# Patient Record
Sex: Male | Born: 1955 | Hispanic: No | Smoking: Current some day smoker
Health system: Southern US, Community
[De-identification: ages and names within clinical notes are randomized; demographics above are authoritative.]

## PROBLEM LIST (undated history)

## (undated) DIAGNOSIS — N189 Chronic kidney disease, unspecified: Secondary | ICD-10-CM

## (undated) DIAGNOSIS — C19 Malignant neoplasm of rectosigmoid junction: Secondary | ICD-10-CM

## (undated) DIAGNOSIS — Z87442 Personal history of urinary calculi: Secondary | ICD-10-CM

## (undated) HISTORY — DX: Chronic kidney disease, unspecified: N18.9

## (undated) HISTORY — PX: COLONOSCOPY: SHX174

---

## 2021-01-16 ENCOUNTER — Inpatient Hospital Stay (HOSPITAL_COMMUNITY)
Admission: EM | Admit: 2021-01-16 | Discharge: 2021-01-23 | DRG: 329 | Disposition: A | Discharge status: Home Health | Payer: Self-pay | Attending: Surgery | Admitting: Surgery

## 2021-01-16 ENCOUNTER — Emergency Department (HOSPITAL_COMMUNITY): Payer: Self-pay

## 2021-01-16 ENCOUNTER — Other Ambulatory Visit: Payer: Self-pay

## 2021-01-16 DIAGNOSIS — R030 Elevated blood-pressure reading, without diagnosis of hypertension: Secondary | ICD-10-CM

## 2021-01-16 DIAGNOSIS — K6389 Other specified diseases of intestine: Secondary | ICD-10-CM

## 2021-01-16 DIAGNOSIS — F17210 Nicotine dependence, cigarettes, uncomplicated: Secondary | ICD-10-CM | POA: Diagnosis present

## 2021-01-16 DIAGNOSIS — D72829 Elevated white blood cell count, unspecified: Secondary | ICD-10-CM

## 2021-01-16 DIAGNOSIS — Z20822 Contact with and (suspected) exposure to covid-19: Secondary | ICD-10-CM | POA: Diagnosis present

## 2021-01-16 DIAGNOSIS — K631 Perforation of intestine (nontraumatic): Secondary | ICD-10-CM | POA: Diagnosis present

## 2021-01-16 DIAGNOSIS — I10 Essential (primary) hypertension: Secondary | ICD-10-CM | POA: Diagnosis present

## 2021-01-16 DIAGNOSIS — N3289 Other specified disorders of bladder: Secondary | ICD-10-CM

## 2021-01-16 DIAGNOSIS — K760 Fatty (change of) liver, not elsewhere classified: Secondary | ICD-10-CM | POA: Diagnosis present

## 2021-01-16 DIAGNOSIS — R599 Enlarged lymph nodes, unspecified: Secondary | ICD-10-CM | POA: Diagnosis present

## 2021-01-16 DIAGNOSIS — E860 Dehydration: Secondary | ICD-10-CM | POA: Diagnosis present

## 2021-01-16 DIAGNOSIS — E876 Hypokalemia: Secondary | ICD-10-CM | POA: Diagnosis present

## 2021-01-16 DIAGNOSIS — R3129 Other microscopic hematuria: Secondary | ICD-10-CM | POA: Diagnosis present

## 2021-01-16 DIAGNOSIS — C19 Malignant neoplasm of rectosigmoid junction: Principal | ICD-10-CM | POA: Diagnosis present

## 2021-01-16 DIAGNOSIS — K5900 Constipation, unspecified: Secondary | ICD-10-CM | POA: Diagnosis present

## 2021-01-16 DIAGNOSIS — K651 Peritoneal abscess: Secondary | ICD-10-CM

## 2021-01-16 LAB — URINALYSIS, ROUTINE W REFLEX MICROSCOPIC
Bilirubin Urine: NEGATIVE
Glucose, UA: NEGATIVE mg/dL
Ketones, ur: 5 mg/dL — AB
Leukocytes,Ua: NEGATIVE
Nitrite: NEGATIVE
Protein, ur: NEGATIVE mg/dL
Specific Gravity, Urine: >1.046 — ABNORMAL HIGH (ref 1.005–1.030)
pH: 5 (ref 5.0–8.0)

## 2021-01-16 LAB — CBC WITH DIFFERENTIAL/PLATELET
Abs Immature Granulocytes: 0.05 10*3/uL (ref 0.00–0.07)
Basophils Absolute: 0 10*3/uL (ref 0.0–0.1)
Basophils Relative: 0 %
Eosinophils Absolute: 0.1 10*3/uL (ref 0.0–0.5)
Eosinophils Relative: 0 %
HCT: 35.1 % — ABNORMAL LOW (ref 39.0–52.0)
Hemoglobin: 11.3 g/dL — ABNORMAL LOW (ref 13.0–17.0)
Immature Granulocytes: 0 %
Lymphocytes Relative: 10 %
Lymphs Abs: 1.4 10*3/uL (ref 0.7–4.0)
MCH: 27.7 pg (ref 26.0–34.0)
MCHC: 32.2 g/dL (ref 30.0–36.0)
MCV: 86 fL (ref 80.0–100.0)
Monocytes Absolute: 0.7 10*3/uL (ref 0.1–1.0)
Monocytes Relative: 5 %
Neutro Abs: 11.5 10*3/uL — ABNORMAL HIGH (ref 1.7–7.7)
Neutrophils Relative %: 85 %
Platelets: 373 10*3/uL (ref 150–400)
RBC: 4.08 MIL/uL — ABNORMAL LOW (ref 4.22–5.81)
RDW: 12.5 % (ref 11.5–15.5)
WBC: 13.7 10*3/uL — ABNORMAL HIGH (ref 4.0–10.5)
nRBC: 0 % (ref 0.0–0.2)

## 2021-01-16 LAB — COMPREHENSIVE METABOLIC PANEL
ALT: 11 U/L (ref 0–44)
AST: 13 U/L — ABNORMAL LOW (ref 15–41)
Albumin: 3 g/dL — ABNORMAL LOW (ref 3.5–5.0)
Alkaline Phosphatase: 68 U/L (ref 38–126)
Anion gap: 12 (ref 5–15)
BUN: 14 mg/dL (ref 8–23)
CO2: 24 mmol/L (ref 22–32)
Calcium: 9.1 mg/dL (ref 8.9–10.3)
Chloride: 100 mmol/L (ref 98–111)
Creatinine, Ser: 1.06 mg/dL (ref 0.61–1.24)
GFR, Estimated: >60 mL/min (ref >60)
Glucose, Bld: 97 mg/dL (ref 70–99)
Potassium: 3.8 mmol/L (ref 3.5–5.1)
Sodium: 136 mmol/L (ref 135–145)
Total Bilirubin: 0.4 mg/dL (ref 0.3–1.2)
Total Protein: 6.7 g/dL (ref 6.5–8.1)

## 2021-01-16 LAB — LACTIC ACID, PLASMA
Lactic Acid, Venous: 0.8 mmol/L (ref 0.5–1.9)
Lactic Acid, Venous: 1.8 mmol/L (ref 0.5–1.9)
Lactic Acid, Venous: 0.9 mmol/L (ref 0.5–1.9)

## 2021-01-16 LAB — LIPASE, BLOOD: Lipase: 20 U/L (ref 11–51)

## 2021-01-16 MED ORDER — IOHEXOL 300 MG/ML  SOLN
100.0000 mL | Freq: Once | INTRAMUSCULAR | Status: AC | PRN
  Administered 2021-01-16: 100 mL via INTRAVENOUS

## 2021-01-16 MED ORDER — SODIUM CHLORIDE 0.9 % IV SOLN: INTRAVENOUS | Status: DC

## 2021-01-16 MED ORDER — PIPERACILLIN-TAZOBACTAM 3.375 G IVPB
3.3750 g | Freq: Three times a day (TID) | INTRAVENOUS | Status: DC
  Administered 2021-01-16 – 2021-01-23 (×21): 3.375 g via INTRAVENOUS
  Filled 2021-01-16 (×21): qty 50

## 2021-01-16 NOTE — ED Notes (Signed)
Pt ambulated to restroom without need of assistance and without difficulty. BM x1

## 2021-01-16 NOTE — ED Notes (Signed)
Patient transported to CT 

## 2021-01-16 NOTE — ED Provider Notes (Signed)
Emergency Medicine Provider Triage Evaluation Note  Curtis [name] , a 65 y.o. male  was evaluated in triage.  Pt complains of constipation x3-4 weeks.  On my examination, he reports that he is still able to pass stool, but is very loose and watery.  He states that he has been taking mag citrate and other stool softeners.  He urinates sitting down, so sometimes it is difficult for him to differentiate between whether or not he is urinating or passing loose stool.  He denies any fevers or chills, nausea or vomiting, or other symptoms.  He can still pas gas.  He has never had a colonoscopy.  He had plain films obtained yesterday and was encouraged to get a CT, but states that it interfered with time with his children.  Review of Systems  Positive: Constipation Negative: Fevers, N/V, history of malignancy.  Physical Exam  BP (!) 147/111   Pulse (!) 101   Temp 98.3 F (36.8 C) (Oral)   Resp 16   SpO2 99%  Gen:   Awake, no distress   Resp:  Normal effort  MSK:   Moves extremities without difficulty  Other:  Abdomen is soft.  Medical Decision Making  Medically screening exam initiated at 2:50 PM.  Appropriate orders placed.  Curtis [name] was informed that the remainder of the evaluation will be completed by another provider, this initial triage assessment does not replace that evaluation, and the importance of remaining in the ED until their evaluation is complete.    Corena Herter, PA-C 01/16/21 1450    Quintella Reichert, MD 01/16/21 [phone]

## 2021-01-16 NOTE — ED Notes (Signed)
Attempted to call report, advised by 6N that they will call back shortly. Call back number provided.

## 2021-01-16 NOTE — ED Provider Notes (Signed)
[facility] EMERGENCY DEPARTMENT Provider Note   CSN: [id number] Arrival date & time: 01/16/21  1440     History No chief complaint on file.   Curtis [name] is a 65 y.o. male.  Presents to ER with concern for constipation, abdominal pain.  Patient reports over the past few weeks he has been having constipation, states that he is having hard time having regular bowel movements.  Is having multiple frequent loose small watery stools.  Has tried mag citrate and some other stool softeners without significant improvement.  No pain with urination, is having a harder time urinating while standing but has no difficulty urinating while sitting.  Has not had any CT imaging of his abdomen since having this concern.  Does have some intermittent abdominal discomfort but no pain at present.    HPI     No past medical history on file.  Patient Active Problem List   Diagnosis Date Noted   Bowel perforation ([place]) 01/16/2021   Intra-abdominal abscess ([place]) 01/16/2021   Elevated blood pressure reading 01/16/2021   Leukocytosis 01/16/2021   Hepatic steatosis 01/16/2021   Bladder wall thickening 01/16/2021   Microscopic hematuria 01/16/2021    No family history on file.     Home Medications Prior to Admission medications   Medication Sig Start Date End Date Taking? Authorizing Provider  acetaminophen (TYLENOL) 500 MG tablet Take 1,000 mg by mouth daily.   Yes [provider]    Allergies    Patient has no known allergies.  Review of Systems   Review of Systems  Constitutional:  Negative for chills and fever.  HENT:  Negative for ear pain and sore throat.   Eyes:  Negative for pain and visual disturbance.  Respiratory:  Negative for cough and shortness of breath.   Cardiovascular:  Negative for chest pain and palpitations.  Gastrointestinal:  Positive for abdominal distention, abdominal pain, constipation and diarrhea. Negative for vomiting.  Genitourinary:   Negative for dysuria and hematuria.  Musculoskeletal:  Negative for arthralgias and back pain.  Skin:  Negative for color change and rash.  Neurological:  Negative for seizures and syncope.  All other systems reviewed and are negative.  Physical Exam Updated Vital Signs BP (!) 151/81   Pulse 85   Temp 99.3 F (37.4 C) (Oral)   Resp (!) 24   Ht 5\' 10"  (1.778 m)   Wt 91.2 kg   SpO2 96%   BMI 28.84 kg/m   Physical Exam Vitals and nursing note reviewed.  Constitutional:      Appearance: He is well-developed.  HENT:     Head: Normocephalic and atraumatic.  Eyes:     Conjunctiva/sclera: Conjunctivae normal.  Cardiovascular:     Rate and Rhythm: Normal rate and regular rhythm.     Heart sounds: No murmur heard. Pulmonary:     Effort: Pulmonary effort is normal. No respiratory distress.     Breath sounds: Normal breath sounds.  Abdominal:     Palpations: Abdomen is soft.     Comments: Mild tenderness in the right lower and left lower quadrants  Musculoskeletal:     Cervical back: Neck supple.  Skin:    General: Skin is warm and dry.  Neurological:     General: No focal deficit present.     Mental Status: He is alert.  Psychiatric:        Mood and Affect: Mood normal.        Behavior: Behavior normal.  ED Results / Procedures / Treatments   Labs (all labs ordered are listed, but only abnormal results are displayed) Labs Reviewed  CBC WITH DIFFERENTIAL/PLATELET - Abnormal; Notable for the following components:      Result Value   WBC 13.7 (*)    RBC 4.08 (*)    Hemoglobin 11.3 (*)    HCT 35.1 (*)    Neutro Abs 11.5 (*)    All other components within normal limits  COMPREHENSIVE METABOLIC PANEL - Abnormal; Notable for the following components:   Albumin 3.0 (*)    AST 13 (*)    All other components within normal limits  URINALYSIS, ROUTINE W REFLEX MICROSCOPIC - Abnormal; Notable for the following components:   Specific Gravity, Urine >1.046 (*)    Hgb urine  dipstick SMALL (*)    Ketones, ur 5 (*)    Bacteria, UA RARE (*)    All other components within normal limits  SARS CORONAVIRUS 2 (TAT 6-24 HRS)  URINE CULTURE  LIPASE, BLOOD  LACTIC ACID, PLASMA  LACTIC ACID, PLASMA    EKG None  Radiology CT ABDOMEN PELVIS W CONTRAST  Result Date: 01/16/2021 CLINICAL DATA:  Abdominal pain EXAM: CT ABDOMEN AND PELVIS WITH CONTRAST TECHNIQUE: Multidetector CT imaging of the abdomen and pelvis was performed using the standard protocol following bolus administration of intravenous contrast. CONTRAST:  144mL OMNIPAQUE IOHEXOL 300 MG/ML  SOLN COMPARISON:  None. FINDINGS: Lower chest: Linear scarring atelectasis in the posterior right lung base. Lung bases otherwise clear. Normal heart size. No pericardial effusion. Hepatobiliary: Diffuse hepatic hypoattenuation compatible with hepatic steatosis. Smooth surface contour. No concerning focal liver lesion. Focal hypoattenuation along the falciform ligament likely focal fatty infiltration. Normal gallbladder and biliary tree without visible calcified gallstones or ductal dilatation. Pancreas: No pancreatic ductal dilatation or surrounding inflammatory changes. Spleen: Normal in size. No concerning splenic lesions. Adrenals/Urinary Tract: Normal adrenal glands. Kidneys are normally located with symmetric enhancement and excretion. No suspicious renal lesion,. Punctate nonobstructing calculi are seen bilaterally. Some mild asymmetric thickening of the posterior bladder wall is favored to be reactive to the inflammatory process in the pelvis detailed below. Stomach/Bowel: Distal esophagus, stomach and duodenal sweep are unremarkable. No small bowel wall thickening or dilatation. No evidence of obstruction. A normal appendix is visualized. Proximal colon is unremarkable. There are distal colonic diverticula. There is an irregularly thickened and narrowed appearance of the rectosigmoid with surrounding multiloculated rim enhancing  fluid collection extending towards and along the right pelvic sidewall with maximum conglomerate dimensions of 5.5 x 6.0 x 8.2 cm in transverse by AP by craniocaudal dimensions. Admixed air and fluid contained within this heterogeneous collection. Extensive surrounding phlegmon and inflammatory change. Fluid-filled appearance of the rectum. Vascular/Lymphatic: Atherosclerotic calcifications within the abdominal aorta and branch vessels. No aneurysm or ectasia. Few scattered prominent nodes are seen in the pelvis, possibly reactive. No other conspicuous or pathologically enlarged nodes in the abdomen or pelvis. Reproductive: Stranding and thickening from the rectosigmoid process above extends to the seminal vesicles. Few eccentric calcifications of the prostate. Other: Air and fluid containing collections extending along the rectosigmoid and right pelvic sidewall, as above. Surrounding fluid and phlegmon. No other free air or fluid is seen. No bowel containing hernia. Bilateral fat containing inguinal hernias. Musculoskeletal: Multilevel degenerative changes are present in the imaged portions of the spine. Additional degenerative changes in the hips and pelvis. No acute osseous abnormality or suspicious osseous lesion. IMPRESSION: 1. Markedly irregular segmental thickening narrowing of the rectosigmoid  colon with extensive surrounding phlegmon as well as a large air and fluid containing collection extending towards and along the pelvic sidewall. While this could reflect a perforated diverticulitis with abscess formation appearance is highly worrisome for an underlying mass lesion with perforation given the irregularity of the wall thickening. Surrounding adenopathy may be reactive or metastatic. 2. Suspect reactive thickening of the posterior bladder wall and seminal vesicles. Could correlate with urinalysis as warranted. 3. Hepatic steatosis 4.  Aortic Atherosclerosis (ICD10-I70.0). Electronically Signed   By: Lovena Le M.D.   On: 01/16/2021 19:06    Procedures Procedures   Medications Ordered in ED Medications  piperacillin-tazobactam (ZOSYN) IVPB 3.375 g (3.375 g Intravenous New Bag/Given 01/16/21 2004)  iohexol (OMNIPAQUE) 300 MG/ML solution 100 mL (100 mLs Intravenous Contrast Given 01/16/21 1828)    ED Course  I have reviewed the triage vital signs and the nursing notes.  Pertinent labs & imaging results that were available during my care of the patient were reviewed by me and considered in my medical decision making (see chart for details).    MDM Rules/Calculators/A&P                          65 year old male presenting to ER with concern for abdominal pain.  On exam well-appearing but did note some tenderness in lower abdomen.  Labs noted for leukocytosis.  CT scan concerning for possible bowel perforation, perforated diverticulitis with abscess formation versus mass lesion with perforation.  Discussed with general surgery, Dr. Grandville Silos.  Agrees with antibiotics and medicine admission.  He will see as consult..  Discussed with Dr. Juleen China.  She will admit as primary.  Started Zosyn for now.   Final Clinical Impression(s) / ED Diagnoses Final diagnoses:  Bowel perforation ([place])  Intra-abdominal abscess [facility])    Rx / DC Orders ED Discharge Orders     None        Lucrezia Starch, MD 01/16/21 2057

## 2021-01-16 NOTE — H&P (Signed)
History and Physical        Hospital Admission Note Date: 01/16/2021  Patient name: Curtis [name] Medical record number: [id number] Date of birth: [birth year] Age: 65 y.o. Gender: male  PCP: Pcp, No    Patient coming from: Home   I have reviewed all records in the [facility].    Chief Complaint:  Constipation, Abdominal Pain   HPI: Curtis [name] is a 65 y.o. male with reportedly no significant PMH who presents for constipation, abdominal pain. This has been occurring for several weeks. Pain has been intermittent and generalized, vague. Has tried mag citrate and some other stool softeners without significant improvement. Has also been having periods of frequent loose small watery stools. Has not been seen for this complaint. No imaging has been done. No history of diverticulosis. Has never had a colonoscopy. Afebrile.    ED work-up/course:    65 year old male presenting to ER with concern for abdominal pain.  On exam well-appearing but did note some tenderness in lower abdomen.  Labs noted for leukocytosis.  CT scan concerning for possible bowel perforation, perforated diverticulitis with abscess formation versus mass lesion with perforation.  Discussed with general surgery, Dr. Grandville Silos.  Agrees with antibiotics and medicine admission.  He will see as consult..  Discussed with Dr. Juleen China.  She will admit as primary.  Started Zosyn for now.  Review of Systems: Positives marked in 'bold' Constitutional: Denies fever, chills, diaphoresis, poor appetite and fatigue.  HEENT: Denies photophobia, eye pain, redness, hearing loss, ear pain, congestion, sore throat, rhinorrhea, sneezing, mouth sores, trouble swallowing, neck pain, neck stiffness and tinnitus.   Respiratory: Denies SOB, DOE, cough, chest tightness,  and wheezing.   Cardiovascular: Denies chest pain, palpitations and leg swelling.  Gastrointestinal: Denies nausea,  vomiting, abdominal pain, diarrhea, constipation, blood in stool and abdominal distention.  Genitourinary: Denies dysuria, urgency, frequency, hematuria, flank pain and difficulty urinating.  Musculoskeletal: Denies myalgias, back pain, joint swelling, arthralgias and gait problem.  Skin: Denies pallor, rash and wound.  Neurological: Denies dizziness, seizures, syncope, weakness, light-headedness, numbness and headaches.  Hematological: Denies adenopathy. Easy bruising, personal or family bleeding history  Psychiatric/Behavioral: Denies suicidal ideation, mood changes, confusion, nervousness, sleep disturbance and agitation  Past Medical History: No past medical history on file.   Medications: Prior to Admission medications   Medication Sig Start Date End Date Taking? Authorizing Provider  acetaminophen (TYLENOL) 500 MG tablet Take 1,000 mg by mouth daily.   Yes [provider]    Allergies:  No Known Allergies  Social History:  has no history on file for tobacco use, alcohol use, and drug use.  Family History: No family history on file.  Physical Exam: Blood pressure (!) 151/81, pulse 85, temperature 99.3 F (37.4 C), temperature source Oral, resp. rate (!) 24, height 5\' 10"  (1.778 m), weight 91.2 kg, SpO2 96 %. General: Alert, awake, oriented x3, in no acute distress. Eyes: pink conjunctiva,anicteric sclera, pupils equal and reactive to light and accomodation, HEENT: normocephalic, atraumatic, oropharynx clear Neck: supple, no masses or lymphadenopathy, no goiter, no bruits, no JVD CVS: Regular rate and rhythm, without murmurs, rubs or gallops. No lower extremity edema Resp :  Clear to auscultation bilaterally, no wheezing, rales or rhonchi. GI : Soft, mild tenderness in lower abdomen. No rebound or guarding.  Musculoskeletal: No clubbing or cyanosis, positive pedal pulses. No contracture. ROM intact  Neuro: Grossly intact, no focal neurological deficits, strength 5/5  upper and lower extremities bilaterally Psych: alert and oriented x 3, normal mood and affect Skin: no rashes or lesions, warm and dry   LABS on Admission: I have personally reviewed all the labs and imagings below    Basic Metabolic Panel: Recent Labs  Lab 01/16/21 1444  NA 136  K 3.8  CL 100  CO2 24  GLUCOSE 97  BUN 14  CREATININE 1.06  CALCIUM 9.1   Liver Function Tests: Recent Labs  Lab 01/16/21 1444  AST 13*  ALT 11  ALKPHOS 68  BILITOT 0.4  PROT 6.7  ALBUMIN 3.0*   Recent Labs  Lab 01/16/21 1515  LIPASE 20   No results for input(s): AMMONIA in the last 168 hours. CBC: Recent Labs  Lab 01/16/21 1444  WBC 13.7*  NEUTROABS 11.5*  HGB 11.3*  HCT 35.1*  MCV 86.0  PLT 373   Cardiac Enzymes: No results for input(s): CKTOTAL, CKMB, CKMBINDEX, TROPONINI in the last 168 hours. BNP: Invalid input(s): POCBNP CBG: No results for input(s): GLUCAP in the last 168 hours.  Radiological Exams on Admission:  CT ABDOMEN PELVIS W CONTRAST  Result Date: 01/16/2021 CLINICAL DATA:  Abdominal pain EXAM: CT ABDOMEN AND PELVIS WITH CONTRAST TECHNIQUE: Multidetector CT imaging of the abdomen and pelvis was performed using the standard protocol following bolus administration of intravenous contrast. CONTRAST:  162mL OMNIPAQUE IOHEXOL 300 MG/ML  SOLN COMPARISON:  None. FINDINGS: Lower chest: Linear scarring atelectasis in the posterior right lung base. Lung bases otherwise clear. Normal heart size. No pericardial effusion. Hepatobiliary: Diffuse hepatic hypoattenuation compatible with hepatic steatosis. Smooth surface contour. No concerning focal liver lesion. Focal hypoattenuation along the falciform ligament likely focal fatty infiltration. Normal gallbladder and biliary tree without visible calcified gallstones or ductal dilatation. Pancreas: No pancreatic ductal dilatation or surrounding inflammatory changes. Spleen: Normal in size. No concerning splenic lesions.  Adrenals/Urinary Tract: Normal adrenal glands. Kidneys are normally located with symmetric enhancement and excretion. No suspicious renal lesion,. Punctate nonobstructing calculi are seen bilaterally. Some mild asymmetric thickening of the posterior bladder wall is favored to be reactive to the inflammatory process in the pelvis detailed below. Stomach/Bowel: Distal esophagus, stomach and duodenal sweep are unremarkable. No small bowel wall thickening or dilatation. No evidence of obstruction. A normal appendix is visualized. Proximal colon is unremarkable. There are distal colonic diverticula. There is an irregularly thickened and narrowed appearance of the rectosigmoid with surrounding multiloculated rim enhancing fluid collection extending towards and along the right pelvic sidewall with maximum conglomerate dimensions of 5.5 x 6.0 x 8.2 cm in transverse by AP by craniocaudal dimensions. Admixed air and fluid contained within this heterogeneous collection. Extensive surrounding phlegmon and inflammatory change. Fluid-filled appearance of the rectum. Vascular/Lymphatic: Atherosclerotic calcifications within the abdominal aorta and branch vessels. No aneurysm or ectasia. Few scattered prominent nodes are seen in the pelvis, possibly reactive. No other conspicuous or pathologically enlarged nodes in the abdomen or pelvis. Reproductive: Stranding and thickening from the rectosigmoid process above extends to the seminal vesicles. Few eccentric calcifications of the prostate. Other: Air and fluid containing collections extending along the rectosigmoid and right pelvic sidewall, as above. Surrounding fluid and phlegmon. No other free air or fluid is seen. No bowel containing hernia.  Bilateral fat containing inguinal hernias. Musculoskeletal: Multilevel degenerative changes are present in the imaged portions of the spine. Additional degenerative changes in the hips and pelvis. No acute osseous abnormality or suspicious  osseous lesion. IMPRESSION: 1. Markedly irregular segmental thickening narrowing of the rectosigmoid colon with extensive surrounding phlegmon as well as a large air and fluid containing collection extending towards and along the pelvic sidewall. While this could reflect a perforated diverticulitis with abscess formation appearance is highly worrisome for an underlying mass lesion with perforation given the irregularity of the wall thickening. Surrounding adenopathy may be reactive or metastatic. 2. Suspect reactive thickening of the posterior bladder wall and seminal vesicles. Could correlate with urinalysis as warranted. 3. Hepatic steatosis 4.  Aortic Atherosclerosis (ICD10-I70.0). Electronically Signed   By: Lovena Le M.D.   On: 01/16/2021 19:06     Assessment/Plan Active Problems:   Bowel perforation ([facility])   Intra-abdominal abscess ([facility])   Elevated blood pressure reading   Leukocytosis   Hepatic steatosis   Bladder wall thickening   Microscopic hematuria  Bowel Perforation with Intra-Abdominal Abscess versus Underlying Mass Lesion  CT abdomen read: Markedly irregular segmental thickening narrowing of the rectosigmoid colon with extensive surrounding phlegmon as well as a large air and fluid containing collection extending towards and along the pelvic sidewall. While this could reflect a perforated diverticulitis with abscess formation appearance is highly worrisome for an underlying mass lesion with perforation given the irregularity of the wall thickening. Surrounding adenopathy may be reactive or metastatic. Patient is hemodynamically stable. Mild leukocytosis to 13. Reports no history of bowel disease. No history of malignancy.  -admit to [facility] -continue Zosyn ordered by ED provider  -Gen Surg has been consulted, awaiting recs...may warrant IR involvement  -obtain lactic acid  -monitor CBC  -NS at 125 cc/hr   Bladder Wall Thickening/Microscopic Hematuria  Noted on CT abdomen  thought to be reactive thickening. Patient denies any urinary concerns other than difficulty voiding while standing. UA appears to have evidence of mild dehydration. Also has rare bacteria, 6-10 RBCs. Will investigate further for underlying infection. This may be related to undiagnosed BPH. May have some degree of bleeding related to reaction to above findings. No bladder masses noted on imaging.  -will obtain urine culture   Elevated BP without diagnosis of HTN  Denies h/o elevated BP. Anxiety over diagnosis and admission could certainly be contributing.  -will need to monitor  -not currently in range where would need medications to lower immediately    Hepatic Steatosis  Incidental finding on imaging. LFTs normal.  -outpatient management regarding diet and lifestyle changes     DVT prophylaxis: SCDs pending plan about any possible procedure   CODE STATUS: FULL   Consults called: Gen Surg   Admission status: Inpatient   The medical decision making on this patient was of high complexity and the patient is at high risk for clinical deterioration, therefore this is a level 3 admission.  Severity of Illness:      The appropriate patient status for this patient is INPATIENT. Inpatient status is judged to be reasonable and necessary in order to provide the required intensity of service to ensure the patient's safety. The patient's presenting symptoms, physical exam findings, and initial radiographic and laboratory data in the context of their chronic comorbidities is felt to place them at high risk for further clinical deterioration. Furthermore, it is not anticipated that the patient will be medically stable for discharge from the hospital within 2 midnights  of admission. The following factors support the patient status of inpatient.   " The patient's presenting symptoms include abdominal pain, diarrhea, constipation. " The worrisome physical exam findings include TTP to lower  abdomen. " The initial radiographic and laboratory data are worrisome because of CT abdomen with bowel perforation with underlying abscess or possible mass, leukocytosis. " The chronic co-morbidities include none.   * I certify that at the point of admission it is my clinical judgment that the patient will require inpatient hospital care spanning beyond 2 midnights from the point of admission due to high intensity of service, high risk for further deterioration and high frequency of surveillance required.*   Time Spent on Admission: 42 minutes      Melina Schools D.O.  [facility] 01/16/2021, 9:04 PM

## 2021-01-16 NOTE — Consult Note (Signed)
Reason for Consult:colon perforation, possible mass Referring Physician: [name] is an 65 y.o. male.  HPI: 66yo M presented to the ED complaining of 3-week history of obstipation.  He has been having frequent small liquid bowel movements but only occasional regular bowel movements.  No blood in his stool.  No significant abdominal pain.  He has never had a colonoscopy.  Work-up in emergency department showed leukocytosis of 13,700.  CT scan of the abdomen and pelvis shows sigmoid colon perforation with abscess.  This possibly represents a perforated tumor.  We are asked to see him for surgical management.  No past medical history on file.  No family history on file.  Social History:  has no history on file for tobacco use, alcohol use, and drug use.  Allergies: No Known Allergies  Medications: I have reviewed the patient's current medications.  Results for orders placed or performed during the hospital encounter of 01/16/21 (from the past 48 hour(s))  CBC with Differential     Status: Abnormal   Collection Time: 01/16/21  2:44 PM  Result Value Ref Range   WBC 13.7 (H) 4.0 - 10.5 K/uL   RBC 4.08 (L) 4.22 - 5.81 MIL/uL   Hemoglobin 11.3 (L) 13.0 - 17.0 g/dL   HCT 35.1 (L) 39.0 - 52.0 %   MCV 86.0 80.0 - 100.0 fL   MCH 27.7 26.0 - 34.0 pg   MCHC 32.2 30.0 - 36.0 g/dL   RDW 12.5 11.5 - 15.5 %   Platelets 373 150 - 400 K/uL   nRBC 0.0 0.0 - 0.2 %   Neutrophils Relative % 85 %   Neutro Abs 11.5 (H) 1.7 - 7.7 K/uL   Lymphocytes Relative 10 %   Lymphs Abs 1.4 0.7 - 4.0 K/uL   Monocytes Relative 5 %   Monocytes Absolute 0.7 0.1 - 1.0 K/uL   Eosinophils Relative 0 %   Eosinophils Absolute 0.1 0.0 - 0.5 K/uL   Basophils Relative 0 %   Basophils Absolute 0.0 0.0 - 0.1 K/uL   Immature Granulocytes 0 %   Abs Immature Granulocytes 0.05 0.00 - 0.07 K/uL    Comment: Performed at [facility], [address] [place], [place] [address]  Comprehensive metabolic panel      Status: Abnormal   Collection Time: 01/16/21  2:44 PM  Result Value Ref Range   Sodium 136 135 - 145 mmol/L   Potassium 3.8 3.5 - 5.1 mmol/L   Chloride 100 98 - 111 mmol/L   CO2 24 22 - 32 mmol/L   Glucose, Bld 97 70 - 99 mg/dL    Comment: Glucose reference range applies only to samples taken after fasting for at least 8 hours.   BUN 14 8 - 23 mg/dL   Creatinine, Ser 1.06 0.61 - 1.24 mg/dL   Calcium 9.1 8.9 - 10.3 mg/dL   Total Protein 6.7 6.5 - 8.1 g/dL   Albumin 3.0 (L) 3.5 - 5.0 g/dL   AST 13 (L) 15 - 41 U/L   ALT 11 0 - 44 U/L   Alkaline Phosphatase 68 38 - 126 U/L   Total Bilirubin 0.4 0.3 - 1.2 mg/dL   GFR, Estimated >60 >60 mL/min    Comment: (NOTE) Calculated using the CKD-EPI Creatinine Equation (2021)    Anion gap 12 5 - 15    Comment: Performed at [facility] [address], [place], [place] [address]  Lipase, blood     Status:  None   Collection Time: 01/16/21  3:15 PM  Result Value Ref Range   Lipase 20 11 - 51 U/L    Comment: Performed at [facility], [place] [address] [place], [place] [address]  Urinalysis, Routine w reflex microscopic Urine, Clean Catch     Status: Abnormal   Collection Time: 01/16/21  6:55 PM  Result Value Ref Range   Color, Urine YELLOW YELLOW   APPearance CLEAR CLEAR   Specific Gravity, Urine >1.046 (H) 1.005 - 1.030   pH 5.0 5.0 - 8.0   Glucose, UA NEGATIVE NEGATIVE mg/dL   Hgb urine dipstick SMALL (A) NEGATIVE   Bilirubin Urine NEGATIVE NEGATIVE   Ketones, ur 5 (A) NEGATIVE mg/dL   Protein, ur NEGATIVE NEGATIVE mg/dL   Nitrite NEGATIVE NEGATIVE   Leukocytes,Ua NEGATIVE NEGATIVE   RBC / HPF 6-10 0 - 5 RBC/hpf   WBC, UA 0-5 0 - 5 WBC/hpf   Bacteria, UA RARE (A) NONE SEEN   Squamous Epithelial / LPF 0-5 0 - 5   Mucus PRESENT     Comment: Performed at [facility], [address] [place], [place] [address]  Lactic acid, plasma     Status: None   Collection Time: 01/16/21  7:49 PM  Result Value Ref Range   Lactic  Acid, Venous 0.8 0.5 - 1.9 mmol/L    Comment: Performed at [facility] [address], [place], [place] [address]    CT ABDOMEN PELVIS W CONTRAST  Result Date: 01/16/2021 CLINICAL DATA:  Abdominal pain EXAM: CT ABDOMEN AND PELVIS WITH CONTRAST TECHNIQUE: Multidetector CT imaging of the abdomen and pelvis was performed using the standard protocol following bolus administration of intravenous contrast. CONTRAST:  13mL OMNIPAQUE IOHEXOL 300 MG/ML  SOLN COMPARISON:  None. FINDINGS: Lower chest: Linear scarring atelectasis in the posterior right lung base. Lung bases otherwise clear. Normal heart size. No pericardial effusion. Hepatobiliary: Diffuse hepatic hypoattenuation compatible with hepatic steatosis. Smooth surface contour. No concerning focal liver lesion. Focal hypoattenuation along the falciform ligament likely focal fatty infiltration. Normal gallbladder and biliary tree without visible calcified gallstones or ductal dilatation. Pancreas: No pancreatic ductal dilatation or surrounding inflammatory changes. Spleen: Normal in size. No concerning splenic lesions. Adrenals/Urinary Tract: Normal adrenal glands. Kidneys are normally located with symmetric enhancement and excretion. No suspicious renal lesion,. Punctate nonobstructing calculi are seen bilaterally. Some mild asymmetric thickening of the posterior bladder wall is favored to be reactive to the inflammatory process in the pelvis detailed below. Stomach/Bowel: Distal esophagus, stomach and duodenal sweep are unremarkable. No small bowel wall thickening or dilatation. No evidence of obstruction. A normal appendix is visualized. Proximal colon is unremarkable. There are distal colonic diverticula. There is an irregularly thickened and narrowed appearance of the rectosigmoid with surrounding multiloculated rim enhancing fluid collection extending towards and along the right pelvic sidewall with maximum conglomerate dimensions of 5.5 x 6.0 x  8.2 cm in transverse by AP by craniocaudal dimensions. Admixed air and fluid contained within this heterogeneous collection. Extensive surrounding phlegmon and inflammatory change. Fluid-filled appearance of the rectum. Vascular/Lymphatic: Atherosclerotic calcifications within the abdominal aorta and branch vessels. No aneurysm or ectasia. Few scattered prominent nodes are seen in the pelvis, possibly reactive. No other conspicuous or pathologically enlarged nodes in the abdomen or pelvis. Reproductive: Stranding and thickening from the rectosigmoid process above extends to the seminal vesicles. Few eccentric calcifications of the prostate. Other: Air and fluid containing collections extending along the rectosigmoid and right  pelvic sidewall, as above. Surrounding fluid and phlegmon. No other free air or fluid is seen. No bowel containing hernia. Bilateral fat containing inguinal hernias. Musculoskeletal: Multilevel degenerative changes are present in the imaged portions of the spine. Additional degenerative changes in the hips and pelvis. No acute osseous abnormality or suspicious osseous lesion. IMPRESSION: 1. Markedly irregular segmental thickening narrowing of the rectosigmoid colon with extensive surrounding phlegmon as well as a large air and fluid containing collection extending towards and along the pelvic sidewall. While this could reflect a perforated diverticulitis with abscess formation appearance is highly worrisome for an underlying mass lesion with perforation given the irregularity of the wall thickening. Surrounding adenopathy may be reactive or metastatic. 2. Suspect reactive thickening of the posterior bladder wall and seminal vesicles. Could correlate with urinalysis as warranted. 3. Hepatic steatosis 4.  Aortic Atherosclerosis (ICD10-I70.0). Electronically Signed   By: Lovena Le M.D.   On: 01/16/2021 19:06    Review of Systems  Constitutional:  Negative for activity change.  HENT:  Negative.    Eyes: Negative.   Respiratory: Negative.    Cardiovascular: Negative.   Gastrointestinal:  Positive for constipation and diarrhea. Negative for blood in stool, nausea and vomiting.  Endocrine: Negative.   Genitourinary: Negative.   Musculoskeletal: Negative.   Allergic/Immunologic: Negative.   Neurological: Negative.   Hematological: Negative.   Psychiatric/Behavioral: Negative.    Blood pressure (!) 156/92, pulse 84, temperature 98.2 F (36.8 C), temperature source Oral, resp. rate (!) 24, height 5\' 10"  (1.778 m), weight 91.2 kg, SpO2 100 %. Physical Exam Constitutional:      Appearance: Normal appearance.  HENT:     Head: Normocephalic and atraumatic.     Right Ear: External ear normal.     Left Ear: External ear normal.     Nose: Nose normal.  Eyes:     General: No scleral icterus.    Extraocular Movements: Extraocular movements intact.     Pupils: Pupils are equal, round, and reactive to light.  Cardiovascular:     Rate and Rhythm: Normal rate and regular rhythm.     Pulses: Normal pulses.     Heart sounds: Normal heart sounds.  Pulmonary:     Effort: Pulmonary effort is normal.     Breath sounds: Normal breath sounds. No wheezing or rhonchi.  Abdominal:     General: Abdomen is flat. There is no distension.     Palpations: Abdomen is soft.     Tenderness: There is abdominal tenderness. There is no guarding or rebound.     Comments: Some suprapubic tenderness but no peritonitis  Musculoskeletal:        General: No swelling. Normal range of motion.     Cervical back: Normal range of motion.  Skin:    General: Skin is warm and dry.     Capillary Refill: Capillary refill takes less than 2 seconds.  Neurological:     Mental Status: He is alert and oriented to person, place, and time.  Psychiatric:        Mood and Affect: Mood normal.    Assessment/Plan: Perforated sigmoid colon with abscess -this is either a perforated tumor or perforated diverticulitis.   We will check CEA and CA125.  Agree with Zosyn and bowel rest.  It is likely he will need surgery this admission but we will discuss further.  We will follow closely.  Curtis [name] 01/16/2021, 9:37 PM

## 2021-01-16 NOTE — ED Triage Notes (Signed)
Pt here from home with c/o abd pain and constipation time 3 to 4 weeks , has tried some otc meds with minimal relief

## 2021-01-17 ENCOUNTER — Encounter (HOSPITAL_COMMUNITY): Payer: Self-pay | Admitting: Internal Medicine

## 2021-01-17 LAB — CBC
HCT: 33.7 % — ABNORMAL LOW (ref 39.0–52.0)
Hemoglobin: 10.9 g/dL — ABNORMAL LOW (ref 13.0–17.0)
MCH: 28.2 pg (ref 26.0–34.0)
MCHC: 32.3 g/dL (ref 30.0–36.0)
MCV: 87.3 fL (ref 80.0–100.0)
Platelets: 408 10*3/uL — ABNORMAL HIGH (ref 150–400)
RBC: 3.86 MIL/uL — ABNORMAL LOW (ref 4.22–5.81)
RDW: 12.8 % (ref 11.5–15.5)
WBC: 14.1 10*3/uL — ABNORMAL HIGH (ref 4.0–10.5)
nRBC: 0 % (ref 0.0–0.2)

## 2021-01-17 LAB — HIV ANTIBODY (ROUTINE TESTING W REFLEX): HIV Screen 4th Generation wRfx: NONREACTIVE

## 2021-01-17 LAB — BASIC METABOLIC PANEL
Anion gap: 11 (ref 5–15)
BUN: 13 mg/dL (ref 8–23)
CO2: 23 mmol/L (ref 22–32)
Calcium: 8.8 mg/dL — ABNORMAL LOW (ref 8.9–10.3)
Chloride: 103 mmol/L (ref 98–111)
Creatinine, Ser: 0.98 mg/dL (ref 0.61–1.24)
GFR, Estimated: >60 mL/min (ref >60)
Glucose, Bld: 107 mg/dL — ABNORMAL HIGH (ref 70–99)
Potassium: 3.6 mmol/L (ref 3.5–5.1)
Sodium: 137 mmol/L (ref 135–145)

## 2021-01-17 LAB — SARS CORONAVIRUS 2 (TAT 6-24 HRS): SARS Coronavirus 2: NEGATIVE

## 2021-01-17 LAB — LACTIC ACID, PLASMA: Lactic Acid, Venous: 1.1 mmol/L (ref 0.5–1.9)

## 2021-01-17 MED ORDER — CLONIDINE HCL 0.1 MG PO TABS
0.1000 mg | ORAL_TABLET | Freq: Two times a day (BID) | ORAL | Status: DC | PRN
  Administered 2021-01-19: 0.1 mg via ORAL
  Filled 2021-01-17: qty 1

## 2021-01-17 MED ORDER — MORPHINE SULFATE (PF) 2 MG/ML IV SOLN
2.0000 mg | Freq: Once | INTRAVENOUS | Status: AC
  Administered 2021-01-17: 2 mg via INTRAVENOUS
  Filled 2021-01-17: qty 1

## 2021-01-17 NOTE — Hospital Course (Signed)
65 year old with no past medical history, never colonoscopy Constipation obstipation X 21 days or so-trialed various purgatives without improvement with intermittent minimal watery stools CT abdomen pelvis = intra-abdominal abscess question of underlying mass has been raised given irregularity of the wall thickening with surrounding adenopathy  Started on Zosyn-saline 125 cc/H General surgery consulted-feels this is perforated tumor/perforated diverticulitis and has ordered tumor panel to best determine next steps

## 2021-01-17 NOTE — Progress Notes (Signed)
Progress Note     Subjective: CC: Feeling better this AM.  No abdominal pain at rest.  Continues to have small liquid Bms today. He denies nausea or emesis  Objective: Vital signs in last 24 hours: Temp:  [98.2 F (36.8 C)-99.3 F (37.4 C)] 98.2 F (36.8 C) (06/25 0531) Pulse Rate:  [71-101] 82 (06/25 0531) Resp:  [14-26] 18 (06/25 0531) BP: (128-164)/(64-111) 156/69 (06/25 0531) SpO2:  [96 %-100 %] 99 % (06/25 0531) Weight:  [88.8 kg-91.2 kg] 88.8 kg (06/24 2117) Last BM Date: 01/16/21  Intake/Output from previous day: 06/24 0701 - 06/25 0700 In: 204.8 [I.V.:180.8; IV Piggyback:24] Out: 200 [Urine:200] Intake/Output this shift: No intake/output data recorded.  PE: General: pleasant, WD, male who is laying in bed in NAD HEENT: head is normocephalic, atraumatic.   Mouth is pink and moist Heart: regular, rate, and rhythm.  Palpable radial pulses bilaterally Lungs: Respiratory effort nonlabored Abd: soft, ND, +BS, mild TTP bilateral lower abdomen MS: all 4 extremities are symmetrical with no cyanosis, clubbing, or edema. Skin: warm and dry with no masses, lesions, or rashes Psych: A&Ox3 with an appropriate affect.    Lab Results:  Recent Labs    01/16/21 1444 01/17/21 0027  WBC 13.7* 14.1*  HGB 11.3* 10.9*  HCT 35.1* 33.7*  PLT 373 408*   BMET Recent Labs    01/16/21 1444 01/17/21 0027  NA 136 137  K 3.8 3.6  CL 100 103  CO2 24 23  GLUCOSE 97 107*  BUN 14 13  CREATININE 1.06 0.98  CALCIUM 9.1 8.8*   PT/INR No results for input(s): LABPROT, INR in the last 72 hours. CMP     Component Value Date/Time   NA 137 01/17/2021 0027   K 3.6 01/17/2021 0027   CL 103 01/17/2021 0027   CO2 23 01/17/2021 0027   GLUCOSE 107 (H) 01/17/2021 0027   BUN 13 01/17/2021 0027   CREATININE 0.98 01/17/2021 0027   CALCIUM 8.8 (L) 01/17/2021 0027   PROT 6.7 01/16/2021 1444   ALBUMIN 3.0 (L) 01/16/2021 1444   AST 13 (L) 01/16/2021 1444   ALT 11 01/16/2021 1444    ALKPHOS 68 01/16/2021 1444   BILITOT 0.4 01/16/2021 1444   GFRNONAA >60 01/17/2021 0027   Lipase     Component Value Date/Time   LIPASE 20 01/16/2021 1515       Studies/Results: CT ABDOMEN PELVIS W CONTRAST  Result Date: 01/16/2021 CLINICAL DATA:  Abdominal pain EXAM: CT ABDOMEN AND PELVIS WITH CONTRAST TECHNIQUE: Multidetector CT imaging of the abdomen and pelvis was performed using the standard protocol following bolus administration of intravenous contrast. CONTRAST:  153mL OMNIPAQUE IOHEXOL 300 MG/ML  SOLN COMPARISON:  None. FINDINGS: Lower chest: Linear scarring atelectasis in the posterior right lung base. Lung bases otherwise clear. Normal heart size. No pericardial effusion. Hepatobiliary: Diffuse hepatic hypoattenuation compatible with hepatic steatosis. Smooth surface contour. No concerning focal liver lesion. Focal hypoattenuation along the falciform ligament likely focal fatty infiltration. Normal gallbladder and biliary tree without visible calcified gallstones or ductal dilatation. Pancreas: No pancreatic ductal dilatation or surrounding inflammatory changes. Spleen: Normal in size. No concerning splenic lesions. Adrenals/Urinary Tract: Normal adrenal glands. Kidneys are normally located with symmetric enhancement and excretion. No suspicious renal lesion,. Punctate nonobstructing calculi are seen bilaterally. Some mild asymmetric thickening of the posterior bladder wall is favored to be reactive to the inflammatory process in the pelvis detailed below. Stomach/Bowel: Distal esophagus, stomach and duodenal sweep are unremarkable. No small  bowel wall thickening or dilatation. No evidence of obstruction. A normal appendix is visualized. Proximal colon is unremarkable. There are distal colonic diverticula. There is an irregularly thickened and narrowed appearance of the rectosigmoid with surrounding multiloculated rim enhancing fluid collection extending towards and along the right pelvic  sidewall with maximum conglomerate dimensions of 5.5 x 6.0 x 8.2 cm in transverse by AP by craniocaudal dimensions. Admixed air and fluid contained within this heterogeneous collection. Extensive surrounding phlegmon and inflammatory change. Fluid-filled appearance of the rectum. Vascular/Lymphatic: Atherosclerotic calcifications within the abdominal aorta and branch vessels. No aneurysm or ectasia. Few scattered prominent nodes are seen in the pelvis, possibly reactive. No other conspicuous or pathologically enlarged nodes in the abdomen or pelvis. Reproductive: Stranding and thickening from the rectosigmoid process above extends to the seminal vesicles. Few eccentric calcifications of the prostate. Other: Air and fluid containing collections extending along the rectosigmoid and right pelvic sidewall, as above. Surrounding fluid and phlegmon. No other free air or fluid is seen. No bowel containing hernia. Bilateral fat containing inguinal hernias. Musculoskeletal: Multilevel degenerative changes are present in the imaged portions of the spine. Additional degenerative changes in the hips and pelvis. No acute osseous abnormality or suspicious osseous lesion. IMPRESSION: 1. Markedly irregular segmental thickening narrowing of the rectosigmoid colon with extensive surrounding phlegmon as well as a large air and fluid containing collection extending towards and along the pelvic sidewall. While this could reflect a perforated diverticulitis with abscess formation appearance is highly worrisome for an underlying mass lesion with perforation given the irregularity of the wall thickening. Surrounding adenopathy may be reactive or metastatic. 2. Suspect reactive thickening of the posterior bladder wall and seminal vesicles. Could correlate with urinalysis as warranted. 3. Hepatic steatosis 4.  Aortic Atherosclerosis (ICD10-I70.0). Electronically Signed   By: Lovena Le M.D.   On: 01/16/2021 19:06     Anti-infectives: Anti-infectives (From admission, onward)    Start     Dose/Rate Route Frequency Ordered Stop   01/16/21 2000  piperacillin-tazobactam (ZOSYN) IVPB 3.375 g        3.375 g 12.5 mL/hr over 240 Minutes Intravenous Every 8 hours 01/16/21 1933          Assessment/Plan Perforated sigmoid colon with abscess  - possible perforated tumor or perforated diverticulitis  - CEA and CA125 pending - continue bowel rest and IV abx - possible surgery this admission but could consider drainage of abscess with IR. Await CEA and CA125 and repeat imaging in next 1-2 days   FEN: NPO ID: [email or web address] VTE: okay for chemical from our standpoint   LOS: 1 day    Winferd Humphrey, [facility] 01/17/2021, 9:13 AM Please see Amion for pager number during day hours 7:00am-4:30pm

## 2021-01-17 NOTE — Progress Notes (Signed)
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 as per general surgery Ns @ 125/h HTN? Could be related to pain-as needed clonidine twice daily   DVT prophylaxis: scd Code Status: F Family Communication: none Disposition:  Status is: Inpatient  Remains inpatient appropriate because:Hemodynamically unstable and IV treatments appropriate due to intensity of illness or inability to take PO  Dispo: The patient is from: Home              Anticipated d/c is to: Home              Patient currently is not medically stable to d/c.   Difficult to place patient Nogen su       Consultants:  Gen su  Procedures: scan  Antimicrobials: zosyn    Subjective:  Some pain No n Some loose stool No fever chills n v  Objective: Vitals:   01/16/21 2030 01/16/21 2045 01/16/21 2117 01/17/21 0531  BP: (!) 150/87 (!) 151/81 (!) 156/92 (!) 156/69  Pulse: 87 85 84 82  Resp: (!) 23 (!) 24 18 18   Temp:  99.3 F (37.4 C) 98.2 F (36.8 C) 98.2 F (36.8 C)  TempSrc:  Oral Oral Oral  SpO2: 98% 96% 100% 99%  Weight:   88.8 kg   Height:        Intake/Output Summary (Last 24 hours) at 01/17/2021 1122 Last data filed at 01/17/2021 0900 Gross per 24 hour  Intake 204.79 ml  Output 200 ml  Net 4.79 ml    Filed Weights   01/16/21 1558 01/16/21 2117  Weight: 91.2 kg 88.8 kg    Examination:  Awake coherent in nad no focal deficit Abd soft nt nd no rebound no guard but some back discomfort S1 2 no m   Data Reviewed: personally reviewed   CBC    Component Value Date/Time   WBC 14.1 (H) 01/17/2021 0027   RBC 3.86 (L) 01/17/2021 0027   HGB 10.9 (L) 01/17/2021 0027   HCT 33.7 (L) 01/17/2021 0027   PLT 408 (H) 01/17/2021 0027   MCV 87.3 01/17/2021 0027   MCH 28.2 01/17/2021 0027   MCHC 32.3 01/17/2021 0027   RDW 12.8 01/17/2021 0027   LYMPHSABS 1.4 01/16/2021 1444   MONOABS 0.7 01/16/2021 1444   EOSABS 0.1 01/16/2021 1444   BASOSABS 0.0 01/16/2021 1444   CMP Latest Ref Rng & Units 01/17/2021 01/16/2021  Glucose 70 - 99 mg/dL 107(H) 97  BUN 8 - 23 mg/dL 13 14  Creatinine 0.61 - 1.24 mg/dL 0.98 1.06  Sodium 135 - 145 mmol/L 137 136  Potassium 3.5 - 5.1 mmol/L 3.6 3.8  Chloride 98 - 111 mmol/L 103 100  CO2 22 - 32 mmol/L 23 24  Calcium 8.9 - 10.3 mg/dL 8.8(L) 9.1  Total Protein 6.5 - 8.1 g/dL - 6.7  Total Bilirubin 0.3 - 1.2 mg/dL - 0.4  Alkaline Phos 38 - 126 U/L - 68  AST 15 - 41 U/L - 13(L)  ALT 0 - 44 U/L - 11     Radiology Studies: CT ABDOMEN PELVIS W CONTRAST  Result Date: 01/16/2021 CLINICAL DATA:  Abdominal pain EXAM: CT ABDOMEN AND PELVIS WITH CONTRAST TECHNIQUE: Multidetector CT imaging of the abdomen and pelvis was performed using the standard protocol following bolus administration of intravenous contrast. CONTRAST:  157mL OMNIPAQUE IOHEXOL 300 MG/ML  SOLN COMPARISON:  None. FINDINGS: Lower chest: Linear scarring atelectasis in the posterior right lung base. Lung bases otherwise clear. Normal heart size. No pericardial effusion. Hepatobiliary: Diffuse hepatic hypoattenuation compatible with hepatic steatosis. Smooth surface contour. No concerning focal liver lesion. Focal hypoattenuation along the falciform ligament likely focal fatty infiltration. Normal  gallbladder and biliary tree without visible calcified gallstones or ductal dilatation. Pancreas: No pancreatic ductal dilatation or surrounding inflammatory changes. Spleen: Normal in size. No concerning splenic lesions. Adrenals/Urinary Tract: Normal adrenal glands. Kidneys are normally located with symmetric enhancement and excretion. No suspicious renal lesion,. Punctate nonobstructing calculi are seen bilaterally. Some mild asymmetric thickening of the posterior bladder wall is favored to be reactive to the inflammatory process in the pelvis detailed below. Stomach/Bowel: Distal esophagus, stomach and duodenal sweep are unremarkable. No small bowel wall thickening or dilatation. No evidence of obstruction. A normal appendix is visualized. Proximal colon is unremarkable. There are distal colonic diverticula. There is an irregularly thickened and narrowed appearance of the rectosigmoid with surrounding multiloculated rim enhancing fluid collection extending towards and along the right pelvic sidewall with maximum conglomerate dimensions of 5.5 x 6.0 x 8.2 cm in transverse by AP by craniocaudal dimensions. Admixed air and fluid contained within this heterogeneous collection. Extensive surrounding phlegmon and inflammatory change. Fluid-filled appearance of the rectum. Vascular/Lymphatic: Atherosclerotic calcifications within the abdominal aorta and branch vessels. No aneurysm or ectasia. Few scattered prominent nodes are seen in the pelvis, possibly reactive. No other conspicuous or pathologically enlarged nodes in the abdomen or pelvis. Reproductive: Stranding and thickening from the rectosigmoid process above extends to the seminal vesicles. Few eccentric calcifications of the prostate. Other: Air and fluid containing collections extending along the rectosigmoid and right pelvic sidewall, as above. Surrounding fluid and phlegmon. No other free air or fluid is seen. No bowel containing hernia. Bilateral fat  containing inguinal hernias. Musculoskeletal: Multilevel degenerative changes are present in the imaged portions of the spine. Additional degenerative changes in the hips and pelvis. No acute osseous abnormality or suspicious osseous lesion. IMPRESSION: 1. Markedly irregular segmental thickening narrowing of the rectosigmoid colon with extensive surrounding phlegmon as well as a large air and fluid containing collection extending towards and along the pelvic sidewall. While this could reflect a perforated diverticulitis with abscess formation appearance is highly worrisome for an underlying mass lesion with perforation given the irregularity of the wall thickening. Surrounding adenopathy may be reactive or metastatic. 2. Suspect reactive thickening of the posterior bladder wall and seminal vesicles. Could correlate with urinalysis as warranted. 3. Hepatic steatosis 4.  Aortic Atherosclerosis (ICD10-I70.0). Electronically Signed   By: Lovena Le M.D.   On: 01/16/2021 19:06     Scheduled Meds: Continuous Infusions:  sodium chloride 125 mL/hr at 01/17/21 0419   piperacillin-tazobactam (ZOSYN)  IV 3.375 g (01/17/21 0415)     LOS: 1 day   Time spent: [place], MD [facility] To contact the attending provider between 7A-7P or the covering provider during after hours 7P-7A, please log into the web site [email or web address] and access using universal [facility] password for that web site. If you do not have the password, please call the hospital operator.  01/17/2021, 11:22 AM

## 2021-01-18 MED ORDER — ACETAMINOPHEN 325 MG PO TABS
650.0000 mg | ORAL_TABLET | Freq: Four times a day (QID) | ORAL | Status: DC | PRN
  Administered 2021-01-18: 650 mg via ORAL
  Filled 2021-01-18: qty 2

## 2021-01-18 MED ORDER — OXYCODONE HCL 5 MG PO TABS
5.0000 mg | ORAL_TABLET | ORAL | Status: DC | PRN
  Administered 2021-01-18 (×2): 10 mg via ORAL
  Filled 2021-01-18 (×2): qty 2

## 2021-01-18 NOTE — Progress Notes (Signed)
PROGRESS NOTE   Curtis [name]  CXK:[id number] DOB: 1955-11-21 DOA: 01/16/2021 PCP: Pcp, No  Brief Narrative:  65 year old with no past medical history, never colonoscopy Constipation obstipation X 21 days or so-trialed various purgatives without improvement with intermittent minimal watery stools CT abdomen pelvis = intra-abdominal abscess question of underlying mass has been raised given irregularity of the wall thickening with surrounding adenopathy  Started on Zosyn-saline 125 cc/H General surgery consulted-feels this is perforated tumor/perforated diverticulitis and has ordered tumor panel to best determine next steps Await further plan from surgeon  Hospital-Problem based course  Perforated abdominal abscess query cause Follow CA125, CEA Zosyn bowel rest etc.  rest per general surgery Ns @ 125/h HTN? Could be related to pain-as needed clonidine twice daily   DVT prophylaxis: scd Code Status: F Family Communication: none Disposition:  Status is: Inpatient  Remains inpatient appropriate because:Hemodynamically unstable and IV treatments appropriate due to intensity of illness or inability to take PO  Dispo: The patient is from: Home              Anticipated d/c is to: Home              Patient currently is not medically stable to d/c.   Difficult to place patient Nogen su   Consultants:  Gen su  Procedures: scan  Antimicrobials: zosyn    Subjective:  Overall doing fair Await next steps per Gen surg  Objective: Vitals:   01/17/21 0531 01/17/21 1339 01/17/21 2213 01/18/21 0506  BP: (!) 156/69 (!) 153/75 140/73 (!) 146/68  Pulse: 82  86 82  Resp: 18 17 19 18   Temp: 98.2 F (36.8 C) 99.5 F (37.5 C) 99.5 F (37.5 C) 99.5 F (37.5 C)  TempSrc: Oral Oral Oral Oral  SpO2: 99% 98% 97% 97%  Weight:      Height:        Intake/Output Summary (Last 24 hours) at 01/18/2021 1403 Last data filed at 01/18/2021 0900 Gross per 24 hour  Intake 2576.18 ml  Output  --  Net 2576.18 ml    Filed Weights   01/16/21 1558 01/16/21 2117  Weight: 91.2 kg 88.8 kg    Examination:  coherent in nad no focal deficit Abd soft nt nd no rebound no guard but some back discomfort S1 2 no m Abd soft   Data Reviewed: personally reviewed   CBC    Component Value Date/Time   WBC 14.1 (H) 01/17/2021 0027   RBC 3.86 (L) 01/17/2021 0027   HGB 10.9 (L) 01/17/2021 0027   HCT 33.7 (L) 01/17/2021 0027   PLT 408 (H) 01/17/2021 0027   MCV 87.3 01/17/2021 0027   MCH 28.2 01/17/2021 0027   MCHC 32.3 01/17/2021 0027   RDW 12.8 01/17/2021 0027   LYMPHSABS 1.4 01/16/2021 1444   MONOABS 0.7 01/16/2021 1444   EOSABS 0.1 01/16/2021 1444   BASOSABS 0.0 01/16/2021 1444   CMP Latest Ref Rng & Units 01/17/2021 01/16/2021  Glucose 70 - 99 mg/dL 107(H) 97  BUN 8 - 23 mg/dL 13 14  Creatinine 0.61 - 1.24 mg/dL 0.98 1.06  Sodium 135 - 145 mmol/L 137 136  Potassium 3.5 - 5.1 mmol/L 3.6 3.8  Chloride 98 - 111 mmol/L 103 100  CO2 22 - 32 mmol/L 23 24  Calcium 8.9 - 10.3 mg/dL 8.8(L) 9.1  Total Protein 6.5 - 8.1 g/dL - 6.7  Total Bilirubin 0.3 - 1.2 mg/dL - 0.4  Alkaline Phos 38 - 126 U/L - 68  AST 15 - 41 U/L - 13(L)  ALT 0 - 44 U/L - 11     Radiology Studies: CT ABDOMEN PELVIS W CONTRAST  Result Date: 01/16/2021 CLINICAL DATA:  Abdominal pain EXAM: CT ABDOMEN AND PELVIS WITH CONTRAST TECHNIQUE: Multidetector CT imaging of the abdomen and pelvis was performed using the standard protocol following bolus administration of intravenous contrast. CONTRAST:  141mL OMNIPAQUE IOHEXOL 300 MG/ML  SOLN COMPARISON:  None. FINDINGS: Lower chest: Linear scarring atelectasis in the posterior right lung base. Lung bases otherwise clear. Normal heart size. No pericardial effusion. Hepatobiliary: Diffuse hepatic hypoattenuation compatible with hepatic steatosis. Smooth surface contour. No concerning focal liver lesion. Focal hypoattenuation along the falciform ligament likely focal fatty  infiltration. Normal gallbladder and biliary tree without visible calcified gallstones or ductal dilatation. Pancreas: No pancreatic ductal dilatation or surrounding inflammatory changes. Spleen: Normal in size. No concerning splenic lesions. Adrenals/Urinary Tract: Normal adrenal glands. Kidneys are normally located with symmetric enhancement and excretion. No suspicious renal lesion,. Punctate nonobstructing calculi are seen bilaterally. Some mild asymmetric thickening of the posterior bladder wall is favored to be reactive to the inflammatory process in the pelvis detailed below. Stomach/Bowel: Distal esophagus, stomach and duodenal sweep are unremarkable. No small bowel wall thickening or dilatation. No evidence of obstruction. A normal appendix is visualized. Proximal colon is unremarkable. There are distal colonic diverticula. There is an irregularly thickened and narrowed appearance of the rectosigmoid with surrounding multiloculated rim enhancing fluid collection extending towards and along the right pelvic sidewall with maximum conglomerate dimensions of 5.5 x 6.0 x 8.2 cm in transverse by AP by craniocaudal dimensions. Admixed air and fluid contained within this heterogeneous collection. Extensive surrounding phlegmon and inflammatory change. Fluid-filled appearance of the rectum. Vascular/Lymphatic: Atherosclerotic calcifications within the abdominal aorta and branch vessels. No aneurysm or ectasia. Few scattered prominent nodes are seen in the pelvis, possibly reactive. No other conspicuous or pathologically enlarged nodes in the abdomen or pelvis. Reproductive: Stranding and thickening from the rectosigmoid process above extends to the seminal vesicles. Few eccentric calcifications of the prostate. Other: Air and fluid containing collections extending along the rectosigmoid and right pelvic sidewall, as above. Surrounding fluid and phlegmon. No other free air or fluid is seen. No bowel containing  hernia. Bilateral fat containing inguinal hernias. Musculoskeletal: Multilevel degenerative changes are present in the imaged portions of the spine. Additional degenerative changes in the hips and pelvis. No acute osseous abnormality or suspicious osseous lesion. IMPRESSION: 1. Markedly irregular segmental thickening narrowing of the rectosigmoid colon with extensive surrounding phlegmon as well as a large air and fluid containing collection extending towards and along the pelvic sidewall. While this could reflect a perforated diverticulitis with abscess formation appearance is highly worrisome for an underlying mass lesion with perforation given the irregularity of the wall thickening. Surrounding adenopathy may be reactive or metastatic. 2. Suspect reactive thickening of the posterior bladder wall and seminal vesicles. Could correlate with urinalysis as warranted. 3. Hepatic steatosis 4.  Aortic Atherosclerosis (ICD10-I70.0). Electronically Signed   By: Lovena Le M.D.   On: 01/16/2021 19:06     Scheduled Meds: Continuous Infusions:  sodium chloride 125 mL/hr at 01/18/21 0837   piperacillin-tazobactam (ZOSYN)  IV 3.375 g (01/18/21 1112)     LOS: 2 days   Time spent: 29  Nita Sells, MD [facility] To contact the attending provider between 7A-7P or the covering provider during after hours 7P-7A, please log into the web site [email or web address] and access using universal [facility] password for that web site. If you do not have the password, please call the hospital operator.  01/18/2021, 2:03 PM

## 2021-01-18 NOTE — Progress Notes (Addendum)
Progress Note     Subjective: CC: without any pain this am. Having Bms. No nausea or emesis  Objective: Vital signs in last 24 hours: Temp:  [99.5 F (37.5 C)] 99.5 F (37.5 C) (06/26 0506) Pulse Rate:  [82-86] 82 (06/26 0506) Resp:  [17-19] 18 (06/26 0506) BP: (140-153)/(68-75) 146/68 (06/26 0506) SpO2:  [97 %-98 %] 97 % (06/26 0506) Last BM Date: 01/16/21  Intake/Output from previous day: 06/25 0701 - 06/26 0700 In: 2576.2 [I.V.:2376.2; IV Piggyback:200] Out: -  Intake/Output this shift: No intake/output data recorded.  PE: General: pleasant, WD, male who is laying in bed in NAD HEENT: head is normocephalic, atraumatic.   Mouth is pink and moist Heart: regular, rate, and rhythm.  Palpable radial pulses bilaterally Lungs: Respiratory effort nonlabored Abd: soft, ND, +BS, no TTP  MS: all 4 extremities are symmetrical with no cyanosis, clubbing, or edema. Skin: warm and dry with no masses, lesions, or rashes Psych: A&Ox3 with an appropriate affect.    Lab Results:  Recent Labs    01/16/21 1444 01/17/21 0027  WBC 13.7* 14.1*  HGB 11.3* 10.9*  HCT 35.1* 33.7*  PLT 373 408*    BMET Recent Labs    01/16/21 1444 01/17/21 0027  NA 136 137  K 3.8 3.6  CL 100 103  CO2 24 23  GLUCOSE 97 107*  BUN 14 13  CREATININE 1.06 0.98  CALCIUM 9.1 8.8*    PT/INR No results for input(s): LABPROT, INR in the last 72 hours. CMP     Component Value Date/Time   NA 137 01/17/2021 0027   K 3.6 01/17/2021 0027   CL 103 01/17/2021 0027   CO2 23 01/17/2021 0027   GLUCOSE 107 (H) 01/17/2021 0027   BUN 13 01/17/2021 0027   CREATININE 0.98 01/17/2021 0027   CALCIUM 8.8 (L) 01/17/2021 0027   PROT 6.7 01/16/2021 1444   ALBUMIN 3.0 (L) 01/16/2021 1444   AST 13 (L) 01/16/2021 1444   ALT 11 01/16/2021 1444   ALKPHOS 68 01/16/2021 1444   BILITOT 0.4 01/16/2021 1444   GFRNONAA >60 01/17/2021 0027   Lipase     Component Value Date/Time   LIPASE 20 01/16/2021 1515        Studies/Results: CT ABDOMEN PELVIS W CONTRAST  Result Date: 01/16/2021 CLINICAL DATA:  Abdominal pain EXAM: CT ABDOMEN AND PELVIS WITH CONTRAST TECHNIQUE: Multidetector CT imaging of the abdomen and pelvis was performed using the standard protocol following bolus administration of intravenous contrast. CONTRAST:  139mL OMNIPAQUE IOHEXOL 300 MG/ML  SOLN COMPARISON:  None. FINDINGS: Lower chest: Linear scarring atelectasis in the posterior right lung base. Lung bases otherwise clear. Normal heart size. No pericardial effusion. Hepatobiliary: Diffuse hepatic hypoattenuation compatible with hepatic steatosis. Smooth surface contour. No concerning focal liver lesion. Focal hypoattenuation along the falciform ligament likely focal fatty infiltration. Normal gallbladder and biliary tree without visible calcified gallstones or ductal dilatation. Pancreas: No pancreatic ductal dilatation or surrounding inflammatory changes. Spleen: Normal in size. No concerning splenic lesions. Adrenals/Urinary Tract: Normal adrenal glands. Kidneys are normally located with symmetric enhancement and excretion. No suspicious renal lesion,. Punctate nonobstructing calculi are seen bilaterally. Some mild asymmetric thickening of the posterior bladder wall is favored to be reactive to the inflammatory process in the pelvis detailed below. Stomach/Bowel: Distal esophagus, stomach and duodenal sweep are unremarkable. No small bowel wall thickening or dilatation. No evidence of obstruction. A normal appendix is visualized. Proximal colon is unremarkable. There are distal colonic diverticula. There  is an irregularly thickened and narrowed appearance of the rectosigmoid with surrounding multiloculated rim enhancing fluid collection extending towards and along the right pelvic sidewall with maximum conglomerate dimensions of 5.5 x 6.0 x 8.2 cm in transverse by AP by craniocaudal dimensions. Admixed air and fluid contained within this  heterogeneous collection. Extensive surrounding phlegmon and inflammatory change. Fluid-filled appearance of the rectum. Vascular/Lymphatic: Atherosclerotic calcifications within the abdominal aorta and branch vessels. No aneurysm or ectasia. Few scattered prominent nodes are seen in the pelvis, possibly reactive. No other conspicuous or pathologically enlarged nodes in the abdomen or pelvis. Reproductive: Stranding and thickening from the rectosigmoid process above extends to the seminal vesicles. Few eccentric calcifications of the prostate. Other: Air and fluid containing collections extending along the rectosigmoid and right pelvic sidewall, as above. Surrounding fluid and phlegmon. No other free air or fluid is seen. No bowel containing hernia. Bilateral fat containing inguinal hernias. Musculoskeletal: Multilevel degenerative changes are present in the imaged portions of the spine. Additional degenerative changes in the hips and pelvis. No acute osseous abnormality or suspicious osseous lesion. IMPRESSION: 1. Markedly irregular segmental thickening narrowing of the rectosigmoid colon with extensive surrounding phlegmon as well as a large air and fluid containing collection extending towards and along the pelvic sidewall. While this could reflect a perforated diverticulitis with abscess formation appearance is highly worrisome for an underlying mass lesion with perforation given the irregularity of the wall thickening. Surrounding adenopathy may be reactive or metastatic. 2. Suspect reactive thickening of the posterior bladder wall and seminal vesicles. Could correlate with urinalysis as warranted. 3. Hepatic steatosis 4.  Aortic Atherosclerosis (ICD10-I70.0). Electronically Signed   By: Lovena Le M.D.   On: 01/16/2021 19:06    Anti-infectives: Anti-infectives (From admission, onward)    Start     Dose/Rate Route Frequency Ordered Stop   01/16/21 2000  piperacillin-tazobactam (ZOSYN) IVPB 3.375 g         3.375 g 12.5 mL/hr over 240 Minutes Intravenous Every 8 hours 01/16/21 1933          Assessment/Plan Perforated sigmoid colon with abscess  - possible perforated tumor or perforated diverticulitis  - CEA and CA125 pending - continue bowel rest and IV abx - repeat CT tomorrow - possible surgery this admission but could consider drainage of abscess with IR. Await CEA and CA125 and repeat CT - will advance to FLD today - back to NPO for abdominal pain  FEN: FLD, IVF per primary ID: zosyn 6/24>> VTE: okay for chemical from our standpoint   LOS: 2 days    Winferd Humphrey, [facility] 01/18/2021, 8:59 AM Please see Amion for pager number during day hours 7:00am-4:30pm

## 2021-01-19 ENCOUNTER — Inpatient Hospital Stay (HOSPITAL_COMMUNITY): Payer: Self-pay

## 2021-01-19 LAB — CBC WITH DIFFERENTIAL/PLATELET
Abs Immature Granulocytes: 0.03 10*3/uL (ref 0.00–0.07)
Basophils Absolute: 0 10*3/uL (ref 0.0–0.1)
Basophils Relative: 0 %
Eosinophils Absolute: 0.2 10*3/uL (ref 0.0–0.5)
Eosinophils Relative: 2 %
HCT: 30.4 % — ABNORMAL LOW (ref 39.0–52.0)
Hemoglobin: 10 g/dL — ABNORMAL LOW (ref 13.0–17.0)
Immature Granulocytes: 0 %
Lymphocytes Relative: 11 %
Lymphs Abs: 1.1 10*3/uL (ref 0.7–4.0)
MCH: 28 pg (ref 26.0–34.0)
MCHC: 32.9 g/dL (ref 30.0–36.0)
MCV: 85.2 fL (ref 80.0–100.0)
Monocytes Absolute: 0.6 10*3/uL (ref 0.1–1.0)
Monocytes Relative: 6 %
Neutro Abs: 8.4 10*3/uL — ABNORMAL HIGH (ref 1.7–7.7)
Neutrophils Relative %: 81 %
Platelets: 318 10*3/uL (ref 150–400)
RBC: 3.57 MIL/uL — ABNORMAL LOW (ref 4.22–5.81)
RDW: 12.4 % (ref 11.5–15.5)
WBC: 10.4 10*3/uL (ref 4.0–10.5)
nRBC: 0 % (ref 0.0–0.2)

## 2021-01-19 LAB — COMPREHENSIVE METABOLIC PANEL
ALT: 12 U/L (ref 0–44)
AST: 17 U/L (ref 15–41)
Albumin: 2.3 g/dL — ABNORMAL LOW (ref 3.5–5.0)
Alkaline Phosphatase: 53 U/L (ref 38–126)
Anion gap: 9 (ref 5–15)
BUN: 7 mg/dL — ABNORMAL LOW (ref 8–23)
CO2: 24 mmol/L (ref 22–32)
Calcium: 7.9 mg/dL — ABNORMAL LOW (ref 8.9–10.3)
Chloride: 102 mmol/L (ref 98–111)
Creatinine, Ser: 0.98 mg/dL (ref 0.61–1.24)
GFR, Estimated: >60 mL/min (ref >60)
Glucose, Bld: 96 mg/dL (ref 70–99)
Potassium: 3.1 mmol/L — ABNORMAL LOW (ref 3.5–5.1)
Sodium: 135 mmol/L (ref 135–145)
Total Bilirubin: 0.4 mg/dL (ref 0.3–1.2)
Total Protein: 5.8 g/dL — ABNORMAL LOW (ref 6.5–8.1)

## 2021-01-19 LAB — CEA: CEA: 5.8 ng/mL — ABNORMAL HIGH (ref 0.0–4.7)

## 2021-01-19 LAB — CA 125: Cancer Antigen (CA) 125: 5.1 U/mL

## 2021-01-19 MED ORDER — POTASSIUM CL IN DEXTROSE 5% 20 MEQ/L IV SOLN
20.0000 meq | INTRAVENOUS | Status: DC
  Administered 2021-01-19 – 2021-01-22 (×5): 20 meq via INTRAVENOUS
  Filled 2021-01-19 (×8): qty 1000

## 2021-01-19 MED ORDER — HYDRALAZINE HCL 20 MG/ML IJ SOLN: 5.0000 mg | Freq: Four times a day (QID) | INTRAMUSCULAR | Status: DC | PRN

## 2021-01-19 MED ORDER — IOHEXOL 300 MG/ML  SOLN
100.0000 mL | Freq: Once | INTRAMUSCULAR | Status: AC | PRN
  Administered 2021-01-19: 100 mL via INTRAVENOUS

## 2021-01-19 MED ORDER — POLYETHYLENE GLYCOL 3350 17 G PO PACK
17.0000 g | PACK | Freq: Every day | ORAL | Status: DC
  Administered 2021-01-20: 17 g via ORAL
  Filled 2021-01-19: qty 1

## 2021-01-19 MED ORDER — IOHEXOL 9 MG/ML PO SOLN
500.0000 mL | ORAL | Status: AC
  Administered 2021-01-19 (×2): 500 mL via ORAL

## 2021-01-19 NOTE — Progress Notes (Signed)
   01/19/21 1258  Vitals  Temp 98.8 F (37.1 C)  Temp Source Oral  BP (!) 176/80  MAP (mmHg) 107  BP Location Left Arm  BP Method Automatic  Patient Position (if appropriate) Lying  Pulse Rate 68  Pulse Rate Source Dinamap  Resp 19  MEWS COLOR  MEWS Score Color Green  Oxygen Therapy  SpO2 100 %  Pain Assessment  Pain Scale 0-10  Pain Score 0  MEWS Score  MEWS Temp 0  MEWS Systolic 0  MEWS Pulse 0  MEWS RR 0  MEWS LOC 0  MEWS Score 0  Provider Notification  Provider Name/Title Dr. Verlon Au  Date Provider Notified 01/19/21  Time Provider Notified 1408  Notification Type  (secure chat & face to face)  Notification Reason Critical result  Test performed and critical result Blood pressure; 176/80  Date Critical Result Received 01/19/21  Time Critical Result Received 1400  Provider response No new orders (PRN Clonidine)  Date of Provider Response 01/19/21  Time of Provider Response 1500  Note  Observations  (Patient resting comfortably in bed. Will continue to monitor and reassess BP.)

## 2021-01-19 NOTE — Progress Notes (Signed)
Patient seen and examined   I have discussed with him he likely has a more complicated issue than just a perforation I called this morning [facility] at [phone] (account [phone]) and asked for the results of the CA 125 and the CEA They should be available later today versus tomorrow morning to help clarify if this is infectious or tumor He is on D5 with K because of mild hypokalemia He understands that he needs to stay in the hospital and will probably require surgery later this admission Clonidine 0.1 is reasonable I will also place as needed hydralazine if blood pressures above 180  I appreciate Dr. Georgette Dover taking over this patient's care given paucity of his medical illnesses I am available through tomorrow if any further internal medicine questions arise  Thank you  Verneita Griffes, MD [facility] 3:25 PM

## 2021-01-19 NOTE — Progress Notes (Signed)
   Progress Note     Subjective: CC: he is still having loose Bms. No pain, nausea/emesis on fulls yesterday or with oral contrast this am. Still working on finishing second contrast bottle  Objective: Vital signs in last 24 hours: Temp:  [98.1 F (36.7 C)-99.8 F (37.7 C)] 98.3 F (36.8 C) (06/27 0539) Pulse Rate:  [62-79] 62 (06/27 0539) Resp:  [17-19] 18 (06/27 0539) BP: (150-152)/(67-71) 152/71 (06/27 0539) SpO2:  [97 %-98 %] 98 % (06/27 0539) Last BM Date: 01/18/21  Intake/Output from previous day: 06/26 0701 - 06/27 0700 In: 3791.6 [P.O.:988; I.V.:2803.6] Out: -  Intake/Output this shift: No intake/output data recorded.  PE: General: pleasant, WD, male who is laying in bed in NAD HEENT: head is normocephalic, atraumatic.   Mouth is pink and moist Heart: regular, rate, and rhythm.  Palpable radial pulses bilaterally Lungs: Respiratory effort nonlabored Abd: soft, ND, +BS, no tenderness to palpation MS: all 4 extremities are symmetrical with no cyanosis, clubbing, or edema. Skin: warm and dry with no masses, lesions, or rashes Psych: A&Ox3 with an appropriate affect.    Lab Results:  Recent Labs    01/17/21 0027 01/19/21 0042  WBC 14.1* 10.4  HGB 10.9* 10.0*  HCT 33.7* 30.4*  PLT 408* 318    BMET Recent Labs    01/17/21 0027 01/19/21 0042  NA 137 135  K 3.6 3.1*  CL 103 102  CO2 23 24  GLUCOSE 107* 96  BUN 13 7*  CREATININE 0.98 0.98  CALCIUM 8.8* 7.9*    PT/INR No results for input(s): LABPROT, INR in the last 72 hours. CMP     Component Value Date/Time   NA 135 01/19/2021 0042   K 3.1 (L) 01/19/2021 0042   CL 102 01/19/2021 0042   CO2 24 01/19/2021 0042   GLUCOSE 96 01/19/2021 0042   BUN 7 (L) 01/19/2021 0042   CREATININE 0.98 01/19/2021 0042   CALCIUM 7.9 (L) 01/19/2021 0042   PROT 5.8 (L) 01/19/2021 0042   ALBUMIN 2.3 (L) 01/19/2021 0042   AST 17 01/19/2021 0042   ALT 12 01/19/2021 0042   ALKPHOS 53 01/19/2021 0042   BILITOT 0.4  01/19/2021 0042   GFRNONAA >60 01/19/2021 0042   Lipase     Component Value Date/Time   LIPASE 20 01/16/2021 1515       Studies/Results: No results found.  Anti-infectives: Anti-infectives (From admission, onward)    Start     Dose/Rate Route Frequency Ordered Stop   01/16/21 2000  piperacillin-tazobactam (ZOSYN) IVPB 3.375 g        3.375 g 12.5 mL/hr over 240 Minutes Intravenous Every 8 hours 01/16/21 1933          Assessment/Plan Perforated sigmoid colon with abscess  - possible perforated tumor or perforated diverticulitis  - CEA and CA125 pending - leukocytosis resolved - continue bowel rest and IV abx - repeat CT today not yet done - possible surgery this admission but could consider drainage of abscess with IR. Await CEA and CA125 and repeat CT - tolerated FLD yesterday - NPO for CT  FEN: NPO, IVF per primary ID: [email or web address] 6/24>> VTE: okay for chemical from our standpoint  HTN   LOS: 3 days    Winferd Humphrey, [facility] 01/19/2021, 7:39 AM Please see Amion for pager number during day hours 7:00am-4:30pm

## 2021-01-20 DIAGNOSIS — R933 Abnormal findings on diagnostic imaging of other parts of digestive tract: Secondary | ICD-10-CM

## 2021-01-20 LAB — COMPREHENSIVE METABOLIC PANEL
ALT: 12 U/L (ref 0–44)
AST: 17 U/L (ref 15–41)
Albumin: 2.3 g/dL — ABNORMAL LOW (ref 3.5–5.0)
Alkaline Phosphatase: 54 U/L (ref 38–126)
Anion gap: 9 (ref 5–15)
BUN: <5 mg/dL — ABNORMAL LOW (ref 8–23)
CO2: 26 mmol/L (ref 22–32)
Calcium: 8.5 mg/dL — ABNORMAL LOW (ref 8.9–10.3)
Chloride: 101 mmol/L (ref 98–111)
Creatinine, Ser: 0.93 mg/dL (ref 0.61–1.24)
GFR, Estimated: >60 mL/min (ref >60)
Glucose, Bld: 118 mg/dL — ABNORMAL HIGH (ref 70–99)
Potassium: 3 mmol/L — ABNORMAL LOW (ref 3.5–5.1)
Sodium: 136 mmol/L (ref 135–145)
Total Bilirubin: 0.6 mg/dL (ref 0.3–1.2)
Total Protein: 6.1 g/dL — ABNORMAL LOW (ref 6.5–8.1)

## 2021-01-20 LAB — CBC WITH DIFFERENTIAL/PLATELET
Abs Immature Granulocytes: 0.04 10*3/uL (ref 0.00–0.07)
Basophils Absolute: 0 10*3/uL (ref 0.0–0.1)
Basophils Relative: 0 %
Eosinophils Absolute: 0.2 10*3/uL (ref 0.0–0.5)
Eosinophils Relative: 2 %
HCT: 31.5 % — ABNORMAL LOW (ref 39.0–52.0)
Hemoglobin: 10.5 g/dL — ABNORMAL LOW (ref 13.0–17.0)
Immature Granulocytes: 0 %
Lymphocytes Relative: 8 %
Lymphs Abs: 0.9 10*3/uL (ref 0.7–4.0)
MCH: 27.8 pg (ref 26.0–34.0)
MCHC: 33.3 g/dL (ref 30.0–36.0)
MCV: 83.3 fL (ref 80.0–100.0)
Monocytes Absolute: 0.7 10*3/uL (ref 0.1–1.0)
Monocytes Relative: 6 %
Neutro Abs: 9.9 10*3/uL — ABNORMAL HIGH (ref 1.7–7.7)
Neutrophils Relative %: 84 %
Platelets: 333 10*3/uL (ref 150–400)
RBC: 3.78 MIL/uL — ABNORMAL LOW (ref 4.22–5.81)
RDW: 12.3 % (ref 11.5–15.5)
WBC: 11.7 10*3/uL — ABNORMAL HIGH (ref 4.0–10.5)
nRBC: 0 % (ref 0.0–0.2)

## 2021-01-20 NOTE — Consult Note (Signed)
[place] Nurse requested for preoperative stoma site marking  Discussed surgical procedure and stoma creation with patient and family.  Explained role of the [place] nurse team.  Provided the patient with educational booklet and provided samples of pouching options.  Answered patient and family questions.   Examined patient lying and  sitting in order to place the marking in the patient's visual field, away from any creases or abdominal contour issues and within the rectus muscle.  Attempted to mark below the patient's belt line.   Marked for colostomy in the LUQ and RUQ __5__ cm to the left and right of the umbilicus and level with the umbilicus.   Patient's abdomen cleansed with CHG wipes at site markings, allowed to air dry prior to marking.Covered mark with thin film transparent dressing to preserve mark until date of surgery.   [place] Nurse team will follow up with patient after surgery for continue ostomy care and teaching.  Val Riles, RN, MSN, CWOCN, CNS-BC, pager [phone]

## 2021-01-20 NOTE — Progress Notes (Signed)
Responded to page to visit with patient to  provide emotional and spiritual support.  Patient is preparing for surgery on tomorrow and is concern about his ability to work and provide for his family after surgery. Patient is tearful and needed to share.  Prayed with patient and listened as he shared.  Will past on to unit chaplain to follow as needed.  Jaclynn Major, [place], [facility], Pager [phone]

## 2021-01-20 NOTE — Progress Notes (Signed)
Subjective/Chief Complaint: Patient feels great - hand a small bowel movement this morning Reviewed scans with Colorectal Surgery   Objective: Vital signs in last 24 hours: Temp:  [98.8 F (37.1 C)-99.4 F (37.4 C)] 99.4 F (37.4 C) (06/28 0635) Pulse Rate:  [68-72] 72 (06/28 0635) Resp:  [19-20] 20 (06/28 0635) BP: (151-176)/(69-80) 153/77 (06/28 0635) SpO2:  [96 %-100 %] 98 % (06/28 0635) Last BM Date: 01/20/21  Intake/Output from previous day: 06/27 0701 - 06/28 0700 In: 2801.2 [P.O.:990; I.V.:1611.2; IV Piggyback:200] Out: -  Intake/Output this shift: No intake/output data recorded.  General: pleasant, WD, male who is laying in bed in NAD HEENT: head is normocephalic, atraumatic.   Mouth is pink and moist Heart: regular, rate, and rhythm.  Palpable radial pulses bilaterally Lungs: Respiratory effort nonlabored Abd: soft, ND, +BS, no tenderness to palpation MS: all 4 extremities are symmetrical with no cyanosis, clubbing, or edema. Skin: warm and dry with no masses, lesions, or rashes Psych: A&Ox3 with an appropriate affect.   Lab Results:  Recent Labs    01/19/21 0042 01/20/21 0116  WBC 10.4 11.7*  HGB 10.0* 10.5*  HCT 30.4* 31.5*  PLT 318 333   BMET Recent Labs    01/19/21 0042 01/20/21 0116  NA 135 136  K 3.1* 3.0*  CL 102 101  CO2 24 26  GLUCOSE 96 118*  BUN 7* <5*  CREATININE 0.98 0.93  CALCIUM 7.9* 8.5*   PT/INR No results for input(s): LABPROT, INR in the last 72 hours. ABG No results for input(s): PHART, HCO3 in the last 72 hours.  Invalid input(s): PCO2, PO2  Studies/Results: CT ABDOMEN PELVIS W CONTRAST  Result Date: 01/19/2021 CLINICAL DATA:  Abdominal pain. Perforated diverticulitis versus mass on previous study. EXAM: CT ABDOMEN AND PELVIS WITH CONTRAST TECHNIQUE: Multidetector CT imaging of the abdomen and pelvis was performed using the standard protocol following bolus administration of intravenous contrast. CONTRAST:  146mL  OMNIPAQUE IOHEXOL 300 MG/ML  SOLN COMPARISON:  01/16/2021 FINDINGS: Lower chest: No pleural or pericardial effusion. Visualized lung bases clear. Hepatobiliary: No focal liver abnormality is seen. No gallstones, gallbladder wall thickening, or biliary dilatation. Pancreas: Unremarkable. No pancreatic ductal dilatation or surrounding inflammatory changes. Spleen: Normal in size without focal abnormality. Adrenals/Urinary Tract: Normal adrenal glands. Bilateral nephrolithiasis, largest 3 mm calculus right mid pole. No hydronephrosis. Urinary bladder nondistended. Stomach/Bowel: Stomach is decompressed. Small bowel unremarkable. Normal appendix. The colon is nondilated. Multiple descending and sigmoid diverticula. Persistent complex peripherally-enhancing mass-like process centered at the rectosigmoid junction with loculated low-attenuation components suggesting necrosis or fluid. The cephalad extent of the process contains some bubbles of gas as before, likely extraluminal. Regional inflammatory/edematous changes have progressed since the previous exam. The process abuts the seminal vesicles anteriorly. Vascular/Lymphatic: Increased number of subcentimeter perirectal lymph nodes and some borderline enlarged regional mesenteric nodes. Moderate calcified aortoiliac plaque without aneurysm or stenosis. Reproductive: Prostate is unremarkable. Other: No ascites.  No free air. Musculoskeletal: No acute or significant osseous findings. IMPRESSION: 1. Worsening inflammatory changes around the complex rectal process, worrisome for neoplasm with contained perforation. Recommend colonoscopic correlation. Regional metastatic or reactive adenopathy as before. 2.  Aortic Atherosclerosis (ICD10-170.0). 3. Bilateral nephrolithiasis without hydronephrosis. Electronically Signed   By: Lucrezia Europe M.D.   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 Will need diverting loop colostomy - likely Wednesday.   Pending pathology from biopsy - probable neoadjuvant chemotherapy/ radiation before definitive resection.  Flex sigmoidoscopy does carry some risk for making the perforation worse, but attempts at a definitive resection today would be high risk for damage to surrounding Structures (ureters, seminal vesicles).     LOS: 4 days    Maia Petties 01/20/2021

## 2021-01-20 NOTE — TOC Initial Note (Addendum)
Transition of Care [facility]) - Initial/Assessment Note    Patient Details  Name: Curtis [name] MRN: [id number] Date of Birth: September 20, 1955  Transition of Care [facility]) CM/SW Contact:    Marilu Favre, RN Phone Number: 01/20/2021, 1:12 PM  Clinical Narrative:                 Spoke to patient and friend at bedside.   Confirmed face sheet information.   Patient from home with adult children.   Patient does not have a PCP, agreeable for NCM to schedule an appointment at a [facility] . NCM will do and place information on AVS.   Discussed plans for new ostomy. NCM spoke with [place] nurse earlier she will give patient information to enroll in an indigent program for supplies. NCM will ask discharging nurse to provide some extra supplies at discharge. [place] nurse and bedside nurses will provide teaching prior to discharge. NCM will make a referral to [facility] with [facility] to see if patient qualifies for charity home health nurse.   Called [facility] she will review clinicals.   At discharge will ask MD to send prescriptions to [place] and NCM will assist with filling.    [place] resources provided to patient.   NCM will continue to follow.   Called [facility] to schedule appointment to establish care , no answer will try again   [facility] with [facility] declined referral.   Expected Discharge Plan: [place] Barriers to Discharge: Continued Medical Work up   Patient Goals and CMS Choice Patient states their goals for this hospitalization and ongoing recovery are:: to return to home CMS Medicare.gov Compare Post Acute Care list provided to:: Patient Choice offered to / list presented to : Patient  Expected Discharge Plan and Services Expected Discharge Plan: [place] In-house Referral: Development worker, community Discharge Planning Services: CM Consult, [place] Program, Medication Assistance, [place] Acute  Care Choice: [place] arrangements for the past 2 months: Single Family Home                                      Prior Living Arrangements/Services Living arrangements for the past 2 months: Single Family Home Lives with:: Adult Children Patient language and need for interpreter reviewed:: Yes Do you feel safe going back to the place where you live?: Yes      Need for Family Participation in Patient Care: Yes (Comment) Care giver support system in place?: Yes (comment)   Criminal Activity/Legal Involvement Pertinent to Current Situation/Hospitalization: No - Comment as needed  Activities of Daily Living Home Assistive Devices/Equipment: None ADL Screening (condition at time of admission) Patient's cognitive ability adequate to safely complete daily activities?: Yes Is the patient deaf or have difficulty hearing?: No Does the patient have difficulty seeing, even when wearing glasses/contacts?: No Does the patient have difficulty concentrating, remembering, or making decisions?: No Patient able to express need for assistance with ADLs?: Yes Does the patient have difficulty dressing or bathing?: No Independently performs ADLs?: Yes (appropriate for developmental age) Communication: Independent Dressing (OT): Independent Grooming: Independent Feeding: Independent Bathing: Independent Toileting: Independent In/Out Bed: Independent Walks in Home: Independent Does the patient have difficulty walking or climbing stairs?: No Weakness of Legs: None Weakness of Arms/Hands: None  Permission Sought/Granted   Permission granted to share information with :  No              Emotional Assessment Appearance:: Appears stated age Attitude/Demeanor/Rapport: Engaged Affect (typically observed): Accepting Orientation: : Oriented to Self, Oriented to Place, Oriented to  Time, Oriented to Situation Alcohol / Substance Use: Not Applicable Psych Involvement: No  (comment)  Admission diagnosis:  Bowel perforation (HCC) [K63.1] Intra-abdominal abscess ([place]) [K65.1] Patient Active Problem List   Diagnosis Date Noted   Bowel perforation ([place]) 01/16/2021   Intra-abdominal abscess ([place]) 01/16/2021   Elevated blood pressure reading 01/16/2021   Leukocytosis 01/16/2021   Hepatic steatosis 01/16/2021   Bladder wall thickening 01/16/2021   Microscopic hematuria 01/16/2021   PCP:  Pcp, No Pharmacy:   [facility] [address] [place] - [address] [place] [place] [address] Phone: [phone] Fax: [facility] [address] [place] [address] Phone: [phone] Fax: [phone]     Social Determinants of Health (SDOH) Interventions    Readmission Risk Interventions No flowsheet data found.

## 2021-01-20 NOTE — H&P (View-Only) (Signed)
Consultation  Referring Provider: Dr.  Georgette Dover    Primary Care Physician:  Pcp, No Primary Gastroenterologist:   Althia Forts      Reason for Consultation: Perforated diverticulitis, question of colon cancer            HPI:   Curtis [name] is a 65 y.o. male with no significant past medical history who presented originally to the hospital on 6/24 with constipation and abdominal pain.  Been occurring for several weeks.  In the ED found to have possible bowel perforation/perforated diverticulitis with abscess formation versus mass lesion on CT.  General surgery was consulted and is started the patient on antibiotics.  We are consulted by the surgeons to perform flex sigmoidoscopy to try and get biopsy of questionable mass lesion prior to surgery.    Today, the patient tells me that he has been a very healthy individual and is only been to the doctor 13 times that he can remember since [place].  Explains that most recently he started having trouble with constipation and was trying various over-the-counter products which never really helped.  He was having some periods of frequent loose small watery stools but no real movement and started developing abdominal pain.  He was then seen in the ED above and has been here for 5 days per him.  Tells me that he has been having little bit of increase in bowel movements and able to get a little bit more out and pain is mostly controlled.  He is aware of plan for flex sigmoidoscopy prior to surgery.    He is divorced and takes care of all 3 of his grown children.  Tells me he has never had a colonoscopy.    Denies fever, chills or blood in his stool.  Past medical/Surgical history: NONE  Family History: No GI cancers  Social History   Tobacco Use  . Smoking status: Every Day    Pack years: 0.00    Types: Cigarettes  . Smokeless tobacco: Never  Vaping Use  . Vaping Use: Never used  Substance Use Topics  . Alcohol use: Never   . Drug use: Never    Prior to Admission medications   Medication Sig Start Date End Date Taking? Authorizing Provider  acetaminophen (TYLENOL) 500 MG tablet Take 1,000 mg by mouth daily.   Yes [provider]    Current Facility-Administered Medications  Medication Dose Route Frequency Provider Last Rate Last Admin  . acetaminophen (TYLENOL) tablet 650 mg  650 mg Oral Q6H PRN Winferd Humphrey, PA-C   650 mg at 01/18/21 1111  . cloNIDine (CATAPRES) tablet 0.1 mg  0.1 mg Oral BID PRN Nita Sells, MD   0.1 mg at 01/19/21 1524  . dextrose 5 % with KCl 20 mEq / L  infusion  20 mEq Intravenous Continuous Nita Sells, MD 50 mL/hr at 01/19/21 1155 20 mEq at 01/19/21 1155  . hydrALAZINE (APRESOLINE) injection 5 mg  5 mg Intravenous Q6H PRN Nita Sells, MD      . oxyCODONE (Oxy IR/ROXICODONE) immediate release tablet 5-10 mg  5-10 mg Oral Q4H PRN Richard Miu H, PA-C   10 mg at 01/18/21 2200  . piperacillin-tazobactam (ZOSYN) IVPB 3.375 g  3.375 g Intravenous Q8H Nicolette Bang, MD 12.5 mL/hr at 01/20/21 0346 3.375 g at 01/20/21 0346  . polyethylene glycol (MIRALAX / GLYCOLAX) packet 17 g  17 g Oral Daily Winferd Humphrey, PA-C   17  g at 01/20/21 1025    Allergies as of 01/16/2021  . (No Known Allergies)    Review of Systems:    Constitutional: No weight loss, fever or chills Skin: No rash  Cardiovascular: No chest pain Respiratory: No SOB Gastrointestinal: See HPI and otherwise negative Genitourinary: No dysuria  Neurological: No headache, dizziness or syncope Musculoskeletal: No new muscle or joint pain Hematologic: No bleeding  Psychiatric: No history of depression or anxiety    Physical Exam:  Vital signs in last 24 hours: Temp:  [98.8 F (37.1 C)-99.4 F (37.4 C)] 99.4 F (37.4 C) (06/28 0635) Pulse Rate:  [68-72] 72 (06/28 0635) Resp:  [19-20] 20 (06/28 0635) BP: (151-176)/(69-80) 153/77 (06/28 0635) SpO2:  [96 %-100 %] 98  % (06/28 0635) Last BM Date: 01/20/21 General:   Pleasant male appears to be in NAD, Well developed, Well nourished, alert and cooperative Head:  Normocephalic and atraumatic. Eyes:   PEERL, EOMI. No icterus. Conjunctiva pink. Ears:  Normal auditory acuity. Neck:  Supple Throat: Oral cavity and pharynx without inflammation, swelling or lesion.  Lungs: Respirations even and unlabored. Lungs clear to auscultation bilaterally.   No wheezes, crackles, or rhonchi.  Heart: Normal S1, S2. No MRG. Regular rate and rhythm. No peripheral edema, cyanosis or pallor.  Abdomen:  Soft, nondistended, nontender. No rebound or guarding. Normal bowel sounds. No appreciable masses or hepatomegaly. Rectal:  Not performed.  Msk:  Symmetrical without gross deformities. Peripheral pulses intact.  Extremities:  Without edema, no deformity or joint abnormality.  Neurologic:  Alert and  oriented x4;  grossly normal neurologically. Skin:   Dry and intact without significant lesions or rashes. Psychiatric: Demonstrates good judgement and reason without abnormal affect or behaviors.   LAB RESULTS: Recent Labs    01/19/21 0042 01/20/21 0116  WBC 10.4 11.7*  HGB 10.0* 10.5*  HCT 30.4* 31.5*  PLT 318 333   BMET Recent Labs    01/19/21 0042 01/20/21 0116  NA 135 136  K 3.1* 3.0*  CL 102 101  CO2 24 26  GLUCOSE 96 118*  BUN 7* <5*  CREATININE 0.98 0.93  CALCIUM 7.9* 8.5*   LFT Recent Labs    01/20/21 0116  PROT 6.1*  ALBUMIN 2.3*  AST 17  ALT 12  ALKPHOS 54  BILITOT 0.6    STUDIES: CT ABDOMEN PELVIS W CONTRAST  Result Date: 01/19/2021 CLINICAL DATA:  Abdominal pain. Perforated diverticulitis versus mass on previous study. EXAM: CT ABDOMEN AND PELVIS WITH CONTRAST TECHNIQUE: Multidetector CT imaging of the abdomen and pelvis was performed using the standard protocol following bolus administration of intravenous contrast. CONTRAST:  120mL OMNIPAQUE IOHEXOL 300 MG/ML  SOLN COMPARISON:  01/16/2021  FINDINGS: Lower chest: No pleural or pericardial effusion. Visualized lung bases clear. Hepatobiliary: No focal liver abnormality is seen. No gallstones, gallbladder wall thickening, or biliary dilatation. Pancreas: Unremarkable. No pancreatic ductal dilatation or surrounding inflammatory changes. Spleen: Normal in size without focal abnormality. Adrenals/Urinary Tract: Normal adrenal glands. Bilateral nephrolithiasis, largest 3 mm calculus right mid pole. No hydronephrosis. Urinary bladder nondistended. Stomach/Bowel: Stomach is decompressed. Small bowel unremarkable. Normal appendix. The colon is nondilated. Multiple descending and sigmoid diverticula. Persistent complex peripherally-enhancing mass-like process centered at the rectosigmoid junction with loculated low-attenuation components suggesting necrosis or fluid. The cephalad extent of the process contains some bubbles of gas as before, likely extraluminal. Regional inflammatory/edematous changes have progressed since the previous exam. The process abuts the seminal vesicles anteriorly. Vascular/Lymphatic: Increased number of subcentimeter perirectal lymph  nodes and some borderline enlarged regional mesenteric nodes. Moderate calcified aortoiliac plaque without aneurysm or stenosis. Reproductive: Prostate is unremarkable. Other: No ascites.  No free air. Musculoskeletal: No acute or significant osseous findings. IMPRESSION: 1. Worsening inflammatory changes around the complex rectal process, worrisome for neoplasm with contained perforation. Recommend colonoscopic correlation. Regional metastatic or reactive adenopathy as before. 2.  Aortic Atherosclerosis (ICD10-170.0). 3. Bilateral nephrolithiasis without hydronephrosis. Electronically Signed   By: Lucrezia Europe M.D.   On: 01/19/2021 10:26       Impression / Plan:   Impression: 1.  Bowel perforation with intra-abdominal abscess versus underlying mass lesion: Improving on day 6 of antibiotics, see CT  above 2.  Hepatic steatosis: LFTs normal, incidental finding on imaging  Plan: 1.  Discussed with patient that he is high risk for a flex sig given his bowel perforation and that this procedure may actually worsen his condition.  We also discussed this with the surgical team, but they are persistent that we proceed with flex sigmoidoscopy even given higher risk of complication.  Flexible sigmoidoscopy was scheduled for tomorrow morning with Dr. Silverio Decamp.  This will be unprepped. 2.  Patient will be n.p.o. at midnight. 3.  Please await any further recommendations from Dr. Silverio Decamp later today.  Thank you for your kind consultation, we will continue to follow.  Lavone Nian [facility]  01/20/2021, 11:49 AM     Attending physician's note   I have taken a history, examined the patient and reviewed the chart. I agree with the Advanced Practitioner's note, impression and recommendations.  65 year old very pleasant gentleman admitted with obstipation and abdominal pain CT showed contained perforation in the rectosigmoid with abscess, cannot exclude underlying mass lesion  He is feeling better today, feels abdominal pain is improving and he also started having bowel movements He is on broad-spectrum antibiotics day 5  Discussed case with Dr. Georgette Dover, he feels it will help in surgical management to have preop flexible sigmoidoscopy to visualize the rectosigmoid area and obtain biopsies if there is a mass lesion The visualization of the area could be limited given it will be an unprepped procedure. Discussed in detail with patient the potential risk for perforation with free air and the need for emergent surgery if it happens.  The risks and benefits as well as alternatives of endoscopic procedure(s) have been discussed and reviewed. All questions answered. The patient agrees to proceed.  The patient was provided an opportunity to ask questions and all were answered. The patient agreed with the plan  and demonstrated an understanding of the instructions.  Damaris Hippo , MD [phone]

## 2021-01-20 NOTE — Consult Note (Addendum)
Consultation  Referring Provider: Dr.  Georgette Dover    Primary Care Physician:  Pcp, No Primary Gastroenterologist:   Althia Forts      Reason for Consultation: Perforated diverticulitis, question of colon cancer            HPI:   Curtis [name] is a 65 y.o. male with no significant past medical history who presented originally to the hospital on 6/24 with constipation and abdominal pain.  Been occurring for several weeks.  In the ED found to have possible bowel perforation/perforated diverticulitis with abscess formation versus mass lesion on CT.  General surgery was consulted and is started the patient on antibiotics.  We are consulted by the surgeons to perform flex sigmoidoscopy to try and get biopsy of questionable mass lesion prior to surgery.    Today, the patient tells me that he has been a very healthy individual and is only been to the doctor 13 times that he can remember since [place].  Explains that most recently he started having trouble with constipation and was trying various over-the-counter products which never really helped.  He was having some periods of frequent loose small watery stools but no real movement and started developing abdominal pain.  He was then seen in the ED above and has been here for 5 days per him.  Tells me that he has been having little bit of increase in bowel movements and able to get a little bit more out and pain is mostly controlled.  He is aware of plan for flex sigmoidoscopy prior to surgery.    He is divorced and takes care of all 3 of his grown children.  Tells me he has never had a colonoscopy.    Denies fever, chills or blood in his stool.  Past medical/Surgical history: NONE  Family History: No GI cancers  Social History   Tobacco Use  . Smoking status: Every Day    Pack years: 0.00    Types: Cigarettes  . Smokeless tobacco: Never  Vaping Use  . Vaping Use: Never used  Substance Use Topics  . Alcohol use: Never   . Drug use: Never    Prior to Admission medications   Medication Sig Start Date End Date Taking? Authorizing Provider  acetaminophen (TYLENOL) 500 MG tablet Take 1,000 mg by mouth daily.   Yes [provider]    Current Facility-Administered Medications  Medication Dose Route Frequency Provider Last Rate Last Admin  . acetaminophen (TYLENOL) tablet 650 mg  650 mg Oral Q6H PRN Winferd Humphrey, PA-C   650 mg at 01/18/21 1111  . cloNIDine (CATAPRES) tablet 0.1 mg  0.1 mg Oral BID PRN Nita Sells, MD   0.1 mg at 01/19/21 1524  . dextrose 5 % with KCl 20 mEq / L  infusion  20 mEq Intravenous Continuous Nita Sells, MD 50 mL/hr at 01/19/21 1155 20 mEq at 01/19/21 1155  . hydrALAZINE (APRESOLINE) injection 5 mg  5 mg Intravenous Q6H PRN Nita Sells, MD      . oxyCODONE (Oxy IR/ROXICODONE) immediate release tablet 5-10 mg  5-10 mg Oral Q4H PRN Richard Miu H, PA-C   10 mg at 01/18/21 2200  . piperacillin-tazobactam (ZOSYN) IVPB 3.375 g  3.375 g Intravenous Q8H Nicolette Bang, MD 12.5 mL/hr at 01/20/21 0346 3.375 g at 01/20/21 0346  . polyethylene glycol (MIRALAX / GLYCOLAX) packet 17 g  17 g Oral Daily Winferd Humphrey, PA-C   17  g at 01/20/21 1025    Allergies as of 01/16/2021  . (No Known Allergies)    Review of Systems:    Constitutional: No weight loss, fever or chills Skin: No rash  Cardiovascular: No chest pain Respiratory: No SOB Gastrointestinal: See HPI and otherwise negative Genitourinary: No dysuria  Neurological: No headache, dizziness or syncope Musculoskeletal: No new muscle or joint pain Hematologic: No bleeding  Psychiatric: No history of depression or anxiety    Physical Exam:  Vital signs in last 24 hours: Temp:  [98.8 F (37.1 C)-99.4 F (37.4 C)] 99.4 F (37.4 C) (06/28 0635) Pulse Rate:  [68-72] 72 (06/28 0635) Resp:  [19-20] 20 (06/28 0635) BP: (151-176)/(69-80) 153/77 (06/28 0635) SpO2:  [96 %-100 %] 98  % (06/28 0635) Last BM Date: 01/20/21 General:   Pleasant male appears to be in NAD, Well developed, Well nourished, alert and cooperative Head:  Normocephalic and atraumatic. Eyes:   PEERL, EOMI. No icterus. Conjunctiva pink. Ears:  Normal auditory acuity. Neck:  Supple Throat: Oral cavity and pharynx without inflammation, swelling or lesion.  Lungs: Respirations even and unlabored. Lungs clear to auscultation bilaterally.   No wheezes, crackles, or rhonchi.  Heart: Normal S1, S2. No MRG. Regular rate and rhythm. No peripheral edema, cyanosis or pallor.  Abdomen:  Soft, nondistended, nontender. No rebound or guarding. Normal bowel sounds. No appreciable masses or hepatomegaly. Rectal:  Not performed.  Msk:  Symmetrical without gross deformities. Peripheral pulses intact.  Extremities:  Without edema, no deformity or joint abnormality.  Neurologic:  Alert and  oriented x4;  grossly normal neurologically. Skin:   Dry and intact without significant lesions or rashes. Psychiatric: Demonstrates good judgement and reason without abnormal affect or behaviors.   LAB RESULTS: Recent Labs    01/19/21 0042 01/20/21 0116  WBC 10.4 11.7*  HGB 10.0* 10.5*  HCT 30.4* 31.5*  PLT 318 333   BMET Recent Labs    01/19/21 0042 01/20/21 0116  NA 135 136  K 3.1* 3.0*  CL 102 101  CO2 24 26  GLUCOSE 96 118*  BUN 7* <5*  CREATININE 0.98 0.93  CALCIUM 7.9* 8.5*   LFT Recent Labs    01/20/21 0116  PROT 6.1*  ALBUMIN 2.3*  AST 17  ALT 12  ALKPHOS 54  BILITOT 0.6    STUDIES: CT ABDOMEN PELVIS W CONTRAST  Result Date: 01/19/2021 CLINICAL DATA:  Abdominal pain. Perforated diverticulitis versus mass on previous study. EXAM: CT ABDOMEN AND PELVIS WITH CONTRAST TECHNIQUE: Multidetector CT imaging of the abdomen and pelvis was performed using the standard protocol following bolus administration of intravenous contrast. CONTRAST:  156mL OMNIPAQUE IOHEXOL 300 MG/ML  SOLN COMPARISON:  01/16/2021  FINDINGS: Lower chest: No pleural or pericardial effusion. Visualized lung bases clear. Hepatobiliary: No focal liver abnormality is seen. No gallstones, gallbladder wall thickening, or biliary dilatation. Pancreas: Unremarkable. No pancreatic ductal dilatation or surrounding inflammatory changes. Spleen: Normal in size without focal abnormality. Adrenals/Urinary Tract: Normal adrenal glands. Bilateral nephrolithiasis, largest 3 mm calculus right mid pole. No hydronephrosis. Urinary bladder nondistended. Stomach/Bowel: Stomach is decompressed. Small bowel unremarkable. Normal appendix. The colon is nondilated. Multiple descending and sigmoid diverticula. Persistent complex peripherally-enhancing mass-like process centered at the rectosigmoid junction with loculated low-attenuation components suggesting necrosis or fluid. The cephalad extent of the process contains some bubbles of gas as before, likely extraluminal. Regional inflammatory/edematous changes have progressed since the previous exam. The process abuts the seminal vesicles anteriorly. Vascular/Lymphatic: Increased number of subcentimeter perirectal lymph  nodes and some borderline enlarged regional mesenteric nodes. Moderate calcified aortoiliac plaque without aneurysm or stenosis. Reproductive: Prostate is unremarkable. Other: No ascites.  No free air. Musculoskeletal: No acute or significant osseous findings. IMPRESSION: 1. Worsening inflammatory changes around the complex rectal process, worrisome for neoplasm with contained perforation. Recommend colonoscopic correlation. Regional metastatic or reactive adenopathy as before. 2.  Aortic Atherosclerosis (ICD10-170.0). 3. Bilateral nephrolithiasis without hydronephrosis. Electronically Signed   By: Lucrezia Europe M.D.   On: 01/19/2021 10:26       Impression / Plan:   Impression: 1.  Bowel perforation with intra-abdominal abscess versus underlying mass lesion: Improving on day 6 of antibiotics, see CT  above 2.  Hepatic steatosis: LFTs normal, incidental finding on imaging  Plan: 1.  Discussed with patient that he is high risk for a flex sig given his bowel perforation and that this procedure may actually worsen his condition.  We also discussed this with the surgical team, but they are persistent that we proceed with flex sigmoidoscopy even given higher risk of complication.  Flexible sigmoidoscopy was scheduled for tomorrow morning with Dr. Silverio Decamp.  This will be unprepped. 2.  Patient will be n.p.o. at midnight. 3.  Please await any further recommendations from Dr. Silverio Decamp later today.  Thank you for your kind consultation, we will continue to follow.  Lavone Nian [facility]  01/20/2021, 11:49 AM     Attending physician's note   I have taken a history, examined the patient and reviewed the chart. I agree with the Advanced Practitioner's note, impression and recommendations.  65 year old very pleasant gentleman admitted with obstipation and abdominal pain CT showed contained perforation in the rectosigmoid with abscess, cannot exclude underlying mass lesion  He is feeling better today, feels abdominal pain is improving and he also started having bowel movements He is on broad-spectrum antibiotics day 5  Discussed case with Dr. Georgette Dover, he feels it will help in surgical management to have preop flexible sigmoidoscopy to visualize the rectosigmoid area and obtain biopsies if there is a mass lesion The visualization of the area could be limited given it will be an unprepped procedure. Discussed in detail with patient the potential risk for perforation with free air and the need for emergent surgery if it happens.  The risks and benefits as well as alternatives of endoscopic procedure(s) have been discussed and reviewed. All questions answered. The patient agrees to proceed.  The patient was provided an opportunity to ask questions and all were answered. The patient agreed with the plan  and demonstrated an understanding of the instructions.  Damaris Hippo , MD [phone]

## 2021-01-21 ENCOUNTER — Inpatient Hospital Stay (HOSPITAL_COMMUNITY): Payer: Self-pay | Admitting: Anesthesiology

## 2021-01-21 ENCOUNTER — Encounter (HOSPITAL_COMMUNITY): Admission: EM | Disposition: A | Discharge status: Home Health | Payer: Self-pay | Source: Home / Self Care

## 2021-01-21 ENCOUNTER — Encounter (HOSPITAL_COMMUNITY): Payer: Self-pay | Admitting: Gastroenterology

## 2021-01-21 DIAGNOSIS — K6389 Other specified diseases of intestine: Secondary | ICD-10-CM

## 2021-01-21 DIAGNOSIS — D490 Neoplasm of unspecified behavior of digestive system: Secondary | ICD-10-CM

## 2021-01-21 DIAGNOSIS — K5669 Other partial intestinal obstruction: Secondary | ICD-10-CM

## 2021-01-21 HISTORY — PX: BIOPSY: SHX5522

## 2021-01-21 HISTORY — PX: COLON RESECTION: SHX5231

## 2021-01-21 HISTORY — PX: FLEXIBLE SIGMOIDOSCOPY: SHX5431

## 2021-01-21 HISTORY — PX: SUBMUCOSAL TATTOO INJECTION: SHX6856

## 2021-01-21 LAB — COMPREHENSIVE METABOLIC PANEL
ALT: 13 U/L (ref 0–44)
AST: 16 U/L (ref 15–41)
Albumin: 2.4 g/dL — ABNORMAL LOW (ref 3.5–5.0)
Alkaline Phosphatase: 58 U/L (ref 38–126)
Anion gap: 10 (ref 5–15)
BUN: <5 mg/dL — ABNORMAL LOW (ref 8–23)
CO2: 27 mmol/L (ref 22–32)
Calcium: 8.7 mg/dL — ABNORMAL LOW (ref 8.9–10.3)
Chloride: 99 mmol/L (ref 98–111)
Creatinine, Ser: 0.95 mg/dL (ref 0.61–1.24)
GFR, Estimated: >60 mL/min (ref >60)
Glucose, Bld: 133 mg/dL — ABNORMAL HIGH (ref 70–99)
Potassium: 2.9 mmol/L — ABNORMAL LOW (ref 3.5–5.1)
Sodium: 136 mmol/L (ref 135–145)
Total Bilirubin: 0.6 mg/dL (ref 0.3–1.2)
Total Protein: 6.1 g/dL — ABNORMAL LOW (ref 6.5–8.1)

## 2021-01-21 LAB — CBC WITH DIFFERENTIAL/PLATELET
Abs Immature Granulocytes: 0.06 10*3/uL (ref 0.00–0.07)
Basophils Absolute: 0 10*3/uL (ref 0.0–0.1)
Basophils Relative: 0 %
Eosinophils Absolute: 0.2 10*3/uL (ref 0.0–0.5)
Eosinophils Relative: 2 %
HCT: 31.9 % — ABNORMAL LOW (ref 39.0–52.0)
Hemoglobin: 10.9 g/dL — ABNORMAL LOW (ref 13.0–17.0)
Immature Granulocytes: 1 %
Lymphocytes Relative: 8 %
Lymphs Abs: 0.9 10*3/uL (ref 0.7–4.0)
MCH: 28.5 pg (ref 26.0–34.0)
MCHC: 34.2 g/dL (ref 30.0–36.0)
MCV: 83.3 fL (ref 80.0–100.0)
Monocytes Absolute: 0.7 10*3/uL (ref 0.1–1.0)
Monocytes Relative: 6 %
Neutro Abs: 9.4 10*3/uL — ABNORMAL HIGH (ref 1.7–7.7)
Neutrophils Relative %: 83 %
Platelets: 353 10*3/uL (ref 150–400)
RBC: 3.83 MIL/uL — ABNORMAL LOW (ref 4.22–5.81)
RDW: 12.3 % (ref 11.5–15.5)
WBC: 11.3 10*3/uL — ABNORMAL HIGH (ref 4.0–10.5)
nRBC: 0 % (ref 0.0–0.2)

## 2021-01-21 SURGERY — SIGMOIDOSCOPY, FLEXIBLE
Anesthesia: Monitor Anesthesia Care

## 2021-01-21 SURGERY — LAPAROSCOPIC SIGMOID COLON RESECTION
Anesthesia: General | Site: Abdomen

## 2021-01-21 MED ORDER — SODIUM CHLORIDE 0.9 % IR SOLN
Status: DC | PRN
  Administered 2021-01-21: 1000 mL

## 2021-01-21 MED ORDER — CHLORHEXIDINE GLUCONATE 0.12 % MT SOLN: 15.0000 mL | Freq: Once | OROMUCOSAL | Status: AC

## 2021-01-21 MED ORDER — SODIUM CHLORIDE 0.9 % IV SOLN: INTRAVENOUS | Status: DC

## 2021-01-21 MED ORDER — FENTANYL CITRATE (PF) 250 MCG/5ML IJ SOLN
INTRAMUSCULAR | Status: DC | PRN
  Administered 2021-01-21: 100 ug via INTRAVENOUS
  Administered 2021-01-21 (×2): 50 ug via INTRAVENOUS
  Administered 2021-01-21: 100 ug via INTRAVENOUS

## 2021-01-21 MED ORDER — LACTATED RINGERS IV SOLN: INTRAVENOUS | Status: DC

## 2021-01-21 MED ORDER — ENOXAPARIN SODIUM 40 MG/0.4ML IJ SOSY
40.0000 mg | PREFILLED_SYRINGE | INTRAMUSCULAR | Status: DC
  Administered 2021-01-22 – 2021-01-23 (×2): 40 mg via SUBCUTANEOUS
  Filled 2021-01-21 (×2): qty 0.4

## 2021-01-21 MED ORDER — CHLORHEXIDINE GLUCONATE 0.12 % MT SOLN
OROMUCOSAL | Status: AC
  Administered 2021-01-21: 15 mL via OROMUCOSAL
  Filled 2021-01-21: qty 15

## 2021-01-21 MED ORDER — LIDOCAINE 2% (20 MG/ML) 5 ML SYRINGE
INTRAMUSCULAR | Status: DC | PRN
  Administered 2021-01-21: 80 mg via INTRAVENOUS

## 2021-01-21 MED ORDER — ONDANSETRON HCL 4 MG/2ML IJ SOLN
INTRAMUSCULAR | Status: DC | PRN
  Administered 2021-01-21: 4 mg via INTRAVENOUS

## 2021-01-21 MED ORDER — FENTANYL CITRATE (PF) 250 MCG/5ML IJ SOLN
INTRAMUSCULAR | Status: AC
  Filled 2021-01-21: qty 5

## 2021-01-21 MED ORDER — OXYCODONE HCL 5 MG PO TABS: 5.0000 mg | ORAL_TABLET | Freq: Once | ORAL | Status: DC | PRN

## 2021-01-21 MED ORDER — FENTANYL CITRATE (PF) 100 MCG/2ML IJ SOLN
25.0000 ug | INTRAMUSCULAR | Status: DC | PRN
  Administered 2021-01-21: 50 ug via INTRAVENOUS

## 2021-01-21 MED ORDER — ACETAMINOPHEN 160 MG/5ML PO SOLN: 325.0000 mg | ORAL | Status: DC | PRN

## 2021-01-21 MED ORDER — ACETAMINOPHEN 325 MG PO TABS: 325.0000 mg | ORAL_TABLET | ORAL | Status: DC | PRN

## 2021-01-21 MED ORDER — SUGAMMADEX SODIUM 200 MG/2ML IV SOLN
INTRAVENOUS | Status: DC | PRN
  Administered 2021-01-21: 200 mg via INTRAVENOUS

## 2021-01-21 MED ORDER — PROPOFOL 10 MG/ML IV BOLUS
INTRAVENOUS | Status: DC | PRN
  Administered 2021-01-21: 50 mg via INTRAVENOUS
  Administered 2021-01-21: 150 mg via INTRAVENOUS

## 2021-01-21 MED ORDER — PROPOFOL 10 MG/ML IV BOLUS
INTRAVENOUS | Status: AC
  Filled 2021-01-21: qty 20

## 2021-01-21 MED ORDER — OXYCODONE HCL 5 MG/5ML PO SOLN: 5.0000 mg | Freq: Once | ORAL | Status: DC | PRN

## 2021-01-21 MED ORDER — POTASSIUM CHLORIDE 10 MEQ/100ML IV SOLN
10.0000 meq | INTRAVENOUS | Status: AC
  Administered 2021-01-21 (×3): 10 meq via INTRAVENOUS
  Filled 2021-01-21 (×2): qty 100

## 2021-01-21 MED ORDER — DEXAMETHASONE SODIUM PHOSPHATE 10 MG/ML IJ SOLN
INTRAMUSCULAR | Status: DC | PRN
  Administered 2021-01-21: 4 mg via INTRAVENOUS

## 2021-01-21 MED ORDER — ORAL CARE MOUTH RINSE: 15.0000 mL | Freq: Once | OROMUCOSAL | Status: AC

## 2021-01-21 MED ORDER — OXYCODONE HCL 5 MG PO TABS
5.0000 mg | ORAL_TABLET | ORAL | Status: DC | PRN
  Administered 2021-01-21 – 2021-01-23 (×5): 10 mg via ORAL
  Filled 2021-01-21 (×5): qty 2

## 2021-01-21 MED ORDER — MIDAZOLAM HCL 2 MG/2ML IJ SOLN
INTRAMUSCULAR | Status: AC
  Filled 2021-01-21: qty 2

## 2021-01-21 MED ORDER — ONDANSETRON HCL 4 MG/2ML IJ SOLN: 4.0000 mg | Freq: Once | INTRAMUSCULAR | Status: DC | PRN

## 2021-01-21 MED ORDER — MEPERIDINE HCL 25 MG/ML IJ SOLN: 6.2500 mg | INTRAMUSCULAR | Status: DC | PRN

## 2021-01-21 MED ORDER — MIDAZOLAM HCL 5 MG/5ML IJ SOLN
INTRAMUSCULAR | Status: DC | PRN
  Administered 2021-01-21: 2 mg via INTRAVENOUS

## 2021-01-21 MED ORDER — ACETAMINOPHEN 500 MG PO TABS
1000.0000 mg | ORAL_TABLET | Freq: Four times a day (QID) | ORAL | Status: DC
  Administered 2021-01-21 – 2021-01-23 (×7): 1000 mg via ORAL
  Filled 2021-01-21 (×8): qty 2

## 2021-01-21 MED ORDER — PROPOFOL 10 MG/ML IV BOLUS
INTRAVENOUS | Status: DC | PRN
  Administered 2021-01-21: 70 mg via INTRAVENOUS
  Administered 2021-01-21 (×5): 20 mg via INTRAVENOUS

## 2021-01-21 MED ORDER — BUPIVACAINE-EPINEPHRINE (PF) 0.25% -1:200000 IJ SOLN
INTRAMUSCULAR | Status: DC | PRN
  Administered 2021-01-21: 8 mL via PERINEURAL

## 2021-01-21 MED ORDER — ROCURONIUM BROMIDE 10 MG/ML (PF) SYRINGE
PREFILLED_SYRINGE | INTRAVENOUS | Status: DC | PRN
  Administered 2021-01-21 (×2): 50 mg via INTRAVENOUS

## 2021-01-21 MED ORDER — HYDROMORPHONE HCL 1 MG/ML IJ SOLN: 0.5000 mg | INTRAMUSCULAR | Status: DC | PRN

## 2021-01-21 MED ORDER — FENTANYL CITRATE (PF) 100 MCG/2ML IJ SOLN
INTRAMUSCULAR | Status: AC
  Filled 2021-01-21: qty 2

## 2021-01-21 MED ORDER — BUPIVACAINE HCL (PF) 0.25 % IJ SOLN
INTRAMUSCULAR | Status: AC
  Filled 2021-01-21: qty 30

## 2021-01-21 SURGICAL SUPPLY — 72 items
APPLIER CLIP 5 13 M/L LIGAMAX5 (MISCELLANEOUS)
APPLIER CLIP ROT 10 11.4 M/L (STAPLE)
BAG COUNTER SPONGE SURGICOUNT (BAG) ×2 IMPLANT
BLADE CLIPPER SURG (BLADE) IMPLANT
CANISTER SUCT 3000ML PPV (MISCELLANEOUS) ×2 IMPLANT
CATH ROBINSON RED A/P 14FR (CATHETERS) ×2 IMPLANT
CELLS DAT CNTRL 66122 CELL SVR (MISCELLANEOUS) IMPLANT
CHLORAPREP W/TINT 26 (MISCELLANEOUS) ×2 IMPLANT
CLIP APPLIE 5 13 M/L LIGAMAX5 (MISCELLANEOUS) IMPLANT
CLIP APPLIE ROT 10 11.4 M/L (STAPLE) IMPLANT
COVER MAYO STAND STRL (DRAPES) ×4 IMPLANT
COVER SURGICAL LIGHT HANDLE (MISCELLANEOUS) ×2 IMPLANT
DECANTER SPIKE VIAL GLASS SM (MISCELLANEOUS) ×2 IMPLANT
DERMABOND ADVANCED (GAUZE/BANDAGES/DRESSINGS) ×1
DERMABOND ADVANCED .7 DNX12 (GAUZE/BANDAGES/DRESSINGS) ×1 IMPLANT
DRAPE HALF SHEET 40X57 (DRAPES) ×4 IMPLANT
DRAPE UTILITY XL STRL (DRAPES) ×2 IMPLANT
DRSG OPSITE POSTOP 4X10 (GAUZE/BANDAGES/DRESSINGS) IMPLANT
DRSG OPSITE POSTOP 4X8 (GAUZE/BANDAGES/DRESSINGS) IMPLANT
ELECT BLADE 4.0 EZ CLEAN MEGAD (MISCELLANEOUS) ×2
ELECT BLADE 6.5 EXT (BLADE) ×2 IMPLANT
ELECT CAUTERY BLADE 6.4 (BLADE) ×2 IMPLANT
ELECT REM PT RETURN 9FT ADLT (ELECTROSURGICAL) ×2
ELECTRODE BLDE 4.0 EZ CLN MEGD (MISCELLANEOUS) ×1 IMPLANT
ELECTRODE REM PT RTRN 9FT ADLT (ELECTROSURGICAL) ×1 IMPLANT
GAUZE 4X4 16PLY ~~LOC~~+RFID DBL (SPONGE) ×2 IMPLANT
GEL ULTRASOUND 20GR AQUASONIC (MISCELLANEOUS) IMPLANT
GLOVE SURG ENC MOIS LTX SZ7 (GLOVE) ×4 IMPLANT
GLOVE SURG UNDER POLY LF SZ7.5 (GLOVE) ×4 IMPLANT
GOWN STRL REUS W/ TWL LRG LVL3 (GOWN DISPOSABLE) ×6 IMPLANT
GOWN STRL REUS W/TWL LRG LVL3 (GOWN DISPOSABLE) ×6
KIT BASIN OR (CUSTOM PROCEDURE TRAY) ×2 IMPLANT
KIT OSTOMY DRAINABLE 2.75 STR (WOUND CARE) ×2 IMPLANT
KIT SIGMOIDOSCOPE (SET/KITS/TRAYS/PACK) IMPLANT
KIT TURNOVER KIT B (KITS) ×2 IMPLANT
LEGGING LITHOTOMY PAIR STRL (DRAPES) IMPLANT
LIGASURE IMPACT 36 18CM CVD LR (INSTRUMENTS) IMPLANT
LIGASURE MARYLAND LAP STAND (ELECTROSURGICAL) IMPLANT
NS IRRIG 1000ML POUR BTL (IV SOLUTION) ×4 IMPLANT
PAD ARMBOARD 7.5X6 YLW CONV (MISCELLANEOUS) ×4 IMPLANT
PENCIL BUTTON HOLSTER BLD 10FT (ELECTRODE) ×4 IMPLANT
RTRCTR WOUND ALEXIS 18CM MED (MISCELLANEOUS)
SCISSORS LAP 5X35 DISP (ENDOMECHANICALS) ×2 IMPLANT
SET IRRIG TUBING LAPAROSCOPIC (IRRIGATION / IRRIGATOR) IMPLANT
SET TUBE SMOKE EVAC HIGH FLOW (TUBING) ×2 IMPLANT
SHEARS HARMONIC ACE PLUS 36CM (ENDOMECHANICALS) IMPLANT
SLEEVE ENDOPATH XCEL 5M (ENDOMECHANICALS) ×4 IMPLANT
SPECIMEN JAR LARGE (MISCELLANEOUS) ×2 IMPLANT
SPONGE T-LAP 18X18 ~~LOC~~+RFID (SPONGE) ×2 IMPLANT
STAPLER VISISTAT 35W (STAPLE) ×2 IMPLANT
SURGILUBE 2OZ TUBE FLIPTOP (MISCELLANEOUS) IMPLANT
SUT ETHILON 3 0 FSL (SUTURE) ×2 IMPLANT
SUT MNCRL AB 4-0 PS2 18 (SUTURE) ×2 IMPLANT
SUT PROLENE 2 0 CT2 30 (SUTURE) IMPLANT
SUT PROLENE 2 0 KS (SUTURE) IMPLANT
SUT SILK 2 0 SH CR/8 (SUTURE) IMPLANT
SUT SILK 2 0 TIES 10X30 (SUTURE) ×2 IMPLANT
SUT SILK 3 0 SH CR/8 (SUTURE) ×2 IMPLANT
SUT SILK 3 0 TIES 10X30 (SUTURE) ×2 IMPLANT
SUT VIC AB 3-0 SH 18 (SUTURE) ×2 IMPLANT
SYR BULB IRRIG 60ML STRL (SYRINGE) ×4 IMPLANT
SYS LAPSCP GELPORT 120MM (MISCELLANEOUS)
SYSTEM LAPSCP GELPORT 120MM (MISCELLANEOUS) IMPLANT
TOWEL GREEN STERILE (TOWEL DISPOSABLE) ×4 IMPLANT
TRAY FOLEY MTR SLVR 14FR STAT (SET/KITS/TRAYS/PACK) ×2 IMPLANT
TRAY LAPAROSCOPIC MC (CUSTOM PROCEDURE TRAY) ×2 IMPLANT
TROCAR XCEL BLUNT TIP 100MML (ENDOMECHANICALS) ×2 IMPLANT
TROCAR XCEL NON-BLD 11X100MML (ENDOMECHANICALS) IMPLANT
TROCAR XCEL NON-BLD 5MMX100MML (ENDOMECHANICALS) ×2 IMPLANT
TUBE CONNECTING 12X1/4 (SUCTIONS) ×4 IMPLANT
WATER STERILE IRR 1000ML POUR (IV SOLUTION) ×2 IMPLANT
YANKAUER SUCT BULB TIP NO VENT (SUCTIONS) ×4 IMPLANT

## 2021-01-21 NOTE — Op Note (Signed)
Preop diagnosis: Perforated rectosigmoid cancer Postop diagnosis: Same Procedure performed: Laparoscopic assisted loop colostomy Surgeon:Afsheen Antony K Hatsumi Steinhart Anesthesia: General endotracheal Indications: This is a 65 year old male who presented with a mass showing significant inflammation deep in the pelvis.  This was felt to be either perforated diverticulitis with abscess versus perforated rectosigmoid cancer.  Further work-up revealed that this is most likely a malignancy.  Biopsies are pending.  We recommended a diverting loop colostomy with hopes of treating the patient with neoadjuvant chemotherapy and radiation.  Description of procedure: The patient is brought to the operating room and placed in the supine position on the operating room table.  After an adequate level of general anesthesia was obtained, his abdomen was prepped with ChloraPrep and draped sterile fashion.  A timeout was taken to ensure the proper patient and proper procedure.  We made a 5 mm incision below the right costal margin.  A 5 mm Optiview trocar was used to cannulate the peritoneal cavity.  We insufflated CO2 maintaining a maximum pressure of 15 mmHg.  The laparoscope was inserted.  There is no ascites or gross purulence.  Examination of the liver showed no obvious metastatic disease.  He has some omental adhesions in the left upper quadrant.  A 5 mm port was placed in the right lower quadrant.  We used cautery scissors to take down the adhesions in the left upper quadrant.  We identified the descending colon.  We mobilized the descending colon away from the lateral abdominal wall.  We placed an additional 5 mm port through the area that had been marked for his colostomy.  We mobilized the lateral attachments all the way down to the sigmoid colon as well as up to the splenic flexure.  Once I felt that we had adequate mobilization of the mid descending colon we brought the colon up to the anterior abdominal wall at the left-sided  port site.  We released our insufflation.  I remove that 5 mm port and we excised a circle of skin and subcutaneous fat.  The fascia was incised in a cruciate incision.  We entered the peritoneal cavity.  We then mobilized the descending colon up into the wound as a loop colostomy.  A small opening was created in the mesentery of the loop colostomy and a red rubber catheter was passed through this area.  Even though we had a lot of mobilization internally the loop colostomy does not really evert at all.  We mobilized as much as possible.  We then matured the colostomy with 3-0 Vicryl sutures.  The red rubber catheter was cut to length and was secured with 2-0 nylon sutures.  An ostomy appliance was applied.  The ports on the right side were closed with 4-0 Monocryl and sealed with Dermabond.  The patient was then extubated and brought to the recovery room in stable condition.  All sponge, instrument, and needle counts are correct.  Imogene Burn. Georgette Dover, MD, [facility]  General Surgery   01/21/2021 4:04 PM

## 2021-01-21 NOTE — Anesthesia Procedure Notes (Signed)
Procedure Name: Intubation Date/Time: 01/21/2021 1:42 PM Performed by: Renato Shin, CRNA Pre-anesthesia Checklist: Patient identified, Emergency Drugs available, Suction available and Patient being monitored Patient Re-evaluated:Patient Re-evaluated prior to induction Oxygen Delivery Method: Circle system utilized Preoxygenation: Pre-oxygenation with 100% oxygen Induction Type: IV induction Ventilation: Mask ventilation without difficulty Laryngoscope Size: Miller and 2 Grade View: Grade I Tube type: Oral Tube size: 7.5 mm Number of attempts: 1 Airway Equipment and Method: Stylet and Oral airway Placement Confirmation: ETT inserted through vocal cords under direct vision, positive ETCO2 and breath sounds checked- equal and bilateral Secured at: 21 cm Tube secured with: Tape Dental Injury: Teeth and Oropharynx as per pre-operative assessment

## 2021-01-21 NOTE — Anesthesia Preprocedure Evaluation (Addendum)
Anesthesia Evaluation  Patient identified by MRN, date of birth, ID band Patient awake    Reviewed: Allergy & Precautions, NPO status , Patient's Chart, lab work & pertinent test results  Airway Mallampati: II  TM Distance: >3 FB Neck ROM: Full    Dental  (+) Poor Dentition, Missing, Upper Dentures, Partial Lower   Pulmonary neg pulmonary ROS, Current Smoker and Patient abstained from smoking.,    Pulmonary exam normal        Cardiovascular negative cardio ROS   Rhythm:Regular Rate:Normal     Neuro/Psych negative neurological ROS  negative psych ROS   GI/Hepatic Neg liver ROS, Constipation, rectosigmoid mass   Endo/Other  negative endocrine ROS  Renal/GU negative Renal ROS  negative genitourinary   Musculoskeletal negative musculoskeletal ROS (+)   Abdominal (+)  Abdomen: soft. Bowel sounds: normal.  Peds  Hematology negative hematology ROS (+)   Anesthesia Other Findings   Reproductive/Obstetrics                            Anesthesia Physical Anesthesia Plan  ASA: 2  Anesthesia Plan: MAC   Post-op Pain Management:    Induction: Intravenous  PONV Risk Score and Plan: 0 and Propofol infusion and Treatment may vary due to age or medical condition  Airway Management Planned: Simple Face Mask, Natural Airway and Nasal Cannula  Additional Equipment: None  Intra-op Plan:   Post-operative Plan:   Informed Consent: I have reviewed the patients History and Physical, chart, labs and discussed the procedure including the risks, benefits and alternatives for the proposed anesthesia with the patient or authorized representative who has indicated his/her understanding and acceptance.     Dental advisory given  Plan Discussed with: CRNA  Anesthesia Plan Comments: (Lab Results      Component                Value               Date                      WBC                      11.3  (H)            01/21/2021                HGB                      10.9 (L)            01/21/2021                HCT                      31.9 (L)            01/21/2021                MCV                      83.3                01/21/2021                PLT                      353  01/21/2021           Lab Results      Component                Value               Date                      NA                       136                 01/21/2021                K                        2.9 (L)             01/21/2021                CO2                      27                  01/21/2021                GLUCOSE                  133 (H)             01/21/2021                BUN                      <5 (L)              01/21/2021                CREATININE               0.95                01/21/2021                CALCIUM                  8.7 (L)             01/21/2021                GFRNONAA                 >60                 01/21/2021          )        Anesthesia Quick Evaluation

## 2021-01-21 NOTE — Interval H&P Note (Signed)
History and Physical Interval Note:  01/21/2021 8:11 AM  Curtis [name]  has presented today for surgery, with the diagnosis of Rectosigmoid mass.  The various methods of treatment have been discussed with the patient and family. After consideration of risks, benefits and other options for treatment, the patient has consented to  Procedure(s): FLEXIBLE SIGMOIDOSCOPY (N/A) as a surgical intervention.  The patient's history has been reviewed, patient examined, no change in status, stable for surgery.  I have reviewed the patient's chart and labs.  Questions were answered to the patient's satisfaction.     [name]

## 2021-01-21 NOTE — Progress Notes (Signed)
   Progress Note  Day of Surgery  Subjective: CC: tolerated flex/sig well yesterday. He continues to have bowel function and denies abdominal pain, nausea, emesis. Agreeable to go to the OR today   Objective: Vital signs in last 24 hours: Temp:  [97.9 F (36.6 C)-98.8 F (37.1 C)] 98.4 F (36.9 C) (06/29 0956) Pulse Rate:  [60-87] 62 (06/29 0956) Resp:  [17-23] 20 (06/29 0956) BP: (92-167)/(50-82) 153/75 (06/29 0956) SpO2:  [94 %-99 %] 99 % (06/29 0956) Last BM Date: 01/20/21  Intake/Output from previous day: 06/28 0701 - 06/29 0700 In: 1741.3 [P.O.:960; I.V.:597.5; IV Piggyback:183.8] Out: -  Intake/Output this shift: Total I/O In: 200 [I.V.:200] Out: -   PE: General: pleasant, WD, male who is laying in bed in NAD HEENT: head is normocephalic, atraumatic.  Mouth is pink and moist Heart: no cyanosis Lungs: Respiratory effort nonlabored Abd: soft, NT, ND MS: all 4 extremities are symmetrical with no cyanosis, clubbing, or edema. Skin: warm and dry with no masses, lesions, or rashes Psych: A&Ox3 with an appropriate affect.    Lab Results:  Recent Labs    01/20/21 0116 01/21/21 0312  WBC 11.7* 11.3*  HGB 10.5* 10.9*  HCT 31.5* 31.9*  PLT 333 353   BMET Recent Labs    01/20/21 0116 01/21/21 0312  NA 136 136  K 3.0* 2.9*  CL 101 99  CO2 26 27  GLUCOSE 118* 133*  BUN <5* <5*  CREATININE 0.93 0.95  CALCIUM 8.5* 8.7*   PT/INR No results for input(s): LABPROT, INR in the last 72 hours. CMP     Component Value Date/Time   NA 136 01/21/2021 0312   K 2.9 (L) 01/21/2021 0312   CL 99 01/21/2021 0312   CO2 27 01/21/2021 0312   GLUCOSE 133 (H) 01/21/2021 0312   BUN <5 (L) 01/21/2021 0312   CREATININE 0.95 01/21/2021 0312   CALCIUM 8.7 (L) 01/21/2021 0312   PROT 6.1 (L) 01/21/2021 0312   ALBUMIN 2.4 (L) 01/21/2021 0312   AST 16 01/21/2021 0312   ALT 13 01/21/2021 0312   ALKPHOS 58 01/21/2021 0312   BILITOT 0.6 01/21/2021 0312   GFRNONAA >60 01/21/2021  0312   Lipase     Component Value Date/Time   LIPASE 20 01/16/2021 1515       Studies/Results: No results found.  Anti-infectives: Anti-infectives (From admission, onward)    Start     Dose/Rate Route Frequency Ordered Stop   01/16/21 2000  piperacillin-tazobactam (ZOSYN) IVPB 3.375 g        3.375 g 12.5 mL/hr over 240 Minutes Intravenous Every 8 hours 01/16/21 1933          Assessment/Plan  Perforated mass in distal sigmoid/ rectum - likely perforated malignancy - CEA - 5.8, CA125 - 5.1 - Flex/sig yesterday with biopsy pending - likely OR diverting loop colostomy today. WOC has seen - Pending pathology from biopsy - probable neoadjuvant chemotherapy/radiation before definitive resection. - WBC 11.3, monitor - hypokalemia - IV repletion ordered - has been tolerating clear liquids and having bowel function  FEN: NPO for OR, IVF, replete K IV ID: [email or web address] 6/24>> VTE: SCDs   LOS: 5 days    Winferd Humphrey, [facility] 01/21/2021, 10:23 AM Please see Amion for pager number during day hours 7:00am-4:30pm

## 2021-01-21 NOTE — Progress Notes (Signed)
Pt ostomy is producing stools, change ostomy assembly again. Clears delivered. Pt ambulated to bathroom. Tolerated well.

## 2021-01-21 NOTE — Transfer of Care (Signed)
Immediate Anesthesia Transfer of Care Note  Patient: Curtis [name]  Procedure(s) Performed: [place] TATTOO INJECTION  Patient Location: PACU  Anesthesia Type:MAC  Level of Consciousness: drowsy  Airway & Oxygen Therapy: Patient Spontanous Breathing  Post-op Assessment: Report given to RN and Post -op Vital signs reviewed and stable  Post vital signs: Reviewed and stable  Last Vitals:  Vitals Value Taken Time  BP 92/50 01/21/21 0905  Temp    Pulse 63 01/21/21 0906  Resp 22 01/21/21 0906  SpO2 94 % 01/21/21 0906  Vitals shown include unvalidated device data.  Last Pain:  Vitals:   01/21/21 0905  TempSrc: Oral  PainSc:       Patients Stated Pain Goal: 0 (76/28/31 5176)  Complications: No notable events documented.

## 2021-01-21 NOTE — Plan of Care (Signed)

## 2021-01-21 NOTE — Transfer of Care (Signed)
Immediate Anesthesia Transfer of Care Note  Patient: Curtis [name]  Procedure(s) Performed: LAPAROSCOPIC ASSISTED LOOP COLOSTOMY (Abdomen)  Patient Location: PACU  Anesthesia Type:General  Level of Consciousness: awake, alert  and oriented  Airway & Oxygen Therapy: Patient Spontanous Breathing  Post-op Assessment: Report given to RN and Post -op Vital signs reviewed and stable  Post vital signs: Reviewed and stable  Last Vitals:  Vitals Value Taken Time  BP 156/83 01/21/21 1559  Temp 36.6 C 01/21/21 1558  Pulse 74 01/21/21 1603  Resp 15 01/21/21 1603  SpO2 95 % 01/21/21 1603  Vitals shown include unvalidated device data.  Last Pain:  Vitals:   01/21/21 1233  TempSrc: Oral  PainSc:       Patients Stated Pain Goal: 0 (86/38/17 7116)  Complications: No notable events documented.

## 2021-01-21 NOTE — Anesthesia Preprocedure Evaluation (Signed)
Anesthesia Evaluation  Patient identified by MRN, date of birth, ID band Patient awake    Reviewed: Allergy & Precautions, NPO status , Patient's Chart, lab work & pertinent test results  Airway Mallampati: II  TM Distance: >3 FB Neck ROM: Full    Dental  (+) Poor Dentition, Missing, Upper Dentures, Partial Lower   Pulmonary neg pulmonary ROS, Current Smoker and Patient abstained from smoking.,    Pulmonary exam normal        Cardiovascular hypertension,  Rhythm:Regular Rate:Normal     Neuro/Psych negative neurological ROS  negative psych ROS   GI/Hepatic Neg liver ROS, Constipation, rectosigmoid mass   Endo/Other  negative endocrine ROS  Renal/GU negative Renal ROS  negative genitourinary   Musculoskeletal negative musculoskeletal ROS (+)   Abdominal (+)  Abdomen: soft. Bowel sounds: normal.  Peds  Hematology  (+) Blood dyscrasia, anemia ,   Anesthesia Other Findings   Reproductive/Obstetrics                             Anesthesia Physical  Anesthesia Plan  ASA: 3 and emergent  Anesthesia Plan: General   Post-op Pain Management:    Induction: Intravenous  PONV Risk Score and Plan: 2 and Treatment may vary due to age or medical condition, Ondansetron and Dexamethasone  Airway Management Planned: Nasal Cannula and Oral ETT  Additional Equipment: None  Intra-op Plan:   Post-operative Plan: Extubation in OR  Informed Consent: I have reviewed the patients History and Physical, chart, labs and discussed the procedure including the risks, benefits and alternatives for the proposed anesthesia with the patient or authorized representative who has indicated his/her understanding and acceptance.     Dental advisory given  Plan Discussed with: CRNA and Anesthesiologist  Anesthesia Plan Comments: (Lab Results      Component                Value               Date                       WBC                      11.3 (H)            01/21/2021                HGB                      10.9 (L)            01/21/2021                HCT                      31.9 (L)            01/21/2021                MCV                      83.3                01/21/2021                PLT  353                 01/21/2021           Lab Results      Component                Value               Date                      NA                       136                 01/21/2021                K                        2.9 (L)             01/21/2021                CO2                      27                  01/21/2021                GLUCOSE                  133 (H)             01/21/2021                BUN                      <5 (L)              01/21/2021                CREATININE               0.95                01/21/2021                CALCIUM                  8.7 (L)             01/21/2021                GFRNONAA                 >60                 01/21/2021          )        Anesthesia Quick Evaluation

## 2021-01-21 NOTE — Anesthesia Postprocedure Evaluation (Signed)
Anesthesia Post Note  Patient: Curtis [name]  Procedure(s) Performed: LAPAROSCOPIC ASSISTED LOOP COLOSTOMY (Abdomen)     Patient location during evaluation: PACU Anesthesia Type: General Level of consciousness: awake and alert Pain management: pain level controlled Vital Signs Assessment: post-procedure vital signs reviewed and stable Respiratory status: spontaneous breathing, nonlabored ventilation, respiratory function stable and patient connected to nasal cannula oxygen Cardiovascular status: blood pressure returned to baseline and stable Postop Assessment: no apparent nausea or vomiting Anesthetic complications: no   No notable events documented.  Last Vitals:  Vitals:   01/21/21 1615 01/21/21 1630  BP: (!) 159/79 (!) 148/80  Pulse: 74 73  Resp: 20 19  Temp:    SpO2: 93% 96%    Last Pain:  Vitals:   01/21/21 1630  TempSrc:   PainSc: Asleep                 Jhayden Demuro

## 2021-01-21 NOTE — Progress Notes (Signed)
Pt from PACU, with ostomy bleeding, change ostomy bag. Pt alert and oriented, call button and phone in reached, bed in lowest position.

## 2021-01-21 NOTE — Op Note (Addendum)
[facility] Patient Name: Curtis [name] Procedure Date : 01/21/2021 MRN: [id number] Attending MD: Mauri Pole , MD Date of Birth: May 07, 1956 CSN: [id number] Age: 65 Admit Type: Inpatient Procedure:                Flexible Sigmoidoscopy Indications:              Abnormal CT of the GI tract, Exclusion of                            colorectal cancer, Lower abdominal pain Providers:                Mauri Pole, MD, Nelia Shi, RN,                            Laverda Sorenson, Technician Referring MD:              Medicines:                Propofol per Anesthesia Complications:            No immediate complications. Estimated Blood Loss:     Estimated blood loss was minimal. Procedure:                Pre-Anesthesia Assessment:                           - Prior to the procedure, a History and Physical                            was performed, and patient medications and                            allergies were reviewed. The patient's tolerance of                            previous anesthesia was also reviewed. The risks                            and benefits of the procedure and the sedation                            options and risks were discussed with the patient.                            All questions were answered, and informed consent                            was obtained. Prior Anticoagulants: The patient has                            taken no previous anticoagulant or antiplatelet                            agents. ASA Grade Assessment: II - A patient with  mild systemic disease. After reviewing the risks                            and benefits, the patient was deemed in                            satisfactory condition to undergo the procedure.                           After obtaining informed consent, the scope was                            passed under direct vision. The PCF-H190DL                             ([id number] Olympus pediatric colonscope was                            introduced through the anus and advanced to the the                            rectosigmoid junction. The flexible sigmoidoscopy                            was accomplished without difficulty. The patient                            tolerated the procedure well. The quality of the                            bowel preparation was unprepped. Scope In: 8:48:47 AM Scope Out: 8:57:27 AM Total Procedure Duration: 0 hours 8 minutes 40 seconds  Findings:      The perianal and digital rectal examinations were normal.      A frond-like/villous, infiltrative and ulcerated partially obstructing       mass was found in the recto-sigmoid colon, 12 cm from anal verge. The       mass was circumferential. No bleeding was present. This was biopsied       with a cold forceps for histology. Distal fold area was tattooed with an       injection of 4 mL of Spot (carbon black). Scope was not extended beyond       the mass lesion. 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 Biopsied. Tattooed. Recommendation:           - Return patient to hospital ward for ongoing care.                           - NPO                           - Follow up with surgery for  further management                           - Await pathology results. Procedure Code(s):        --- Professional ---                           [phone], 79, Sigmoidoscopy, flexible; with biopsy,                            single or multiple                           [id number], 52, Sigmoidoscopy, flexible; with directed                            submucosal injection(s), any substance Diagnosis Code(s):        --- Professional ---                           D49.0, Neoplasm of unspecified behavior of                            digestive system                           K56.690, Other partial intestinal obstruction                            R10.30, Lower abdominal pain, unspecified                           R93.3, Abnormal findings on diagnostic imaging of                            other parts of digestive tract CPT copyright 2019 American Medical Association. All rights reserved. The codes documented in this report are preliminary and upon coder review may  be revised to meet current compliance requirements. Mauri Pole, MD 01/21/2021 9:05:40 AM This report has been signed electronically. Number of Addenda: 0

## 2021-01-21 NOTE — Anesthesia Postprocedure Evaluation (Signed)
Anesthesia Post Note  Patient: Bowyn Parco  Procedure(s) Performed: [place] TATTOO INJECTION     Patient location during evaluation: PACU Anesthesia Type: MAC Level of consciousness: awake and alert Pain management: pain level controlled Vital Signs Assessment: post-procedure vital signs reviewed and stable Respiratory status: spontaneous breathing, nonlabored ventilation, respiratory function stable and patient connected to nasal cannula oxygen Cardiovascular status: stable and blood pressure returned to baseline Postop Assessment: no apparent nausea or vomiting Anesthetic complications: no   No notable events documented.  Last Vitals:  Vitals:   01/21/21 0930 01/21/21 0956  BP: (!) 161/79 (!) 153/75  Pulse: 62 62  Resp: (!) 23 20  Temp:  36.9 C  SpO2: 96% 99%    Last Pain:  Vitals:   01/21/21 1003  TempSrc:   PainSc: 0-No pain                 Belenda Cruise P Analiya Porco

## 2021-01-22 ENCOUNTER — Encounter (HOSPITAL_COMMUNITY): Payer: Self-pay | Admitting: Surgery

## 2021-01-22 LAB — CBC WITH DIFFERENTIAL/PLATELET
Abs Immature Granulocytes: 0.03 10*3/uL (ref 0.00–0.07)
Basophils Absolute: 0 10*3/uL (ref 0.0–0.1)
Basophils Relative: 0 %
Eosinophils Absolute: 0 10*3/uL (ref 0.0–0.5)
Eosinophils Relative: 0 %
HCT: 30.8 % — ABNORMAL LOW (ref 39.0–52.0)
Hemoglobin: 10.3 g/dL — ABNORMAL LOW (ref 13.0–17.0)
Immature Granulocytes: 0 %
Lymphocytes Relative: 8 %
Lymphs Abs: 1 10*3/uL (ref 0.7–4.0)
MCH: 27.8 pg (ref 26.0–34.0)
MCHC: 33.4 g/dL (ref 30.0–36.0)
MCV: 83.2 fL (ref 80.0–100.0)
Monocytes Absolute: 0.5 10*3/uL (ref 0.1–1.0)
Monocytes Relative: 4 %
Neutro Abs: 11.4 10*3/uL — ABNORMAL HIGH (ref 1.7–7.7)
Neutrophils Relative %: 88 %
Platelets: 354 10*3/uL (ref 150–400)
RBC: 3.7 MIL/uL — ABNORMAL LOW (ref 4.22–5.81)
RDW: 12.5 % (ref 11.5–15.5)
WBC: 13 10*3/uL — ABNORMAL HIGH (ref 4.0–10.5)
nRBC: 0 % (ref 0.0–0.2)

## 2021-01-22 LAB — SURGICAL PATHOLOGY

## 2021-01-22 LAB — BASIC METABOLIC PANEL
Anion gap: 10 (ref 5–15)
BUN: <5 mg/dL — ABNORMAL LOW (ref 8–23)
CO2: 28 mmol/L (ref 22–32)
Calcium: 8.5 mg/dL — ABNORMAL LOW (ref 8.9–10.3)
Chloride: 98 mmol/L (ref 98–111)
Creatinine, Ser: 0.86 mg/dL (ref 0.61–1.24)
GFR, Estimated: >60 mL/min (ref >60)
Glucose, Bld: 134 mg/dL — ABNORMAL HIGH (ref 70–99)
Potassium: 2.9 mmol/L — ABNORMAL LOW (ref 3.5–5.1)
Sodium: 136 mmol/L (ref 135–145)

## 2021-01-22 MED ORDER — POTASSIUM CHLORIDE 10 MEQ/100ML IV SOLN
10.0000 meq | INTRAVENOUS | Status: AC
  Administered 2021-01-22 (×4): 10 meq via INTRAVENOUS
  Filled 2021-01-22 (×5): qty 100

## 2021-01-22 MED ORDER — POTASSIUM CHLORIDE CRYS ER 20 MEQ PO TBCR
20.0000 meq | EXTENDED_RELEASE_TABLET | Freq: Two times a day (BID) | ORAL | Status: DC
  Administered 2021-01-22 – 2021-01-23 (×3): 20 meq via ORAL
  Filled 2021-01-22 (×3): qty 1

## 2021-01-22 NOTE — Consult Note (Signed)
[place] Nurse ostomy consult note Stoma type/location: LUQ colostomy, recessed with red rubber catheter retention rod in place.  Stomal assessment/size: oval 1 cm x 1.5 cm pale stoma with retention rod in place.  Producing soft brown stool. Leaked over night.  Peristomal assessment: deep creasing at 3 and 9 o'clock.   Treatment options for stomal/peristomal skin: Creasing and recession will require barrier ring to make a level plane for pouching and a flexible convex pouch with a belt.  Output soft brown stool Ostomy pouching: 1pc.convex with barrier ring and belt.  I demonstrate the barrier ring application.  1/2 of ring used to fill in defect at 3 and 9 o'clock and second half of ring used along bottom hemisphere of peristomal skin.  Hot pack applied to promote seal and ostomy belt fitted and applied.  Education provided: Pouch change performed.  Discussed twice weekly pouch changes and emptying when 1/3 full  Discussed rationale for barrier rings and procedure of cutting barrier to fit.  We apply pouch and discuss emptying.  I demonstrate how he can walk to toilet and empty independently and how to clean roll closure afterward.  He is appreciative for the teaching and is feeling more comfortable.  We discuss the patient assistance program and the flyer is left in the written materials I provided today. He is going to review this information.  I provide him with information about the outpatient ostomy clinic for after discharge ostomy needs. He has retired in the last year and has no insurance but due to income on last year's tax returns, he may not qualify for patient assistance. I have reached out to case manager to inquire about medicaid since he is unemployed and will have ongoing health challenges.  Enrolled patient in [place] program: Yes today.  Will follow.  [name] MSN, RN, FNP-BC CWON Wound, Ostomy, Continence Nurse Pager [phone]

## 2021-01-22 NOTE — TOC Progression Note (Signed)
Transition of Care [facility]) - Progression Note    Patient Details  Name: Theo Krumholz MRN: [id number] Date of Birth: 10/08/1955  Transition of Care [facility]) CM/SW Contact  Jacalyn Lefevre Edson Snowball, RN Phone Number: 01/22/2021, 10:20 AM  Clinical Narrative:     See previous note. Stacie with [facility] denied referral on Tuesday . Sent referral for [facility] again to [facility] with [facility]. [facility] has accepted referral for [facility].    [place] nurse provided patient with information regarding assistance for ostomy supplies ( not sure if he will qualify )   NCM has provided resources for patient also.   NCM asked nursing staff to provide some extra supplies at discharge.    Expected Discharge Plan: [place] Barriers to Discharge: Continued Medical Work up  Expected Discharge Plan and Services Expected Discharge Plan: [place] In-house Referral: Development worker, community Discharge Planning Services: CM Consult, [place] Program, Medication Assistance, [place] Acute Care Choice: [place] arrangements for the past 2 months: Single Family Home                                       Social Determinants of Health (SDOH) Interventions    Readmission Risk Interventions No flowsheet data found.

## 2021-01-22 NOTE — Progress Notes (Signed)
   Progress Note  1 Day Post-Op  Subjective: CC: he states he had a large volume of stool output into colostomy after surgery yesterday and had to change bag. Feeling well with pain well controlled. No nausea, emesis, abdominal pain on clears   Objective: Vital signs in last 24 hours: Temp:  [97.7 F (36.5 C)-98.8 F (37.1 C)] 97.8 F (36.6 C) (06/30 0555) Pulse Rate:  [60-76] 62 (06/30 0555) Resp:  [15-23] 18 (06/30 0555) BP: (92-170)/(50-83) 132/72 (06/30 0555) SpO2:  [93 %-99 %] 96 % (06/30 0555) Weight:  [88.8 kg] 88.8 kg (06/29 1233) Last BM Date: 01/21/21  Intake/Output from previous day: 06/29 0701 - 06/30 0700 In: 2517.3 [I.V.:2167.3; IV Piggyback:250] Out: 50 [Blood:50] Intake/Output this shift: No intake/output data recorded.  PE: General: pleasant, WD, male who is laying in bed in NAD HEENT: head is normocephalic, atraumatic.  Mouth is pink and moist Heart: regular, rate, and rhythm.   Lungs: Respiratory effort nonlabored Abd: soft, ND, +BS, mild TTP LLQ near colostomy. Colostomy with serosanguinous fluid, no air MS: all 4 extremities are symmetrical with no cyanosis, clubbing, or edema. Skin: warm and dry with no masses, lesions, or rashes Psych: A&Ox3 with an appropriate affect.    Lab Results:  Recent Labs    01/21/21 0312 01/22/21 0153  WBC 11.3* 13.0*  HGB 10.9* 10.3*  HCT 31.9* 30.8*  PLT 353 354   BMET Recent Labs    01/21/21 0312 01/22/21 0153  NA 136 136  K 2.9* 2.9*  CL 99 98  CO2 27 28  GLUCOSE 133* 134*  BUN <5* <5*  CREATININE 0.95 0.86  CALCIUM 8.7* 8.5*   PT/INR No results for input(s): LABPROT, INR in the last 72 hours. CMP     Component Value Date/Time   NA 136 01/22/2021 0153   K 2.9 (L) 01/22/2021 0153   CL 98 01/22/2021 0153   CO2 28 01/22/2021 0153   GLUCOSE 134 (H) 01/22/2021 0153   BUN <5 (L) 01/22/2021 0153   CREATININE 0.86 01/22/2021 0153   CALCIUM 8.5 (L) 01/22/2021 0153   PROT 6.1 (L) 01/21/2021 0312    ALBUMIN 2.4 (L) 01/21/2021 0312   AST 16 01/21/2021 0312   ALT 13 01/21/2021 0312   ALKPHOS 58 01/21/2021 0312   BILITOT 0.6 01/21/2021 0312   GFRNONAA >60 01/22/2021 0153   Lipase     Component Value Date/Time   LIPASE 20 01/16/2021 1515       Studies/Results: No results found.  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 - WBC 13.0, monitor - continued hypokalemia - IV repletion  - has been tolerating clear liquids and having bowel function   FEN: FLD ADAT soft, IVF, replete K IV ID: zosyn 6/24>> VTE: SCDs, lovenox   LOS: 6 days    Winferd Humphrey, [facility] 01/22/2021, 7:20 AM Please see Amion for pager number during day hours 7:00am-4:30pm

## 2021-01-23 ENCOUNTER — Other Ambulatory Visit (HOSPITAL_COMMUNITY): Payer: Self-pay

## 2021-01-23 LAB — CBC
HCT: 34.1 % — ABNORMAL LOW (ref 39.0–52.0)
Hemoglobin: 10.7 g/dL — ABNORMAL LOW (ref 13.0–17.0)
MCH: 27.1 pg (ref 26.0–34.0)
MCHC: 31.4 g/dL (ref 30.0–36.0)
MCV: 86.3 fL (ref 80.0–100.0)
Platelets: 424 10*3/uL — ABNORMAL HIGH (ref 150–400)
RBC: 3.95 MIL/uL — ABNORMAL LOW (ref 4.22–5.81)
RDW: 12.7 % (ref 11.5–15.5)
WBC: 12.3 10*3/uL — ABNORMAL HIGH (ref 4.0–10.5)
nRBC: 0 % (ref 0.0–0.2)

## 2021-01-23 LAB — BASIC METABOLIC PANEL
Anion gap: 9 (ref 5–15)
BUN: <5 mg/dL — ABNORMAL LOW (ref 8–23)
CO2: 24 mmol/L (ref 22–32)
Calcium: 8.2 mg/dL — ABNORMAL LOW (ref 8.9–10.3)
Chloride: 102 mmol/L (ref 98–111)
Creatinine, Ser: 0.95 mg/dL (ref 0.61–1.24)
GFR, Estimated: >60 mL/min (ref >60)
Glucose, Bld: 110 mg/dL — ABNORMAL HIGH (ref 70–99)
Potassium: 3.5 mmol/L (ref 3.5–5.1)
Sodium: 135 mmol/L (ref 135–145)

## 2021-01-23 MED ORDER — OXYCODONE HCL 5 MG PO TABS
5.0000 mg | ORAL_TABLET | ORAL | 0 refills | Status: AC | PRN
  Filled 2021-01-23: qty 24, 4d supply, fill #0

## 2021-01-23 MED ORDER — POTASSIUM CHLORIDE CRYS ER 20 MEQ PO TBCR
20.0000 meq | EXTENDED_RELEASE_TABLET | Freq: Two times a day (BID) | ORAL | 0 refills | Status: DC
  Filled 2021-01-23: qty 6, 3d supply, fill #0

## 2021-01-23 NOTE — Discharge Summary (Signed)
  [facility] [facility] Discharge Summary   Patient ID: Delray Reza MRN: [id number] DOB/AGE: 1956-02-23 65 y.o.  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  Procedures Dr. Georgette Dover (01/21/21) - Laparoscopic assisted loop colostomy  Hospital Course:  Leona Pressly is a 65 y.o. male with reportedly no significant PMH who presented for constipation, abdominal pain. This had been occurring for several weeks. Pain had been intermittent and generalized, vague and he had tried mag citrate and some other stool softeners without significant improvement. He had never had a colonoscopy. Workup showed labs notable for leukocytosis.  CT scan concerning for possible bowel perforation, perforated diverticulitis with abscess formation versus mass lesion with perforation.  Patient was admitted to the medicine service initially and we followed in consult. He was initially managed with IV antibiotics and bowel rest. Gastroenterology was consulted and flex sigmoidoscopy performed with biopsies to confirm tissue diagnosis of malignancy. Patient was then taken to the OR and underwent procedure listed above. Tolerated procedure well and was transferred to the floor.  We took over as attending. Diet was advanced as tolerated.  On POD2, the patient was voiding well, tolerating diet, ambulating well, pain well controlled, vital signs stable, incisions c/d/i and felt stable for discharge home. WOC was consulted during admission and he demonstrated ability to care for colostomy. Home health assistance was obtained with the help of case management. Patient will follow up in our office in 2-3 weeks and knows to call with  questions or concerns. Outpatient referral to Hematology/Oncology was placed and he will follow up with them.  I or a member of my team have reviewed this patient in the Controlled Substance Database.   Allergies as of 01/23/2021   No Known Allergies      Medication List     TAKE these medications    acetaminophen 500 MG tablet Commonly known as: TYLENOL Take 1,000 mg by mouth daily.   oxyCODONE 5 MG immediate release tablet Commonly known as: Oxy IR/ROXICODONE Take 1 tablet (5 mg total) by mouth every 4 (four) hours as needed for up to 5 days for moderate pain or severe pain.   potassium chloride SA 20 MEQ tablet Commonly known as: KLOR-CON Take 1 tablet (20 mEq total) by mouth 2 (two) times daily for 3 days.          Follow-up Information     [facility]. Schedule an appointment as soon as possible for a visit.   Contact information: [place] [id number] [place], [place] Follow up.   Specialty: Home Health Services Contact information: [place] [address] [phone]                 Signed: Caroll Rancher [facility] Surgery 01/23/2021, 3:32 PM Please see Amion for pager number during day hours 7:00am-4:30pm

## 2021-01-23 NOTE — Progress Notes (Signed)
   Progress Note  2 Days Post-Op  Subjective: CC: tolerated fulls well without nausea, emesis, or abdominal pain. Passing flatus and stool output yesterday - none today. He is ambulating. Pain controlled. Worked with [facility] nursing yesterday  Objective: Vital signs in last 24 hours: Temp:  [98.3 F (36.8 C)-98.9 F (37.2 C)] 98.6 F (37 C) (07/01 0439) Pulse Rate:  [60-78] 77 (07/01 0439) Resp:  [17] 17 (07/01 0439) BP: (141-174)/(71-85) 174/85 (07/01 0439) SpO2:  [92 %-98 %] 92 % (07/01 0439) Last BM Date: 01/21/21  Intake/Output from previous day: 06/30 0701 - 07/01 0700 In: 2724.7 [P.O.:840; I.V.:1732; IV Piggyback:152.8] Out: -  Intake/Output this shift: No intake/output data recorded.  PE: General: pleasant, WD, male who is laying in bed in NAD HEENT: head is normocephalic, atraumatic.  Mouth is pink and moist Heart: regular, rate, and rhythm.   Lungs: Respiratory effort nonlabored Abd: soft, ND, +BS, very mild TTP LLQ near colostomy. Colostomy with serosanguinous fluid MS: all 4 extremities are symmetrical with no cyanosis, clubbing, or edema. Skin: warm and dry with no masses, lesions, or rashes Psych: A&Ox3 with an appropriate affect.    Lab Results:  Recent Labs    01/21/21 0312 01/22/21 0153  WBC 11.3* 13.0*  HGB 10.9* 10.3*  HCT 31.9* 30.8*  PLT 353 354    BMET Recent Labs    01/22/21 0153 01/23/21 0159  NA 136 135  K 2.9* 3.5  CL 98 102  CO2 28 24  GLUCOSE 134* 110*  BUN <5* <5*  CREATININE 0.86 0.95  CALCIUM 8.5* 8.2*    PT/INR No results for input(s): LABPROT, INR in the last 72 hours. CMP     Component Value Date/Time   NA 135 01/23/2021 0159   K 3.5 01/23/2021 0159   CL 102 01/23/2021 0159   CO2 24 01/23/2021 0159   GLUCOSE 110 (H) 01/23/2021 0159   BUN <5 (L) 01/23/2021 0159   CREATININE 0.95 01/23/2021 0159   CALCIUM 8.2 (L) 01/23/2021 0159   PROT 6.1 (L) 01/21/2021 0312   ALBUMIN 2.4 (L) 01/21/2021 0312   AST 16 01/21/2021  0312   ALT 13 01/21/2021 0312   ALKPHOS 58 01/21/2021 0312   BILITOT 0.6 01/21/2021 0312   GFRNONAA >60 01/23/2021 0159   Lipase     Component Value Date/Time   LIPASE 20 01/16/2021 1515       Studies/Results: No results found.  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

## 2021-01-23 NOTE — Discharge Instructions (Signed)
To shower cover colostomy bag with water proof wrap (seran or cling wrap). Remove after showering and pat dry.

## 2021-01-23 NOTE — TOC Progression Note (Addendum)
Transition of Care [facility]) - Progression Note    Patient Details  Name: Curtis [name] MRN: [id number] Date of Birth: [birth year]  [facility] CM/SW Contact  Jacalyn Lefevre Edson Snowball, RN Phone Number: 01/23/2021, 7:59 AM  Clinical Narrative:     Possible discharge today .   Stacie with [facility] aware and if patient does discharge today he will be seen at home this weekend.   NCM has asked nurse to send extra ostomy supplies home with patient.   PA will send scripts to [facility] today. NCM will assist with cost of getting prescriptions filled. If patient does not discharge today, medications will be in main pharmacy and will need to be picked up by bedside nurse at discharge.   Patient will NEED to be taught ostomy / wound care prior to discharge. Home Health RN will not be making daily visits .  Patient aware of all of above. NCM again explained his HHRN will not be visiting every day , and he needs to change ostomy , empty ostomy etc himself before discharge. Patient voiced understanding. In progression same was discussed with nurse.   Expected Discharge Plan: [place] Barriers to Discharge: Continued Medical Work up  Expected Discharge Plan and Services Expected Discharge Plan: [place] In-house Referral: Development worker, community Discharge Planning Services: CM Consult, [place] Program, Medication Assistance, [employer] Acute Care Choice: [place] arrangements for the past 2 months: Single Family Home                                       Social Determinants of Health (SDOH) Interventions    Readmission Risk Interventions No flowsheet data found.

## 2021-01-29 ENCOUNTER — Telehealth: Payer: Self-pay | Admitting: Hematology and Oncology

## 2021-01-29 ENCOUNTER — Other Ambulatory Visit (HOSPITAL_COMMUNITY): Payer: Self-pay

## 2021-01-29 NOTE — Telephone Encounter (Signed)
Received a new referral from Dr. Georgette Dover for colon cancer. [name] returned my call and has been scheduled to see Dr. Lorenso Courier on 7/18 at 9am. Pt aware to arrive 20 minutes early.

## 2021-02-08 NOTE — Progress Notes (Signed)
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 CT abdomen/pelvis showed concern for mass lesion with perforation and surrounding adenopathy.  01/19/2021: repeat CT scan showed worsening inflammatory changes around the complex rectal process 01/21/2021: flexible sigmoidoscopy performed, lesion at 12 cm from the anal verge.  biopsy showed concern for adenocarcinoma. 01/21/2021: patient underwent a laparoscopic assisted loop colostomy with Dr. Georgette Dover.  02/09/2021: establish care with Dr. Lorenso Courier   CHIEF COMPLAINTS/PURPOSE OF CONSULTATION:  "Rectal Cancer "  HISTORY OF PRESENTING ILLNESS:  Curtis [name] 65 y.o. male with no significant medical history who presents for evaluation of newly diagnosed rectal adenocarcinoma.  On review of the previous records Curtis [name] presented to the emergency department on 01/16/2021 with abdominal pain.  CT abdomen pelvis showed concern for a mass lesion with perforation and surrounding adenopathy.  On 01/19/2021 this area was found to be worsening.  On 01/21/2021 the patient underwent a flexible sigmoidoscopy and the lesion was found at 12 cm from the anal verge.  This was at the rectosigmoid junction.  The biopsy showed concern for adenocarcinoma.  On 01/22/2019 the patient underwent a laparoscopic assisted loop colostomy with Dr. Georgette Dover.  He was subsequently discharged from the hospital with an ostomy bag.  He presents today to establish care with Dr. Lorenso Courier.  On exam today Curtis [name] reports that he is having difficulties with his ostomy.  He notes that there is been some leakage and he is currently using masking  tape in order to help hold it together.  He reports that he does have an appointment set up tomorrow with the ostomy nurse.  He otherwise has been quite well reporting having good energy, good appetite, and no current abdominal pain.  He notes that he is suffering from "boredom" he is not performing as many activities as he usually does.  He reports he has been "active all my life".  He does not have any remarkable past medical history.   On further discussion the patient notes that he is a current smoker and smokes about 3 to 4 cigarettes/day, however he used to smoke up to 2 packs/day.  He does not drink any alcohol.  His family history is remarkable for breast cancer in his sister.  He otherwise denies any fevers, chills, sweats, nausea, vomiting or diarrhea.  He has lost some weight and is currently 193 pounds down from 198.  Full 10 point ROS is listed below.  MEDICAL HISTORY:  History reviewed. No pertinent past medical history.  SURGICAL HISTORY: Past Surgical History:  Procedure Laterality Date   BIOPSY  01/21/2021   Procedure: BIOPSY;  Surgeon: Mauri Pole, MD;  Location: [facility];  Service: Endoscopy;;   COLON RESECTION N/A 01/21/2021   Procedure: LAPAROSCOPIC ASSISTED LOOP COLOSTOMY;  Surgeon: Donnie Mesa, MD;  Location: [facility];  Service: General;  Laterality: N/A;   FLEXIBLE SIGMOIDOSCOPY N/A 01/21/2021   Procedure: FLEXIBLE SIGMOIDOSCOPY;  Surgeon: Mauri Pole, MD;  Location: [facility];  Service: Endoscopy;  Laterality: N/A;   SUBMUCOSAL TATTOO INJECTION  01/21/2021   Procedure: SUBMUCOSAL TATTOO INJECTION;  Surgeon: Mauri Pole, MD;  Location: [facility];  Service: Endoscopy;;    SOCIAL HISTORY: Social History  Socioeconomic History   Marital status: Divorced    Spouse name: Not on file   Number of children: Not on file   Years of education: Not on file   Highest education level: Not on file  Occupational History   Not on file  Tobacco Use    Smoking status: Every Day    Types: Cigarettes   Smokeless tobacco: Never  Vaping Use   Vaping Use: Never used  Substance and Sexual Activity   Alcohol use: Never   Drug use: Never   Sexual activity: Never  Other Topics Concern   Not on file  Social History Narrative   Not on file   Social Determinants of Health   Financial Resource Strain: Not on file  Food Insecurity: Not on file  Transportation Needs: Not on file  Physical Activity: Not on file  Stress: Not on file  Social Connections: Not on file  Intimate Partner Violence: Not on file    FAMILY HISTORY: Family History  Problem Relation Age of Onset   Hypertension Mother    Hypertension Father    Breast cancer Sister     ALLERGIES:  has No Known Allergies.  MEDICATIONS:  Current Outpatient Medications  Medication Sig Dispense Refill   acetaminophen (TYLENOL) 500 MG tablet Take 1,000 mg by mouth daily.     No current facility-administered medications for this visit.    REVIEW OF SYSTEMS:   Constitutional: ( - ) fevers, ( - )  chills , ( - ) night sweats Eyes: ( - ) blurriness of vision, ( - ) double vision, ( - ) watery eyes Ears, nose, mouth, throat, and face: ( - ) mucositis, ( - ) sore throat Respiratory: ( - ) cough, ( - ) dyspnea, ( - ) wheezes Cardiovascular: ( - ) palpitation, ( - ) chest discomfort, ( - ) lower extremity swelling Gastrointestinal:  ( - ) nausea, ( - ) heartburn, ( - ) change in bowel habits Skin: ( - ) abnormal skin rashes Lymphatics: ( - ) new lymphadenopathy, ( - ) easy bruising Neurological: ( - ) numbness, ( - ) tingling, ( - ) new weaknesses Behavioral/Psych: ( - ) mood change, ( - ) new changes  All other systems were reviewed with the patient and are negative.  PHYSICAL EXAMINATION: ECOG PERFORMANCE STATUS: 1 - Symptomatic but completely ambulatory  Vitals:   02/09/21 0855  BP: (!) 149/113  Pulse: 83  Resp: 18  Temp: 97.7 F (36.5 C)  SpO2: 100%   Filed Weights    02/09/21 0855  Weight: 193 lb 3.2 oz (87.6 kg)    GENERAL: well appearing [name] in NAD  SKIN: skin color, texture, turgor are normal, no rashes or significant lesions EYES: conjunctiva are pink and non-injected, sclera clear LUNGS: clear to auscultation and percussion with normal breathing effort HEART: regular rate & rhythm and no murmurs and no lower extremity edema PSYCH: alert & oriented x 3, fluent speech NEURO: no focal motor/sensory deficits  LABORATORY DATA:  I have reviewed the data as listed CBC Latest Ref Rng & Units 02/09/2021 01/23/2021 01/22/2021  WBC 4.0 - 10.5 K/uL 8.0 12.3(H) 13.0(H)  Hemoglobin 13.0 - 17.0 g/dL 11.3(L) 10.7(L) 10.3(L)  Hematocrit 39.0 - 52.0 % 35.4(L) 34.1(L) 30.8(L)  Platelets 150 - 400 K/uL 374 424(H) 354    CMP Latest Ref Rng & Units 02/09/2021 01/23/2021 01/22/2021  Glucose 70 - 99 mg/dL 111(H) 110(H) 134(H)  BUN 8 - 23 mg/dL 15 <5(L) <  5(L)  Creatinine 0.61 - 1.24 mg/dL 0.80 0.95 0.86  Sodium 135 - 145 mmol/L 143 135 136  Potassium 3.5 - 5.1 mmol/L 4.1 3.5 2.9(L)  Chloride 98 - 111 mmol/L 105 102 98  CO2 22 - 32 mmol/L 28 24 28   Calcium 8.9 - 10.3 mg/dL 9.6 8.2(L) 8.5(L)  Total Protein 6.5 - 8.1 g/dL 7.7 - -  Total Bilirubin 0.3 - 1.2 mg/dL 0.3 - -  Alkaline Phos 38 - 126 U/L 72 - -  AST 15 - 41 U/L 12(L) - -  ALT 0 - 44 U/L 8 - -     PATHOLOGY: SURGICAL PATHOLOGY  CASE: [id number]  PATIENT: Curtis [name]  Surgical Pathology Report   Clinical History: rectosigmoid mass, R/O cancer (cm)   FINAL MICROSCOPIC DIAGNOSIS:   A. COLON, RECTOSIGMOID, BIOPSY:  -  Adenocarcinoma, at least intramucosal  -  See comment   COMMENT:   Dr. Vic Ripper reviewed the case and agrees with the above diagnosis.  Dr.  Silverio Decamp was notified of these results on January 22, 2021.   GROSS DESCRIPTION:   Received in formalin are pink-gray soft tissue fragments that are  submitted in toto. Number: Multiple.  Size: 0.7 x 0.5 x 0.2 cm in  aggregate.   Blocks: 1   SW 01/21/2021   Final Diagnosis performed by Thressa Sheller, MD.   Electronically signed  01/22/2021    RADIOGRAPHIC STUDIES: I have personally reviewed the radiological images as listed and agreed with the findings in the report: The inflammatory process at the rectosigmoid junction, consistent with biopsy-proven adenocarcinoma. CT ABDOMEN PELVIS W CONTRAST  Result Date: 01/19/2021 CLINICAL DATA:  Abdominal pain. Perforated diverticulitis versus mass on previous study. EXAM: CT ABDOMEN AND PELVIS WITH CONTRAST TECHNIQUE: Multidetector CT imaging of the abdomen and pelvis was performed using the standard protocol following bolus administration of intravenous contrast. CONTRAST:  134mL OMNIPAQUE IOHEXOL 300 MG/ML  SOLN COMPARISON:  01/16/2021 FINDINGS: Lower chest: No pleural or pericardial effusion. Visualized lung bases clear. Hepatobiliary: No focal liver abnormality is seen. No gallstones, gallbladder wall thickening, or biliary dilatation. Pancreas: Unremarkable. No pancreatic ductal dilatation or surrounding inflammatory changes. Spleen: Normal in size without focal abnormality. Adrenals/Urinary Tract: Normal adrenal glands. Bilateral nephrolithiasis, largest 3 mm calculus right mid pole. No hydronephrosis. Urinary bladder nondistended. Stomach/Bowel: Stomach is decompressed. Small bowel unremarkable. Normal appendix. The colon is nondilated. Multiple descending and sigmoid diverticula. Persistent complex peripherally-enhancing mass-like process centered at the rectosigmoid junction with loculated low-attenuation components suggesting necrosis or fluid. The cephalad extent of the process contains some bubbles of gas as before, likely extraluminal. Regional inflammatory/edematous changes have progressed since the previous exam. The process abuts the seminal vesicles anteriorly. Vascular/Lymphatic: Increased number of subcentimeter perirectal lymph nodes and some borderline enlarged regional  mesenteric nodes. Moderate calcified aortoiliac plaque without aneurysm or stenosis. Reproductive: Prostate is unremarkable. Other: No ascites.  No free air. Musculoskeletal: No acute or significant osseous findings. IMPRESSION: 1. Worsening inflammatory changes around the complex rectal process, worrisome for neoplasm with contained perforation. Recommend colonoscopic correlation. Regional metastatic or reactive adenopathy as before. 2.  Aortic Atherosclerosis (ICD10-170.0). 3. Bilateral nephrolithiasis without hydronephrosis. Electronically Signed   By: Lucrezia Europe M.D.   On: 01/19/2021 10:26   CT ABDOMEN PELVIS W CONTRAST  Result Date: 01/16/2021 CLINICAL DATA:  Abdominal pain EXAM: CT ABDOMEN AND PELVIS WITH CONTRAST TECHNIQUE: Multidetector CT imaging of the abdomen and pelvis was performed using the standard protocol following bolus administration of intravenous contrast. CONTRAST:  154mL OMNIPAQUE IOHEXOL 300 MG/ML  SOLN COMPARISON:  None. FINDINGS: Lower chest: Linear scarring atelectasis in the posterior right lung base. Lung bases otherwise clear. Normal heart size. No pericardial effusion. Hepatobiliary: Diffuse hepatic hypoattenuation compatible with hepatic steatosis. Smooth surface contour. No concerning focal liver lesion. Focal hypoattenuation along the falciform ligament likely focal fatty infiltration. Normal gallbladder and biliary tree without visible calcified gallstones or ductal dilatation. Pancreas: No pancreatic ductal dilatation or surrounding inflammatory changes. Spleen: Normal in size. No concerning splenic lesions. Adrenals/Urinary Tract: Normal adrenal glands. Kidneys are normally located with symmetric enhancement and excretion. No suspicious renal lesion,. Punctate nonobstructing calculi are seen bilaterally. Some mild asymmetric thickening of the posterior bladder wall is favored to be reactive to the inflammatory process in the pelvis detailed below. Stomach/Bowel: Distal  esophagus, stomach and duodenal sweep are unremarkable. No small bowel wall thickening or dilatation. No evidence of obstruction. A normal appendix is visualized. Proximal colon is unremarkable. There are distal colonic diverticula. There is an irregularly thickened and narrowed appearance of the rectosigmoid with surrounding multiloculated rim enhancing fluid collection extending towards and along the right pelvic sidewall with maximum conglomerate dimensions of 5.5 x 6.0 x 8.2 cm in transverse by AP by craniocaudal dimensions. Admixed air and fluid contained within this heterogeneous collection. Extensive surrounding phlegmon and inflammatory change. Fluid-filled appearance of the rectum. Vascular/Lymphatic: Atherosclerotic calcifications within the abdominal aorta and branch vessels. No aneurysm or ectasia. Few scattered prominent nodes are seen in the pelvis, possibly reactive. No other conspicuous or pathologically enlarged nodes in the abdomen or pelvis. Reproductive: Stranding and thickening from the rectosigmoid process above extends to the seminal vesicles. Few eccentric calcifications of the prostate. Other: Air and fluid containing collections extending along the rectosigmoid and right pelvic sidewall, as above. Surrounding fluid and phlegmon. No other free air or fluid is seen. No bowel containing hernia. Bilateral fat containing inguinal hernias. Musculoskeletal: Multilevel degenerative changes are present in the imaged portions of the spine. Additional degenerative changes in the hips and pelvis. No acute osseous abnormality or suspicious osseous lesion. IMPRESSION: 1. Markedly irregular segmental thickening narrowing of the rectosigmoid colon with extensive surrounding phlegmon as well as a large air and fluid containing collection extending towards and along the pelvic sidewall. While this could reflect a perforated diverticulitis with abscess formation appearance is highly worrisome for an  underlying mass lesion with perforation given the irregularity of the wall thickening. Surrounding adenopathy may be reactive or metastatic. 2. Suspect reactive thickening of the posterior bladder wall and seminal vesicles. Could correlate with urinalysis as warranted. 3. Hepatic steatosis 4.  Aortic Atherosclerosis (ICD10-I70.0). Electronically Signed   By: Lovena Le M.D.   On: 01/16/2021 19:06    ASSESSMENT & PLAN Curtis [name] 65 y.o. male with no significant medical history who presents for evaluation of newly diagnosed rectal adenocarcinoma.  After review of the labs, review of the records, and discussion with the patient the patients findings are most consistent with rectal adenocarcinoma.  At this time staging will require a CT scan of the chest as well as an MRI of the abdomen to better characterize this mass.  We will order baseline labs today including iron studies due to concern for iron deficiency anemia.  We will have a port placed and discussed the patient's care at multidisciplinary conference.  There is some haziness around the imaging and I have concern about the location of the lesion given that it is right at the rectosigmoid junction.  At  this time I would favor treating it like a rectal adenocarcinoma, however we will need to assure this is the consensus opinion from the [facility].  # Rectal Adenocarcinoma, Staging in Process -- We will complete staging with a CT scan of the chest as well as MRI abdomen --Labs today to include CBC, CMP, CEA.  Due to anemia we will order iron panel to assure patient is not iron deficient. --Plan for IR guided port placement --Patient has a visit with surgery next week. --Patient is scheduled to see ostomy nurse next week --Recommended this patient's case be discussed at multidisciplinary conference given its location and hazy imaging. --Given the location of this lesion at 12 cm from the anal verge I do believe this represents a rectal adenocarcinoma,  however we will discuss at the [facility] conference to assure that there is consensus on this --Assuming this is adenocarcinoma of the rectum would recommend proceeding with chemotherapy, followed by chemoradiation, followed by definitive surgery. --Return to clinic following the staging imaging and the port placement.  Orders Placed This Encounter  Procedures   CT Chest W Contrast    Standing Status:   Future    Standing Expiration Date:   02/09/2022    Order Specific Question:   If indicated for the ordered procedure, I authorize the administration of contrast media per Radiology protocol    Answer:   Yes    Order Specific Question:   Preferred imaging location?    Answer:   [facility]   MR Abdomen W Wo Contrast    Standing Status:   Future    Standing Expiration Date:   02/09/2022    Order Specific Question:   If indicated for the ordered procedure, I authorize the administration of contrast media per Radiology protocol    Answer:   Yes    Order Specific Question:   What is the patient's sedation requirement?    Answer:   No Sedation    Order Specific Question:   Does the patient have a pacemaker or implanted devices?    Answer:   No    Order Specific Question:   Preferred imaging location?    Answer:   [facility] (table limit - 550 lbs)   IR IMAGING GUIDED PORT INSERTION    Standing Status:   Future    Standing Expiration Date:   02/09/2022    Order Specific Question:   Reason for Exam (SYMPTOM  OR DIAGNOSIS REQUIRED)    Answer:   rectal cancer, chemotherapy, poor venous access    Order Specific Question:   Preferred Imaging Location?    Answer:   [facility]   CBC with Differential (Cancer Center Only)    Standing Status:   Future    Number of Occurrences:   1    Standing Expiration Date:   02/09/2022   CMP ([place] only)    Standing Status:   Future    Number of Occurrences:   1    Standing Expiration Date:   02/09/2022   Ferritin    Standing  Status:   Future    Number of Occurrences:   1    Standing Expiration Date:   02/09/2022   Retic Panel    Standing Status:   Future    Number of Occurrences:   1    Standing Expiration Date:   02/09/2022   Iron and TIBC    Standing Status:   Future    Number  of Occurrences:   1    Standing Expiration Date:   02/09/2022   Lactate dehydrogenase (LDH)    Standing Status:   Future    Number of Occurrences:   1    Standing Expiration Date:   02/09/2022    All questions were answered. The patient knows to call the clinic with any problems, questions or concerns.  A total of more than 60 minutes were spent on this encounter with face-to-face time and non-face-to-face time, including preparing to see the patient, ordering tests and/or medications, counseling the patient and coordination of care as outlined above.   Ledell Peoples, MD Department of Hematology/Oncology [place] [facility] Phone: [phone] Pager: [phone] Email: [email or web address]  02/09/2021 12:34 PM

## 2021-02-09 ENCOUNTER — Inpatient Hospital Stay: Payer: Self-pay | Attending: Hematology and Oncology

## 2021-02-09 ENCOUNTER — Encounter: Payer: Self-pay | Admitting: *Deleted

## 2021-02-09 ENCOUNTER — Encounter: Payer: Self-pay | Admitting: Hematology and Oncology

## 2021-02-09 ENCOUNTER — Other Ambulatory Visit: Payer: Self-pay

## 2021-02-09 ENCOUNTER — Inpatient Hospital Stay (HOSPITAL_BASED_OUTPATIENT_CLINIC_OR_DEPARTMENT_OTHER): Payer: Self-pay | Admitting: Hematology and Oncology

## 2021-02-09 VITALS — BP 149/113 | HR 83 | Temp 97.7°F | Resp 18 | Wt 193.2 lb

## 2021-02-09 DIAGNOSIS — F17210 Nicotine dependence, cigarettes, uncomplicated: Secondary | ICD-10-CM

## 2021-02-09 DIAGNOSIS — D649 Anemia, unspecified: Secondary | ICD-10-CM

## 2021-02-09 DIAGNOSIS — C20 Malignant neoplasm of rectum: Secondary | ICD-10-CM

## 2021-02-09 DIAGNOSIS — Z803 Family history of malignant neoplasm of breast: Secondary | ICD-10-CM

## 2021-02-09 LAB — CBC WITH DIFFERENTIAL (CANCER CENTER ONLY)
Abs Immature Granulocytes: 0.02 10*3/uL (ref 0.00–0.07)
Basophils Absolute: 0.1 10*3/uL (ref 0.0–0.1)
Basophils Relative: 1 %
Eosinophils Absolute: 0.2 10*3/uL (ref 0.0–0.5)
Eosinophils Relative: 3 %
HCT: 35.4 % — ABNORMAL LOW (ref 39.0–52.0)
Hemoglobin: 11.3 g/dL — ABNORMAL LOW (ref 13.0–17.0)
Immature Granulocytes: 0 %
Lymphocytes Relative: 18 %
Lymphs Abs: 1.4 10*3/uL (ref 0.7–4.0)
MCH: 27.2 pg (ref 26.0–34.0)
MCHC: 31.9 g/dL (ref 30.0–36.0)
MCV: 85.1 fL (ref 80.0–100.0)
Monocytes Absolute: 0.4 10*3/uL (ref 0.1–1.0)
Monocytes Relative: 5 %
Neutro Abs: 5.9 10*3/uL (ref 1.7–7.7)
Neutrophils Relative %: 74 %
Platelet Count: 374 10*3/uL (ref 150–400)
RBC: 4.16 MIL/uL — ABNORMAL LOW (ref 4.22–5.81)
RDW: 14.4 % (ref 11.5–15.5)
WBC Count: 8 10*3/uL (ref 4.0–10.5)
nRBC: 0 % (ref 0.0–0.2)

## 2021-02-09 LAB — RETIC PANEL
Immature Retic Fract: 15.9 % (ref 2.3–15.9)
RBC.: 4.12 MIL/uL — ABNORMAL LOW (ref 4.22–5.81)
Retic Count, Absolute: 83.2 10*3/uL (ref 19.0–186.0)
Retic Ct Pct: 2 % (ref 0.4–3.1)
Reticulocyte Hemoglobin: 30.8 pg (ref >27.9)

## 2021-02-09 LAB — CMP (CANCER CENTER ONLY)
ALT: 8 U/L (ref 0–44)
AST: 12 U/L — ABNORMAL LOW (ref 15–41)
Albumin: 3.2 g/dL — ABNORMAL LOW (ref 3.5–5.0)
Alkaline Phosphatase: 72 U/L (ref 38–126)
Anion gap: 10 (ref 5–15)
BUN: 15 mg/dL (ref 8–23)
CO2: 28 mmol/L (ref 22–32)
Calcium: 9.6 mg/dL (ref 8.9–10.3)
Chloride: 105 mmol/L (ref 98–111)
Creatinine: 0.8 mg/dL (ref 0.61–1.24)
GFR, Estimated: >60 mL/min (ref >60)
Glucose, Bld: 111 mg/dL — ABNORMAL HIGH (ref 70–99)
Potassium: 4.1 mmol/L (ref 3.5–5.1)
Sodium: 143 mmol/L (ref 135–145)
Total Bilirubin: 0.3 mg/dL (ref 0.3–1.2)
Total Protein: 7.7 g/dL (ref 6.5–8.1)

## 2021-02-09 LAB — IRON AND TIBC
Iron: 33 ug/dL — ABNORMAL LOW (ref 42–163)
Saturation Ratios: 11 % — ABNORMAL LOW (ref 20–55)
TIBC: 288 ug/dL (ref 202–409)
UIBC: 255 ug/dL (ref 117–376)

## 2021-02-09 LAB — LACTATE DEHYDROGENASE: LDH: 191 U/L (ref 98–192)

## 2021-02-09 LAB — FERRITIN: Ferritin: 117 ng/mL (ref 24–336)

## 2021-02-09 LAB — CEA (IN HOUSE-CHCC): CEA (CHCC-In House): 17.44 ng/mL — ABNORMAL HIGH (ref 0.00–5.00)

## 2021-02-09 NOTE — Progress Notes (Signed)
I met with Curtis [name] after his appt with Dr Dorsey today.  I provided my direct contact information and discussed my role as nurse navigator.  I reviewed need for port a cath placement.  I briefly discussed insertion.  I showed him our teaching port a cath.  I told him I will call him tomorrow after his CT and MRI have been schedule.  I told him IR will reach out to him for his port placement.  I also changed his ostomy appliance as the one he had in place was leaking.  All questions were answered.  He verbalized understanding of all of the above information. 

## 2021-02-09 NOTE — Progress Notes (Signed)
[facility]  Initial Assessment   Curtis [name] is a 65 y.o. year old male contacted by phone. Clinical Social Work was referred by medical oncology for assessment of psychosocial needs.   SDOH (Social Determinants of Health) assessments performed: Yes SDOH Interventions    Flowsheet Row Most Recent Value  SDOH Interventions   Food Insecurity Interventions Intervention Not Indicated  Financial Strain Interventions Other (Comment)  Housing Interventions Intervention Not Indicated  Intimate Partner Violence Interventions Intervention Not Indicated  Transportation Interventions Intervention Not Indicated       Distress Screen completed: No No flowsheet data found.    Family/Social Information:  Housing Arrangement: patient lives with adult son and daughter Family members/support persons in your life? Family Transportation concerns: no  Employment: Retired. Income source: Public affairs consultant and [place] concerns: Yes, current concerns Type of concern: Medical bills and insurance  Food access concerns: no Religious or spiritual practice:  Medication Concerns: no  Services Currently in place:  currently waiting on staging to determine available services and resources.   Coping/ Adjustment to diagnosis: Patient understands treatment plan and what happens next? yes Concerns about diagnosis and/or treatment: Overwhelmed by information and adjusting to ostomy. Patient reported stressors: Insurance, Adjusting to my illness, and Physical issues Hopes and priorities: identify treatment plan  Patient enjoys time with family/ friends Current coping skills/ strengths: Capable of independent living, Communication skills, and Supportive family/friends    SUMMARY: Current SDOH Barriers:  Financial constraints related to insurance   Clinical Social Work Clinical Goal(s):  patient will work with SW to address concerns related to financial resources .  CSW  will initiate appropriate referrals once staging and treatment plan is complete.    Interventions: Discussed common feeling and emotions when being diagnosed with cancer, and the importance of support during treatment Informed patient of the support team roles and support services at [facility] Provided [place] contact information and encouraged patient to call with any questions or concerns Provided patient with information about the [employer] and [employer].  Once staging is determined CSW will contact first sources/med assist if appropriate.    Follow Up Plan: Patient will contact CSW with any support or resource needs Patient verbalizes understanding of plan: Yes    [place] , LCSW   Curtis [name], MSW, LCSW, OSW-C Clinical Social Worker [facility] [phone]

## 2021-02-09 NOTE — Addendum Note (Signed)
Addended by: Ihor Gully A on: 02/09/2021 01:33 PM   Modules accepted: Orders

## 2021-02-10 ENCOUNTER — Ambulatory Visit (HOSPITAL_COMMUNITY)
Admission: RE | Admit: 2021-02-10 | Discharge: 2021-02-10 | Disposition: A | Discharge status: Home / Self Care | Payer: Self-pay | Source: Ambulatory Visit

## 2021-02-10 NOTE — Discharge Instructions (Signed)
You are doing great!  Call [employer] and make sure they are setting you up with a supply company.  They have programs for uninsured you can ask about as well. Your supplies are as follows:  2 3/4" cera plus barrier and pouch  Barrier ITEM # [id number]  POuch ITEM # [id number] Barrier ring ITEM # [phone] Ostomy belt. ITEM # [id number]  Call the office for ongoing help with pouching, supplies or education.

## 2021-02-16 ENCOUNTER — Other Ambulatory Visit: Payer: Self-pay

## 2021-02-16 ENCOUNTER — Ambulatory Visit (HOSPITAL_COMMUNITY)
Admission: RE | Admit: 2021-02-16 | Discharge: 2021-02-16 | Disposition: A | Discharge status: Home / Self Care | Payer: Self-pay | Source: Ambulatory Visit | Attending: Hematology and Oncology | Admitting: Hematology and Oncology

## 2021-02-16 DIAGNOSIS — C20 Malignant neoplasm of rectum: Secondary | ICD-10-CM | POA: Insufficient documentation

## 2021-02-16 MED ORDER — GADOBUTROL 1 MMOL/ML IV SOLN
9.0000 mL | Freq: Once | INTRAVENOUS | Status: AC | PRN
  Administered 2021-02-16: 9 mL via INTRAVENOUS

## 2021-02-19 ENCOUNTER — Ambulatory Visit (HOSPITAL_COMMUNITY)
Admission: RE | Admit: 2021-02-19 | Discharge: 2021-02-19 | Disposition: A | Discharge status: Home / Self Care | Payer: Self-pay | Source: Ambulatory Visit | Attending: Hematology and Oncology | Admitting: Hematology and Oncology

## 2021-02-19 ENCOUNTER — Encounter (HOSPITAL_COMMUNITY): Payer: Self-pay

## 2021-02-19 DIAGNOSIS — C20 Malignant neoplasm of rectum: Secondary | ICD-10-CM | POA: Insufficient documentation

## 2021-02-19 MED ORDER — IOHEXOL 350 MG/ML SOLN
60.0000 mL | Freq: Once | INTRAVENOUS | Status: AC | PRN
  Administered 2021-02-19: 60 mL via INTRAVENOUS

## 2021-02-19 NOTE — Progress Notes (Signed)
I spoke with Curtis [name] this afternoon.  I let him know I have scheduled his port placement for 8/10 at [facility].  I instructed him will need a driver and he can not eat or drink after 8 am that morning.  He verbalized understanding.

## 2021-02-23 ENCOUNTER — Other Ambulatory Visit: Payer: Self-pay

## 2021-02-23 ENCOUNTER — Inpatient Hospital Stay: Payer: Self-pay

## 2021-02-23 ENCOUNTER — Other Ambulatory Visit: Payer: Self-pay | Admitting: Hematology and Oncology

## 2021-02-23 ENCOUNTER — Encounter: Payer: Self-pay | Admitting: Hematology and Oncology

## 2021-02-23 ENCOUNTER — Inpatient Hospital Stay: Payer: Self-pay | Attending: Hematology and Oncology | Admitting: Hematology and Oncology

## 2021-02-23 VITALS — BP 159/78 | HR 62 | Temp 97.1°F | Resp 17 | Wt 195.5 lb

## 2021-02-23 DIAGNOSIS — C20 Malignant neoplasm of rectum: Secondary | ICD-10-CM

## 2021-02-23 DIAGNOSIS — D649 Anemia, unspecified: Secondary | ICD-10-CM | POA: Insufficient documentation

## 2021-02-23 DIAGNOSIS — R109 Unspecified abdominal pain: Secondary | ICD-10-CM | POA: Insufficient documentation

## 2021-02-23 DIAGNOSIS — Z803 Family history of malignant neoplasm of breast: Secondary | ICD-10-CM | POA: Insufficient documentation

## 2021-02-23 DIAGNOSIS — Z5111 Encounter for antineoplastic chemotherapy: Secondary | ICD-10-CM | POA: Insufficient documentation

## 2021-02-23 DIAGNOSIS — F17210 Nicotine dependence, cigarettes, uncomplicated: Secondary | ICD-10-CM | POA: Insufficient documentation

## 2021-02-23 DIAGNOSIS — Z79899 Other long term (current) drug therapy: Secondary | ICD-10-CM | POA: Insufficient documentation

## 2021-02-23 DIAGNOSIS — J439 Emphysema, unspecified: Secondary | ICD-10-CM | POA: Insufficient documentation

## 2021-02-23 DIAGNOSIS — Z8249 Family history of ischemic heart disease and other diseases of the circulatory system: Secondary | ICD-10-CM | POA: Insufficient documentation

## 2021-02-23 DIAGNOSIS — I700 Atherosclerosis of aorta: Secondary | ICD-10-CM | POA: Insufficient documentation

## 2021-02-23 LAB — CBC WITH DIFFERENTIAL (CANCER CENTER ONLY)
Abs Immature Granulocytes: 0.02 K/uL (ref 0.00–0.07)
Basophils Absolute: 0 K/uL (ref 0.0–0.1)
Basophils Relative: 1 %
Eosinophils Absolute: 0.4 K/uL (ref 0.0–0.5)
Eosinophils Relative: 6 %
HCT: 33.9 % — ABNORMAL LOW (ref 39.0–52.0)
Hemoglobin: 11.2 g/dL — ABNORMAL LOW (ref 13.0–17.0)
Immature Granulocytes: 0 %
Lymphocytes Relative: 24 %
Lymphs Abs: 1.6 K/uL (ref 0.7–4.0)
MCH: 27.8 pg (ref 26.0–34.0)
MCHC: 33 g/dL (ref 30.0–36.0)
MCV: 84.1 fL (ref 80.0–100.0)
Monocytes Absolute: 0.4 K/uL (ref 0.1–1.0)
Monocytes Relative: 6 %
Neutro Abs: 4.1 K/uL (ref 1.7–7.7)
Neutrophils Relative %: 63 %
Platelet Count: 271 K/uL (ref 150–400)
RBC: 4.03 MIL/uL — ABNORMAL LOW (ref 4.22–5.81)
RDW: 15.3 % (ref 11.5–15.5)
WBC Count: 6.6 K/uL (ref 4.0–10.5)
nRBC: 0 % (ref 0.0–0.2)

## 2021-02-23 LAB — CMP (CANCER CENTER ONLY)
ALT: 10 U/L (ref 0–44)
AST: 12 U/L — ABNORMAL LOW (ref 15–41)
Albumin: 3.4 g/dL — ABNORMAL LOW (ref 3.5–5.0)
Alkaline Phosphatase: 72 U/L (ref 38–126)
Anion gap: 7 (ref 5–15)
BUN: 12 mg/dL (ref 8–23)
CO2: 28 mmol/L (ref 22–32)
Calcium: 9.3 mg/dL (ref 8.9–10.3)
Chloride: 106 mmol/L (ref 98–111)
Creatinine: 0.87 mg/dL (ref 0.61–1.24)
GFR, Estimated: >60 mL/min
Glucose, Bld: 110 mg/dL — ABNORMAL HIGH (ref 70–99)
Potassium: 4.1 mmol/L (ref 3.5–5.1)
Sodium: 141 mmol/L (ref 135–145)
Total Bilirubin: 0.3 mg/dL (ref 0.3–1.2)
Total Protein: 7 g/dL (ref 6.5–8.1)

## 2021-02-23 NOTE — Progress Notes (Signed)
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 CT abdomen/pelvis showed concern for mass lesion with perforation and surrounding adenopathy. 01/19/2021: repeat CT scan showed worsening inflammatory changes around the complex rectal process 01/21/2021: flexible sigmoidoscopy performed, lesion at 12 cm from the anal verge.  biopsy showed concern for adenocarcinoma. 01/21/2021: patient underwent a laparoscopic assisted loop colostomy with Dr. Georgette Dover. 02/09/2021: establish care with Dr. Lorenso Courier   Interval History:  Curtis [name] 65 y.o. male with medical history significant for rectal adenocarcinoma who presents for a follow up visit. The patient's last visit was on 02/09/2021. In the interim since the last visit he has completed a CT scan of the chest and MRI abdomen which show no evidence of metastatic disease.   On exam today Curtis [name] reports he has been well in the interim since her last visit.  He continues to have some difficulties with his ostomy and is having some leakage at the ostomy site today.  He notes that he did see the surgeon who performed his colostomy who recommends that he is further treatment be picked up by Dr. Dema Severin.  The patient currently has a visit scheduled with Dr. Dema Severin on 03/18/2021.  He also has a port scheduled on 03/04/2021.  He reports that he is not having any abdominal pain and that he is eating well with no changes in his stool.  He denies any bright red blood or dark stools.  He otherwise denies any fevers, chills, sweats, nausea, vomiting or diarrhea.  A full 10 point ROS is listed below.  MEDICAL HISTORY:  No past  medical history on file.  SURGICAL HISTORY: Past Surgical History:  Procedure Laterality Date   BIOPSY  01/21/2021   Procedure: BIOPSY;  Surgeon: Mauri Pole, MD;  Location: [facility];  Service: Endoscopy;;   COLON RESECTION N/A 01/21/2021   Procedure: LAPAROSCOPIC ASSISTED LOOP COLOSTOMY;  Surgeon: Donnie [place], MD;  Location: [facility];  Service: General;  Laterality: N/A;   FLEXIBLE SIGMOIDOSCOPY N/A 01/21/2021   Procedure: FLEXIBLE SIGMOIDOSCOPY;  Surgeon: Mauri Pole, MD;  Location: [facility];  Service: Endoscopy;  Laterality: N/A;   SUBMUCOSAL TATTOO INJECTION  01/21/2021   Procedure: SUBMUCOSAL TATTOO INJECTION;  Surgeon: Mauri Pole, MD;  Location: [facility];  Service: Endoscopy;;    SOCIAL HISTORY: Social History   Socioeconomic History   Marital status: Divorced    Spouse name: Not on file   Number of children: Not on file   Years of education: Not on file   Highest education level: Not on file  Occupational History   Not on file  Tobacco Use   Smoking status: Every Day    Types: Cigarettes   Smokeless tobacco: Never  Vaping Use   Vaping Use: Never used  Substance and Sexual Activity   Alcohol use: Never   Drug use: Never   Sexual activity: Never  Other Topics Concern   Not on file  Social History Narrative   Not on file   Social Determinants of Health   Financial Resource Strain: Low Risk    Difficulty of Paying Living Expenses: Not very hard  Food Insecurity: No Food Insecurity   Worried About Running Out  of Food in the Last Year: Never true   Plum in the Last Year: Never true  Transportation Needs: No Transportation Needs   Lack of Transportation (Medical): No   Lack of Transportation (Non-Medical): No  Physical Activity: Not on file  Stress: Not on file  Social Connections: Not on file  Intimate Partner Violence: Not At Risk   Fear of Current or Ex-Partner: No   Emotionally Abused: No   Physically Abused: No    Sexually Abused: No    FAMILY HISTORY: Family History  Problem Relation Age of Onset   Hypertension Mother    Hypertension Father    Breast cancer Sister     ALLERGIES:  has No Known Allergies.  MEDICATIONS:  Current Outpatient Medications  Medication Sig Dispense Refill   acetaminophen (TYLENOL) 500 MG tablet Take 1,000 mg by mouth daily.     No current facility-administered medications for this visit.    REVIEW OF SYSTEMS:   Constitutional: ( - ) fevers, ( - )  chills , ( - ) night sweats Eyes: ( - ) blurriness of vision, ( - ) double vision, ( - ) watery eyes Ears, nose, mouth, throat, and face: ( - ) mucositis, ( - ) sore throat Respiratory: ( - ) cough, ( - ) dyspnea, ( - ) wheezes Cardiovascular: ( - ) palpitation, ( - ) chest discomfort, ( - ) lower extremity swelling Gastrointestinal:  ( - ) nausea, ( - ) heartburn, ( - ) change in bowel habits Skin: ( - ) abnormal skin rashes Lymphatics: ( - ) new lymphadenopathy, ( - ) easy bruising Neurological: ( - ) numbness, ( - ) tingling, ( - ) new weaknesses Behavioral/Psych: ( - ) mood change, ( - ) new changes  All other systems were reviewed with the patient and are negative.  PHYSICAL EXAMINATION: ECOG PERFORMANCE STATUS: 1 - Symptomatic but completely ambulatory  Vitals:   02/23/21 1136  BP: (!) 159/78  Pulse: 62  Resp: 17  Temp: (!) 97.1 F (36.2 C)  SpO2: 100%   Filed Weights   02/23/21 1136  Weight: 195 lb 8 oz (88.7 kg)    GENERAL: Well-appearing middle-aged [name], alert, no distress and comfortable SKIN: skin color, texture, turgor are normal, no rashes or significant lesions EYES: conjunctiva are pink and non-injected, sclera clear LUNGS: clear to auscultation and percussion with normal breathing effort HEART: regular rate & rhythm and no murmurs and no lower extremity edema ABDOMEN: soft, non-tender, non-distended, normal bowel sounds.  Ostomy with some stool leakage, use of masking tape  to hold in place. Musculoskeletal: no cyanosis of digits and no clubbing  PSYCH: alert & oriented x 3, fluent speech NEURO: no focal motor/sensory deficits  LABORATORY DATA:  I have reviewed the data as listed CBC Latest Ref Rng & Units 02/23/2021 02/09/2021 01/23/2021  WBC 4.0 - 10.5 K/uL 6.6 8.0 12.3(H)  Hemoglobin 13.0 - 17.0 g/dL 11.2(L) 11.3(L) 10.7(L)  Hematocrit 39.0 - 52.0 % 33.9(L) 35.4(L) 34.1(L)  Platelets 150 - 400 K/uL 271 374 424(H)    CMP Latest Ref Rng & Units 02/23/2021 02/09/2021 01/23/2021  Glucose 70 - 99 mg/dL 110(H) 111(H) 110(H)  BUN 8 - 23 mg/dL 12 15 <5(L)  Creatinine 0.61 - 1.24 mg/dL 0.87 0.80 0.95  Sodium 135 - 145 mmol/L 141 143 135  Potassium 3.5 - 5.1 mmol/L 4.1 4.1 3.5  Chloride 98 - 111 mmol/L 106 105 102  CO2 22 - 32 mmol/L  $'28 28 24  'U$ Calcium 8.9 - 10.3 mg/dL 9.3 9.6 8.2(L)  Total Protein 6.5 - 8.1 g/dL 7.0 7.7 -  Total Bilirubin 0.3 - 1.2 mg/dL 0.3 0.3 -  Alkaline Phos 38 - 126 U/L 72 72 -  AST 15 - 41 U/L 12(L) 12(L) -  ALT 0 - 44 U/L 10 8 -    RADIOGRAPHIC STUDIES: I have personally reviewed the radiological images as listed and agreed with the findings in the report: no evidence of metastatic diease in the chest. MRI abdomen does not show metastatic disease.  CT Chest W Contrast  Result Date: 02/20/2021 CLINICAL DATA:  New diagnosis of colorectal cancer. Staging chest CT. EXAM: CT CHEST WITH CONTRAST TECHNIQUE: Multidetector CT imaging of the chest was performed during intravenous contrast administration. CONTRAST:  110m OMNIPAQUE IOHEXOL 350 MG/ML SOLN COMPARISON:  None. FINDINGS: Cardiovascular: The heart is normal in size. No pericardial effusion. There is mild tortuosity, ectasia and age advanced calcification of the thoracic aorta. No focal aneurysm or dissection. The branch vessels are patent. Scattered 2 vessel coronary artery calcifications. Mediastinum/Nodes: Small scattered mediastinal and hilar lymph nodes but no mass or overt adenopathy. The  esophagus is unremarkable. Lungs/Pleura: Mild emphysematous changes but no acute pulmonary findings. No infiltrates, edema or effusions. No worrisome pulmonary lesions or pulmonary nodules to suggest pulmonary metastatic disease. No pleural effusions or pleural nodules. No bronchiectasis or interstitial lung disease. Upper Abdomen: No significant upper abdominal findings. Musculoskeletal: No chest wall mass, supraclavicular or axillary adenopathy. Thyroid gland is unremarkable. The bony thorax is intact. No worrisome bone lesions. IMPRESSION: 1. No CT findings for metastatic disease involving the chest. 2. Age advanced atherosclerotic calcification involving the thoracic aorta and coronary arteries. 3. Mild emphysematous changes but no acute pulmonary findings or worrisome pulmonary lesions. 4. Emphysema and aortic atherosclerosis. Aortic Atherosclerosis (ICD10-I70.0) and Emphysema (ICD10-J43.9). Electronically Signed   By: PMarijo SanesM.D.   On: 02/20/2021 14:52   MR Abdomen W Wo Contrast  Result Date: 02/17/2021 CLINICAL DATA:  Rectal cancer staging EXAM: MRI ABDOMEN WITHOUT AND WITH CONTRAST TECHNIQUE: Multiplanar multisequence MR imaging of the abdomen was performed both before and after the administration of intravenous contrast. CONTRAST:  983mGADAVIST GADOBUTROL 1 MMOL/ML IV SOLN COMPARISON:  CT abdomen pelvis, 01/19/2021 FINDINGS: Lower chest: No acute findings. Hepatobiliary: There is a small, flash filling hemangioma of the peripheral liver dome measuring 1.3 cm (series 16, image 14) as well as of the caudate lobe measuring 0.9 cm (series 16, image 27). No solid mass or other parenchymal abnormality identified. No biliary ductal dilatation. Pancreas: No mass, inflammatory changes, or other parenchymal abnormality identified. Spleen:  Within normal limits in size and appearance. Adrenals/Urinary Tract: No masses identified. Multiple bilateral renal calculi. No evidence of hydronephrosis.  Stomach/Bowel: Partially imaged left lower quadrant end colostomy. Visualized portions within the abdomen are otherwise unremarkable. Vascular/Lymphatic: No pathologically enlarged lymph nodes identified. No abdominal aortic aneurysm demonstrated. Aortic atherosclerosis. Other:  None. Musculoskeletal: No suspicious bone lesions identified. IMPRESSION: 1. No evidence of metastatic disease within the abdomen. 2. Small benign flash filling hepatic hemangiomata. 3. Partially imaged left lower quadrant end colostomy. 4. Bilateral nephrolithiasis.  No hydronephrosis Aortic Atherosclerosis (ICD10-I70.0). Electronically Signed   By: AlEddie Candle.D.   On: 02/17/2021 15:19    ASSESSMENT & PLAN Curtis [name] [age].o. male with medical history significant for rectal adenocarcinoma who presents for a follow up visit.   At this time the patient's findings are most consistent with  localized rectal adenocarcinoma.  CT scan imaging is hazy due to the concern for perforation.  There is also lymphadenopathy in the pelvis, however it is unclear if this represents metastatic disease or inflammatory response to the perforated colon.  At this time we will discuss case and DC to see what the next best steps will be.  Tentatively I have ordered a MRI of the pelvis in order to better examine the lymph nodes and the mass itself.  If this imaging is still hazy could consider endorectal ultrasound.  Assuming this is a higher stage rectal cancer would recommend consideration of chemotherapy, followed by chemoradiation, followed by surgical resection.  We currently have the patient scheduled for a port placement on 03/04/2021.  We will plan to start therapy shortly after that time.  # Rectal Adenocarcinoma, Staging in Process --  staging with a CT scan of the chest and MRI abdomen findings are consistent with localized disease.  There appears to be lymph node involvement, however it is unclear if these are reactive or cancer  spread. --Labs today to include CBC, CMP --CEA modestly elevated at 17.44 on 02/09/2021.  --Recommended this patient's case be discussed at multidisciplinary conference given its location and hazy imaging. --Given the location of this lesion at 12 cm from the anal verge I do believe this represents a rectal adenocarcinoma, however we will discuss at the [facility] conference to assure that there is consensus on this --Assuming this is adenocarcinoma of the rectum would recommend proceeding with neoadjuvant chemotherapy, followed by chemoradiation, followed by definitive surgery. --plan for RTC on 03/05/2021 after port placement to start chemotherapy.   #Supportive Care -- chemotherapy education to be scheduled  -- port placement scheduled for 03/04/21 -- zofran '8mg'$  q8H PRN and compazine '10mg'$  PO q6H for nausea -- EMLA cream for port -- no pain medication required at this time.   Orders Placed This Encounter  Procedures   MR Pelvis W Wo Contrast    Standing Status:   Future    Standing Expiration Date:   02/23/2022    Order Specific Question:   If indicated for the ordered procedure, I authorize the administration of contrast media per Radiology protocol    Answer:   Yes    Order Specific Question:   What is the patient's sedation requirement?    Answer:   No Sedation    Order Specific Question:   Does the patient have a pacemaker or implanted devices?    Answer:   No    Order Specific Question:   Preferred imaging location?    Answer:   [facility] (table limit - 550 lbs)   Ambulatory referral to Radiation Oncology    Referral Priority:   Routine    Referral Type:   Consultation    Referral Reason:   Specialty Services Required    Requested Specialty:   Radiation Oncology    Number of Visits Requested:   1   All questions were answered. The patient knows to call the clinic with any problems, questions or concerns.  A total of more than 30 minutes were spent on this encounter with  face-to-face time and non-face-to-face time, including preparing to see the patient, ordering tests and/or medications, counseling the patient and coordination of care as outlined above.   Ledell Peoples, MD Department of Hematology/Oncology [place] [facility] Phone: [phone] Pager: [phone] Email: [email or web address]  02/23/2021 12:53 PM

## 2021-02-25 ENCOUNTER — Other Ambulatory Visit: Payer: Self-pay | Admitting: *Deleted

## 2021-02-25 NOTE — Progress Notes (Signed)
The proposed treatment discussed in conference is for discussion purpose only and is not a binding recommendation.  The patients have not been physically examined, or presented with their treatment options.  Therefore, final treatment plans cannot be decided.  

## 2021-02-25 NOTE — Progress Notes (Signed)
GI Location of Tumor / Histology: Rectosigmoid Adenocarcinoma  Curtis [name] presented in June with complaints of abdominal pain.  MRI Pelvis 03/01/2021:   Sigmoidoscopy 01/21/2021: Lesion at 12 cm from the anal verge.  Biopsy showed concern for adenocarcinoma.  CT Chest 02/19/2021: No CT findings for metastatic disease involving the chest.  MRI Abdomen 02/16/2021: No evidence of metastatic disease within the abdomen.  Small benign flash filling hepatic hemangiomata.  Partially imaged left lower quadrant end colostomy.  CT AP 01/19/2021: Worsening inflammatory changes around the complex rectal process, worrisome for neoplasm with contained perforation.  CT AP 01/16/2021: Markedly irregular segmental thickening narrowing of the rectosigmoid colon with extensive surrounding phlegmon as well as a large air and fluid containing collection extending towards and along the pelvic sidewall. While this could reflect a perforated diverticulitis with abscess formation appearance is highly worrisome for an underlying mass lesion with perforation given the irregularity of the wall thickening. Surrounding adenopathy may be reactive or metastatic.  Biopsies of Rectosigmoid mass 01/21/2021   Past/Anticipated interventions by surgeon, if any:  Dr. Dema Severin 03/18/2021   Dr. Georgette Dover -Laparoscopic assisted loop colostomy 01/21/2021  Past/Anticipated interventions by medical oncology, if any:  Dr. Lorenso Courier 02/23/2021 --  staging with a CT scan of the chest and MRI abdomen findings are consistent with localized disease.  There appears to be lymph node involvement, however it is unclear if these are reactive or cancer spread. --Labs today to include CBC, CMP --CEA modestly elevated at 17.44 on 02/09/2021.  --Recommended this patient's case be discussed at multidisciplinary conference given its location and hazy imaging. --Given the location of this lesion at 12 cm from the anal verge I do believe this represents a rectal  adenocarcinoma, however we will discuss at the [facility] conference to assure that there is consensus on this --Assuming this is adenocarcinoma of the rectum would recommend proceeding with neoadjuvant chemotherapy, followed by chemoradiation, followed by definitive surgery. --plan for RTC on 03/05/2021 after port placement to start chemotherapy.    Weight changes, if any: Has gradually lost some weight over the last 6 months.  Bowel/Bladder complaints, if any: Has colostomy.  Has some urinary urgency and frequency, roughly every hour at night.  Nausea / Vomiting, if any: No  Pain issues, if any:  No  Any blood per rectum: Notes a couple smears of blood, pink tinged. Has seen small amounts of mucous a couple of times.  Appetite:  Good   SAFETY ISSUES: Prior radiation? No Pacemaker/ICD? No Possible current pregnancy? N/a Is the patient on methotrexate? No  Current Complaints/Details: -Port insertion 03/04/2021

## 2021-02-25 NOTE — Progress Notes (Signed)
I spoke with Curtis [name] and let him know I have scheduled an MRI pelvis for him on Sunday 8/7 arrive at 1130. He is to register at the [facility] ED registration desk for the Sunday appt.  He verbalized understanding

## 2021-02-26 ENCOUNTER — Ambulatory Visit
Admission: RE | Admit: 2021-02-26 | Discharge: 2021-02-26 | Disposition: A | Discharge status: Home / Self Care | Payer: Self-pay | Source: Ambulatory Visit | Attending: Radiation Oncology | Admitting: Radiation Oncology

## 2021-02-26 ENCOUNTER — Other Ambulatory Visit: Payer: Self-pay

## 2021-02-26 ENCOUNTER — Other Ambulatory Visit: Payer: Self-pay | Admitting: Hematology and Oncology

## 2021-02-26 ENCOUNTER — Encounter: Payer: Self-pay | Admitting: Radiation Oncology

## 2021-02-26 VITALS — Ht 70.0 in | Wt 195.0 lb

## 2021-02-26 DIAGNOSIS — C20 Malignant neoplasm of rectum: Secondary | ICD-10-CM

## 2021-02-26 HISTORY — DX: Malignant neoplasm of rectosigmoid junction: C19

## 2021-02-26 MED ORDER — ONDANSETRON HCL 8 MG PO TABS: 8.0000 mg | ORAL_TABLET | Freq: Three times a day (TID) | ORAL | 0 refills | Status: DC | PRN

## 2021-02-26 MED ORDER — PROCHLORPERAZINE MALEATE 10 MG PO TABS: 10.0000 mg | ORAL_TABLET | Freq: Four times a day (QID) | ORAL | 0 refills | Status: DC | PRN

## 2021-02-26 MED ORDER — LIDOCAINE-PRILOCAINE 2.5-2.5 % EX CREA: 1.0000 "application " | TOPICAL_CREAM | CUTANEOUS | 0 refills | Status: DC | PRN

## 2021-02-26 NOTE — Progress Notes (Signed)
START ON PATHWAY REGIMEN - Colorectal     A cycle is every 14 days:     Oxaliplatin      Leucovorin      Fluorouracil      Fluorouracil   **Always confirm dose/schedule in your pharmacy ordering system**  Patient Characteristics: Preoperative or Nonsurgical Candidate (Clinical Staging), Rectal, cT3 - cT4, cN0 or Any cT, cN+ Tumor Location: Rectal Therapeutic Status: Preoperative or Nonsurgical Candidate (Clinical Staging) AJCC T Category: cTX AJCC N Category: cN2 AJCC M Category: cM0 AJCC 8 Stage Grouping: Unknown Intent of Therapy: Curative Intent, Discussed with Patient

## 2021-02-27 NOTE — Progress Notes (Signed)
Radiation Oncology         [phone] ________________________________  Name: Curtis [name] MRN: [id number]  Date: 02/26/2021  DOB: 1956/06/15  CC:Pcp, No  Orson Slick, MD     REFERRING PHYSICIAN: Orson Slick, MD   DIAGNOSIS: The encounter diagnosis was Rectal cancer [facility]).   HISTORY OF PRESENT ILLNESS::Curtis [name] is a 65 y.o. male who is seen for an initial consultation visit regarding the patient's diagnosis of rectal cancer.  The patient initially was diagnosed in the setting of a bowel perforation.  CT imaging at that time suggested the possibility of a locally advanced proximal rectal tumor.  Biopsy did confirm adenocarcinoma.  CT imaging demonstrated some lymphadenopathy but it is not possible to tell definitively whether this represents tumor spread versus reactive from the perforation/infection.  MRI scan of the pelvis is pending.  Because of the patient's initial presentation he is status post a colostomy.  His case was reviewed in multidisciplinary GI conference and it was felt that try modality therapy would be reasonable.  The patient is scheduled to begin initial chemotherapy as part of total neoadjuvant treatment for this malignancy and I been asked to see the patient for consideration of radiation treatment.    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     PAST SURGICAL HISTORY: Past Surgical History:  Procedure Laterality Date   BIOPSY  01/21/2021   Procedure: BIOPSY;  Surgeon: Mauri Pole, MD;  Location: [place];  Service: Endoscopy;;   COLON RESECTION N/A 01/21/2021   Procedure: LAPAROSCOPIC ASSISTED LOOP COLOSTOMY;  Surgeon: Donnie Mesa, MD;  Location: [place];  Service: General;  Laterality: N/A;   FLEXIBLE SIGMOIDOSCOPY N/A 01/21/2021   Procedure: FLEXIBLE SIGMOIDOSCOPY;  Surgeon: Mauri Pole, MD;  Location: [place] [facility];  Service: Endoscopy;  Laterality: N/A;    SUBMUCOSAL TATTOO INJECTION  01/21/2021   Procedure: SUBMUCOSAL TATTOO INJECTION;  Surgeon: Mauri Pole, MD;  Location: [facility];  Service: Endoscopy;;     FAMILY HISTORY: family history includes Breast cancer in his sister; Hypertension in his father and mother.   SOCIAL HISTORY:  reports that he has been smoking cigarettes. He has never used smokeless tobacco. He reports that he does not drink alcohol and does not use drugs.   ALLERGIES: Patient has no known allergies.   MEDICATIONS:  Current Outpatient Medications  Medication Sig Dispense Refill   acetaminophen (TYLENOL) 500 MG tablet Take 1,000 mg by mouth daily.     lidocaine-prilocaine (EMLA) cream Apply 1 application topically as needed. (Patient not taking: Reported on 02/26/2021) 30 g 0   ondansetron (ZOFRAN) 8 MG tablet Take 1 tablet (8 mg total) by mouth every 8 (eight) hours as needed. (Patient not taking: Reported on 02/26/2021) 30 tablet 0   prochlorperazine (COMPAZINE) 10 MG tablet Take 1 tablet (10 mg total) by mouth every 6 (six) hours as needed for nausea or vomiting. (Patient not taking: Reported on 02/26/2021) 30 tablet 0   No current facility-administered medications for this encounter.     REVIEW OF SYSTEMS:  A 15 point review of systems is documented in the electronic medical record. This was obtained by the nursing staff. However, I reviewed this with the patient to discuss relevant findings and make appropriate changes.  Pertinent items are noted in HPI.    PHYSICAL EXAM:  height is '5\' 10"'$  (1.778 m) and weight is 195 lb (88.5 kg).  ECOG = 1  0 - Asymptomatic (Fully active, able to carry on all predisease activities without restriction)  1 - Symptomatic but completely ambulatory (Restricted in physically strenuous activity but ambulatory and able to carry out work of a light or sedentary nature. For example, light housework, office work)  2 - Symptomatic, <50% in bed during the day (Ambulatory and  capable of all self care but unable to carry out any work activities. Up and about more than 50% of waking hours)  3 - Symptomatic, >50% in bed, but not bedbound (Capable of only limited self-care, confined to bed or chair 50% or more of waking hours)  4 - Bedbound (Completely disabled. Cannot carry on any self-care. Totally confined to bed or chair)  5 - Death   Eustace Pen MM, Creech RH, Tormey DC, et al. [phone]). "Toxicity and response criteria of the [facility]". [place] Oncol. 5 (6): 649-55  Alert, no distress   LABORATORY DATA:  Lab Results  Component Value Date   WBC 6.6 02/23/2021   HGB 11.2 (L) 02/23/2021   HCT 33.9 (L) 02/23/2021   MCV 84.1 02/23/2021   PLT 271 02/23/2021   Lab Results  Component Value Date   NA 141 02/23/2021   K 4.1 02/23/2021   CL 106 02/23/2021   CO2 28 02/23/2021   Lab Results  Component Value Date   ALT 10 02/23/2021   AST 12 (L) 02/23/2021   ALKPHOS 72 02/23/2021   BILITOT 0.3 02/23/2021      RADIOGRAPHY: CT Chest W Contrast  Result Date: 02/20/2021 CLINICAL DATA:  New diagnosis of colorectal cancer. Staging chest CT. EXAM: CT CHEST WITH CONTRAST TECHNIQUE: Multidetector CT imaging of the chest was performed during intravenous contrast administration. CONTRAST:  70m OMNIPAQUE IOHEXOL 350 MG/ML SOLN COMPARISON:  None. FINDINGS: Cardiovascular: The heart is normal in size. No pericardial effusion. There is mild tortuosity, ectasia and age advanced calcification of the thoracic aorta. No focal aneurysm or dissection. The branch vessels are patent. Scattered 2 vessel coronary artery calcifications. Mediastinum/Nodes: Small scattered mediastinal and hilar lymph nodes but no mass or overt adenopathy. The esophagus is unremarkable. Lungs/Pleura: Mild emphysematous changes but no acute pulmonary findings. No infiltrates, edema or effusions. No worrisome pulmonary lesions or pulmonary nodules to suggest pulmonary metastatic  disease. No pleural effusions or pleural nodules. No bronchiectasis or interstitial lung disease. Upper Abdomen: No significant upper abdominal findings. Musculoskeletal: No chest wall mass, supraclavicular or axillary adenopathy. Thyroid gland is unremarkable. The bony thorax is intact. No worrisome bone lesions. IMPRESSION: 1. No CT findings for metastatic disease involving the chest. 2. Age advanced atherosclerotic calcification involving the thoracic aorta and coronary arteries. 3. Mild emphysematous changes but no acute pulmonary findings or worrisome pulmonary lesions. 4. Emphysema and aortic atherosclerosis. Aortic Atherosclerosis (ICD10-I70.0) and Emphysema (ICD10-J43.9). Electronically Signed   By: PMarijo SanesM.D.   On: 02/20/2021 14:52   MR Abdomen W Wo Contrast  Result Date: 02/17/2021 CLINICAL DATA:  Rectal cancer staging EXAM: MRI ABDOMEN WITHOUT AND WITH CONTRAST TECHNIQUE: Multiplanar multisequence MR imaging of the abdomen was performed both before and after the administration of intravenous contrast. CONTRAST:  946mGADAVIST GADOBUTROL 1 MMOL/ML IV SOLN COMPARISON:  CT abdomen pelvis, 01/19/2021 FINDINGS: Lower chest: No acute findings. Hepatobiliary: There is a small, flash filling hemangioma of the peripheral liver dome measuring 1.3 cm (series 16, image 14) as well as of the caudate lobe measuring 0.9 cm (series 16, image 27). No solid  mass or other parenchymal abnormality identified. No biliary ductal dilatation. Pancreas: No mass, inflammatory changes, or other parenchymal abnormality identified. Spleen:  Within normal limits in size and appearance. Adrenals/Urinary Tract: No masses identified. Multiple bilateral renal calculi. No evidence of hydronephrosis. Stomach/Bowel: Partially imaged left lower quadrant end colostomy. Visualized portions within the abdomen are otherwise unremarkable. Vascular/Lymphatic: No pathologically enlarged lymph nodes identified. No abdominal aortic aneurysm  demonstrated. Aortic atherosclerosis. Other:  None. Musculoskeletal: No suspicious bone lesions identified. IMPRESSION: 1. No evidence of metastatic disease within the abdomen. 2. Small benign flash filling hepatic hemangiomata. 3. Partially imaged left lower quadrant end colostomy. 4. Bilateral nephrolithiasis.  No hydronephrosis Aortic Atherosclerosis (ICD10-I70.0). Electronically Signed   By: Eddie Candle M.D.   On: 02/17/2021 15:19       IMPRESSION/ PLAN:  The patient has a new diagnosis of a proximal rectal tumor, felt to be locally advanced as noted above based on his presentation and imaging thus far.  Total neoadjuvant treatment appears very reasonable and this was discussed as a good option for the patient in multidisciplinary GI conference.  Preoperative chemotherapy and radiation treatment would allow further improvement and healing in this area prior to proceeding with definitive resection.  The patient is beginning chemotherapy in the near future.  I therefore discussed with him a anticipated course of chemoradiation treatment for 5-1/2 weeks after this initial phase of chemotherapy alone.  We discussed the rationale of treatment as well as possible side effects and risks.  All of his questions were answered and he indicated that he wished to proceed with this treatment at the appropriate time.  I look forward to seeing the patient towards the end of chemotherapy to further coordinate his care.  Given current concerns for patient exposure during the COVID-19 pandemic, this encounter was conducted via telephone.  The patient has given verbal consent for this type of encounter. The time spent during this encounter was 45 minutes and more than 50% of that time was spent in the coordination of the patient's care. The attendants for this meeting included Dr. Lisbeth Renshaw, and the patient.  During the encounter Dr. Lisbeth Renshaw was located at [facility] Radiation Oncology Department.  The patient   was located at home.       ________________________________   Jodelle Gross, MD, PhD   **Disclaimer: This note was dictated with voice recognition software. Similar sounding words can inadvertently be transcribed and this note may contain transcription errors which may not have been corrected upon publication of note.**

## 2021-02-27 NOTE — Progress Notes (Signed)
Pharmacist Chemotherapy Monitoring - Initial Assessment    Anticipated start date: 03/05/2021    The following has been reviewed per standard work regarding the patient's treatment regimen: The patient's diagnosis, treatment plan and drug doses, and organ/hematologic function Lab orders and baseline tests specific to treatment regimen  The treatment plan start date, drug sequencing, and pre-medications Prior authorization status  Patient's documented medication list, including drug-drug interaction screen and prescriptions for anti-emetics and supportive care specific to the treatment regimen The drug concentrations, fluid compatibility, administration routes, and timing of the medications to be used The patient's access for treatment and lifetime cumulative dose history, if applicable  The patient's medication allergies and previous infusion related reactions, if applicable   Changes made to treatment plan:  treatment plan date  Follow up needed:  N/A, pt has not insurance and no drugs eligible for asst   Curtis [name], [facility], 02/27/2021  3:27 PM

## 2021-03-01 ENCOUNTER — Ambulatory Visit (HOSPITAL_COMMUNITY)
Admission: RE | Admit: 2021-03-01 | Discharge: 2021-03-01 | Disposition: A | Discharge status: Home / Self Care | Payer: Self-pay | Source: Ambulatory Visit | Attending: Hematology and Oncology | Admitting: Hematology and Oncology

## 2021-03-01 ENCOUNTER — Other Ambulatory Visit: Payer: Self-pay

## 2021-03-01 ENCOUNTER — Other Ambulatory Visit: Payer: Self-pay | Admitting: Hematology and Oncology

## 2021-03-01 DIAGNOSIS — C20 Malignant neoplasm of rectum: Secondary | ICD-10-CM | POA: Insufficient documentation

## 2021-03-03 ENCOUNTER — Other Ambulatory Visit: Payer: Self-pay

## 2021-03-03 ENCOUNTER — Other Ambulatory Visit: Payer: Self-pay | Admitting: Internal Medicine

## 2021-03-03 ENCOUNTER — Other Ambulatory Visit: Payer: Self-pay | Admitting: Radiology

## 2021-03-03 ENCOUNTER — Inpatient Hospital Stay: Payer: Self-pay

## 2021-03-04 ENCOUNTER — Ambulatory Visit (HOSPITAL_COMMUNITY)
Admission: RE | Admit: 2021-03-04 | Discharge: 2021-03-04 | Disposition: A | Discharge status: Home / Self Care | Payer: Self-pay | Source: Ambulatory Visit | Attending: Hematology and Oncology | Admitting: Hematology and Oncology

## 2021-03-04 ENCOUNTER — Encounter: Payer: Self-pay | Admitting: Hematology and Oncology

## 2021-03-04 DIAGNOSIS — C20 Malignant neoplasm of rectum: Secondary | ICD-10-CM | POA: Insufficient documentation

## 2021-03-04 DIAGNOSIS — F17210 Nicotine dependence, cigarettes, uncomplicated: Secondary | ICD-10-CM | POA: Insufficient documentation

## 2021-03-04 HISTORY — PX: IR IMAGING GUIDED PORT INSERTION: IMG5740

## 2021-03-04 MED ORDER — LIDOCAINE HCL 1 % IJ SOLN
INTRAMUSCULAR | Status: AC
  Filled 2021-03-04: qty 20

## 2021-03-04 MED ORDER — SODIUM CHLORIDE 0.9 % IV SOLN: INTRAVENOUS | Status: DC

## 2021-03-04 MED ORDER — FENTANYL CITRATE (PF) 100 MCG/2ML IJ SOLN
INTRAMUSCULAR | Status: AC
  Filled 2021-03-04: qty 2

## 2021-03-04 MED ORDER — FENTANYL CITRATE (PF) 100 MCG/2ML IJ SOLN
INTRAMUSCULAR | Status: AC | PRN
  Administered 2021-03-04 (×2): 50 ug via INTRAVENOUS

## 2021-03-04 MED ORDER — MIDAZOLAM HCL 2 MG/2ML IJ SOLN
INTRAMUSCULAR | Status: AC | PRN
  Administered 2021-03-04 (×2): 1 mg via INTRAVENOUS
  Administered 2021-03-04: 2 mg via INTRAVENOUS

## 2021-03-04 MED ORDER — HEPARIN SOD (PORK) LOCK FLUSH 100 UNIT/ML IV SOLN
INTRAVENOUS | Status: AC
  Filled 2021-03-04: qty 5

## 2021-03-04 MED ORDER — MIDAZOLAM HCL 2 MG/2ML IJ SOLN
INTRAMUSCULAR | Status: AC
  Filled 2021-03-04: qty 4

## 2021-03-04 MED ORDER — HEPARIN SOD (PORK) LOCK FLUSH 100 UNIT/ML IV SOLN
INTRAVENOUS | Status: AC | PRN
  Administered 2021-03-04: 500 [IU] via INTRAVENOUS

## 2021-03-04 MED ORDER — LIDOCAINE HCL (PF) 1 % IJ SOLN
INTRAMUSCULAR | Status: AC | PRN
  Administered 2021-03-04: 20 mL

## 2021-03-04 NOTE — Progress Notes (Signed)
Called pt to introduce myself as his Arboriculturist.  Unfortunately there aren't any foundations offering copay assistance for his Dx and the type of ins he has.  I informed him of the [employer], went over what it covers and gave him the income requirement.  Pt would like to apply so he will bring his proof of income on 03/05/21.  If approved I will give him an expense sheet and my card for any questions or concerns he may have in the future.

## 2021-03-04 NOTE — Discharge Instructions (Signed)
Urgent needs - Interventional Radiology on call MD [phone]  Wound - May remove dressing and shower in 24 to 48 hours.  Keep site clean and dry.  Replace with bandaid as needed.  Do not submerge in tub or water until site healing well. If closed with glue, glue will flake off on its own.  If ordered by your provider, may start Emla cream in 2 weeks or after incision is healed.  After completion of treatment, your provider should have you set up for monthly port flushes.   

## 2021-03-04 NOTE — Consult Note (Signed)
Chief Complaint: Patient was seen in consultation today for  port a cath placement  Referring Physician(s): Dorsey,John T IV  Supervising Physician: Corrie Mckusick  Patient Status: [facility] - Out-pt  History of Present Illness: Curtis [name] is a 65 y.o. male smoker with history of newly diagnosed rectal cancer, status post resection with loop colostomy on 01/21/21. He presents today for port a cath placement for chemotherapy.   Past Medical History:  Diagnosis Date   Rectosigmoid cancer [facility])     Past Surgical History:  Procedure Laterality Date   BIOPSY  01/21/2021   Procedure: BIOPSY;  Surgeon: Mauri Pole, MD;  Location: [place];  Service: Endoscopy;;   COLON RESECTION N/A 01/21/2021   Procedure: LAPAROSCOPIC ASSISTED LOOP COLOSTOMY;  Surgeon: Donnie Mesa, MD;  Location: [place];  Service: General;  Laterality: N/A;   FLEXIBLE SIGMOIDOSCOPY N/A 01/21/2021   Procedure: FLEXIBLE SIGMOIDOSCOPY;  Surgeon: Mauri Pole, MD;  Location: [place] [facility];  Service: Endoscopy;  Laterality: N/A;   SUBMUCOSAL TATTOO INJECTION  01/21/2021   Procedure: SUBMUCOSAL TATTOO INJECTION;  Surgeon: Mauri Pole, MD;  Location: [place] [facility];  Service: Endoscopy;;    Allergies: Patient has no known allergies.  Medications: Prior to Admission medications   Medication Sig Start Date End Date Taking? Authorizing Provider  acetaminophen (TYLENOL) 500 MG tablet Take 1,000 mg by mouth daily.   Yes [provider]  lidocaine-prilocaine (EMLA) cream Apply 1 application topically as needed. Patient not taking: Reported on 02/26/2021 02/26/21   Orson Slick, MD  ondansetron (ZOFRAN) 8 MG tablet Take 1 tablet (8 mg total) by mouth every 8 (eight) hours as needed. Patient not taking: Reported on 02/26/2021 02/26/21   Orson Slick, MD  prochlorperazine (COMPAZINE) 10 MG tablet Take 1 tablet (10 mg total) by mouth every 6 (six) hours as needed for nausea or vomiting. Patient  not taking: Reported on 02/26/2021 02/26/21   Orson Slick, MD     Family History  Problem Relation Age of Onset   Hypertension Mother    Hypertension Father    Breast cancer Sister     Social History   Socioeconomic History   Marital status: Divorced    Spouse name: Not on file   Number of children: Not on file   Years of education: Not on file   Highest education level: Not on file  Occupational History   Not on file  Tobacco Use   Smoking status: Every Day    Types: Cigarettes   Smokeless tobacco: Never  Vaping Use   Vaping Use: Never used  Substance and Sexual Activity   Alcohol use: Never   Drug use: Never   Sexual activity: Never  Other Topics Concern   Not on file  Social History Narrative   Not on file   Social Determinants of Health   Financial Resource Strain: Low Risk    Difficulty of Paying Living Expenses: Not very hard  Food Insecurity: No Food Insecurity   Worried About Running Out of Food in the Last Year: Never true   [place] in the Last Year: Never true  Transportation Needs: No Transportation Needs   Lack of Transportation (Medical): No   Lack of Transportation (Non-Medical): No  Physical Activity: Not on file  Stress: Not on file  Social Connections: Not on file     Review of Systems denies fever,HA,CP,dyspnea, cough, abd/back pain,N/V; he has had hx rectal bleeding  Vital Signs:  BP 140/71   Pulse 68   Temp 98.1 F (36.7 C) (Oral)   Resp 16   SpO2 96%   Physical Exam awake/alert; chest- distant BS bilat; heart- RRR; abd- soft,+BS, intact colostomy, NT; no LE edema  Imaging: CT Chest W Contrast  Result Date: 02/20/2021 CLINICAL DATA:  New diagnosis of colorectal cancer. Staging chest CT. EXAM: CT CHEST WITH CONTRAST TECHNIQUE: Multidetector CT imaging of the chest was performed during intravenous contrast administration. CONTRAST:  32m OMNIPAQUE IOHEXOL 350 MG/ML SOLN COMPARISON:  None. FINDINGS: Cardiovascular: The heart  is normal in size. No pericardial effusion. There is mild tortuosity, ectasia and age advanced calcification of the thoracic aorta. No focal aneurysm or dissection. The branch vessels are patent. Scattered 2 vessel coronary artery calcifications. Mediastinum/Nodes: Small scattered mediastinal and hilar lymph nodes but no mass or overt adenopathy. The esophagus is unremarkable. Lungs/Pleura: Mild emphysematous changes but no acute pulmonary findings. No infiltrates, edema or effusions. No worrisome pulmonary lesions or pulmonary nodules to suggest pulmonary metastatic disease. No pleural effusions or pleural nodules. No bronchiectasis or interstitial lung disease. Upper Abdomen: No significant upper abdominal findings. Musculoskeletal: No chest wall mass, supraclavicular or axillary adenopathy. Thyroid gland is unremarkable. The bony thorax is intact. No worrisome bone lesions. IMPRESSION: 1. No CT findings for metastatic disease involving the chest. 2. Age advanced atherosclerotic calcification involving the thoracic aorta and coronary arteries. 3. Mild emphysematous changes but no acute pulmonary findings or worrisome pulmonary lesions. 4. Emphysema and aortic atherosclerosis. Aortic Atherosclerosis (ICD10-I70.0) and Emphysema (ICD10-J43.9). Electronically Signed   By: PMarijo SanesM.D.   On: 02/20/2021 14:52   MR PELVIS WO CONTRAST  Result Date: 03/02/2021 CLINICAL DATA:  Rectal carcinoma staging EXAM: MRI PELVIS WITHOUT CONTRAST TECHNIQUE: Multiplanar multisequence MR imaging of the pelvis was performed. No intravenous contrast was administered. Ultrasound gel was administered per rectum to optimize tumor evaluation. COMPARISON:  None. FINDINGS: TUMOR LOCATION Tumor distance from Anal Verge/Skin Surface:  9.8 cm Tumor distance to Internal Anal Sphincter: 5.9 cm TUMOR DESCRIPTION Circumferential Extent: Circumferential mass involving a long segment of the proximal rectum measuring approximately 9 cm in length.  There is clear extension beyond muscularis propria along the anterior, posterior and RIGHT side of the lesion from approximately 6 o'clock to 12 o'clock along the RIGHT side. This lobular extension beyond the muscularis propria measures 2.5 cm anteriorly (image 35/9). This anterior extension abuts the Seminal vesicles and base the bladder (image 35/9 and 38/9). No IV contrast administered. Tumor Length: 9 cm cm T - CATEGORY Extension through Muscularis Propria:  Yes>162mT3d Shortest Distance of any tumor/node from Mesorectal Fascia: Lesion abuts the mesorectal fascia along the RIGHT border (image 36/9.) 0 mm Extramural Vascular Invasion/Tumor Thrombus: No Invasion of Anterior Peritoneal Reflection: Yes=T4. Lesion abuts the anterior peritoneal reflection (image 21/2). Involvement of Adjacent Organs or Pelvic Sidewall: Potential involvement of the Seminal vesicles and the base of the bladder. Levator Ani Involvement: No N - CATEGORY Mesorectal Lymph Nodes >=58m27m4 or more=N2. Presacral node on the LEFT measures 6 mm (image 94/10. Adjacent midline node measures 6 mm on the same image. LEFT mesorectal node measures 7 mm on image 10/10. Low presacral space 6 mm node on image 5/10. Extra-mesorectal Lymphadenopathy:  Yes = N2. LEFT operator node measuring 6 mm on image 16/10. Other:  None. IMPRESSION: Rectal adenocarcinoma T stage: T4 Rectal adenocarcinoma N stage:  N2 Distance from tumor to the internal anal sphincter is 6  cm. Electronically Signed   By:  Suzy Bouchard M.D.   On: 03/02/2021 16:17   MR Abdomen W Wo Contrast  Result Date: 02/17/2021 CLINICAL DATA:  Rectal cancer staging EXAM: MRI ABDOMEN WITHOUT AND WITH CONTRAST TECHNIQUE: Multiplanar multisequence MR imaging of the abdomen was performed both before and after the administration of intravenous contrast. CONTRAST:  68m GADAVIST GADOBUTROL 1 MMOL/ML IV SOLN COMPARISON:  CT abdomen pelvis, 01/19/2021 FINDINGS: Lower chest: No acute findings.  Hepatobiliary: There is a small, flash filling hemangioma of the peripheral liver dome measuring 1.3 cm (series 16, image 14) as well as of the caudate lobe measuring 0.9 cm (series 16, image 27). No solid mass or other parenchymal abnormality identified. No biliary ductal dilatation. Pancreas: No mass, inflammatory changes, or other parenchymal abnormality identified. Spleen:  Within normal limits in size and appearance. Adrenals/Urinary Tract: No masses identified. Multiple bilateral renal calculi. No evidence of hydronephrosis. Stomach/Bowel: Partially imaged left lower quadrant end colostomy. Visualized portions within the abdomen are otherwise unremarkable. Vascular/Lymphatic: No pathologically enlarged lymph nodes identified. No abdominal aortic aneurysm demonstrated. Aortic atherosclerosis. Other:  None. Musculoskeletal: No suspicious bone lesions identified. IMPRESSION: 1. No evidence of metastatic disease within the abdomen. 2. Small benign flash filling hepatic hemangiomata. 3. Partially imaged left lower quadrant end colostomy. 4. Bilateral nephrolithiasis.  No hydronephrosis Aortic Atherosclerosis (ICD10-I70.0). Electronically Signed   By: AEddie CandleM.D.   On: 02/17/2021 15:19    Labs:  CBC: Recent Labs    01/22/21 0153 01/23/21 0159 02/09/21 1044 02/23/21 1115  WBC 13.0* 12.3* 8.0 6.6  HGB 10.3* 10.7* 11.3* 11.2*  HCT 30.8* 34.1* 35.4* 33.9*  PLT 354 424* 374 271    COAGS: No results for input(s): INR, APTT in the last 8760 hours.  BMP: Recent Labs    01/22/21 0153 01/23/21 0159 02/09/21 1044 02/23/21 1115  NA 136 135 143 141  K 2.9* 3.5 4.1 4.1  CL 98 102 105 106  CO2 '28 24 28 28  '$ GLUCOSE 134* 110* 111* 110*  BUN <5* <5* 15 12  CALCIUM 8.5* 8.2* 9.6 9.3  CREATININE 0.86 0.95 0.80 0.87  GFRNONAA >60 >60 >60 >60    LIVER FUNCTION TESTS: Recent Labs    01/20/21 0116 01/21/21 0312 02/09/21 1044 02/23/21 1115  BILITOT 0.6 0.6 0.3 0.3  AST 17 16 12* 12*  ALT  '12 13 8 10  '$ ALKPHOS 54 58 72 72  PROT 6.1* 6.1* 7.7 7.0  ALBUMIN 2.3* 2.4* 3.2* 3.4*    TUMOR MARKERS: No results for input(s): AFPTM, CEA, CA199, CHROMGRNA in the last 8760 hours.  Assessment and Plan: 65y.o. male smoker with history of newly diagnosed rectal cancer, status post resection with loop colostomy on 01/21/21. He presents today for port a cath placement for chemotherapy. Risks and benefits of image guided port-a-catheter placement was discussed with the patient including, but not limited to bleeding, infection, pneumothorax, or fibrin sheath development and need for additional procedures.  All of the patient's questions were answered, patient is agreeable to proceed. Consent signed and in chart.    Thank you for this interesting consult.  I greatly enjoyed meeting Curtis [name] and look forward to participating in their care.  A copy of this report was sent to the requesting provider on this date.  Electronically Signed: D. KRowe Robert PA-C 03/04/2021, 1:26 PM   I spent a total of  25 minutes   in face to face in clinical consultation, greater than 50% of which was counseling/coordinating care for port a  cath placement

## 2021-03-04 NOTE — Procedures (Signed)
Interventional Radiology Procedure Note  Procedure: Placement of a right IJ approach single lumen PowerPort.  Tip is positioned at the superior cavoatrial junction and catheter is ready for immediate use.  Complications: None Recommendations:  - Ok to shower tomorrow - Do not submerge for 7 days - Routine line care   Signed,  Akeelah Seppala S. Soren Pigman, DO   

## 2021-03-05 ENCOUNTER — Inpatient Hospital Stay (HOSPITAL_BASED_OUTPATIENT_CLINIC_OR_DEPARTMENT_OTHER): Payer: Self-pay | Admitting: Hematology and Oncology

## 2021-03-05 ENCOUNTER — Encounter: Payer: Self-pay | Admitting: Hematology and Oncology

## 2021-03-05 ENCOUNTER — Encounter: Payer: Self-pay | Admitting: General Practice

## 2021-03-05 ENCOUNTER — Other Ambulatory Visit: Payer: Self-pay

## 2021-03-05 ENCOUNTER — Inpatient Hospital Stay: Payer: Self-pay

## 2021-03-05 ENCOUNTER — Other Ambulatory Visit: Payer: Self-pay | Admitting: Hematology and Oncology

## 2021-03-05 ENCOUNTER — Inpatient Hospital Stay: Payer: Self-pay | Admitting: General Practice

## 2021-03-05 VITALS — BP 165/70 | HR 61 | Temp 97.2°F | Resp 17 | Wt 199.5 lb

## 2021-03-05 DIAGNOSIS — C20 Malignant neoplasm of rectum: Secondary | ICD-10-CM

## 2021-03-05 LAB — CBC WITH DIFFERENTIAL (CANCER CENTER ONLY)
Abs Immature Granulocytes: 0.02 10*3/uL (ref 0.00–0.07)
Basophils Absolute: 0 10*3/uL (ref 0.0–0.1)
Basophils Relative: 1 %
Eosinophils Absolute: 0.3 10*3/uL (ref 0.0–0.5)
Eosinophils Relative: 5 %
HCT: 34.3 % — ABNORMAL LOW (ref 39.0–52.0)
Hemoglobin: 11.2 g/dL — ABNORMAL LOW (ref 13.0–17.0)
Immature Granulocytes: 0 %
Lymphocytes Relative: 19 %
Lymphs Abs: 1.3 10*3/uL (ref 0.7–4.0)
MCH: 27.8 pg (ref 26.0–34.0)
MCHC: 32.7 g/dL (ref 30.0–36.0)
MCV: 85.1 fL (ref 80.0–100.0)
Monocytes Absolute: 0.4 10*3/uL (ref 0.1–1.0)
Monocytes Relative: 6 %
Neutro Abs: 4.8 10*3/uL (ref 1.7–7.7)
Neutrophils Relative %: 69 %
Platelet Count: 276 10*3/uL (ref 150–400)
RBC: 4.03 MIL/uL — ABNORMAL LOW (ref 4.22–5.81)
RDW: 15.6 % — ABNORMAL HIGH (ref 11.5–15.5)
WBC Count: 7 10*3/uL (ref 4.0–10.5)
nRBC: 0 % (ref 0.0–0.2)

## 2021-03-05 LAB — CMP (CANCER CENTER ONLY)
ALT: 12 U/L (ref 0–44)
AST: 14 U/L — ABNORMAL LOW (ref 15–41)
Albumin: 3.4 g/dL — ABNORMAL LOW (ref 3.5–5.0)
Alkaline Phosphatase: 82 U/L (ref 38–126)
Anion gap: 8 (ref 5–15)
BUN: 18 mg/dL (ref 8–23)
CO2: 25 mmol/L (ref 22–32)
Calcium: 9.1 mg/dL (ref 8.9–10.3)
Chloride: 106 mmol/L (ref 98–111)
Creatinine: 0.79 mg/dL (ref 0.61–1.24)
GFR, Estimated: >60 mL/min (ref >60)
Glucose, Bld: 140 mg/dL — ABNORMAL HIGH (ref 70–99)
Potassium: 4 mmol/L (ref 3.5–5.1)
Sodium: 139 mmol/L (ref 135–145)
Total Bilirubin: 0.2 mg/dL — ABNORMAL LOW (ref 0.3–1.2)
Total Protein: 7.1 g/dL (ref 6.5–8.1)

## 2021-03-05 LAB — MAGNESIUM: Magnesium: 1.6 mg/dL — ABNORMAL LOW (ref 1.7–2.4)

## 2021-03-05 MED ORDER — LEUCOVORIN CALCIUM INJECTION 350 MG
400.0000 mg/m2 | Freq: Once | INTRAVENOUS | Status: AC
  Administered 2021-03-05: 836 mg via INTRAVENOUS
  Filled 2021-03-05: qty 41.8

## 2021-03-05 MED ORDER — FLUOROURACIL CHEMO INJECTION 2.5 GM/50ML
400.0000 mg/m2 | Freq: Once | INTRAVENOUS | Status: AC
  Administered 2021-03-05: 850 mg via INTRAVENOUS
  Filled 2021-03-05: qty 17

## 2021-03-05 MED ORDER — PALONOSETRON HCL INJECTION 0.25 MG/5ML
0.2500 mg | Freq: Once | INTRAVENOUS | Status: AC
  Administered 2021-03-05: 0.25 mg via INTRAVENOUS
  Filled 2021-03-05: qty 5

## 2021-03-05 MED ORDER — OXALIPLATIN CHEMO INJECTION 100 MG/20ML
85.0000 mg/m2 | Freq: Once | INTRAVENOUS | Status: AC
  Administered 2021-03-05: 180 mg via INTRAVENOUS
  Filled 2021-03-05: qty 36

## 2021-03-05 MED ORDER — DEXTROSE 5 % IV SOLN
Freq: Once | INTRAVENOUS | Status: AC
  Filled 2021-03-05: qty 250

## 2021-03-05 MED ORDER — SODIUM CHLORIDE 0.9 % IV SOLN
10.0000 mg | Freq: Once | INTRAVENOUS | Status: AC
  Administered 2021-03-05: 10 mg via INTRAVENOUS
  Filled 2021-03-05: qty 10

## 2021-03-05 MED ORDER — SODIUM CHLORIDE 0.9 % IV SOLN
2400.0000 mg/m2 | INTRAVENOUS | Status: DC
  Administered 2021-03-05: 5000 mg via INTRAVENOUS
  Filled 2021-03-05: qty 100

## 2021-03-05 NOTE — Progress Notes (Signed)
Pt is approved for the $1000 [employer] grant.  

## 2021-03-05 NOTE — Patient Instructions (Signed)

## 2021-03-05 NOTE — Progress Notes (Signed)
[name]   [phone]  PROGRESS NOTE  Patient Care Team: Pcp, No as PCP - General Curtis Slick, MD as Consulting Physician (Medical Oncology) Curtis Bake, RN as Oncology Nurse Navigator (Oncology)  Hematological/Oncological History # Rectal Adenocarcinoma, T4N2M0. Stage IIIc 01/16/2021: presented to the emergency department with abdominal pain. CT abdomen/pelvis showed concern for mass lesion with perforation and surrounding adenopathy. 01/19/2021: repeat CT scan showed worsening inflammatory changes around the complex rectal process 01/21/2021: flexible sigmoidoscopy performed, lesion at 12 cm from the anal verge.  biopsy showed concern for adenocarcinoma. 01/21/2021: patient underwent a laparoscopic assisted loop colostomy with Dr. Georgette [name]. 02/09/2021: establish care with Dr. Lorenso [name]  03/05/2021: Cycle 1 Day 1 of neoadjuvant FOLFOX  Interval History:  Curtis [name] 65 y.o. male with medical history significant for rectal adenocarcinoma who presents for a follow up visit. The patient's last visit was on 02/26/2021. In the interim since the last visit he has completed MRI pelvis which completed staging and showed no evidence of metastatic disease.   On exam today Curtis [name] reports he has some difficulty with the port placement.  He was having some pain in his chest last night at the site of the port placement and was not able to go to bed after 2 AM.  He notes he is otherwise been well with no abdominal pain, increased ostomy output, or new concerns.  He is happy to have gained 4 pounds in the interim since her last visit.  He notes he is in good spirits and ready to start treatment.  He denies any bright red blood or dark stools.  He otherwise denies any fevers, chills, sweats, nausea, vomiting or diarrhea.  A full 10 point ROS is listed below.  Today we assured he had all the necessary medications, chemotherapy education, and supplies needed to  start treatment.  He is willing and able to proceed at this time.  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[place])     SURGICAL HISTORY: Past Surgical History:  Procedure Laterality Date   BIOPSY  01/21/2021   Procedure: BIOPSY;  Surgeon: Curtis Pole, MD;  Location: [place];  Service: Endoscopy;;   COLON RESECTION N/A 01/21/2021   Procedure: LAPAROSCOPIC ASSISTED LOOP COLOSTOMY;  Surgeon: Curtis Mesa, MD;  Location: [place];  Service: General;  Laterality: N/A;   FLEXIBLE SIGMOIDOSCOPY N/A 01/21/2021   Procedure: FLEXIBLE SIGMOIDOSCOPY;  Surgeon: Curtis Pole, MD;  Location: [facility];  Service: Endoscopy;  Laterality: N/A;   IR IMAGING GUIDED PORT INSERTION  03/04/2021   SUBMUCOSAL TATTOO INJECTION  01/21/2021   Procedure: SUBMUCOSAL TATTOO INJECTION;  Surgeon: Curtis Pole, MD;  Location: [facility];  Service: Endoscopy;;    SOCIAL HISTORY: Social History   Socioeconomic History   Marital status: Divorced    Spouse name: Not on file   Number of children: Not on file   Years of education: Not on file   Highest education level: Not on file  Occupational History   Not on file  Tobacco Use   Smoking status: Every Day    Types: Cigarettes   Smokeless tobacco: Never  Vaping Use   Vaping Use: Never used  Substance and Sexual Activity   Alcohol use: Never   Drug use: Never   Sexual activity: Never  Other Topics Concern   Not on file  Social History Narrative   Not on file   Social Determinants  of Health   Financial Resource Strain: Low Risk    Difficulty of Paying Living Expenses: Not very hard  Food Insecurity: No Food Insecurity   Worried About Running Out of Food in the Last Year: Never true   Ran Out of Food in the Last Year: Never true  Transportation Needs: No Transportation Needs   Lack of Transportation (Medical): No   Lack of Transportation (Non-Medical): No  Physical Activity: Not on file   Stress: Not on file  Social Connections: Not on file  Intimate Partner Violence: Not At Risk   Fear of Current or Ex-Partner: No   Emotionally Abused: No   Physically Abused: No   Sexually Abused: No    FAMILY HISTORY: Family History  Problem Relation Age of Onset   Hypertension Mother    Hypertension Father    Breast cancer Sister     ALLERGIES:  has No Known Allergies.  MEDICATIONS:  Current Outpatient Medications  Medication Sig Dispense Refill   lidocaine-prilocaine (EMLA) cream Apply 1 application topically as needed. 30 g 0   ondansetron (ZOFRAN) 8 MG tablet Take 1 tablet (8 mg total) by mouth every 8 (eight) hours as needed. 30 tablet 0   prochlorperazine (COMPAZINE) 10 MG tablet Take 1 tablet (10 mg total) by mouth every 6 (six) hours as needed for nausea or vomiting. 30 tablet 0   acetaminophen (TYLENOL) 500 MG tablet Take 1,000 mg by mouth daily.     No current facility-administered medications for this visit.    REVIEW OF SYSTEMS:   Constitutional: ( - ) fevers, ( - )  chills , ( - ) night sweats Eyes: ( - ) blurriness of vision, ( - ) double vision, ( - ) watery eyes Ears, nose, mouth, throat, and face: ( - ) mucositis, ( - ) sore throat Respiratory: ( - ) cough, ( - ) dyspnea, ( - ) wheezes Cardiovascular: ( - ) palpitation, ( - ) chest discomfort, ( - ) lower extremity swelling Gastrointestinal:  ( - ) nausea, ( - ) heartburn, ( - ) change in bowel habits Skin: ( - ) abnormal skin rashes Lymphatics: ( - ) new lymphadenopathy, ( - ) easy bruising Neurological: ( - ) numbness, ( - ) tingling, ( - ) new weaknesses Behavioral/Psych: ( - ) mood change, ( - ) new changes  All other systems were reviewed with the patient and are negative.  PHYSICAL EXAMINATION: ECOG PERFORMANCE STATUS: 1 - Symptomatic but completely ambulatory  Vitals:   03/05/21 0902  BP: (!) 165/70  Pulse: 61  Resp: 17  Temp: (!) 97.2 F (36.2 C)  SpO2: 100%    Filed Weights    03/05/21 0902  Weight: 199 lb 8 oz (90.5 kg)     GENERAL: Well-appearing middle-aged Curtis [name] male, alert, no distress and comfortable SKIN: skin color, texture, turgor are normal, no rashes or significant lesions EYES: conjunctiva are pink and non-injected, sclera clear LUNGS: clear to auscultation and percussion with normal breathing effort HEART: regular rate & rhythm and no murmurs and no lower extremity edema ABDOMEN: soft, non-tender, non-distended, normal bowel sounds.  Ostomy with some stool leakage, use of masking tape to hold in place. Musculoskeletal: no cyanosis of digits and no clubbing  PSYCH: alert & oriented x 3, fluent speech NEURO: no focal motor/sensory deficits  LABORATORY DATA:  I have reviewed the data as listed CBC Latest Ref Rng & Units 03/05/2021 02/23/2021 02/09/2021  WBC 4.0 - 10.5 K/uL 7.0 6.6  8.0  Hemoglobin 13.0 - 17.0 g/dL 11.2(L) 11.2(L) 11.3(L)  Hematocrit 39.0 - 52.0 % 34.3(L) 33.9(L) 35.4(L)  Platelets 150 - 400 K/uL 276 271 374    CMP Latest Ref Rng & Units 02/23/2021 02/09/2021 01/23/2021  Glucose 70 - 99 mg/dL 110(H) 111(H) 110(H)  BUN 8 - 23 mg/dL 12 15 <5(L)  Creatinine 0.61 - 1.24 mg/dL 0.87 0.80 0.95  Sodium 135 - 145 mmol/L 141 143 135  Potassium 3.5 - 5.1 mmol/L 4.1 4.1 3.5  Chloride 98 - 111 mmol/L 106 105 102  CO2 22 - 32 mmol/L '28 28 24  '$ Calcium 8.9 - 10.3 mg/dL 9.3 9.6 8.2(L)  Total Protein 6.5 - 8.1 g/dL 7.0 7.7 -  Total Bilirubin 0.3 - 1.2 mg/dL 0.3 0.3 -  Alkaline Phos 38 - 126 U/L 72 72 -  AST 15 - 41 U/L 12(L) 12(L) -  ALT 0 - 44 U/L 10 8 -    RADIOGRAPHIC STUDIES: I have personally reviewed the radiological images as listed and agreed with the findings in the report: no evidence of metastatic diease in the chest. MRI abdomen does not show metastatic disease.   CT Chest W Contrast  Result Date: 02/20/2021 CLINICAL DATA:  New diagnosis of colorectal cancer. Staging chest CT. EXAM: CT CHEST WITH CONTRAST TECHNIQUE: Multidetector CT  imaging of the chest was performed during intravenous contrast administration. CONTRAST:  52m OMNIPAQUE IOHEXOL 350 MG/ML SOLN COMPARISON:  None. FINDINGS: Cardiovascular: The heart is normal in size. No pericardial effusion. There is mild tortuosity, ectasia and age advanced calcification of the thoracic aorta. No focal aneurysm or dissection. The branch vessels are patent. Scattered 2 vessel coronary artery calcifications. Mediastinum/Nodes: Small scattered mediastinal and hilar lymph nodes but no mass or overt adenopathy. The esophagus is unremarkable. Lungs/Pleura: Mild emphysematous changes but no acute pulmonary findings. No infiltrates, edema or effusions. No worrisome pulmonary lesions or pulmonary nodules to suggest pulmonary metastatic disease. No pleural effusions or pleural nodules. No bronchiectasis or interstitial lung disease. Upper Abdomen: No significant upper abdominal findings. Musculoskeletal: No chest wall mass, supraclavicular or axillary adenopathy. Thyroid gland is unremarkable. The bony thorax is intact. No worrisome bone lesions. IMPRESSION: 1. No CT findings for metastatic disease involving the chest. 2. Age advanced atherosclerotic calcification involving the thoracic aorta and coronary arteries. 3. Mild emphysematous changes but no acute pulmonary findings or worrisome pulmonary lesions. 4. Emphysema and aortic atherosclerosis. Aortic Atherosclerosis (ICD10-I70.0) and Emphysema (ICD10-J43.9). Electronically Signed   By: PMarijo SanesM.D.   On: 02/20/2021 14:52   MR PELVIS WO CONTRAST  Result Date: 03/02/2021 CLINICAL DATA:  Rectal carcinoma staging EXAM: MRI PELVIS WITHOUT CONTRAST TECHNIQUE: Multiplanar multisequence MR imaging of the pelvis was performed. No intravenous contrast was administered. Ultrasound gel was administered per rectum to optimize tumor evaluation. COMPARISON:  None. FINDINGS: TUMOR LOCATION Tumor distance from Anal Verge/Skin Surface:  9.8 cm Tumor distance to  Internal Anal Sphincter: 5.9 cm TUMOR DESCRIPTION Circumferential Extent: Circumferential mass involving a long segment of the proximal rectum measuring approximately 9 cm in length. There is clear extension beyond muscularis propria along the anterior, posterior and RIGHT side of the lesion from approximately 6 o'clock to 12 o'clock along the RIGHT side. This lobular extension beyond the muscularis propria measures 2.5 cm anteriorly (image 35/9). This anterior extension abuts the Seminal vesicles and base the bladder (image 35/9 and 38/9). No IV contrast administered. Tumor Length: 9 cm cm T - CATEGORY Extension through Muscularis Propria:  Yes>169mT3d Shortest Distance  of any tumor/node from Mesorectal Fascia: Lesion abuts the mesorectal fascia along the RIGHT border (image 36/9.) 0 mm Extramural Vascular Invasion/Tumor Thrombus: No Invasion of Anterior Peritoneal Reflection: Yes=T4. Lesion abuts the anterior peritoneal reflection (image 21/2). Involvement of Adjacent Organs or Pelvic Sidewall: Potential involvement of the Seminal vesicles and the base of the bladder. Levator Ani Involvement: No N - CATEGORY Mesorectal Lymph Nodes >=12m: 4 or more=N2. Presacral node on the LEFT measures 6 mm (image 94/10. Adjacent midline node measures 6 mm on the same image. LEFT mesorectal node measures 7 mm on image 10/10. Low presacral space 6 mm node on image 5/10. Extra-mesorectal Lymphadenopathy:  Yes = N2. LEFT operator node measuring 6 mm on image 16/10. Other:  None. IMPRESSION: Rectal adenocarcinoma T stage: T4 Rectal adenocarcinoma N stage:  N2 Distance from tumor to the internal anal sphincter is 6  cm. Electronically Signed   By: SSuzy BouchardM.D.   On: 03/02/2021 16:17   MR Abdomen W Wo Contrast  Result Date: 02/17/2021 CLINICAL DATA:  Rectal cancer staging EXAM: MRI ABDOMEN WITHOUT AND WITH CONTRAST TECHNIQUE: Multiplanar multisequence MR imaging of the abdomen was performed both before and after the  administration of intravenous contrast. CONTRAST:  962mGADAVIST GADOBUTROL 1 MMOL/ML IV SOLN COMPARISON:  CT abdomen pelvis, 01/19/2021 FINDINGS: Lower chest: No acute findings. Hepatobiliary: There is a small, flash filling hemangioma of the peripheral liver dome measuring 1.3 cm (series 16, image 14) as well as of the caudate lobe measuring 0.9 cm (series 16, image 27). No solid mass or other parenchymal abnormality identified. No biliary ductal dilatation. Pancreas: No mass, inflammatory changes, or other parenchymal abnormality identified. Spleen:  Within normal limits in size and appearance. Adrenals/Urinary Tract: No masses identified. Multiple bilateral renal calculi. No evidence of hydronephrosis. Stomach/Bowel: Partially imaged left lower quadrant end colostomy. Visualized portions within the abdomen are otherwise unremarkable. Vascular/Lymphatic: No pathologically enlarged lymph nodes identified. No abdominal aortic aneurysm demonstrated. Aortic atherosclerosis. Other:  None. Musculoskeletal: No suspicious bone lesions identified. IMPRESSION: 1. No evidence of metastatic disease within the abdomen. 2. Small benign flash filling hepatic hemangiomata. 3. Partially imaged left lower quadrant end colostomy. 4. Bilateral nephrolithiasis.  No hydronephrosis Aortic Atherosclerosis (ICD10-I70.0). Electronically Signed   By: AlEddie Candle.D.   On: 02/17/2021 15:19   IR IMAGING GUIDED PORT INSERTION  Result Date: 03/04/2021 INDICATION: [id number] old male referred for port catheter EXAM: IMPLANTED PORT A CATH PLACEMENT WITH ULTRASOUND AND FLUOROSCOPIC GUIDANCE MEDICATIONS: None ANESTHESIA/SEDATION: Moderate (conscious) sedation was employed during this procedure. A total of Versed 4.0 mg and Fentanyl 100 mcg was administered intravenously. Moderate Sedation Time: 20 minutes. The patient's level of consciousness and vital signs were monitored continuously by radiology nursing throughout the procedure under my  direct supervision. FLUOROSCOPY TIME:  0 minutes, 12 seconds (4 mGy) COMPLICATIONS: None PROCEDURE: The procedure, risks, benefits, and alternatives were explained to the patient. Questions regarding the procedure were encouraged and answered. The patient understands and consents to the procedure. Ultrasound survey was performed with images stored and sent to PACs. The right neck and chest was prepped with chlorhexidine, and draped in the usual sterile fashion using maximum barrier technique (cap and mask, sterile gown, sterile gloves, large sterile sheet, hand hygiene and cutaneous antiseptic). Local anesthesia was attained by infiltration with 1% lidocaine without epinephrine. Ultrasound demonstrated patency of the right internal jugular vein, and this was documented with an image. Under real-time ultrasound guidance, this vein was accessed with a 21 gauge micropuncture  needle and image documentation was performed. A small dermatotomy was made at the access site with an 11 scalpel. A 0.018" wire was advanced into the SVC and used to estimate the length of the internal catheter. The access needle exchanged for a 79F micropuncture vascular sheath. The 0.018" wire was then removed and a 0.035" wire advanced into the IVC. An appropriate location for the subcutaneous reservoir was selected below the clavicle and an incision was made through the skin and underlying soft tissues. The subcutaneous tissues were then dissected using a combination of blunt and sharp surgical technique and a pocket was formed. A single lumen power injectable portacatheter was then tunneled through the subcutaneous tissues from the pocket to the dermatotomy and the port reservoir placed within the subcutaneous pocket. The venous access site was then serially dilated and a peel away vascular sheath placed over the wire. The wire was removed and the port catheter advanced into position under fluoroscopic guidance. The catheter tip is positioned  in the cavoatrial junction. This was documented with a spot image. The portacatheter was then tested and found to flush and aspirate well. The port was flushed with saline followed by 100 units/mL heparinized saline. The pocket was then closed in two layers using first subdermal inverted interrupted absorbable sutures followed by a running subcuticular suture. The epidermis was then sealed with Dermabond. The dermatotomy at the venous access site was also seal with Dermabond. Patient tolerated the procedure well and remained hemodynamically stable throughout. No complications encountered and no significant blood loss encountered IMPRESSION: Status post right IJ port catheter placement. Signed, Dulcy Fanny. Dellia Nims, RPVI [facility] Electronically Signed   By: Corrie Mckusick D.O.   On: 03/04/2021 15:33    ASSESSMENT & PLAN Curtis [name] 65 y.o. male with medical history significant for rectal adenocarcinoma who presents for a follow up visit.   At this time the patient's findings are most consistent with localized rectal adenocarcinoma.  MRI of the pelvis confirmed this is a T4 N2 M0 stage IIIc cancer.  As such we will plan to proceed with neoadjuvant chemotherapy, followed by chemoradiation, followed by reevaluation for consideration of transabdominal resection.  The initial treatment of choice would be neoadjuvant FOLFOX chemotherapy for 6-8 cycles.  Once this is complete we will transition to chemoradiation with p.o. capecitabine therapy.  When these are complete we will restage for consideration of surgical resection.  # Rectal Adenocarcinoma, T4N2M0 Stage IIIc --  staging with a CT scan of the chest and MRI abdomen/pelvis findings are consistent with localized disease.   --CEA modestly elevated at 17.44 on 02/09/2021 prior to start of therapy. --Given that this is adenocarcinoma of the rectum I would recommend proceeding with neoadjuvant  chemotherapy, followed by chemoradiation, followed by definitive surgery. Case discussed at [facility] --Patient has established care with Dr. Lisbeth Renshaw and radiation oncology. Plan: --today is Cycle 1 Day 1 of neoadjuvant FOLFOX  --assure CMP, CBC at each visit, and routine CEA levels --RTC in 2 weeks for Cycle 2.  #Supportive Care -- chemotherapy education complete -- port placed -- zofran '8mg'$  q8H PRN and compazine '10mg'$  PO q6H for nausea -- EMLA cream for port -- no pain medication required at this time.   No orders of the defined types were placed in this encounter.  All questions were answered. The patient knows to call the clinic with any problems, questions or concerns.  A total of more than 30 minutes were spent on  this encounter with face-to-face time and non-face-to-face time, including preparing to see the patient, ordering tests and/or medications, counseling the patient and coordination of care as outlined above.   Ledell Peoples, MD Department of Hematology/Oncology [place] [facility] Phone: [phone] Pager: [phone] Email: [email or web address]  03/05/2021 9:08 AM

## 2021-03-05 NOTE — Progress Notes (Signed)
[facility] CSW Progress Notes  Spoke w patient by phone.  He is currently receiving early retirement through [employer].  Referred to [facility] so they can assist w disability application if applicable.  He has been referred to [facility] for help w [place] application - Trinidad Curet is worker assigned.  She will reach out to him.    Edwyna Shell, LCSW Clinical Social Worker Phone:  [phone]

## 2021-03-05 NOTE — Patient Instructions (Signed)
Fluorouracil, 5-FU injection What is this medication? FLUOROURACIL, 5-FU (flure oh YOOR a sil) is a chemotherapy drug. It slows the growth of cancer cells. This medicine is used to treat many types of cancer like breast cancer, colon or rectal cancer, pancreatic cancer, and stomachcancer. This medicine may be used for other purposes; ask your health care provider orpharmacist if you have questions. COMMON BRAND NAME(S): Adrucil What should I tell my care team before I take this medication? They need to know if you have any of these conditions: blood disorders dihydropyrimidine dehydrogenase (DPD) deficiency infection (especially a virus infection such as chickenpox, cold sores, or herpes) kidney disease liver disease malnourished, poor nutrition recent or ongoing radiation therapy an unusual or allergic reaction to fluorouracil, other chemotherapy, other medicines, foods, dyes, or preservatives pregnant or trying to get pregnant breast-feeding How should I use this medication? This drug is given as an infusion or injection into a vein. It is administeredin a hospital or clinic by a specially trained health care professional. Talk to your pediatrician regarding the use of this medicine in children.Special care may be needed. Overdosage: If you think you have taken too much of this medicine contact apoison control center or emergency room at once. NOTE: This medicine is only for you. Do not share this medicine with others. What if I miss a dose? It is important not to miss your dose. Call your doctor or health careprofessional if you are unable to keep an appointment. What may interact with this medication? Do not take this medicine with any of the following medications: live virus vaccines This medicine may also interact with the following medications: medicines that treat or prevent blood clots like warfarin, enoxaparin, and dalteparin This list may not describe all possible  interactions. Give your health care provider a list of all the medicines, herbs, non-prescription drugs, or dietary supplements you use. Also tell them if you smoke, drink alcohol, or use illegaldrugs. Some items may interact with your medicine. What should I watch for while using this medication? Visit your doctor for checks on your progress. This drug may make you feel generally unwell. This is not uncommon, as chemotherapy can affect healthy cells as well as cancer cells. Report any side effects. Continue your course oftreatment even though you feel ill unless your doctor tells you to stop. In some cases, you may be given additional medicines to help with side effects.Follow all directions for their use. Call your doctor or health care professional for advice if you get a fever, chills or sore throat, or other symptoms of a cold or flu. Do not treat yourself. This drug decreases your body's ability to fight infections. Try toavoid being around people who are sick. This medicine may increase your risk to bruise or bleed. Call your doctor orhealth care professional if you notice any unusual bleeding. Be careful brushing and flossing your teeth or using a toothpick because you may get an infection or bleed more easily. If you have any dental work done,tell your dentist you are receiving this medicine. Avoid taking products that contain aspirin, acetaminophen, ibuprofen, naproxen, or ketoprofen unless instructed by your doctor. These medicines may hide afever. Do not become pregnant while taking this medicine. Women should inform their doctor if they wish to become pregnant or think they might be pregnant. There is a potential for serious side effects to an unborn child. Talk to your health care professional or pharmacist for more information. Do not breast-feed aninfant while taking this   medicine. Men should inform their doctor if they wish to father a child. This medicinemay lower sperm counts. Do not  treat diarrhea with over the counter products. Contact your doctor ifyou have diarrhea that lasts more than 2 days or if it is severe and watery. This medicine can make you more sensitive to the sun. Keep out of the sun. If you cannot avoid being in the sun, wear protective clothing and use sunscreen.Do not use sun lamps or tanning beds/booths. What side effects may I notice from receiving this medication? Side effects that you should report to your doctor or health care professionalas soon as possible: allergic reactions like skin rash, itching or hives, swelling of the face, lips, or tongue low blood counts - this medicine may decrease the number of white blood cells, red blood cells and platelets. You may be at increased risk for infections and bleeding. signs of infection - fever or chills, cough, sore throat, pain or difficulty passing urine signs of decreased platelets or bleeding - bruising, pinpoint red spots on the skin, black, tarry stools, blood in the urine signs of decreased red blood cells - unusually weak or tired, fainting spells, lightheadedness breathing problems changes in vision chest pain mouth sores nausea and vomiting pain, swelling, redness at site where injected pain, tingling, numbness in the hands or feet redness, swelling, or sores on hands or feet stomach pain unusual bleeding Side effects that usually do not require medical attention (report to yourdoctor or health care professional if they continue or are bothersome): changes in finger or toe nails diarrhea dry or itchy skin hair loss headache loss of appetite sensitivity of eyes to the light stomach upset unusually teary eyes This list may not describe all possible side effects. Call your doctor for medical advice about side effects. You may report side effects to FDA at1-800-FDA-1088. Where should I keep my medication? This drug is given in a hospital or clinic and will not be stored at home. NOTE:  This sheet is a summary. It may not cover all possible information. If you have questions about this medicine, talk to your doctor, pharmacist, orhealth care provider.  2022 [employer] Standard (2019-06-12 15:00:03) Oxaliplatin Injection What is this medication? OXALIPLATIN (ox AL i PLA tin) is a chemotherapy drug. It targets fast dividing cells, like cancer cells, and causes these cells to die. This medicine is usedto treat cancers of the colon and rectum, and many other cancers. This medicine may be used for other purposes; ask your health care provider orpharmacist if you have questions. COMMON BRAND NAME(S): Eloxatin What should I tell my care team before I take this medication? They need to know if you have any of these conditions: heart disease history of irregular heartbeat liver disease low blood counts, like white cells, platelets, or red blood cells lung or breathing disease, like asthma take medicines that treat or prevent blood clots tingling of the fingers or toes, or other nerve disorder an unusual or allergic reaction to oxaliplatin, other chemotherapy, other medicines, foods, dyes, or preservatives pregnant or trying to get pregnant breast-feeding How should I use this medication? This drug is given as an infusion into a vein. It is administered in a hospitalor clinic by a specially trained health care professional. Talk to your pediatrician regarding the use of this medicine in children.Special care may be needed. Overdosage: If you think you have taken too much of this medicine contact apoison control center or emergency room at once. NOTE: This medicine  is only for you. Do not share this medicine with others. What if I miss a dose? It is important not to miss a dose. Call your doctor or health careprofessional if you are unable to keep an appointment. What may interact with this medication? Do not take this medicine with any of the following  medications: cisapride dronedarone pimozide thioridazine This medicine may also interact with the following medications: aspirin and aspirin-like medicines certain medicines that treat or prevent blood clots like warfarin, apixaban, dabigatran, and rivaroxaban cisplatin cyclosporine diuretics medicines for infection like acyclovir, adefovir, amphotericin B, bacitracin, cidofovir, foscarnet, ganciclovir, gentamicin, pentamidine, vancomycin NSAIDs, medicines for pain and inflammation, like ibuprofen or naproxen other medicines that prolong the QT interval (an abnormal heart rhythm) pamidronate zoledronic acid This list may not describe all possible interactions. Give your health care provider a list of all the medicines, herbs, non-prescription drugs, or dietary supplements you use. Also tell them if you smoke, drink alcohol, or use illegaldrugs. Some items may interact with your medicine. What should I watch for while using this medication? Your condition will be monitored carefully while you are receiving thismedicine. You may need blood work done while you are taking this medicine. This medicine may make you feel generally unwell. This is not uncommon as chemotherapy can affect healthy cells as well as cancer cells. Report any side effects. Continue your course of treatment even though you feel ill unless yourhealthcare professional tells you to stop. This medicine can make you more sensitive to cold. Do not drink cold drinks or use ice. Cover exposed skin before coming in contact with cold temperatures or cold objects. When out in cold weather wear warm clothing and cover your mouth and nose to warm the air that goes into your lungs. Tell your doctor if you getsensitive to the cold. Do not become pregnant while taking this medicine or for 9 months after stopping it. Women should inform their health care professional if they wish to become pregnant or think they might be pregnant. Men should  not father a child while taking this medicine and for 6 months after stopping it. There is potential for serious side effects to an unborn child. Talk to your health careprofessional for more information. Do not breast-feed a child while taking this medicine or for 3 months afterstopping it. This medicine has caused ovarian failure in some women. This medicine may make it more difficult to get pregnant. Talk to your health care professional if [place] concerned about your fertility. This medicine has caused decreased sperm counts in some men. This may make it more difficult to father a child. Talk to your health care professional if [place] concerned about your fertility. This medicine may increase your risk of getting an infection. Call your health care professional for advice if you get a fever, chills, or sore throat, or other symptoms of a cold or flu. Do not treat yourself. Try to avoid beingaround people who are sick. Avoid taking medicines that contain aspirin, acetaminophen, ibuprofen, naproxen, or ketoprofen unless instructed by your health care professional.These medicines may hide a fever. Be careful brushing or flossing your teeth or using a toothpick because you may get an infection or bleed more easily. If you have any dental work done, Primary school teacher you are receiving this medicine. What side effects may I notice from receiving this medication? Side effects that you should report to your doctor or health care professionalas soon as possible: allergic reactions like skin rash, itching or hives,  swelling of the face, lips, or tongue breathing problems cough low blood counts - this medicine may decrease the number of white blood cells, red blood cells, and platelets. You may be at increased risk for infections and bleeding nausea, vomiting pain, redness, or irritation at site where injected pain, tingling, numbness in the hands or feet signs and symptoms of bleeding such as bloody or  black, tarry stools; red or dark brown urine; spitting up blood or brown material that looks like coffee grounds; red spots on the skin; unusual bruising or bleeding from the eyes, gums, or nose signs and symptoms of a dangerous change in heartbeat or heart rhythm like chest pain; dizziness; fast, irregular heartbeat; palpitations; feeling faint or lightheaded; falls signs and symptoms of infection like fever; chills; cough; sore throat; pain or trouble passing urine signs and symptoms of liver injury like dark yellow or brown urine; general ill feeling or flu-like symptoms; light-colored stools; loss of appetite; nausea; right upper belly pain; unusually weak or tired; yellowing of the eyes or skin signs and symptoms of low red blood cells or anemia such as unusually weak or tired; feeling faint or lightheaded; falls signs and symptoms of muscle injury like dark urine; trouble passing urine or change in the amount of urine; unusually weak or tired; muscle pain; back pain Side effects that usually do not require medical attention (report to yourdoctor or health care professional if they continue or are bothersome): changes in taste diarrhea gas hair loss loss of appetite mouth sores This list may not describe all possible side effects. Call your doctor for medical advice about side effects. You may report side effects to FDA at1-800-FDA-1088. Where should I keep my medication? This drug is given in a hospital or clinic and will not be stored at home. NOTE: This sheet is a summary. It may not cover all possible information. If you have questions about this medicine, talk to your doctor, pharmacist, orhealth care provider.  2022 [employer] Standard (2018-11-29 12:20:35) Leucovorin injection What is this medication? LEUCOVORIN (loo koe VOR in) is used to prevent or treat the harmful effects of some medicines. This medicine is used to treat anemia caused by a low amount of folic acid in the body.  It is also used with 5-fluorouracil (5-FU) to treatcolon cancer. This medicine may be used for other purposes; ask your health care provider orpharmacist if you have questions. What should I tell my care team before I take this medication? They need to know if you have any of these conditions: anemia from low levels of vitamin B-12 in the blood an unusual or allergic reaction to leucovorin, folic acid, other medicines, foods, dyes, or preservatives pregnant or trying to get pregnant breast-feeding How should I use this medication? This medicine is for injection into a muscle or into a vein. It is given by ahealth care professional in a hospital or clinic setting. Talk to your pediatrician regarding the use of this medicine in children.Special care may be needed. Overdosage: If you think you have taken too much of this medicine contact apoison control center or emergency room at once. NOTE: This medicine is only for you. Do not share this medicine with others. What if I miss a dose? This does not apply. What may interact with this medication? capecitabine fluorouracil phenobarbital phenytoin primidone trimethoprim-sulfamethoxazole This list may not describe all possible interactions. Give your health care provider a list of all the medicines, herbs, non-prescription drugs, or dietary supplements you use.  Also tell them if you smoke, drink alcohol, or use illegaldrugs. Some items may interact with your medicine. What should I watch for while using this medication? Your condition will be monitored carefully while you are receiving thismedicine. This medicine may increase the side effects of 5-fluorouracil, 5-FU. Tell your doctor or health care professional if you have diarrhea or mouth sores that donot get better or that get worse. What side effects may I notice from receiving this medication? Side effects that you should report to your doctor or health care professionalas soon as  possible: allergic reactions like skin rash, itching or hives, swelling of the face, lips, or tongue breathing problems fever, infection mouth sores unusual bleeding or bruising unusually weak or tired Side effects that usually do not require medical attention (report to yourdoctor or health care professional if they continue or are bothersome): constipation or diarrhea loss of appetite nausea, vomiting This list may not describe all possible side effects. Call your doctor for medical advice about side effects. You may report side effects to FDA at1-800-FDA-1088. Where should I keep my medication? This drug is given in a hospital or clinic and will not be stored at home. NOTE: This sheet is a summary. It may not cover all possible information. If you have questions about this medicine, talk to your doctor, pharmacist, orhealth care provider.  2022 [employer] Standard (2008-01-16 16:50:29)

## 2021-03-06 ENCOUNTER — Other Ambulatory Visit: Payer: Self-pay

## 2021-03-06 ENCOUNTER — Ambulatory Visit: Payer: Self-pay

## 2021-03-07 ENCOUNTER — Inpatient Hospital Stay: Payer: Self-pay

## 2021-03-07 ENCOUNTER — Other Ambulatory Visit: Payer: Self-pay

## 2021-03-07 VITALS — BP 168/82 | HR 91 | Temp 97.9°F | Resp 19

## 2021-03-07 DIAGNOSIS — C20 Malignant neoplasm of rectum: Secondary | ICD-10-CM

## 2021-03-07 MED ORDER — HEPARIN SOD (PORK) LOCK FLUSH 100 UNIT/ML IV SOLN
500.0000 [IU] | Freq: Once | INTRAVENOUS | Status: AC | PRN
  Administered 2021-03-07: 500 [IU]
  Filled 2021-03-07: qty 5

## 2021-03-07 MED ORDER — SODIUM CHLORIDE 0.9% FLUSH
10.0000 mL | INTRAVENOUS | Status: DC | PRN
  Administered 2021-03-07: 10 mL
  Filled 2021-03-07: qty 10

## 2021-03-10 ENCOUNTER — Encounter: Payer: Self-pay | Admitting: Hematology and Oncology

## 2021-03-18 ENCOUNTER — Inpatient Hospital Stay: Payer: Self-pay | Admitting: *Deleted

## 2021-03-18 ENCOUNTER — Inpatient Hospital Stay (HOSPITAL_BASED_OUTPATIENT_CLINIC_OR_DEPARTMENT_OTHER): Payer: Self-pay | Admitting: Hematology and Oncology

## 2021-03-18 ENCOUNTER — Other Ambulatory Visit: Payer: Self-pay | Admitting: Hematology and Oncology

## 2021-03-18 ENCOUNTER — Inpatient Hospital Stay: Payer: Self-pay

## 2021-03-18 ENCOUNTER — Other Ambulatory Visit: Payer: Self-pay

## 2021-03-18 VITALS — BP 137/82 | HR 79 | Temp 97.9°F | Resp 18 | Wt 194.7 lb

## 2021-03-18 DIAGNOSIS — C20 Malignant neoplasm of rectum: Secondary | ICD-10-CM

## 2021-03-18 LAB — CMP (CANCER CENTER ONLY)
ALT: 14 U/L (ref 0–44)
AST: 13 U/L — ABNORMAL LOW (ref 15–41)
Albumin: 3.5 g/dL (ref 3.5–5.0)
Alkaline Phosphatase: 88 U/L (ref 38–126)
Anion gap: 11 (ref 5–15)
BUN: 16 mg/dL (ref 8–23)
CO2: 24 mmol/L (ref 22–32)
Calcium: 9.3 mg/dL (ref 8.9–10.3)
Chloride: 105 mmol/L (ref 98–111)
Creatinine: 0.85 mg/dL (ref 0.61–1.24)
GFR, Estimated: >60 mL/min (ref >60)
Glucose, Bld: 159 mg/dL — ABNORMAL HIGH (ref 70–99)
Potassium: 3.6 mmol/L (ref 3.5–5.1)
Sodium: 140 mmol/L (ref 135–145)
Total Bilirubin: 0.3 mg/dL (ref 0.3–1.2)
Total Protein: 7.7 g/dL (ref 6.5–8.1)

## 2021-03-18 LAB — CBC WITH DIFFERENTIAL (CANCER CENTER ONLY)
Abs Immature Granulocytes: 0.02 10*3/uL (ref 0.00–0.07)
Basophils Absolute: 0 10*3/uL (ref 0.0–0.1)
Basophils Relative: 0 %
Eosinophils Absolute: 0.1 10*3/uL (ref 0.0–0.5)
Eosinophils Relative: 2 %
HCT: 35 % — ABNORMAL LOW (ref 39.0–52.0)
Hemoglobin: 11.5 g/dL — ABNORMAL LOW (ref 13.0–17.0)
Immature Granulocytes: 0 %
Lymphocytes Relative: 16 %
Lymphs Abs: 1.1 10*3/uL (ref 0.7–4.0)
MCH: 27.2 pg (ref 26.0–34.0)
MCHC: 32.9 g/dL (ref 30.0–36.0)
MCV: 82.7 fL (ref 80.0–100.0)
Monocytes Absolute: 0.5 10*3/uL (ref 0.1–1.0)
Monocytes Relative: 7 %
Neutro Abs: 5.1 10*3/uL (ref 1.7–7.7)
Neutrophils Relative %: 75 %
Platelet Count: 256 10*3/uL (ref 150–400)
RBC: 4.23 MIL/uL (ref 4.22–5.81)
RDW: 14.8 % (ref 11.5–15.5)
WBC Count: 6.9 10*3/uL (ref 4.0–10.5)
nRBC: 0 % (ref 0.0–0.2)

## 2021-03-18 MED ORDER — SODIUM CHLORIDE 0.9 % IV SOLN
2400.0000 mg/m2 | INTRAVENOUS | Status: DC
  Administered 2021-03-18: 5000 mg via INTRAVENOUS
  Filled 2021-03-18: qty 100

## 2021-03-18 MED ORDER — PALONOSETRON HCL INJECTION 0.25 MG/5ML
0.2500 mg | Freq: Once | INTRAVENOUS | Status: AC
Start: 2021-03-18 — End: 2021-03-18
  Administered 2021-03-18: 0.25 mg via INTRAVENOUS
  Filled 2021-03-18: qty 5

## 2021-03-18 MED ORDER — LEUCOVORIN CALCIUM INJECTION 350 MG
400.0000 mg/m2 | Freq: Once | INTRAVENOUS | Status: AC
  Administered 2021-03-18: 836 mg via INTRAVENOUS
  Filled 2021-03-18: qty 41.8

## 2021-03-18 MED ORDER — SODIUM CHLORIDE 0.9 % IV SOLN
10.0000 mg | Freq: Once | INTRAVENOUS | Status: AC
  Administered 2021-03-18: 10 mg via INTRAVENOUS
  Filled 2021-03-18: qty 10

## 2021-03-18 MED ORDER — FLUOROURACIL CHEMO INJECTION 2.5 GM/50ML
400.0000 mg/m2 | Freq: Once | INTRAVENOUS | Status: AC
  Administered 2021-03-18: 850 mg via INTRAVENOUS
  Filled 2021-03-18: qty 17

## 2021-03-18 MED ORDER — DEXTROSE 5 % IV SOLN: Freq: Once | INTRAVENOUS | Status: AC

## 2021-03-18 MED ORDER — OXALIPLATIN CHEMO INJECTION 100 MG/20ML
85.0000 mg/m2 | Freq: Once | INTRAVENOUS | Status: AC
  Administered 2021-03-18: 180 mg via INTRAVENOUS
  Filled 2021-03-18: qty 36

## 2021-03-18 NOTE — Progress Notes (Signed)
[place] [phone]   [phone]) [phone]  PROGRESS NOTE  Patient Care Team: Pcp, No as PCP - General Orson Slick, MD as Consulting Physician (Medical Oncology) Royston Bake, RN as Oncology Nurse Navigator (Oncology)  Hematological/Oncological History # Rectal Adenocarcinoma, T4N2M0. Stage IIIc 01/16/2021: presented to the emergency department with abdominal pain. CT abdomen/pelvis showed concern for mass lesion with perforation and surrounding adenopathy. 01/19/2021: repeat CT scan showed worsening inflammatory changes around the complex rectal process 01/21/2021: flexible sigmoidoscopy performed, lesion at 12 cm from the anal verge.  biopsy showed concern for adenocarcinoma. 01/21/2021: patient underwent a laparoscopic assisted loop colostomy with Dr. Georgette Dover. 02/09/2021: establish care with Dr. Lorenso Courier  03/05/2021: Cycle 1 Day 1 of neoadjuvant FOLFOX 03/18/2021: Cycle 2 Day 1 of neoadjuvant FOLFOX  Interval History:  Curtis [name] 65 y.o. male with medical history significant for rectal adenocarcinoma who presents for a follow up visit. The patient's last visit was on 03/05/2021. In the interim since the last visit he has completed Cycle 1 of neoadjuvant FOLFOX.   On exam today Curtis [name] reports he tolerated cycle 1 of chemotherapy quite well.  He does note that he is still having some port soreness at the time that is subsequently resolved.  His appetite has been quite good and he is surprised that he has had continued weight loss.  He has not had any issues with GI upset or neuropathy with his first treatment.  He does note that he feels ".  His ostomy output has been normal so that he has been changing the VAC every 3 days.  Ostomy output has been colored mostly of liquid consistency.  He denies any pain at this time.  He denies any bright red blood or dark stools.  He otherwise denies any fevers, chills, sweats, nausea, vomiting or diarrhea.  A full 10 point  ROS is listed below.   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[place])     SURGICAL HISTORY: Past Surgical History:  Procedure Laterality Date   BIOPSY  01/21/2021   Procedure: BIOPSY;  Surgeon: Mauri Pole, MD;  Location: [facility];  Service: Endoscopy;;   COLON RESECTION N/A 01/21/2021   Procedure: LAPAROSCOPIC ASSISTED LOOP COLOSTOMY;  Surgeon: Donnie Mesa, MD;  Location: [facility];  Service: General;  Laterality: N/A;   FLEXIBLE SIGMOIDOSCOPY N/A 01/21/2021   Procedure: FLEXIBLE SIGMOIDOSCOPY;  Surgeon: Mauri Pole, MD;  Location: [facility];  Service: Endoscopy;  Laterality: N/A;   IR IMAGING GUIDED PORT INSERTION  03/04/2021   SUBMUCOSAL TATTOO INJECTION  01/21/2021   Procedure: SUBMUCOSAL TATTOO INJECTION;  Surgeon: Mauri Pole, MD;  Location: [facility];  Service: Endoscopy;;    SOCIAL HISTORY: Social History   Socioeconomic History   Marital status: Divorced    Spouse name: Not on file   Number of children: Not on file   Years of education: Not on file   Highest education level: Not on file  Occupational History   Not on file  Tobacco Use   Smoking status: Every Day    Types: Cigarettes   Smokeless tobacco: Never  Vaping Use   Vaping Use: Never used  Substance and Sexual Activity   Alcohol use: Never   Drug use: Never   Sexual activity: Never  Other Topics Concern   Not on file  Social History Narrative   Not on file   Social Determinants of Health   Financial Resource  Strain: Low Risk    Difficulty of Paying Living Expenses: Not very hard  Food Insecurity: No Food Insecurity   Worried About Charity fundraiser in the Last Year: Never true   Ran Out of Food in the Last Year: Never true  Transportation Needs: No Transportation Needs   Lack of Transportation (Medical): No   Lack of Transportation (Non-Medical): No  Physical Activity: Not on file  Stress: Not on file  Social Connections:  Not on file  Intimate Partner Violence: Not At Risk   Fear of Current or Ex-Partner: No   Emotionally Abused: No   Physically Abused: No   Sexually Abused: No    FAMILY HISTORY: Family History  Problem Relation Age of Onset   Hypertension Mother    Hypertension Father    Breast cancer Sister     ALLERGIES:  has No Known Allergies.  MEDICATIONS:  Current Outpatient Medications  Medication Sig Dispense Refill   acetaminophen (TYLENOL) 500 MG tablet Take 1,000 mg by mouth daily.     lidocaine-prilocaine (EMLA) cream Apply 1 application topically as needed. 30 g 0   ondansetron (ZOFRAN) 8 MG tablet Take 1 tablet (8 mg total) by mouth every 8 (eight) hours as needed. 30 tablet 0   prochlorperazine (COMPAZINE) 10 MG tablet Take 1 tablet (10 mg total) by mouth every 6 (six) hours as needed for nausea or vomiting. 30 tablet 0   No current facility-administered medications for this visit.    REVIEW OF SYSTEMS:   Constitutional: ( - ) fevers, ( - )  chills , ( - ) night sweats Eyes: ( - ) blurriness of vision, ( - ) double vision, ( - ) watery eyes Ears, nose, mouth, throat, and face: ( - ) mucositis, ( - ) sore throat Respiratory: ( - ) cough, ( - ) dyspnea, ( - ) wheezes Cardiovascular: ( - ) palpitation, ( - ) chest discomfort, ( - ) lower extremity swelling Gastrointestinal:  ( - ) nausea, ( - ) heartburn, ( - ) change in bowel habits Skin: ( - ) abnormal skin rashes Lymphatics: ( - ) new lymphadenopathy, ( - ) easy bruising Neurological: ( - ) numbness, ( - ) tingling, ( - ) new weaknesses Behavioral/Psych: ( - ) mood change, ( - ) new changes  All other systems were reviewed with the patient and are negative.  PHYSICAL EXAMINATION: ECOG PERFORMANCE STATUS: 1 - Symptomatic but completely ambulatory  Vitals:   03/18/21 0847  BP: 137/82  Pulse: 79  Resp: 18  Temp: 97.9 F (36.6 C)  SpO2: 100%    Filed Weights   03/18/21 0847  Weight: 194 lb 11.2 oz (88.3 kg)      GENERAL: Well-appearing middle-aged [name], alert, no distress and comfortable SKIN: skin color, texture, turgor are normal, no rashes or significant lesions EYES: conjunctiva are pink and non-injected, sclera clear LUNGS: clear to auscultation and percussion with normal breathing effort HEART: regular rate & rhythm and no murmurs and no lower extremity edema ABDOMEN: soft, non-tender, non-distended, normal bowel sounds.  Ostomy with some stool leakage, use of masking tape to hold in place. Musculoskeletal: no cyanosis of digits and no clubbing  PSYCH: alert & oriented x 3, fluent speech NEURO: no focal motor/sensory deficits  LABORATORY DATA:  I have reviewed the data as listed CBC Latest Ref Rng & Units 03/18/2021 03/05/2021 02/23/2021  WBC 4.0 - 10.5 K/uL 6.9 7.0 6.6  Hemoglobin 13.0 - 17.0 g/dL 11.5(L)  11.2(L) 11.2(L)  Hematocrit 39.0 - 52.0 % 35.0(L) 34.3(L) 33.9(L)  Platelets 150 - 400 K/uL 256 276 271    CMP Latest Ref Rng & Units 03/18/2021 03/05/2021 02/23/2021  Glucose 70 - 99 mg/dL 159(H) 140(H) 110(H)  BUN 8 - 23 mg/dL '16 18 12  '$ Creatinine 0.61 - 1.24 mg/dL 0.85 0.79 0.87  Sodium 135 - 145 mmol/L 140 139 141  Potassium 3.5 - 5.1 mmol/L 3.6 4.0 4.1  Chloride 98 - 111 mmol/L 105 106 106  CO2 22 - 32 mmol/L '24 25 28  '$ Calcium 8.9 - 10.3 mg/dL 9.3 9.1 9.3  Total Protein 6.5 - 8.1 g/dL 7.7 7.1 7.0  Total Bilirubin 0.3 - 1.2 mg/dL 0.3 0.2(L) 0.3  Alkaline Phos 38 - 126 U/L 88 82 72  AST 15 - 41 U/L 13(L) 14(L) 12(L)  ALT 0 - 44 U/L '14 12 10    '$ RADIOGRAPHIC STUDIES: I have personally reviewed the radiological images as listed and agreed with the findings in the report: no evidence of metastatic diease in the chest. MRI abdomen does not show metastatic disease.   CT Chest W Contrast  Result Date: 02/20/2021 CLINICAL DATA:  New diagnosis of colorectal cancer. Staging chest CT. EXAM: CT CHEST WITH CONTRAST TECHNIQUE: Multidetector CT imaging of the chest was performed  during intravenous contrast administration. CONTRAST:  37m OMNIPAQUE IOHEXOL 350 MG/ML SOLN COMPARISON:  None. FINDINGS: Cardiovascular: The heart is normal in size. No pericardial effusion. There is mild tortuosity, ectasia and age advanced calcification of the thoracic aorta. No focal aneurysm or dissection. The branch vessels are patent. Scattered 2 vessel coronary artery calcifications. Mediastinum/Nodes: Small scattered mediastinal and hilar lymph nodes but no mass or overt adenopathy. The esophagus is unremarkable. Lungs/Pleura: Mild emphysematous changes but no acute pulmonary findings. No infiltrates, edema or effusions. No worrisome pulmonary lesions or pulmonary nodules to suggest pulmonary metastatic disease. No pleural effusions or pleural nodules. No bronchiectasis or interstitial lung disease. Upper Abdomen: No significant upper abdominal findings. Musculoskeletal: No chest wall mass, supraclavicular or axillary adenopathy. Thyroid gland is unremarkable. The bony thorax is intact. No worrisome bone lesions. IMPRESSION: 1. No CT findings for metastatic disease involving the chest. 2. Age advanced atherosclerotic calcification involving the thoracic aorta and coronary arteries. 3. Mild emphysematous changes but no acute pulmonary findings or worrisome pulmonary lesions. 4. Emphysema and aortic atherosclerosis. Aortic Atherosclerosis (ICD10-I70.0) and Emphysema (ICD10-J43.9). Electronically Signed   By: PMarijo SanesM.D.   On: 02/20/2021 14:52   MR PELVIS WO CONTRAST  Result Date: 03/02/2021 CLINICAL DATA:  Rectal carcinoma staging EXAM: MRI PELVIS WITHOUT CONTRAST TECHNIQUE: Multiplanar multisequence MR imaging of the pelvis was performed. No intravenous contrast was administered. Ultrasound gel was administered per rectum to optimize tumor evaluation. COMPARISON:  None. FINDINGS: TUMOR LOCATION Tumor distance from Anal Verge/Skin Surface:  9.8 cm Tumor distance to Internal Anal Sphincter: 5.9 cm  TUMOR DESCRIPTION Circumferential Extent: Circumferential mass involving a long segment of the proximal rectum measuring approximately 9 cm in length. There is clear extension beyond muscularis propria along the anterior, posterior and RIGHT side of the lesion from approximately 6 o'clock to 12 o'clock along the RIGHT side. This lobular extension beyond the muscularis propria measures 2.5 cm anteriorly (image 35/9). This anterior extension abuts the Seminal vesicles and base the bladder (image 35/9 and 38/9). No IV contrast administered. Tumor Length: 9 cm cm T - CATEGORY Extension through Muscularis Propria:  Yes>161mT3d Shortest Distance of any tumor/node from Mesorectal Fascia: Lesion abuts  the mesorectal fascia along the RIGHT border (image 36/9.) 0 mm Extramural Vascular Invasion/Tumor Thrombus: No Invasion of Anterior Peritoneal Reflection: Yes=T4. Lesion abuts the anterior peritoneal reflection (image 21/2). Involvement of Adjacent Organs or Pelvic Sidewall: Potential involvement of the Seminal vesicles and the base of the bladder. Levator Ani Involvement: No N - CATEGORY Mesorectal Lymph Nodes >=35m: 4 or more=N2. Presacral node on the LEFT measures 6 mm (image 94/10. Adjacent midline node measures 6 mm on the same image. LEFT mesorectal node measures 7 mm on image 10/10. Low presacral space 6 mm node on image 5/10. Extra-mesorectal Lymphadenopathy:  Yes = N2. LEFT operator node measuring 6 mm on image 16/10. Other:  None. IMPRESSION: Rectal adenocarcinoma T stage: T4 Rectal adenocarcinoma N stage:  N2 Distance from tumor to the internal anal sphincter is 6  cm. Electronically Signed   By: SSuzy BouchardM.D.   On: 03/02/2021 16:17   MR Abdomen W Wo Contrast  Result Date: 02/17/2021 CLINICAL DATA:  Rectal cancer staging EXAM: MRI ABDOMEN WITHOUT AND WITH CONTRAST TECHNIQUE: Multiplanar multisequence MR imaging of the abdomen was performed both before and after the administration of intravenous  contrast. CONTRAST:  92mGADAVIST GADOBUTROL 1 MMOL/ML IV SOLN COMPARISON:  CT abdomen pelvis, 01/19/2021 FINDINGS: Lower chest: No acute findings. Hepatobiliary: There is a small, flash filling hemangioma of the peripheral liver dome measuring 1.3 cm (series 16, image 14) as well as of the caudate lobe measuring 0.9 cm (series 16, image 27). No solid mass or other parenchymal abnormality identified. No biliary ductal dilatation. Pancreas: No mass, inflammatory changes, or other parenchymal abnormality identified. Spleen:  Within normal limits in size and appearance. Adrenals/Urinary Tract: No masses identified. Multiple bilateral renal calculi. No evidence of hydronephrosis. Stomach/Bowel: Partially imaged left lower quadrant end colostomy. Visualized portions within the abdomen are otherwise unremarkable. Vascular/Lymphatic: No pathologically enlarged lymph nodes identified. No abdominal aortic aneurysm demonstrated. Aortic atherosclerosis. Other:  None. Musculoskeletal: No suspicious bone lesions identified. IMPRESSION: 1. No evidence of metastatic disease within the abdomen. 2. Small benign flash filling hepatic hemangiomata. 3. Partially imaged left lower quadrant end colostomy. 4. Bilateral nephrolithiasis.  No hydronephrosis Aortic Atherosclerosis (ICD10-I70.0). Electronically Signed   By: AlEddie Candle.D.   On: 02/17/2021 15:19   IR IMAGING GUIDED PORT INSERTION  Result Date: 03/04/2021 INDICATION: 89ear old male referred for port catheter EXAM: IMPLANTED PORT A CATH PLACEMENT WITH ULTRASOUND AND FLUOROSCOPIC GUIDANCE MEDICATIONS: None ANESTHESIA/SEDATION: Moderate (conscious) sedation was employed during this procedure. A total of Versed 4.0 mg and Fentanyl 100 mcg was administered intravenously. Moderate Sedation Time: 20 minutes. The patient's level of consciousness and vital signs were monitored continuously by radiology nursing throughout the procedure under my direct supervision. FLUOROSCOPY  TIME:  0 minutes, 12 seconds (4 mGy) COMPLICATIONS: None PROCEDURE: The procedure, risks, benefits, and alternatives were explained to the patient. Questions regarding the procedure were encouraged and answered. The patient understands and consents to the procedure. Ultrasound survey was performed with images stored and sent to PACs. The right neck and chest was prepped with chlorhexidine, and draped in the usual sterile fashion using maximum barrier technique (cap and mask, sterile gown, sterile gloves, large sterile sheet, hand hygiene and cutaneous antiseptic). Local anesthesia was attained by infiltration with 1% lidocaine without epinephrine. Ultrasound demonstrated patency of the right internal jugular vein, and this was documented with an image. Under real-time ultrasound guidance, this vein was accessed with a 21 gauge micropuncture needle and image documentation was performed. A small  dermatotomy was made at the access site with an 11 scalpel. A 0.018" wire was advanced into the SVC and used to estimate the length of the internal catheter. The access needle exchanged for a 68F micropuncture vascular sheath. The 0.018" wire was then removed and a 0.035" wire advanced into the IVC. An appropriate location for the subcutaneous reservoir was selected below the clavicle and an incision was made through the skin and underlying soft tissues. The subcutaneous tissues were then dissected using a combination of blunt and sharp surgical technique and a pocket was formed. A single lumen power injectable portacatheter was then tunneled through the subcutaneous tissues from the pocket to the dermatotomy and the port reservoir placed within the subcutaneous pocket. The venous access site was then serially dilated and a peel away vascular sheath placed over the wire. The wire was removed and the port catheter advanced into position under fluoroscopic guidance. The catheter tip is positioned in the cavoatrial junction. This  was documented with a spot image. The portacatheter was then tested and found to flush and aspirate well. The port was flushed with saline followed by 100 units/mL heparinized saline. The pocket was then closed in two layers using first subdermal inverted interrupted absorbable sutures followed by a running subcuticular suture. The epidermis was then sealed with Dermabond. The dermatotomy at the venous access site was also seal with Dermabond. Patient tolerated the procedure well and remained hemodynamically stable throughout. No complications encountered and no significant blood loss encountered IMPRESSION: Status post right IJ port catheter placement. Signed, Dulcy Fanny. Dellia Nims, RPVI [facility] Radiology Electronically Signed   By: Corrie Mckusick D.O.   On: 03/04/2021 15:33    ASSESSMENT & PLAN Curtis [name] 65 y.o. male with medical history significant for rectal adenocarcinoma who presents for a follow up visit.   At this time the patient's findings are most consistent with localized rectal adenocarcinoma.  MRI of the pelvis confirmed this is a T4 N2 M0 stage IIIc cancer.  As such we will plan to proceed with neoadjuvant chemotherapy, followed by chemoradiation, followed by reevaluation for consideration of transabdominal resection.  The initial treatment of choice would be neoadjuvant FOLFOX chemotherapy for 6-8 cycles.  Once this is complete we will transition to chemoradiation with p.o. capecitabine therapy.  When these are complete we will restage for consideration of surgical resection.  # Rectal Adenocarcinoma, T4N2M0 Stage IIIc --  staging with a CT scan of the chest and MRI abdomen/pelvis findings are consistent with localized disease.   --CEA modestly elevated at 17.44 on 02/09/2021 prior to start of therapy. --Given that this is adenocarcinoma of the rectum I would recommend proceeding with neoadjuvant chemotherapy, followed by chemoradiation,  followed by definitive surgery. Case discussed at [facility] --Patient has established care with Dr. Lisbeth Renshaw and radiation oncology. Plan: --today is Cycle 2 Day 1 of neoadjuvant FOLFOX  --assure CMP, CBC at each visit, and routine CEA levels --RTC in 2 weeks for Cycle 3.  #Anemia --mild, today Hgb noted at 11.5 --likely secondary to active malignancy and modest iron deficiency. --continue to monitor  #Supportive Care -- chemotherapy education complete -- port placed -- zofran '8mg'$  q8H PRN and compazine '10mg'$  PO q6H for nausea -- EMLA cream for port -- no pain medication required at this time.   No orders of the defined types were placed in this encounter.  All questions were answered. The patient knows to call the clinic with any problems, questions or  concerns.  A total of more than 30 minutes were spent on this encounter with face-to-face time and non-face-to-face time, including preparing to see the patient, ordering tests and/or medications, counseling the patient and coordination of care as outlined above.   Ledell Peoples, MD Department of Hematology/Oncology [place] at [facility] Phone: [phone] Pager: [phone] Email: [email or web address]  03/18/2021 8:57 AM

## 2021-03-18 NOTE — Progress Notes (Signed)
[place] Work  Holiday representative met with patient in the infusion room to complete [employer], and provided first disbursement of funds.  Patient stated he had turned in all requested paperwork for his medicaid application, but had not received any information.  Patient stated he planned to contact Ms. Trenton Gammon with [employer] to get an update on the status of his application.  CSW encouraged patient to call with additional needs or concerns.    Johnnye Lana, MSW, LCSW, OSW-C Clinical Social Worker [facility] [phone]

## 2021-03-18 NOTE — Patient Instructions (Addendum)
[facility] MEDICAL [facility]COLOGY  Discharge Instructions: ?Thank you for choosing [facility] to provide your oncology and hematology care.  ? ?If you have a lab appointment with the Cancer Center, please go directly to the Cancer Center and check in at the registration area. ?  ?Wear comfortable clothing and clothing appropriate for easy access to any Portacath or PICC line.  ? ?We strive to give you quality time with your provider. You may need to reschedule your appointment if you arrive late (15 or more minutes).  Arriving late affects you and other patients whose appointments are after yours.  Also, if you miss three or more appointments without notifying the office, you may be dismissed from the clinic at the provider?s discretion.    ?  ?For prescription refill requests, have your pharmacy contact our office and allow 72 hours for refills to be completed.   ? ?Today you received the following chemotherapy and/or immunotherapy agents: Oxaliplatin, Leucovorin, Fluorouracil.     ?  ?To help prevent nausea and vomiting after your treatment, we encourage you to take your nausea medication as directed. ? ?BELOW ARE SYMPTOMS THAT SHOULD BE REPORTED IMMEDIATELY: ?*FEVER GREATER THAN 100.4 F (38 ?C) OR HIGHER ?*CHILLS OR SWEATING ?*NAUSEA AND VOMITING THAT IS NOT CONTROLLED WITH YOUR NAUSEA MEDICATION ?*UNUSUAL SHORTNESS OF BREATH ?*UNUSUAL BRUISING OR BLEEDING ?*URINARY PROBLEMS (pain or burning when urinating, or frequent urination) ?*BOWEL PROBLEMS (unusual diarrhea, constipation, pain near the anus) ?TENDERNESS IN MOUTH AND THROAT WITH OR WITHOUT PRESENCE OF ULCERS (sore throat, sores in mouth, or a toothache) ?UNUSUAL RASH, SWELLING OR PAIN  ?UNUSUAL VAGINAL DISCHARGE OR ITCHING  ? ?Items with * indicate a potential emergency and should be followed up as soon as possible or go to the Emergency Department if any problems should occur. ? ?Please show the CHEMOTHERAPY ALERT CARD or  IMMUNOTHERAPY ALERT CARD at check-in to the Emergency Department and triage nurse. ? ?Should you have questions after your visit or need to cancel or reschedule your appointment, please contact [facility]ICAL [facility]COL[facility]  Dept: [phone]  and follow the prompts.  Office hours are 8:00 a.m. to 4:30 p.m. Monday - Friday. Please note that voicemails left after 4:00 p.m. may not be returned until the following business day.  We are closed weekends and major holidays. You have access to a nurse at all times for urgent questions. Please call the main number to the clinic Dept: [phone] and follow the prompts. ? ? ?For any non-urgent questions, you may also contact your provider using MyChart. We now offer e-Visits for anyone 18 and older to request care online for non-urgent symptoms. For details visit [email or web address]. ?  ?Also download the MyChart app! Go to the app store, search "MyChart", open the app, select [facility] [place], and log in with your MyChart username and password. ? ?Due to Covid, a mask is required upon entering the hospital/clinic. If you do not have a mask, one will be given to you upon arrival. For doctor visits, patients may have 1 support person aged 18 or older with them. For treatment visits, patients cannot have anyone with them due to current Covid guidelines and our immunocompromised population.  ? ?

## 2021-03-20 ENCOUNTER — Other Ambulatory Visit: Payer: Self-pay

## 2021-03-20 ENCOUNTER — Inpatient Hospital Stay: Payer: Self-pay

## 2021-03-20 VITALS — BP 154/87 | HR 85 | Temp 98.0°F | Resp 18

## 2021-03-20 DIAGNOSIS — C20 Malignant neoplasm of rectum: Secondary | ICD-10-CM

## 2021-03-20 MED ORDER — HEPARIN SOD (PORK) LOCK FLUSH 100 UNIT/ML IV SOLN
500.0000 [IU] | Freq: Once | INTRAVENOUS | Status: AC | PRN
Start: 2021-03-20 — End: 2021-03-20
  Administered 2021-03-20: 500 [IU]

## 2021-03-20 MED ORDER — SODIUM CHLORIDE 0.9% FLUSH
10.0000 mL | INTRAVENOUS | Status: DC | PRN
Start: 2021-03-20 — End: 2021-03-20
  Administered 2021-03-20: 10 mL

## 2021-04-02 ENCOUNTER — Other Ambulatory Visit: Payer: Self-pay

## 2021-04-02 ENCOUNTER — Inpatient Hospital Stay: Payer: Self-pay

## 2021-04-02 ENCOUNTER — Inpatient Hospital Stay: Payer: Self-pay | Attending: Hematology and Oncology | Admitting: Hematology and Oncology

## 2021-04-02 VITALS — BP 162/68 | HR 66 | Temp 97.2°F | Resp 20 | Wt 196.7 lb

## 2021-04-02 DIAGNOSIS — D649 Anemia, unspecified: Secondary | ICD-10-CM | POA: Insufficient documentation

## 2021-04-02 DIAGNOSIS — Z803 Family history of malignant neoplasm of breast: Secondary | ICD-10-CM | POA: Insufficient documentation

## 2021-04-02 DIAGNOSIS — Z8249 Family history of ischemic heart disease and other diseases of the circulatory system: Secondary | ICD-10-CM | POA: Insufficient documentation

## 2021-04-02 DIAGNOSIS — C20 Malignant neoplasm of rectum: Secondary | ICD-10-CM

## 2021-04-02 DIAGNOSIS — Z5111 Encounter for antineoplastic chemotherapy: Secondary | ICD-10-CM | POA: Insufficient documentation

## 2021-04-02 DIAGNOSIS — F17210 Nicotine dependence, cigarettes, uncomplicated: Secondary | ICD-10-CM | POA: Insufficient documentation

## 2021-04-02 DIAGNOSIS — Z79899 Other long term (current) drug therapy: Secondary | ICD-10-CM | POA: Insufficient documentation

## 2021-04-02 DIAGNOSIS — Z95828 Presence of other vascular implants and grafts: Secondary | ICD-10-CM

## 2021-04-02 LAB — CMP (CANCER CENTER ONLY)
ALT: 18 U/L (ref 0–44)
AST: 19 U/L (ref 15–41)
Albumin: 3.5 g/dL (ref 3.5–5.0)
Alkaline Phosphatase: 94 U/L (ref 38–126)
Anion gap: 11 (ref 5–15)
BUN: 15 mg/dL (ref 8–23)
CO2: 23 mmol/L (ref 22–32)
Calcium: 9.2 mg/dL (ref 8.9–10.3)
Chloride: 106 mmol/L (ref 98–111)
Creatinine: 0.91 mg/dL (ref 0.61–1.24)
GFR, Estimated: >60 mL/min (ref >60)
Glucose, Bld: 190 mg/dL — ABNORMAL HIGH (ref 70–99)
Potassium: 3.9 mmol/L (ref 3.5–5.1)
Sodium: 140 mmol/L (ref 135–145)
Total Bilirubin: 0.3 mg/dL (ref 0.3–1.2)
Total Protein: 7.3 g/dL (ref 6.5–8.1)

## 2021-04-02 LAB — CBC WITH DIFFERENTIAL (CANCER CENTER ONLY)
Abs Immature Granulocytes: 0 10*3/uL (ref 0.00–0.07)
Basophils Absolute: 0 10*3/uL (ref 0.0–0.1)
Basophils Relative: 1 %
Eosinophils Absolute: 0.1 10*3/uL (ref 0.0–0.5)
Eosinophils Relative: 3 %
HCT: 35 % — ABNORMAL LOW (ref 39.0–52.0)
Hemoglobin: 11.5 g/dL — ABNORMAL LOW (ref 13.0–17.0)
Immature Granulocytes: 0 %
Lymphocytes Relative: 34 %
Lymphs Abs: 1.1 10*3/uL (ref 0.7–4.0)
MCH: 26.8 pg (ref 26.0–34.0)
MCHC: 32.9 g/dL (ref 30.0–36.0)
MCV: 81.6 fL (ref 80.0–100.0)
Monocytes Absolute: 0.3 10*3/uL (ref 0.1–1.0)
Monocytes Relative: 10 %
Neutro Abs: 1.7 10*3/uL (ref 1.7–7.7)
Neutrophils Relative %: 52 %
Platelet Count: 186 10*3/uL (ref 150–400)
RBC: 4.29 MIL/uL (ref 4.22–5.81)
RDW: 15.5 % (ref 11.5–15.5)
WBC Count: 3.2 10*3/uL — ABNORMAL LOW (ref 4.0–10.5)
nRBC: 0 % (ref 0.0–0.2)

## 2021-04-02 MED ORDER — SODIUM CHLORIDE 0.9% FLUSH
10.0000 mL | INTRAVENOUS | Status: DC | PRN
Start: 2021-04-02 — End: 2021-04-02

## 2021-04-02 MED ORDER — SODIUM CHLORIDE 0.9 % IV SOLN
2400.0000 mg/m2 | INTRAVENOUS | Status: DC
  Administered 2021-04-02: 5000 mg via INTRAVENOUS
  Filled 2021-04-02: qty 100

## 2021-04-02 MED ORDER — SODIUM CHLORIDE 0.9% FLUSH
10.0000 mL | Freq: Once | INTRAVENOUS | Status: AC
  Administered 2021-04-02: 10 mL

## 2021-04-02 MED ORDER — SODIUM CHLORIDE 0.9 % IV SOLN
10.0000 mg | Freq: Once | INTRAVENOUS | Status: AC
  Administered 2021-04-02: 10 mg via INTRAVENOUS
  Filled 2021-04-02: qty 10

## 2021-04-02 MED ORDER — DEXTROSE 5 % IV SOLN: Freq: Once | INTRAVENOUS | Status: AC

## 2021-04-02 MED ORDER — FLUOROURACIL CHEMO INJECTION 2.5 GM/50ML
400.0000 mg/m2 | Freq: Once | INTRAVENOUS | Status: AC
Start: 2021-04-02 — End: 2021-04-02
  Administered 2021-04-02: 850 mg via INTRAVENOUS
  Filled 2021-04-02: qty 17

## 2021-04-02 MED ORDER — OXALIPLATIN CHEMO INJECTION 100 MG/20ML
85.0000 mg/m2 | Freq: Once | INTRAVENOUS | Status: AC
  Administered 2021-04-02: 180 mg via INTRAVENOUS
  Filled 2021-04-02: qty 36

## 2021-04-02 MED ORDER — LEUCOVORIN CALCIUM INJECTION 350 MG
400.0000 mg/m2 | Freq: Once | INTRAVENOUS | Status: AC
  Administered 2021-04-02: 836 mg via INTRAVENOUS
  Filled 2021-04-02: qty 41.8

## 2021-04-02 MED ORDER — PALONOSETRON HCL INJECTION 0.25 MG/5ML
0.2500 mg | Freq: Once | INTRAVENOUS | Status: AC
  Administered 2021-04-02: 0.25 mg via INTRAVENOUS
  Filled 2021-04-02: qty 5

## 2021-04-02 NOTE — Progress Notes (Signed)
[place] [phone]   [phone]  PROGRESS NOTE  Patient Care Team: Pcp, No as PCP - General Orson Slick, MD as Consulting Physician (Medical Oncology) Royston Bake, RN as Oncology Nurse Navigator (Oncology)  Hematological/Oncological History # Rectal Adenocarcinoma, T4N2M0. Stage IIIc 01/16/2021: presented to the emergency department with abdominal pain. CT abdomen/pelvis showed concern for mass lesion with perforation and surrounding adenopathy. 01/19/2021: repeat CT scan showed worsening inflammatory changes around the complex rectal process 01/21/2021: flexible sigmoidoscopy performed, lesion at 12 cm from the anal verge.  biopsy showed concern for adenocarcinoma. 01/21/2021: patient underwent a laparoscopic assisted loop colostomy with Dr. Georgette Dover. 02/09/2021: establish care with Dr. Lorenso Courier  03/05/2021: Cycle 1 Day 1 of neoadjuvant FOLFOX 03/18/2021: Cycle 2 Day 1 of neoadjuvant FOLFOX 04/02/2021: Cycle 3 Day 1 of neoadjuvant FOLFOX  Interval History:  Curtis [name] 65 y.o. male with medical history significant for rectal adenocarcinoma who presents for a follow up visit. The patient's last visit was on 03/18/2021. In the interim since the last visit he has completed Cycle 2 of neoadjuvant FOLFOX.   On exam today Mr. Stitz reports he tolerated his first 2 cycles of chemotherapy very well.  He did not notice any symptoms as a result of these infusions.  He denies any loose watery ostomy output, change in appetite, or fatigue.  He notes that his energy is good and he has been doing his best to continue walking.  He notes that he is somewhat "bored" as he is not currently working but is grateful for the time to rest while receiving treatment.  He is willing and able to proceed with treatment at this time.  He otherwise denies any fevers, chills, sweats, nausea, vomiting or diarrhea.  A full 10 point ROS is listed below.  MEDICAL HISTORY:  Past Medical  History:  Diagnosis Date   Rectosigmoid cancer ([facility])     SURGICAL HISTORY: Past Surgical History:  Procedure Laterality Date   BIOPSY  01/21/2021   Procedure: BIOPSY;  Surgeon: Mauri Pole, MD;  Location: [facility];  Service: Endoscopy;;   COLON RESECTION N/A 01/21/2021   Procedure: LAPAROSCOPIC ASSISTED LOOP COLOSTOMY;  Surgeon: Donnie Mesa, MD;  Location: [facility];  Service: General;  Laterality: N/A;   FLEXIBLE SIGMOIDOSCOPY N/A 01/21/2021   Procedure: FLEXIBLE SIGMOIDOSCOPY;  Surgeon: Mauri Pole, MD;  Location: [facility];  Service: Endoscopy;  Laterality: N/A;   IR IMAGING GUIDED PORT INSERTION  03/04/2021   SUBMUCOSAL TATTOO INJECTION  01/21/2021   Procedure: SUBMUCOSAL TATTOO INJECTION;  Surgeon: Mauri Pole, MD;  Location: [facility];  Service: Endoscopy;;    SOCIAL HISTORY: Social History   Socioeconomic History   Marital status: Divorced    Spouse name: Not on file   Number of children: Not on file   Years of education: Not on file   Highest education level: Not on file  Occupational History   Not on file  Tobacco Use   Smoking status: Every Day    Types: Cigarettes   Smokeless tobacco: Never  Vaping Use   Vaping Use: Never used  Substance and Sexual Activity   Alcohol use: Never   Drug use: Never   Sexual activity: Never  Other Topics Concern   Not on file  Social History Narrative   Not on file   Social Determinants of Health   Financial Resource Strain: Low Risk    Difficulty of Paying Living Expenses: Not very hard  Food  Insecurity: No Food Insecurity   Worried About Charity fundraiser in the Last Year: Never true   Ran Out of Food in the Last Year: Never true  Transportation Needs: No Transportation Needs   Lack of Transportation (Medical): No   Lack of Transportation (Non-Medical): No  Physical Activity: Not on file  Stress: Not on file  Social Connections: Not on file  Intimate Partner Violence: Not At Risk   Fear  of Current or Ex-Partner: No   Emotionally Abused: No   Physically Abused: No   Sexually Abused: No    FAMILY HISTORY: Family History  Problem Relation Age of Onset   Hypertension Mother    Hypertension Father    Breast cancer Sister     ALLERGIES:  has No Known Allergies.  MEDICATIONS:  Current Outpatient Medications  Medication Sig Dispense Refill   acetaminophen (TYLENOL) 500 MG tablet Take 1,000 mg by mouth daily.     lidocaine-prilocaine (EMLA) cream Apply 1 application topically as needed. 30 g 0   ondansetron (ZOFRAN) 8 MG tablet Take 1 tablet (8 mg total) by mouth every 8 (eight) hours as needed. 30 tablet 0   prochlorperazine (COMPAZINE) 10 MG tablet Take 1 tablet (10 mg total) by mouth every 6 (six) hours as needed for nausea or vomiting. 30 tablet 0   No current facility-administered medications for this visit.    REVIEW OF SYSTEMS:   Constitutional: ( - ) fevers, ( - )  chills , ( - ) night sweats Eyes: ( - ) blurriness of vision, ( - ) double vision, ( - ) watery eyes Ears, nose, mouth, throat, and face: ( - ) mucositis, ( - ) sore throat Respiratory: ( - ) cough, ( - ) dyspnea, ( - ) wheezes Cardiovascular: ( - ) palpitation, ( - ) chest discomfort, ( - ) lower extremity swelling Gastrointestinal:  ( - ) nausea, ( - ) heartburn, ( - ) change in bowel habits Skin: ( - ) abnormal skin rashes Lymphatics: ( - ) new lymphadenopathy, ( - ) easy bruising Neurological: ( - ) numbness, ( - ) tingling, ( - ) new weaknesses Behavioral/Psych: ( - ) mood change, ( - ) new changes  All other systems were reviewed with the patient and are negative.  PHYSICAL EXAMINATION: ECOG PERFORMANCE STATUS: 1 - Symptomatic but completely ambulatory  Vitals:   04/02/21 1025  BP: (!) 162/68  Pulse: 66  Resp: 20  Temp: (!) 97.2 F (36.2 C)  SpO2: 100%    Filed Weights   04/02/21 1025  Weight: 196 lb 11.2 oz (89.2 kg)     GENERAL: Well-appearing middle-aged [name],  alert, no distress and comfortable SKIN: skin color, texture, turgor are normal, no rashes or significant lesions EYES: conjunctiva are pink and non-injected, sclera clear LUNGS: clear to auscultation and percussion with normal breathing effort HEART: regular rate & rhythm and no murmurs and no lower extremity edema ABDOMEN: soft, non-tender, non-distended, normal bowel sounds.  Ostomy clean dry and intact  Musculoskeletal: no cyanosis of digits and no clubbing  PSYCH: alert & oriented x 3, fluent speech NEURO: no focal motor/sensory deficits  LABORATORY DATA:  I have reviewed the data as listed CBC Latest Ref Rng & Units 04/02/2021 03/18/2021 03/05/2021  WBC 4.0 - 10.5 K/uL 3.2(L) 6.9 7.0  Hemoglobin 13.0 - 17.0 g/dL 11.5(L) 11.5(L) 11.2(L)  Hematocrit 39.0 - 52.0 % 35.0(L) 35.0(L) 34.3(L)  Platelets 150 - 400 K/uL 186 256 276  CMP Latest Ref Rng & Units 04/02/2021 03/18/2021 03/05/2021  Glucose 70 - 99 mg/dL 190(H) 159(H) 140(H)  BUN 8 - 23 mg/dL '15 16 18  '$ Creatinine 0.61 - 1.24 mg/dL 0.91 0.85 0.79  Sodium 135 - 145 mmol/L 140 140 139  Potassium 3.5 - 5.1 mmol/L 3.9 3.6 4.0  Chloride 98 - 111 mmol/L 106 105 106  CO2 22 - 32 mmol/L '23 24 25  '$ Calcium 8.9 - 10.3 mg/dL 9.2 9.3 9.1  Total Protein 6.5 - 8.1 g/dL 7.3 7.7 7.1  Total Bilirubin 0.3 - 1.2 mg/dL 0.3 0.3 0.2(L)  Alkaline Phos 38 - 126 U/L 94 88 82  AST 15 - 41 U/L 19 13(L) 14(L)  ALT 0 - 44 U/L '18 14 12    '$ RADIOGRAPHIC STUDIES: I have personally reviewed the radiological images as listed and agreed with the findings in the report: no evidence of metastatic diease in the chest. MRI abdomen does not show metastatic disease.   IR IMAGING GUIDED PORT INSERTION  Result Date: 03/04/2021 INDICATION: 65 year old male referred for port catheter EXAM: IMPLANTED PORT A CATH PLACEMENT WITH ULTRASOUND AND FLUOROSCOPIC GUIDANCE MEDICATIONS: None ANESTHESIA/SEDATION: Moderate (conscious) sedation was employed during this procedure. A total  of Versed 4.0 mg and Fentanyl 100 mcg was administered intravenously. Moderate Sedation Time: 20 minutes. The patient's level of consciousness and vital signs were monitored continuously by radiology nursing throughout the procedure under my direct supervision. FLUOROSCOPY TIME:  0 minutes, 12 seconds (4 mGy) COMPLICATIONS: None PROCEDURE: The procedure, risks, benefits, and alternatives were explained to the patient. Questions regarding the procedure were encouraged and answered. The patient understands and consents to the procedure. Ultrasound survey was performed with images stored and sent to PACs. The right neck and chest was prepped with chlorhexidine, and draped in the usual sterile fashion using maximum barrier technique (cap and mask, sterile gown, sterile gloves, large sterile sheet, hand hygiene and cutaneous antiseptic). Local anesthesia was attained by infiltration with 1% lidocaine without epinephrine. Ultrasound demonstrated patency of the right internal jugular vein, and this was documented with an image. Under real-time ultrasound guidance, this vein was accessed with a 21 gauge micropuncture needle and image documentation was performed. A small dermatotomy was made at the access site with an 11 scalpel. A 0.018" wire was advanced into the SVC and used to estimate the length of the internal catheter. The access needle exchanged for a 93F micropuncture vascular sheath. The 0.018" wire was then removed and a 0.035" wire advanced into the IVC. An appropriate location for the subcutaneous reservoir was selected below the clavicle and an incision was made through the skin and underlying soft tissues. The subcutaneous tissues were then dissected using a combination of blunt and sharp surgical technique and a pocket was formed. A single lumen power injectable portacatheter was then tunneled through the subcutaneous tissues from the pocket to the dermatotomy and the port reservoir placed within the  subcutaneous pocket. The venous access site was then serially dilated and a peel away vascular sheath placed over the wire. The wire was removed and the port catheter advanced into position under fluoroscopic guidance. The catheter tip is positioned in the cavoatrial junction. This was documented with a spot image. The portacatheter was then tested and found to flush and aspirate well. The port was flushed with saline followed by 100 units/mL heparinized saline. The pocket was then closed in two layers using first subdermal inverted interrupted absorbable sutures followed by a running subcuticular suture. The epidermis  was then sealed with Dermabond. The dermatotomy at the venous access site was also seal with Dermabond. Patient tolerated the procedure well and remained hemodynamically stable throughout. No complications encountered and no significant blood loss encountered IMPRESSION: Status post right IJ port catheter placement. Signed, Dulcy Fanny. Dellia Nims, RPVI [facility] Electronically Signed   By: Corrie Mckusick D.O.   On: 03/04/2021 15:33    ASSESSMENT & PLAN Curtis [name] 65 y.o. male with medical history significant for rectal adenocarcinoma who presents for a follow up visit.   At this time the patient's findings are most consistent with localized rectal adenocarcinoma.  MRI of the pelvis confirmed this is a T4 N2 M0 stage IIIc cancer.  As such we will plan to proceed with neoadjuvant chemotherapy, followed by chemoradiation, followed by reevaluation for consideration of transabdominal resection.  The initial treatment of choice would be neoadjuvant FOLFOX chemotherapy for 6-8 cycles.  Once this is complete we will transition to chemoradiation with p.o. capecitabine therapy.  When these are complete we will restage for consideration of surgical resection.  # Rectal Adenocarcinoma, T4N2M0 Stage IIIc --  staging with a CT scan of the chest  and MRI abdomen/pelvis findings are consistent with localized disease.   --CEA modestly elevated at 17.44 on 02/09/2021 prior to start of therapy. --Given that this is adenocarcinoma of the rectum I would recommend proceeding with neoadjuvant chemotherapy, followed by chemoradiation, followed by definitive surgery. Case discussed at [facility] --Patient has established care with Dr. Lisbeth Renshaw and radiation oncology. Plan: --today is Cycle 3 Day 1 of neoadjuvant FOLFOX  --assure CMP, CBC at each visit, and routine CEA levels --RTC in 2 weeks for Cycle 4  #Anemia --mild, today Hgb noted at 11.5 --likely secondary to active malignancy and modest iron deficiency. --continue to monitor  #Supportive Care -- chemotherapy education complete -- port placed -- zofran '8mg'$  q8H PRN and compazine '10mg'$  PO q6H for nausea -- EMLA cream for port -- no pain medication required at this time.   No orders of the defined types were placed in this encounter.  All questions were answered. The patient knows to call the clinic with any problems, questions or concerns.  A total of more than 30 minutes were spent on this encounter with face-to-face time and non-face-to-face time, including preparing to see the patient, ordering tests and/or medications, counseling the patient and coordination of care as outlined above.   Ledell Peoples, MD Department of Hematology/Oncology [place] at [facility] Phone: [phone] Pager: [phone] Email: [email or web address]  04/02/2021 10:59 AM

## 2021-04-02 NOTE — Patient Instructions (Addendum)
[facility]  Discharge Instructions: Thank you for choosing [facility] to provide your oncology and hematology care.   If you have a lab appointment with the [facility], please go directly to the [facility] and check in at the registration area.   Wear comfortable clothing and clothing appropriate for easy access to any Portacath or PICC line.   We strive to give you quality time with your provider. You may need to reschedule your appointment if you arrive late (15 or more minutes).  Arriving late affects you and other patients whose appointments are after yours.  Also, if you miss three or more appointments without notifying the office, you may be dismissed from the clinic at the provider's discretion.      For prescription refill requests, have your pharmacy contact our office and allow 72 hours for refills to be completed.    Today you received the following chemotherapy and/or immunotherapy agents: Oxaliplatin, Leucovorin, and Fluorouracil.     To help prevent nausea and vomiting after your treatment, we encourage you to take your nausea medication as directed.  BELOW ARE SYMPTOMS THAT SHOULD BE REPORTED IMMEDIATELY: *FEVER GREATER THAN 100.4 F (38 C) OR HIGHER *CHILLS OR SWEATING *NAUSEA AND VOMITING THAT IS NOT CONTROLLED WITH YOUR NAUSEA MEDICATION *UNUSUAL SHORTNESS OF BREATH *UNUSUAL BRUISING OR BLEEDING *URINARY PROBLEMS (pain or burning when urinating, or frequent urination) *BOWEL PROBLEMS (unusual diarrhea, constipation, pain near the anus) TENDERNESS IN MOUTH AND THROAT WITH OR WITHOUT PRESENCE OF ULCERS (sore throat, sores in mouth, or a toothache) UNUSUAL RASH, SWELLING OR PAIN  UNUSUAL VAGINAL DISCHARGE OR ITCHING   Items with * indicate a potential emergency and should be followed up as soon as possible or go to the Emergency Department if any problems should occur.  Please show the CHEMOTHERAPY ALERT CARD or  IMMUNOTHERAPY ALERT CARD at check-in to the Emergency Department and triage nurse.  Should you have questions after your visit or need to cancel or reschedule your appointment, please contact [facility]  Dept: [phone]  and follow the prompts.  Office hours are 8:00 a.m. to 4:30 p.m. Monday - Friday. Please note that voicemails left after 4:00 p.m. may not be returned until the following business day.  We are closed weekends and major holidays. You have access to a nurse at all times for urgent questions. Please call the main number to the clinic Dept: [phone] and follow the prompts.   For any non-urgent questions, you may also contact your provider using MyChart. We now offer e-Visits for anyone 65 and older to request care online for non-urgent symptoms. For details visit [email or web address].   Also download the MyChart app! Go to the app store, search "MyChart", open the app, select [facility], and log in with your MyChart username and password.  Due to Covid, a mask is required upon entering the hospital/clinic. If you do not have a mask, one will be given to you upon arrival. For doctor visits, patients may have 1 support person aged 56 or older with them. For treatment visits, patients cannot have anyone with them due to current Covid guidelines and our immunocompromised population.   The chemotherapy medication bag should finish at 46 hours, 96 hours, or 7 days. For example, if your pump is scheduled for 46 hours and it was put on at 4:00 p.m., it should finish at 2:00 p.m. the day it is scheduled to come off regardless  of your appointment time.     Estimated time to finish 1:00 PM   If the display on your pump reads "Low Volume" and it is beeping, take the batteries out of the pump and come to the cancer center for it to be taken off.   If the pump alarms go off prior to the pump reading "Low Volume" then call [phone] and someone can assist  you.  If the plunger comes out and the chemotherapy medication is leaking out, please use your home chemo spill kit to clean up the spill. Do NOT use paper towels or other household products.  If you have problems or questions regarding your pump, please call either [phone] (24 hours a day) or the cancer center Monday-Friday 8:00 a.m.- 4:30 p.m. at the clinic number and we will assist you. If you are unable to get assistance, then go to the nearest Emergency Department and ask the staff to contact the IV team for assistance.

## 2021-04-04 ENCOUNTER — Inpatient Hospital Stay: Payer: Self-pay

## 2021-04-04 ENCOUNTER — Other Ambulatory Visit: Payer: Self-pay

## 2021-04-04 VITALS — BP 193/91 | HR 86 | Temp 97.7°F | Resp 20

## 2021-04-04 DIAGNOSIS — Z95828 Presence of other vascular implants and grafts: Secondary | ICD-10-CM

## 2021-04-04 MED ORDER — SODIUM CHLORIDE 0.9% FLUSH
10.0000 mL | Freq: Once | INTRAVENOUS | Status: AC
Start: 2021-04-04 — End: 2021-04-04
  Administered 2021-04-04: 10 mL

## 2021-04-04 MED ORDER — HEPARIN SOD (PORK) LOCK FLUSH 100 UNIT/ML IV SOLN
500.0000 [IU] | Freq: Once | INTRAVENOUS | Status: AC
  Administered 2021-04-04: 500 [IU]

## 2021-04-15 ENCOUNTER — Inpatient Hospital Stay (HOSPITAL_BASED_OUTPATIENT_CLINIC_OR_DEPARTMENT_OTHER): Payer: Self-pay | Admitting: Hematology and Oncology

## 2021-04-15 ENCOUNTER — Encounter: Payer: Self-pay | Admitting: Hematology and Oncology

## 2021-04-15 ENCOUNTER — Other Ambulatory Visit: Payer: Self-pay

## 2021-04-15 ENCOUNTER — Inpatient Hospital Stay: Payer: Self-pay

## 2021-04-15 VITALS — BP 169/79 | HR 61 | Temp 96.0°F | Resp 18 | Wt 196.0 lb

## 2021-04-15 DIAGNOSIS — C20 Malignant neoplasm of rectum: Secondary | ICD-10-CM

## 2021-04-15 DIAGNOSIS — Z95828 Presence of other vascular implants and grafts: Secondary | ICD-10-CM

## 2021-04-15 LAB — CBC WITH DIFFERENTIAL (CANCER CENTER ONLY)
Abs Immature Granulocytes: 0.01 10*3/uL (ref 0.00–0.07)
Basophils Absolute: 0 10*3/uL (ref 0.0–0.1)
Basophils Relative: 1 %
Eosinophils Absolute: 0.2 10*3/uL (ref 0.0–0.5)
Eosinophils Relative: 4 %
HCT: 34.7 % — ABNORMAL LOW (ref 39.0–52.0)
Hemoglobin: 11.5 g/dL — ABNORMAL LOW (ref 13.0–17.0)
Immature Granulocytes: 0 %
Lymphocytes Relative: 27 %
Lymphs Abs: 1.1 10*3/uL (ref 0.7–4.0)
MCH: 27.1 pg (ref 26.0–34.0)
MCHC: 33.1 g/dL (ref 30.0–36.0)
MCV: 81.8 fL (ref 80.0–100.0)
Monocytes Absolute: 0.4 10*3/uL (ref 0.1–1.0)
Monocytes Relative: 9 %
Neutro Abs: 2.4 10*3/uL (ref 1.7–7.7)
Neutrophils Relative %: 59 %
Platelet Count: 117 10*3/uL — ABNORMAL LOW (ref 150–400)
RBC: 4.24 MIL/uL (ref 4.22–5.81)
RDW: 16.9 % — ABNORMAL HIGH (ref 11.5–15.5)
WBC Count: 4.1 10*3/uL (ref 4.0–10.5)
nRBC: 0 % (ref 0.0–0.2)

## 2021-04-15 LAB — CMP (CANCER CENTER ONLY)
ALT: 24 U/L (ref 0–44)
AST: 21 U/L (ref 15–41)
Albumin: 3.6 g/dL (ref 3.5–5.0)
Alkaline Phosphatase: 96 U/L (ref 38–126)
Anion gap: 7 (ref 5–15)
BUN: 10 mg/dL (ref 8–23)
CO2: 25 mmol/L (ref 22–32)
Calcium: 9.2 mg/dL (ref 8.9–10.3)
Chloride: 106 mmol/L (ref 98–111)
Creatinine: 0.9 mg/dL (ref 0.61–1.24)
GFR, Estimated: >60 mL/min (ref >60)
Glucose, Bld: 247 mg/dL — ABNORMAL HIGH (ref 70–99)
Potassium: 3.6 mmol/L (ref 3.5–5.1)
Sodium: 138 mmol/L (ref 135–145)
Total Bilirubin: 0.3 mg/dL (ref 0.3–1.2)
Total Protein: 7.1 g/dL (ref 6.5–8.1)

## 2021-04-15 MED ORDER — SODIUM CHLORIDE 0.9 % IV SOLN
2400.0000 mg/m2 | INTRAVENOUS | Status: DC
  Administered 2021-04-15: 5000 mg via INTRAVENOUS
  Filled 2021-04-15: qty 100

## 2021-04-15 MED ORDER — FLUOROURACIL CHEMO INJECTION 2.5 GM/50ML
400.0000 mg/m2 | Freq: Once | INTRAVENOUS | Status: AC
  Administered 2021-04-15: 850 mg via INTRAVENOUS
  Filled 2021-04-15: qty 17

## 2021-04-15 MED ORDER — SODIUM CHLORIDE 0.9% FLUSH
10.0000 mL | Freq: Once | INTRAVENOUS | Status: AC
  Administered 2021-04-15: 10 mL

## 2021-04-15 MED ORDER — PALONOSETRON HCL INJECTION 0.25 MG/5ML
0.2500 mg | Freq: Once | INTRAVENOUS | Status: AC
  Administered 2021-04-15: 0.25 mg via INTRAVENOUS
  Filled 2021-04-15: qty 5

## 2021-04-15 MED ORDER — SODIUM CHLORIDE 0.9% FLUSH
10.0000 mL | INTRAVENOUS | Status: DC | PRN
  Administered 2021-04-15: 10 mL

## 2021-04-15 MED ORDER — LEUCOVORIN CALCIUM INJECTION 350 MG
400.0000 mg/m2 | Freq: Once | INTRAVENOUS | Status: AC
  Administered 2021-04-15: 836 mg via INTRAVENOUS
  Filled 2021-04-15: qty 41.8

## 2021-04-15 MED ORDER — SODIUM CHLORIDE 0.9 % IV SOLN
10.0000 mg | Freq: Once | INTRAVENOUS | Status: AC
  Administered 2021-04-15: 10 mg via INTRAVENOUS
  Filled 2021-04-15: qty 10

## 2021-04-15 MED ORDER — OXALIPLATIN CHEMO INJECTION 100 MG/20ML
85.0000 mg/m2 | Freq: Once | INTRAVENOUS | Status: AC
  Administered 2021-04-15: 180 mg via INTRAVENOUS
  Filled 2021-04-15: qty 36

## 2021-04-15 MED ORDER — DEXTROSE 5 % IV SOLN: Freq: Once | INTRAVENOUS | Status: AC

## 2021-04-15 NOTE — Progress Notes (Signed)
[place] [phone]   [phone]  PROGRESS NOTE  Patient Care Team: Pcp, No as PCP - General Orson Slick, MD as Consulting Physician (Medical Oncology) Royston Bake, RN as Oncology Nurse Navigator (Oncology)  Hematological/Oncological History # Rectal Adenocarcinoma, T4N2M0. Stage IIIc 01/16/2021: presented to the emergency department with abdominal pain. CT abdomen/pelvis showed concern for mass lesion with perforation and surrounding adenopathy. 01/19/2021: repeat CT scan showed worsening inflammatory changes around the complex rectal process 01/21/2021: flexible sigmoidoscopy performed, lesion at 12 cm from the anal verge.  biopsy showed concern for adenocarcinoma. 01/21/2021: patient underwent a laparoscopic assisted loop colostomy with Dr. Georgette Dover. 02/09/2021: establish care with Dr. Lorenso Courier  03/05/2021: Cycle 1 Day 1 of neoadjuvant FOLFOX 03/18/2021: Cycle 2 Day 1 of neoadjuvant FOLFOX 04/02/2021: Cycle 3 Day 1 of neoadjuvant FOLFOX  Interval History:  Curtis [name] 65 y.o. male with medical history significant for rectal adenocarcinoma who presents for a follow up visit. The patient's last visit was on 03/18/2021. In the interim since the last visit he has completed Cycle 3 of neoadjuvant FOLFOX.   On exam today Curtis [name] reports he tolerated his first 3 cycles of chemotherapy very well.  He reports that with his last treatment he did have cold sensitivity that lasted considerably longer than his prior cycles.  He notes that he "feels great".  He has good and he has gained 2 pounds in the interim since his last cycle.  He notes he is doing his best to eat a healthy diet.  His ostomy output has been semisolid with no profuse liquid stools.Marland Kitchen  He is willing and able to proceed with treatment at this time.  He otherwise denies any fevers, chills, sweats, nausea, vomiting or diarrhea.  A full 10 point ROS is listed below.  MEDICAL HISTORY:  Past Medical  History:  Diagnosis Date   Rectosigmoid cancer ([place])     SURGICAL HISTORY: Past Surgical History:  Procedure Laterality Date   BIOPSY  01/21/2021   Procedure: BIOPSY;  Surgeon: Mauri Pole, MD;  Location: [facility];  Service: Endoscopy;;   COLON RESECTION N/A 01/21/2021   Procedure: LAPAROSCOPIC ASSISTED LOOP COLOSTOMY;  Surgeon: Donnie Mesa, MD;  Location: [facility];  Service: General;  Laterality: N/A;   FLEXIBLE SIGMOIDOSCOPY N/A 01/21/2021   Procedure: FLEXIBLE SIGMOIDOSCOPY;  Surgeon: Mauri Pole, MD;  Location: [facility];  Service: Endoscopy;  Laterality: N/A;   IR IMAGING GUIDED PORT INSERTION  03/04/2021   SUBMUCOSAL TATTOO INJECTION  01/21/2021   Procedure: SUBMUCOSAL TATTOO INJECTION;  Surgeon: Mauri Pole, MD;  Location: [facility];  Service: Endoscopy;;    SOCIAL HISTORY: Social History   Socioeconomic History   Marital status: Divorced    Spouse name: Not on file   Number of children: Not on file   Years of education: Not on file   Highest education level: Not on file  Occupational History   Not on file  Tobacco Use   Smoking status: Every Day    Types: Cigarettes   Smokeless tobacco: Never  Vaping Use   Vaping Use: Never used  Substance and Sexual Activity   Alcohol use: Never   Drug use: Never   Sexual activity: Never  Other Topics Concern   Not on file  Social History Narrative   Not on file   Social Determinants of Health   Financial Resource Strain: Low Risk    Difficulty of Paying Living Expenses: Not very hard  Food Insecurity: No Food Insecurity   Worried About Charity fundraiser in the Last Year: Never true   Ran Out of Food in the Last Year: Never true  Transportation Needs: No Transportation Needs   Lack of Transportation (Medical): No   Lack of Transportation (Non-Medical): No  Physical Activity: Not on file  Stress: Not on file  Social Connections: Not on file  Intimate Partner Violence: Not At Risk   Fear  of Current or Ex-Partner: No   Emotionally Abused: No   Physically Abused: No   Sexually Abused: No    FAMILY HISTORY: Family History  Problem Relation Age of Onset   Hypertension Mother    Hypertension Father    Breast cancer Sister     ALLERGIES:  has No Known Allergies.  MEDICATIONS:  Current Outpatient Medications  Medication Sig Dispense Refill   acetaminophen (TYLENOL) 500 MG tablet Take 1,000 mg by mouth daily.     lidocaine-prilocaine (EMLA) cream Apply 1 application topically as needed. 30 g 0   ondansetron (ZOFRAN) 8 MG tablet Take 1 tablet (8 mg total) by mouth every 8 (eight) hours as needed. 30 tablet 0   prochlorperazine (COMPAZINE) 10 MG tablet Take 1 tablet (10 mg total) by mouth every 6 (six) hours as needed for nausea or vomiting. 30 tablet 0   No current facility-administered medications for this visit.   Facility-Administered Medications Ordered in Other Visits  Medication Dose Route Frequency Provider Last Rate Last Admin   fluorouracil (ADRUCIL) 5,000 mg in sodium chloride 0.9 % 150 mL chemo infusion  2,400 mg/m2 (Treatment Plan Recorded) Intravenous 1 day or 1 dose Orson Slick, MD       fluorouracil (ADRUCIL) chemo injection 850 mg  400 mg/m2 (Treatment Plan Recorded) Intravenous Once Orson Slick, MD       leucovorin 836 mg in dextrose 5 % 250 mL infusion  400 mg/m2 (Treatment Plan Recorded) Intravenous Once Orson Slick, MD 146 mL/hr at 04/15/21 1311 836 mg at 04/15/21 1311   oxaliplatin (ELOXATIN) 180 mg in dextrose 5 % 500 mL chemo infusion  85 mg/m2 (Treatment Plan Recorded) Intravenous Once Orson Slick, MD 268 mL/hr at 04/15/21 1308 180 mg at 04/15/21 1308   sodium chloride flush (NS) 0.9 % injection 10 mL  10 mL Intracatheter PRN Orson Slick, MD        REVIEW OF SYSTEMS:   Constitutional: ( - ) fevers, ( - )  chills , ( - ) night sweats Eyes: ( - ) blurriness of vision, ( - ) double vision, ( - ) watery eyes Ears, nose,  mouth, throat, and face: ( - ) mucositis, ( - ) sore throat Respiratory: ( - ) cough, ( - ) dyspnea, ( - ) wheezes Cardiovascular: ( - ) palpitation, ( - ) chest discomfort, ( - ) lower extremity swelling Gastrointestinal:  ( - ) nausea, ( - ) heartburn, ( - ) change in bowel habits Skin: ( - ) abnormal skin rashes Lymphatics: ( - ) new lymphadenopathy, ( - ) easy bruising Neurological: ( - ) numbness, ( - ) tingling, ( - ) new weaknesses Behavioral/Psych: ( - ) mood change, ( - ) new changes  All other systems were reviewed with the patient and are negative.  PHYSICAL EXAMINATION: ECOG PERFORMANCE STATUS: 1 - Symptomatic but completely ambulatory  Vitals:   04/15/21 1041  BP: (!) 169/79  Pulse: 61  Resp: 18  Temp: (!) 96 F (35.6 C)  SpO2: 100%    Filed Weights   04/15/21 1041  Weight: 196 lb (88.9 kg)     GENERAL: Well-appearing middle-aged [name], alert, no distress and comfortable SKIN: skin color, texture, turgor are normal, no rashes or significant lesions EYES: conjunctiva are pink and non-injected, sclera clear LUNGS: clear to auscultation and percussion with normal breathing effort HEART: regular rate & rhythm and no murmurs and no lower extremity edema ABDOMEN: soft, non-tender, non-distended, normal bowel sounds.  Ostomy clean dry and intact  Musculoskeletal: no cyanosis of digits and no clubbing  PSYCH: alert & oriented x 3, fluent speech NEURO: no focal motor/sensory deficits  LABORATORY DATA:  I have reviewed the data as listed CBC Latest Ref Rng & Units 04/15/2021 04/02/2021 03/18/2021  WBC 4.0 - 10.5 K/uL 4.1 3.2(L) 6.9  Hemoglobin 13.0 - 17.0 g/dL 11.5(L) 11.5(L) 11.5(L)  Hematocrit 39.0 - 52.0 % 34.7(L) 35.0(L) 35.0(L)  Platelets 150 - 400 K/uL 117(L) 186 256    CMP Latest Ref Rng & Units 04/15/2021 04/02/2021 03/18/2021  Glucose 70 - 99 mg/dL 247(H) 190(H) 159(H)  BUN 8 - 23 mg/dL 10 15 16   Creatinine 0.61 - 1.24 mg/dL 0.90 0.91 0.85  Sodium 135 -  145 mmol/L 138 140 140  Potassium 3.5 - 5.1 mmol/L 3.6 3.9 3.6  Chloride 98 - 111 mmol/L 106 106 105  CO2 22 - 32 mmol/L 25 23 24   Calcium 8.9 - 10.3 mg/dL 9.2 9.2 9.3  Total Protein 6.5 - 8.1 g/dL 7.1 7.3 7.7  Total Bilirubin 0.3 - 1.2 mg/dL 0.3 0.3 0.3  Alkaline Phos 38 - 126 U/L 96 94 88  AST 15 - 41 U/L 21 19 13(L)  ALT 0 - 44 U/L 24 18 14     RADIOGRAPHIC STUDIES: I have personally reviewed the radiological images as listed and agreed with the findings in the report: no evidence of metastatic diease in the chest. MRI abdomen does not show metastatic disease.   No results found.  ASSESSMENT & PLAN Curtis [name] 65 y.o. male with medical history significant for rectal adenocarcinoma who presents for a follow up visit.   At this time the patient's findings are most consistent with localized rectal adenocarcinoma.  MRI of the pelvis confirmed this is a T4 N2 M0 stage IIIc cancer.  As such we will plan to proceed with neoadjuvant chemotherapy, followed by chemoradiation, followed by reevaluation for consideration of transabdominal resection.  The initial treatment of choice would be neoadjuvant FOLFOX chemotherapy for 6-8 cycles.  Once this is complete we will transition to chemoradiation with p.o. capecitabine therapy.  When these are complete we will restage for consideration of surgical resection.  # Rectal Adenocarcinoma, T4N2M0 Stage IIIc --  staging with a CT scan of the chest and MRI abdomen/pelvis findings are consistent with localized disease.   --CEA modestly elevated at 17.44 on 02/09/2021 prior to start of therapy. --Given that this is adenocarcinoma of the rectum I would recommend proceeding with neoadjuvant chemotherapy, followed by chemoradiation, followed by definitive surgery. Case discussed at [facility] --Patient has established care with Dr. Lisbeth Renshaw and radiation oncology. Plan: --today is Cycle 3 Day 1 of neoadjuvant FOLFOX  --assure CMP, CBC at each visit, and routine CEA  levels --RTC in 2 weeks for Cycle 4  #Anemia #Thrombocyotpenia --mild, today Hgb noted at 11.5 --Plt drop to 117 today from 186 at prior cycle. --likely secondary to active malignancy and modest iron deficiency. Some contribution from  chemotherapy as well.  --continue to monitor  #Supportive Care -- chemotherapy education complete -- port placed -- zofran 8mg  q8H PRN and compazine 10mg  PO q6H for nausea -- EMLA cream for port -- no pain medication required at this time.   No orders of the defined types were placed in this encounter.  All questions were answered. The patient knows to call the clinic with any problems, questions or concerns.  A total of more than 30 minutes were spent on this encounter with face-to-face time and non-face-to-face time, including preparing to see the patient, ordering tests and/or medications, counseling the patient and coordination of care as outlined above.   Ledell Peoples, MD Department of Hematology/Oncology [place] at [facility] Phone: [phone] Pager: [phone] Email: [email or web address]  04/15/2021 1:24 PM

## 2021-04-15 NOTE — Patient Instructions (Signed)
[facility] MEDICAL [facility]COLOGY  Discharge Instructions: ?Thank you for choosing [facility] to provide your oncology and hematology care.  ? ?If you have a lab appointment with the Cancer Center, please go directly to the Cancer Center and check in at the registration area. ?  ?Wear comfortable clothing and clothing appropriate for easy access to any Portacath or PICC line.  ? ?We strive to give you quality time with your provider. You may need to reschedule your appointment if you arrive late (15 or more minutes).  Arriving late affects you and other patients whose appointments are after yours.  Also, if you miss three or more appointments without notifying the office, you may be dismissed from the clinic at the provider?s discretion.    ?  ?For prescription refill requests, have your pharmacy contact our office and allow 72 hours for refills to be completed.   ? ?Today you received the following chemotherapy and/or immunotherapy agents Oxaliplatin, Leucovorin, 5FU    ?  ?To help prevent nausea and vomiting after your treatment, we encourage you to take your nausea medication as directed. ? ?BELOW ARE SYMPTOMS THAT SHOULD BE REPORTED IMMEDIATELY: ?*FEVER GREATER THAN 100.4 F (38 ?C) OR HIGHER ?*CHILLS OR SWEATING ?*NAUSEA AND VOMITING THAT IS NOT CONTROLLED WITH YOUR NAUSEA MEDICATION ?*UNUSUAL SHORTNESS OF BREATH ?*UNUSUAL BRUISING OR BLEEDING ?*URINARY PROBLEMS (pain or burning when urinating, or frequent urination) ?*BOWEL PROBLEMS (unusual diarrhea, constipation, pain near the anus) ?TENDERNESS IN MOUTH AND THROAT WITH OR WITHOUT PRESENCE OF ULCERS (sore throat, sores in mouth, or a toothache) ?UNUSUAL RASH, SWELLING OR PAIN  ?UNUSUAL VAGINAL DISCHARGE OR ITCHING  ? ?Items with * indicate a potential emergency and should be followed up as soon as possible or go to the Emergency Department if any problems should occur. ? ?Please show the CHEMOTHERAPY ALERT CARD or IMMUNOTHERAPY ALERT  CARD at check-in to the Emergency Department and triage nurse. ? ?Should you have questions after your visit or need to cancel or reschedule your appointment, please contact [facility]  Dept: [phone]  and follow the prompts.  Office hours are 8:00 a.m. to 4:30 p.m. Monday - Friday. Please note that voicemails left after 4:00 p.m. may not be returned until the following business day.  We are closed weekends and major holidays. You have access to a nurse at all times for urgent questions. Please call the main number to the clinic Dept: [phone] and follow the prompts. ? ? ?For any non-urgent questions, you may also contact your provider using MyChart. We now offer e-Visits for anyone 18 and older to request care online for non-urgent symptoms. For details visit [email or web address]. ?  ?Also download the MyChart app! Go to the app store, search "MyChart", open the app, select [facility], and log in with your MyChart username and password. ? ?Due to Covid, a mask is required upon entering the hospital/clinic. If you do not have a mask, one will be given to you upon arrival. For doctor visits, patients may have 1 support person aged 18 or older with them. For treatment visits, patients cannot have anyone with them due to current Covid guidelines and our immunocompromised population.  ? ?

## 2021-04-17 ENCOUNTER — Inpatient Hospital Stay: Payer: Self-pay

## 2021-04-17 ENCOUNTER — Other Ambulatory Visit: Payer: Self-pay

## 2021-04-17 VITALS — BP 131/74 | HR 99 | Temp 98.8°F | Resp 20

## 2021-04-17 DIAGNOSIS — C20 Malignant neoplasm of rectum: Secondary | ICD-10-CM

## 2021-04-17 MED ORDER — HEPARIN SOD (PORK) LOCK FLUSH 100 UNIT/ML IV SOLN
500.0000 [IU] | Freq: Once | INTRAVENOUS | Status: AC | PRN
  Administered 2021-04-17: 500 [IU]

## 2021-04-17 MED ORDER — SODIUM CHLORIDE 0.9% FLUSH
10.0000 mL | INTRAVENOUS | Status: DC | PRN
Start: 2021-04-17 — End: 2021-04-17
  Administered 2021-04-17: 10 mL

## 2021-04-30 ENCOUNTER — Inpatient Hospital Stay: Payer: Self-pay | Attending: Hematology and Oncology

## 2021-04-30 ENCOUNTER — Other Ambulatory Visit: Payer: Self-pay

## 2021-04-30 ENCOUNTER — Inpatient Hospital Stay (HOSPITAL_BASED_OUTPATIENT_CLINIC_OR_DEPARTMENT_OTHER): Payer: Self-pay | Admitting: Hematology and Oncology

## 2021-04-30 ENCOUNTER — Inpatient Hospital Stay: Payer: Self-pay

## 2021-04-30 VITALS — BP 159/83 | HR 84 | Temp 97.3°F | Resp 18 | Wt 192.4 lb

## 2021-04-30 DIAGNOSIS — F17210 Nicotine dependence, cigarettes, uncomplicated: Secondary | ICD-10-CM | POA: Insufficient documentation

## 2021-04-30 DIAGNOSIS — Z803 Family history of malignant neoplasm of breast: Secondary | ICD-10-CM | POA: Insufficient documentation

## 2021-04-30 DIAGNOSIS — C20 Malignant neoplasm of rectum: Secondary | ICD-10-CM | POA: Insufficient documentation

## 2021-04-30 DIAGNOSIS — D696 Thrombocytopenia, unspecified: Secondary | ICD-10-CM | POA: Insufficient documentation

## 2021-04-30 DIAGNOSIS — Z79899 Other long term (current) drug therapy: Secondary | ICD-10-CM | POA: Insufficient documentation

## 2021-04-30 DIAGNOSIS — Z8249 Family history of ischemic heart disease and other diseases of the circulatory system: Secondary | ICD-10-CM | POA: Insufficient documentation

## 2021-04-30 DIAGNOSIS — Z95828 Presence of other vascular implants and grafts: Secondary | ICD-10-CM

## 2021-04-30 DIAGNOSIS — D649 Anemia, unspecified: Secondary | ICD-10-CM | POA: Insufficient documentation

## 2021-04-30 DIAGNOSIS — Z5111 Encounter for antineoplastic chemotherapy: Secondary | ICD-10-CM | POA: Insufficient documentation

## 2021-04-30 LAB — CBC WITH DIFFERENTIAL (CANCER CENTER ONLY)
Abs Immature Granulocytes: 0 10*3/uL (ref 0.00–0.07)
Basophils Absolute: 0 10*3/uL (ref 0.0–0.1)
Basophils Relative: 1 %
Eosinophils Absolute: 0.1 10*3/uL (ref 0.0–0.5)
Eosinophils Relative: 2 %
HCT: 34.1 % — ABNORMAL LOW (ref 39.0–52.0)
Hemoglobin: 11.7 g/dL — ABNORMAL LOW (ref 13.0–17.0)
Immature Granulocytes: 0 %
Lymphocytes Relative: 30 %
Lymphs Abs: 1.2 10*3/uL (ref 0.7–4.0)
MCH: 27.9 pg (ref 26.0–34.0)
MCHC: 34.3 g/dL (ref 30.0–36.0)
MCV: 81.4 fL (ref 80.0–100.0)
Monocytes Absolute: 0.4 10*3/uL (ref 0.1–1.0)
Monocytes Relative: 10 %
Neutro Abs: 2.3 10*3/uL (ref 1.7–7.7)
Neutrophils Relative %: 57 %
Platelet Count: 113 10*3/uL — ABNORMAL LOW (ref 150–400)
RBC: 4.19 MIL/uL — ABNORMAL LOW (ref 4.22–5.81)
RDW: 17.4 % — ABNORMAL HIGH (ref 11.5–15.5)
WBC Count: 4 10*3/uL (ref 4.0–10.5)
nRBC: 0 % (ref 0.0–0.2)

## 2021-04-30 LAB — CMP (CANCER CENTER ONLY)
ALT: 17 U/L (ref 0–44)
AST: 17 U/L (ref 15–41)
Albumin: 3.4 g/dL — ABNORMAL LOW (ref 3.5–5.0)
Alkaline Phosphatase: 116 U/L (ref 38–126)
Anion gap: 10 (ref 5–15)
BUN: 8 mg/dL (ref 8–23)
CO2: 23 mmol/L (ref 22–32)
Calcium: 9.2 mg/dL (ref 8.9–10.3)
Chloride: 102 mmol/L (ref 98–111)
Creatinine: 1.05 mg/dL (ref 0.61–1.24)
GFR, Estimated: >60 mL/min (ref >60)
Glucose, Bld: 496 mg/dL — ABNORMAL HIGH (ref 70–99)
Potassium: 3.3 mmol/L — ABNORMAL LOW (ref 3.5–5.1)
Sodium: 135 mmol/L (ref 135–145)
Total Bilirubin: 0.3 mg/dL (ref 0.3–1.2)
Total Protein: 6.8 g/dL (ref 6.5–8.1)

## 2021-04-30 MED ORDER — DEXTROSE 5 % IV SOLN
400.0000 mg/m2 | Freq: Once | INTRAVENOUS | Status: AC
  Administered 2021-04-30: 836 mg via INTRAVENOUS
  Filled 2021-04-30: qty 41.8

## 2021-04-30 MED ORDER — SODIUM CHLORIDE 0.9 % IV SOLN
2400.0000 mg/m2 | INTRAVENOUS | Status: DC
  Administered 2021-04-30: 5000 mg via INTRAVENOUS
  Filled 2021-04-30: qty 100

## 2021-04-30 MED ORDER — PALONOSETRON HCL INJECTION 0.25 MG/5ML
0.2500 mg | Freq: Once | INTRAVENOUS | Status: AC
  Administered 2021-04-30: 0.25 mg via INTRAVENOUS
  Filled 2021-04-30: qty 5

## 2021-04-30 MED ORDER — SODIUM CHLORIDE 0.9% FLUSH
10.0000 mL | Freq: Once | INTRAVENOUS | Status: AC
  Administered 2021-04-30: 10 mL

## 2021-04-30 MED ORDER — FLUOROURACIL CHEMO INJECTION 2.5 GM/50ML
400.0000 mg/m2 | Freq: Once | INTRAVENOUS | Status: AC
  Administered 2021-04-30: 850 mg via INTRAVENOUS
  Filled 2021-04-30: qty 17

## 2021-04-30 MED ORDER — OXALIPLATIN CHEMO INJECTION 100 MG/20ML
85.0000 mg/m2 | Freq: Once | INTRAVENOUS | Status: AC
  Administered 2021-04-30: 180 mg via INTRAVENOUS
  Filled 2021-04-30: qty 36

## 2021-04-30 MED ORDER — DEXTROSE 5 % IV SOLN: Freq: Once | INTRAVENOUS | Status: AC

## 2021-04-30 MED ORDER — SODIUM CHLORIDE 0.9 % IV SOLN
10.0000 mg | Freq: Once | INTRAVENOUS | Status: AC
  Administered 2021-04-30: 10 mg via INTRAVENOUS
  Filled 2021-04-30: qty 10

## 2021-04-30 NOTE — Progress Notes (Signed)
[place] Telephone:([phone]) [phone]   Fax:(336) [phone]  PROGRESS NOTE  Patient Care Team: Pcp, No as PCP - General Orson Slick, MD as Consulting Physician (Medical Oncology) Royston Bake, RN as Oncology Nurse Navigator (Oncology)  Hematological/Oncological History # Rectal Adenocarcinoma, T4N2M0. Stage IIIc 01/16/2021: presented to the emergency department with abdominal pain. CT abdomen/pelvis showed concern for mass lesion with perforation and surrounding adenopathy. 01/19/2021: repeat CT scan showed worsening inflammatory changes around the complex rectal process 01/21/2021: flexible sigmoidoscopy performed, lesion at 12 cm from the anal verge.  biopsy showed concern for adenocarcinoma. 01/21/2021: patient underwent a laparoscopic assisted loop colostomy with Dr. Georgette Dover. 02/09/2021: establish care with Dr. Lorenso Courier  03/05/2021: Cycle 1 Day 1 of neoadjuvant FOLFOX 03/18/2021: Cycle 2 Day 1 of neoadjuvant FOLFOX 04/02/2021: Cycle 3 Day 1 of neoadjuvant FOLFOX 04/15/2021: Cycle 4 Day 1 of neoadjuvant FOLFOX 04/30/2021: Cycle 5 Day 1 of neoadjuvant FOLFOX  Interval History:  Curtis [name] 65 y.o. male with medical history significant for rectal adenocarcinoma who presents for a follow up visit. The patient's last visit was on 04/02/2021. In the interim since the last visit he has completed Cycle 4 of neoadjuvant FOLFOX.   On exam today Curtis [name] reports he tolerated his first 4 cycles of chemotherapy very well.  He reports he has been well in the interim since our last visit.  He notes he has been having issues with cold sensitivity and when it wears off he tends to eat cold things like ice cream.  He also has a he has been urinating a lot lately, but has increased his p.o. intake of fluids.  He notes that there is no odor or pain with his urination.  He notes that he is also not having any issues with pain or headache.  He does endorse being bored and unfortunately has lost 4  pounds in the interim since our last visit.  He is willing and able to proceed with treatment at this time.  He otherwise denies any fevers, chills, sweats, nausea, vomiting or diarrhea.  A full 10 point ROS is listed below.  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[place])     SURGICAL HISTORY: Past Surgical History:  Procedure Laterality Date   BIOPSY  01/21/2021   Procedure: BIOPSY;  Surgeon: Mauri Pole, MD;  Location: [facility];  Service: Endoscopy;;   COLON RESECTION N/A 01/21/2021   Procedure: LAPAROSCOPIC ASSISTED LOOP COLOSTOMY;  Surgeon: Donnie Mesa, MD;  Location: [facility];  Service: General;  Laterality: N/A;   FLEXIBLE SIGMOIDOSCOPY N/A 01/21/2021   Procedure: FLEXIBLE SIGMOIDOSCOPY;  Surgeon: Mauri Pole, MD;  Location: [facility];  Service: Endoscopy;  Laterality: N/A;   IR IMAGING GUIDED PORT INSERTION  03/04/2021   SUBMUCOSAL TATTOO INJECTION  01/21/2021   Procedure: SUBMUCOSAL TATTOO INJECTION;  Surgeon: Mauri Pole, MD;  Location: [facility];  Service: Endoscopy;;    SOCIAL HISTORY: Social History   Socioeconomic History   Marital status: Divorced    Spouse name: Not on file   Number of children: Not on file   Years of education: Not on file   Highest education level: Not on file  Occupational History   Not on file  Tobacco Use   Smoking status: Every Day    Types: Cigarettes   Smokeless tobacco: Never  Vaping Use   Vaping Use: Never used  Substance and Sexual Activity   Alcohol use: Never  Drug use: Never   Sexual activity: Never  Other Topics Concern   Not on file  Social History Narrative   Not on file   Social Determinants of Health   Financial Resource Strain: Low Risk    Difficulty of Paying Living Expenses: Not very hard  Food Insecurity: No Food Insecurity   Worried About Running Out of Food in the Last Year: Never true   Ran Out of Food in the Last Year: Never true   Transportation Needs: No Transportation Needs   Lack of Transportation (Medical): No   Lack of Transportation (Non-Medical): No  Physical Activity: Not on file  Stress: Not on file  Social Connections: Not on file  Intimate Partner Violence: Not At Risk   Fear of Current or Ex-Partner: No   Emotionally Abused: No   Physically Abused: No   Sexually Abused: No    FAMILY HISTORY: Family History  Problem Relation Age of Onset   Hypertension Mother    Hypertension Father    Breast cancer Sister     ALLERGIES:  has No Known Allergies.  MEDICATIONS:  Current Outpatient Medications  Medication Sig Dispense Refill   acetaminophen (TYLENOL) 500 MG tablet Take 1,000 mg by mouth daily.     lidocaine-prilocaine (EMLA) cream Apply 1 application topically as needed. 30 g 0   ondansetron (ZOFRAN) 8 MG tablet Take 1 tablet (8 mg total) by mouth every 8 (eight) hours as needed. 30 tablet 0   prochlorperazine (COMPAZINE) 10 MG tablet Take 1 tablet (10 mg total) by mouth every 6 (six) hours as needed for nausea or vomiting. 30 tablet 0   No current facility-administered medications for this visit.    REVIEW OF SYSTEMS:   Constitutional: ( - ) fevers, ( - )  chills , ( - ) night sweats Eyes: ( - ) blurriness of vision, ( - ) double vision, ( - ) watery eyes Ears, nose, mouth, throat, and face: ( - ) mucositis, ( - ) sore throat Respiratory: ( - ) cough, ( - ) dyspnea, ( - ) wheezes Cardiovascular: ( - ) palpitation, ( - ) chest discomfort, ( - ) lower extremity swelling Gastrointestinal:  ( - ) nausea, ( - ) heartburn, ( - ) change in bowel habits Skin: ( - ) abnormal skin rashes Lymphatics: ( - ) new lymphadenopathy, ( - ) easy bruising Neurological: ( - ) numbness, ( - ) tingling, ( - ) new weaknesses Behavioral/Psych: ( - ) mood change, ( - ) new changes  All other systems were reviewed with the patient and are negative.  PHYSICAL EXAMINATION: ECOG PERFORMANCE STATUS: 1 - Symptomatic  but completely ambulatory  Vitals:   04/30/21 0944  BP: (!) 159/83  Pulse: 84  Resp: 18  Temp: (!) 97.3 F (36.3 C)  SpO2: 99%    Filed Weights   04/30/21 0944  Weight: 192 lb 7 oz (87.3 kg)     GENERAL: Well-appearing middle-aged [name], alert, no distress and comfortable SKIN: skin color, texture, turgor are normal, no rashes or significant lesions EYES: conjunctiva are pink and non-injected, sclera clear LUNGS: clear to auscultation and percussion with normal breathing effort HEART: regular rate & rhythm and no murmurs and no lower extremity edema ABDOMEN: soft, non-tender, non-distended, normal bowel sounds.  Ostomy clean dry and intact  Musculoskeletal: no cyanosis of digits and no clubbing  PSYCH: alert & oriented x 3, fluent speech NEURO: no focal motor/sensory deficits  LABORATORY DATA:  I  have reviewed the data as listed CBC Latest Ref Rng & Units 04/30/2021 04/15/2021 04/02/2021  WBC 4.0 - 10.5 K/uL 4.0 4.1 3.2(L)  Hemoglobin 13.0 - 17.0 g/dL 11.7(L) 11.5(L) 11.5(L)  Hematocrit 39.0 - 52.0 % 34.1(L) 34.7(L) 35.0(L)  Platelets 150 - 400 K/uL 113(L) 117(L) 186    CMP Latest Ref Rng & Units 04/15/2021 04/02/2021 03/18/2021  Glucose 70 - 99 mg/dL 247(H) 190(H) 159(H)  BUN 8 - 23 mg/dL 10 15 16   Creatinine 0.61 - 1.24 mg/dL 0.90 0.91 0.85  Sodium 135 - 145 mmol/L 138 140 140  Potassium 3.5 - 5.1 mmol/L 3.6 3.9 3.6  Chloride 98 - 111 mmol/L 106 106 105  CO2 22 - 32 mmol/L 25 23 24   Calcium 8.9 - 10.3 mg/dL 9.2 9.2 9.3  Total Protein 6.5 - 8.1 g/dL 7.1 7.3 7.7  Total Bilirubin 0.3 - 1.2 mg/dL 0.3 0.3 0.3  Alkaline Phos 38 - 126 U/L 96 94 88  AST 15 - 41 U/L 21 19 13(L)  ALT 0 - 44 U/L 24 18 14     RADIOGRAPHIC STUDIES: I have personally reviewed the radiological images as listed and agreed with the findings in the report: no evidence of metastatic diease in the chest. MRI abdomen does not show metastatic disease.   No results found.  ASSESSMENT &  PLAN Curtis [name] 65 y.o. male with medical history significant for rectal adenocarcinoma who presents for a follow up visit.   At this time the patient's findings are most consistent with localized rectal adenocarcinoma.  MRI of the pelvis confirmed this is a T4 N2 M0 stage IIIc cancer.  As such we will plan to proceed with neoadjuvant chemotherapy, followed by chemoradiation, followed by reevaluation for consideration of transabdominal resection.  The initial treatment of choice would be neoadjuvant FOLFOX chemotherapy for 6-8 cycles.  Once this is complete we will transition to chemoradiation with p.o. capecitabine therapy.  When these are complete we will restage for consideration of surgical resection.  # Rectal Adenocarcinoma, T4N2M0 Stage IIIc --  staging with a CT scan of the chest and MRI abdomen/pelvis findings are consistent with localized disease.   --CEA modestly elevated at 17.44 on 02/09/2021 prior to start of therapy. --Given that this is adenocarcinoma of the rectum I would recommend proceeding with neoadjuvant chemotherapy, followed by chemoradiation, followed by definitive surgery. Case discussed at [facility] --Patient has established care with Dr. Lisbeth Renshaw and radiation oncology. Plan: --today is Cycle 5 Day 1 of neoadjuvant FOLFOX  --assure CMP, CBC at each visit, and routine CEA levels --RTC in 2 weeks for Cycle 6  #Anemia #Thrombocyotpenia --mild, today Hgb noted at 11.7 --Plt drop to 113 today stable from prior cycle. --likely secondary to active malignancy and modest iron deficiency. Some contribution from chemotherapy as well.  --continue to monitor  #Supportive Care -- chemotherapy education complete -- port placed -- zofran 8mg  q8H PRN and compazine 10mg  PO q6H for nausea -- EMLA cream for port -- no pain medication required at this time.   No orders of the defined types were placed in this encounter.  All questions were answered. The patient knows to call the  clinic with any problems, questions or concerns.  A total of more than 30 minutes were spent on this encounter with face-to-face time and non-face-to-face time, including preparing to see the patient, ordering tests and/or medications, counseling the patient and coordination of care as outlined above.   Ledell Peoples, MD Department of Hematology/Oncology [facility]  [place] at [facility] Phone: [phone] Pager: [phone] Email: [email or web address]  04/30/2021 10:01 AM

## 2021-04-30 NOTE — Patient Instructions (Addendum)
[facility] ONCOLOGY  Discharge Instructions: Thank you for choosing [facility] to provide your oncology and hematology care.   If you have a lab appointment with the [facility], please go directly to the [facility] and check in at the registration area.   Wear comfortable clothing and clothing appropriate for easy access to any Portacath or PICC line.   We strive to give you quality time with your provider. You may need to reschedule your appointment if you arrive late (15 or more minutes).  Arriving late affects you and other patients whose appointments are after yours.  Also, if you miss three or more appointments without notifying the office, you may be dismissed from the clinic at the provider's discretion.      For prescription refill requests, have your pharmacy contact our office and allow 72 hours for refills to be completed.    Today you received the following chemotherapy and/or immunotherapy agents: Oxaliplatin, Leucovorin, & Fluorouracil   To help prevent nausea and vomiting after your treatment, we encourage you to take your nausea medication as directed.  BELOW ARE SYMPTOMS THAT SHOULD BE REPORTED IMMEDIATELY: *FEVER GREATER THAN 100.4 F (38 C) OR HIGHER *CHILLS OR SWEATING *NAUSEA AND VOMITING THAT IS NOT CONTROLLED WITH YOUR NAUSEA MEDICATION *UNUSUAL SHORTNESS OF BREATH *UNUSUAL BRUISING OR BLEEDING *URINARY PROBLEMS (pain or burning when urinating, or frequent urination) *BOWEL PROBLEMS (unusual diarrhea, constipation, pain near the anus) TENDERNESS IN MOUTH AND THROAT WITH OR WITHOUT PRESENCE OF ULCERS (sore throat, sores in mouth, or a toothache) UNUSUAL RASH, SWELLING OR PAIN  UNUSUAL VAGINAL DISCHARGE OR ITCHING   Items with * indicate a potential emergency and should be followed up as soon as possible or go to the Emergency Department if any problems should occur.  Please show the CHEMOTHERAPY ALERT CARD or IMMUNOTHERAPY  ALERT CARD at check-in to the Emergency Department and triage nurse.  Should you have questions after your visit or need to cancel or reschedule your appointment, please contact [facility]  Dept: [phone]  and follow the prompts.  Office hours are 8:00 a.m. to 4:30 p.m. Monday - Friday. Please note that voicemails left after 4:00 p.m. may not be returned until the following business day.  We are closed weekends and major holidays. You have access to a nurse at all times for urgent questions. Please call the main number to the clinic Dept: [phone] and follow the prompts.   For any non-urgent questions, you may also contact your provider using MyChart. We now offer e-Visits for anyone 63 and older to request care online for non-urgent symptoms. For details visit [email or web address].   Also download the MyChart app! Go to the app store, search "MyChart", open the app, select [facility], and log in with your MyChart username and password.  Due to Covid, a mask is required upon entering the hospital/clinic. If you do not have a mask, one will be given to you upon arrival. For doctor visits, patients may have 1 support person aged 63 or older with them. For treatment visits, patients cannot have anyone with them due to current Covid guidelines and our immunocompromised population.   The chemotherapy medication bag should finish at 46 hours, 96 hours, or 7 days. For example, if your pump is scheduled for 46 hours and it was put on at 4:00 p.m., it should finish at 2:00 p.m. the day it is scheduled to come off regardless of your  appointment time.     Estimated time to finish at 12:45 PM.   If the display on your pump reads "Low Volume" and it is beeping, take the batteries out of the pump and come to the cancer center for it to be taken off.   If the pump alarms go off prior to the pump reading "Low Volume" then call [phone] and someone can assist  you.  If the plunger comes out and the chemotherapy medication is leaking out, please use your home chemo spill kit to clean up the spill. Do NOT use paper towels or other household products.  If you have problems or questions regarding your pump, please call either [phone] (24 hours a day) or the cancer center Monday-Friday 8:00 a.m.- 4:30 p.m. at the clinic number and we will assist you. If you are unable to get assistance, then go to the nearest Emergency Department and ask the staff to contact the IV team for assistance.

## 2021-05-02 ENCOUNTER — Inpatient Hospital Stay: Payer: Self-pay

## 2021-05-02 ENCOUNTER — Other Ambulatory Visit: Payer: Self-pay

## 2021-05-02 VITALS — BP 137/75 | HR 106 | Temp 97.5°F | Resp 20

## 2021-05-02 DIAGNOSIS — C20 Malignant neoplasm of rectum: Secondary | ICD-10-CM

## 2021-05-02 MED ORDER — SODIUM CHLORIDE 0.9% FLUSH
10.0000 mL | INTRAVENOUS | Status: DC | PRN
  Administered 2021-05-02: 10 mL

## 2021-05-02 MED ORDER — HEPARIN SOD (PORK) LOCK FLUSH 100 UNIT/ML IV SOLN
500.0000 [IU] | Freq: Once | INTRAVENOUS | Status: AC | PRN
  Administered 2021-05-02: 500 [IU]

## 2021-05-03 ENCOUNTER — Encounter: Payer: Self-pay | Admitting: Hematology and Oncology

## 2021-05-07 ENCOUNTER — Other Ambulatory Visit: Payer: Self-pay | Admitting: Hematology and Oncology

## 2021-05-07 MED ORDER — LIDOCAINE VISCOUS HCL 2 % MT SOLN: 15.0000 mL | Freq: Four times a day (QID) | OROMUCOSAL | 1 refills | Status: DC | PRN

## 2021-05-14 ENCOUNTER — Encounter (HOSPITAL_COMMUNITY): Payer: Self-pay | Admitting: Emergency Medicine

## 2021-05-14 ENCOUNTER — Inpatient Hospital Stay: Payer: Self-pay

## 2021-05-14 ENCOUNTER — Inpatient Hospital Stay (HOSPITAL_BASED_OUTPATIENT_CLINIC_OR_DEPARTMENT_OTHER): Payer: Self-pay | Admitting: Hematology and Oncology

## 2021-05-14 ENCOUNTER — Other Ambulatory Visit: Payer: Self-pay

## 2021-05-14 ENCOUNTER — Emergency Department (HOSPITAL_COMMUNITY)
Admission: EM | Admit: 2021-05-14 | Discharge: 2021-05-14 | Disposition: A | Discharge status: Home / Self Care | Payer: Self-pay | Attending: Emergency Medicine | Admitting: Emergency Medicine

## 2021-05-14 VITALS — BP 167/95 | HR 78 | Temp 97.6°F | Resp 16 | Ht 70.0 in | Wt 183.8 lb

## 2021-05-14 DIAGNOSIS — C20 Malignant neoplasm of rectum: Secondary | ICD-10-CM

## 2021-05-14 DIAGNOSIS — R739 Hyperglycemia, unspecified: Secondary | ICD-10-CM

## 2021-05-14 DIAGNOSIS — F17210 Nicotine dependence, cigarettes, uncomplicated: Secondary | ICD-10-CM | POA: Insufficient documentation

## 2021-05-14 DIAGNOSIS — Z85040 Personal history of malignant carcinoid tumor of rectum: Secondary | ICD-10-CM | POA: Insufficient documentation

## 2021-05-14 DIAGNOSIS — Z95828 Presence of other vascular implants and grafts: Secondary | ICD-10-CM

## 2021-05-14 DIAGNOSIS — E1165 Type 2 diabetes mellitus with hyperglycemia: Secondary | ICD-10-CM | POA: Insufficient documentation

## 2021-05-14 LAB — CMP (CANCER CENTER ONLY)
ALT: 29 U/L (ref 0–44)
AST: 26 U/L (ref 15–41)
Albumin: 3.5 g/dL (ref 3.5–5.0)
Alkaline Phosphatase: 133 U/L — ABNORMAL HIGH (ref 38–126)
Anion gap: 12 (ref 5–15)
BUN: 8 mg/dL (ref 8–23)
CO2: 23 mmol/L (ref 22–32)
Calcium: 9.4 mg/dL (ref 8.9–10.3)
Chloride: 101 mmol/L (ref 98–111)
Creatinine: 1.03 mg/dL (ref 0.61–1.24)
GFR, Estimated: >60 mL/min (ref >60)
Glucose, Bld: 521 mg/dL (ref 70–99)
Potassium: 3.5 mmol/L (ref 3.5–5.1)
Sodium: 136 mmol/L (ref 135–145)
Total Bilirubin: 0.3 mg/dL (ref 0.3–1.2)
Total Protein: 7 g/dL (ref 6.5–8.1)

## 2021-05-14 LAB — CBC WITH DIFFERENTIAL (CANCER CENTER ONLY)
Abs Immature Granulocytes: 0 10*3/uL (ref 0.00–0.07)
Basophils Absolute: 0 10*3/uL (ref 0.0–0.1)
Basophils Relative: 1 %
Eosinophils Absolute: 0.1 10*3/uL (ref 0.0–0.5)
Eosinophils Relative: 2 %
HCT: 35 % — ABNORMAL LOW (ref 39.0–52.0)
Hemoglobin: 11.9 g/dL — ABNORMAL LOW (ref 13.0–17.0)
Immature Granulocytes: 0 %
Lymphocytes Relative: 41 %
Lymphs Abs: 1 10*3/uL (ref 0.7–4.0)
MCH: 28 pg (ref 26.0–34.0)
MCHC: 34 g/dL (ref 30.0–36.0)
MCV: 82.4 fL (ref 80.0–100.0)
Monocytes Absolute: 0.3 10*3/uL (ref 0.1–1.0)
Monocytes Relative: 12 %
Neutro Abs: 1.1 10*3/uL — ABNORMAL LOW (ref 1.7–7.7)
Neutrophils Relative %: 44 %
Platelet Count: 148 10*3/uL — ABNORMAL LOW (ref 150–400)
RBC: 4.25 MIL/uL (ref 4.22–5.81)
RDW: 17.1 % — ABNORMAL HIGH (ref 11.5–15.5)
WBC Count: 2.5 10*3/uL — ABNORMAL LOW (ref 4.0–10.5)
nRBC: 0 % (ref 0.0–0.2)

## 2021-05-14 LAB — CBG MONITORING, ED
Glucose-Capillary: 445 mg/dL — ABNORMAL HIGH (ref 70–99)
Glucose-Capillary: 359 mg/dL — ABNORMAL HIGH (ref 70–99)
Glucose-Capillary: 305 mg/dL — ABNORMAL HIGH (ref 70–99)

## 2021-05-14 MED ORDER — LEUCOVORIN CALCIUM INJECTION 350 MG
400.0000 mg/m2 | Freq: Once | INTRAVENOUS | Status: AC
  Administered 2021-05-14: 836 mg via INTRAVENOUS
  Filled 2021-05-14: qty 41.8

## 2021-05-14 MED ORDER — FLUOROURACIL CHEMO INJECTION 2.5 GM/50ML
400.0000 mg/m2 | Freq: Once | INTRAVENOUS | Status: AC
  Administered 2021-05-14: 850 mg via INTRAVENOUS
  Filled 2021-05-14: qty 17

## 2021-05-14 MED ORDER — LIDOCAINE VISCOUS HCL 2 % MT SOLN: 15.0000 mL | Freq: Four times a day (QID) | OROMUCOSAL | 2 refills | Status: DC | PRN

## 2021-05-14 MED ORDER — METFORMIN HCL 500 MG PO TABS: 500.0000 mg | ORAL_TABLET | Freq: Two times a day (BID) | ORAL | 0 refills | Status: DC

## 2021-05-14 MED ORDER — DEXTROSE 5 % IV SOLN: Freq: Once | INTRAVENOUS | Status: AC

## 2021-05-14 MED ORDER — OXALIPLATIN CHEMO INJECTION 100 MG/20ML
85.0000 mg/m2 | Freq: Once | INTRAVENOUS | Status: AC
  Administered 2021-05-14: 180 mg via INTRAVENOUS
  Filled 2021-05-14: qty 36

## 2021-05-14 MED ORDER — SODIUM CHLORIDE 0.9% FLUSH
10.0000 mL | Freq: Once | INTRAVENOUS | Status: AC
  Administered 2021-05-14: 10 mL

## 2021-05-14 MED ORDER — SODIUM CHLORIDE 0.9 % IV SOLN
10.0000 mg | Freq: Once | INTRAVENOUS | Status: AC
  Administered 2021-05-14: 10 mg via INTRAVENOUS
  Filled 2021-05-14: qty 10

## 2021-05-14 MED ORDER — PALONOSETRON HCL INJECTION 0.25 MG/5ML
0.2500 mg | Freq: Once | INTRAVENOUS | Status: AC
  Administered 2021-05-14: 0.25 mg via INTRAVENOUS
  Filled 2021-05-14: qty 5

## 2021-05-14 MED ORDER — SODIUM CHLORIDE 0.9 % IV SOLN
2400.0000 mg/m2 | INTRAVENOUS | Status: DC
  Administered 2021-05-14: 5000 mg via INTRAVENOUS
  Filled 2021-05-14: qty 100

## 2021-05-14 MED ORDER — SODIUM CHLORIDE 0.9 % IV BOLUS
2000.0000 mL | Freq: Once | INTRAVENOUS | Status: AC
  Administered 2021-05-14: 2000 mL via INTRAVENOUS

## 2021-05-14 MED ORDER — INSULIN ASPART 100 UNIT/ML IJ SOLN
10.0000 [IU] | Freq: Once | INTRAMUSCULAR | Status: AC
  Administered 2021-05-14: 10 [IU] via SUBCUTANEOUS
  Filled 2021-05-14: qty 0.1

## 2021-05-14 MED ORDER — SODIUM CHLORIDE 0.9 % IV SOLN: INTRAVENOUS | Status: DC

## 2021-05-14 NOTE — Patient Instructions (Signed)
[facility] ONCOLOGY   Discharge Instructions: Thank you for choosing [facility] to provide your oncology and hematology care.   If you have a lab appointment with the [facility], please go directly to the [facility] and check in at the registration area.   Wear comfortable clothing and clothing appropriate for easy access to any Portacath or PICC line.   We strive to give you quality time with your provider. You may need to reschedule your appointment if you arrive late (15 or more minutes).  Arriving late affects you and other patients whose appointments are after yours.  Also, if you miss three or more appointments without notifying the office, you may be dismissed from the clinic at the provider's discretion.      For prescription refill requests, have your pharmacy contact our office and allow 72 hours for refills to be completed.    Today you received the following chemotherapy and/or immunotherapy agents: oxaliplatin, leucovorin, and flurouracil.      To help prevent nausea and vomiting after your treatment, we encourage you to take your nausea medication as directed.  BELOW ARE SYMPTOMS THAT SHOULD BE REPORTED IMMEDIATELY: *FEVER GREATER THAN 100.4 F (38 C) OR HIGHER *CHILLS OR SWEATING *NAUSEA AND VOMITING THAT IS NOT CONTROLLED WITH YOUR NAUSEA MEDICATION *UNUSUAL SHORTNESS OF BREATH *UNUSUAL BRUISING OR BLEEDING *URINARY PROBLEMS (pain or burning when urinating, or frequent urination) *BOWEL PROBLEMS (unusual diarrhea, constipation, pain near the anus) TENDERNESS IN MOUTH AND THROAT WITH OR WITHOUT PRESENCE OF ULCERS (sore throat, sores in mouth, or a toothache) UNUSUAL RASH, SWELLING OR PAIN  UNUSUAL VAGINAL DISCHARGE OR ITCHING   Items with * indicate a potential emergency and should be followed up as soon as possible or go to the Emergency Department if any problems should occur.  Please show the CHEMOTHERAPY ALERT CARD or  IMMUNOTHERAPY ALERT CARD at check-in to the Emergency Department and triage nurse.  Should you have questions after your visit or need to cancel or reschedule your appointment, please contact [facility]  Dept: [phone]  and follow the prompts.  Office hours are 8:00 a.m. to 4:30 p.m. Monday - Friday. Please note that voicemails left after 4:00 p.m. may not be returned until the following business day.  We are closed weekends and major holidays. You have access to a nurse at all times for urgent questions. Please call the main number to the clinic Dept: [phone] and follow the prompts.   For any non-urgent questions, you may also contact your provider using MyChart. We now offer e-Visits for anyone 41 and older to request care online for non-urgent symptoms. For details visit [email or web address].   Also download the MyChart app! Go to the app store, search "MyChart", open the app, select [facility], and log in with your MyChart username and password.  Due to Covid, a mask is required upon entering the hospital/clinic. If you do not have a mask, one will be given to you upon arrival. For doctor visits, patients may have 1 support person aged 19 or older with them. For treatment visits, patients cannot have anyone with them due to current Covid guidelines and our immunocompromised population.

## 2021-05-14 NOTE — ED Notes (Signed)
Per cancer center-states CBG in the 500's-not a diabetic-receiving chemo today then will come to ED for eval

## 2021-05-14 NOTE — ED Triage Notes (Signed)
Per pt, states oncologist wanted him to come to ED due to elevated blood sugar-no history of diabetes-no complaints from patient

## 2021-05-14 NOTE — Progress Notes (Signed)
Ok to treat with ANC less than 1.5 today per Dr Lorenso Courier.

## 2021-05-14 NOTE — Progress Notes (Signed)
Curtis [name] [phone]   [phone]  PROGRESS NOTE  Patient Care Team: Pcp, No as PCP - General Orson Slick, MD as Consulting Physician (Medical Oncology) Royston Bake, RN as Oncology Nurse Navigator (Oncology)  Hematological/Oncological History # Rectal Adenocarcinoma, T4N2M0. Stage IIIc 01/16/2021: presented to the emergency department with abdominal pain. CT abdomen/pelvis showed concern for mass lesion with perforation and surrounding adenopathy. 01/19/2021: repeat CT scan showed worsening inflammatory changes around the complex rectal process 01/21/2021: flexible sigmoidoscopy performed, lesion at 12 cm from the anal verge.  biopsy showed concern for adenocarcinoma. 01/21/2021: patient underwent a laparoscopic assisted loop colostomy with Dr. Georgette Dover. 02/09/2021: establish care with Dr. Lorenso Courier  03/05/2021: Cycle 1 Day 1 of neoadjuvant FOLFOX 03/18/2021: Cycle 2 Day 1 of neoadjuvant FOLFOX 04/02/2021: Cycle 3 Day 1 of neoadjuvant FOLFOX 04/15/2021: Cycle 4 Day 1 of neoadjuvant FOLFOX 04/30/2021: Cycle 5 Day 1 of neoadjuvant FOLFOX 05/14/2021: Cycle 6 Day 1 of neoadjuvant FOLFOX  Interval History:  Curtis [name] 65 y.o. male with medical history significant for rectal adenocarcinoma who presents for a follow up visit. The patient's last visit was on 05/14/2021. In the interim since the last visit he has completed Cycle 5 of neoadjuvant FOLFOX.   On exam today Curtis [name] reports he has had a difficult time with recovery from his last cycle of chemotherapy.  His appetite has been somewhat poor and he has been having difficulty with soreness in the back of his throat.  He notes he is using lidocaine mouthwash with some relief.  He reports he has been sleeping okay and not having any other major symptoms as result of the chemotherapy.  He does note he was able to eat well yesterday when the pain in his throat was under better control.  He unfortunately has  continued to lose weight due to this issue.  He is willing and able to proceed with treatment at this time.  He otherwise denies any fevers, chills, sweats, nausea, vomiting or diarrhea.  A full 10 point ROS is listed below.  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[place])     SURGICAL HISTORY: Past Surgical History:  Procedure Laterality Date   BIOPSY  01/21/2021   Procedure: BIOPSY;  Surgeon: Mauri Pole, MD;  Location: [facility];  Service: Endoscopy;;   COLON RESECTION N/A 01/21/2021   Procedure: LAPAROSCOPIC ASSISTED LOOP COLOSTOMY;  Surgeon: Donnie Mesa, MD;  Location: [facility];  Service: General;  Laterality: N/A;   FLEXIBLE SIGMOIDOSCOPY N/A 01/21/2021   Procedure: FLEXIBLE SIGMOIDOSCOPY;  Surgeon: Mauri Pole, MD;  Location: [facility];  Service: Endoscopy;  Laterality: N/A;   IR IMAGING GUIDED PORT INSERTION  03/04/2021   SUBMUCOSAL TATTOO INJECTION  01/21/2021   Procedure: SUBMUCOSAL TATTOO INJECTION;  Surgeon: Mauri Pole, MD;  Location: [facility];  Service: Endoscopy;;    SOCIAL HISTORY: Social History   Socioeconomic History   Marital status: Divorced    Spouse name: Not on file   Number of children: Not on file   Years of education: Not on file   Highest education level: Not on file  Occupational History   Not on file  Tobacco Use   Smoking status: Every Day    Types: Cigarettes   Smokeless tobacco: Never  Vaping Use   Vaping Use: Never used  Substance and Sexual Activity   Alcohol use: Never   Drug use: Never   Sexual activity: Never  Other Topics Concern   Not on file  Social History Narrative   Not on file   Social Determinants of Health   Financial Resource Strain: Low Risk    Difficulty of Paying Living Expenses: Not very hard  Food Insecurity: No Food Insecurity   Worried About Running Out of Food in the Last Year: Never true   Ran Out of Food in the Last Year: Never true   Transportation Needs: No Transportation Needs   Lack of Transportation (Medical): No   Lack of Transportation (Non-Medical): No  Physical Activity: Not on file  Stress: Not on file  Social Connections: Not on file  Intimate Partner Violence: Not At Risk   Fear of Current or Ex-Partner: No   Emotionally Abused: No   Physically Abused: No   Sexually Abused: No    FAMILY HISTORY: Family History  Problem Relation Age of Onset   Hypertension Mother    Hypertension Father    Breast cancer Sister     ALLERGIES:  has No Known Allergies.  MEDICATIONS:  Current Outpatient Medications  Medication Sig Dispense Refill   acetaminophen (TYLENOL) 500 MG tablet Take 1,000 mg by mouth daily.     lidocaine (XYLOCAINE) 2 % solution Use as directed 15 mLs in the mouth or throat every 6 (six) hours as needed for mouth pain. 100 mL 2   lidocaine-prilocaine (EMLA) cream Apply 1 application topically as needed. 30 g 0   ondansetron (ZOFRAN) 8 MG tablet Take 1 tablet (8 mg total) by mouth every 8 (eight) hours as needed. 30 tablet 0   prochlorperazine (COMPAZINE) 10 MG tablet Take 1 tablet (10 mg total) by mouth every 6 (six) hours as needed for nausea or vomiting. 30 tablet 0   No current facility-administered medications for this visit.    REVIEW OF SYSTEMS:   Constitutional: ( - ) fevers, ( - )  chills , ( - ) night sweats Eyes: ( - ) blurriness of vision, ( - ) double vision, ( - ) watery eyes Ears, nose, mouth, throat, and face: ( - ) mucositis, ( - ) sore throat Respiratory: ( - ) cough, ( - ) dyspnea, ( - ) wheezes Cardiovascular: ( - ) palpitation, ( - ) chest discomfort, ( - ) lower extremity swelling Gastrointestinal:  ( - ) nausea, ( - ) heartburn, ( - ) change in bowel habits Skin: ( - ) abnormal skin rashes Lymphatics: ( - ) new lymphadenopathy, ( - ) easy bruising Neurological: ( - ) numbness, ( - ) tingling, ( - ) new weaknesses Behavioral/Psych: ( - ) mood change, ( - ) new  changes  All other systems were reviewed with the patient and are negative.  PHYSICAL EXAMINATION: ECOG PERFORMANCE STATUS: 1 - Symptomatic but completely ambulatory  Vitals:   05/14/21 1033  BP: (!) 167/95  Pulse: 78  Resp: 16  Temp: 97.6 F (36.4 C)  SpO2: 100%     Filed Weights   05/14/21 1033  Weight: 183 lb 12.8 oz (83.4 kg)     GENERAL: Well-appearing middle-aged Curtis [name] male, alert, no distress and comfortable SKIN: skin color, texture, turgor are normal, no rashes or significant lesions THROAT: mild ulcerations with no overt bleeding or evidence of infection.  EYES: conjunctiva are pink and non-injected, sclera clear LUNGS: clear to auscultation and percussion with normal breathing effort HEART: regular rate & rhythm and no murmurs and no lower extremity edema ABDOMEN: soft, non-tender, non-distended, normal bowel sounds.  Ostomy  clean dry and intact  Musculoskeletal: no cyanosis of digits and no clubbing  PSYCH: alert & oriented x 3, fluent speech NEURO: no focal motor/sensory deficits  LABORATORY DATA:  I have reviewed the data as listed CBC Latest Ref Rng & Units 05/14/2021 04/30/2021 04/15/2021  WBC 4.0 - 10.5 K/uL 2.5(L) 4.0 4.1  Hemoglobin 13.0 - 17.0 g/dL 11.9(L) 11.7(L) 11.5(L)  Hematocrit 39.0 - 52.0 % 35.0(L) 34.1(L) 34.7(L)  Platelets 150 - 400 K/uL 148(L) 113(L) 117(L)    CMP Latest Ref Rng & Units 05/14/2021 04/30/2021 04/15/2021  Glucose 70 - 99 mg/dL 521(HH) 496(H) 247(H)  BUN 8 - 23 mg/dL 8 8 10   Creatinine 0.61 - 1.24 mg/dL 1.03 1.05 0.90  Sodium 135 - 145 mmol/L 136 135 138  Potassium 3.5 - 5.1 mmol/L 3.5 3.3(L) 3.6  Chloride 98 - 111 mmol/L 101 102 106  CO2 22 - 32 mmol/L 23 23 25   Calcium 8.9 - 10.3 mg/dL 9.4 9.2 9.2  Total Protein 6.5 - 8.1 g/dL 7.0 6.8 7.1  Total Bilirubin 0.3 - 1.2 mg/dL 0.3 0.3 0.3  Alkaline Phos 38 - 126 U/L 133(H) 116 96  AST 15 - 41 U/L 26 17 21   ALT 0 - 44 U/L 29 17 24     RADIOGRAPHIC STUDIES: I have  personally reviewed the radiological images as listed and agreed with the findings in the report: no evidence of metastatic diease in the chest. MRI abdomen does not show metastatic disease.   No results found.  ASSESSMENT & PLAN Curtis [name] 65 y.o. male with medical history significant for rectal adenocarcinoma who presents for a follow up visit.   At this time the patient's findings are most consistent with localized rectal adenocarcinoma.  MRI of the pelvis confirmed this is a T4 N2 M0 stage IIIc cancer.  As such we will plan to proceed with neoadjuvant chemotherapy, followed by chemoradiation, followed by reevaluation for consideration of transabdominal resection.  The initial treatment of choice would be neoadjuvant FOLFOX chemotherapy for 8 cycles.  Once this is complete we will transition to chemoradiation with p.o. capecitabine therapy.  When these are complete we will restage for consideration of surgical resection.  # Rectal Adenocarcinoma, T4N2M0 Stage IIIc --  staging with a CT scan of the chest and MRI abdomen/pelvis findings are consistent with localized disease.   --CEA modestly elevated at 17.44 on 02/09/2021 prior to start of therapy. --Given that this is adenocarcinoma of the rectum I would recommend proceeding with neoadjuvant chemotherapy, followed by chemoradiation, followed by definitive surgery. Case discussed at [facility] --Patient has established care with Dr. Lisbeth Renshaw and radiation oncology. Plan: --today is Cycle 6 Day 1 of neoadjuvant FOLFOX  --assure CMP, CBC at each visit, and routine CEA levels --RTC in 2 weeks for Cycle 7  #Anemia #Thrombocyotpenia --mild, today Hgb noted at 11.9 --Plt increase to 148 today stable from prior cycle. --likely secondary to active malignancy and modest iron deficiency. Some contribution from chemotherapy as well.  --continue to monitor  #Supportive Care -- chemotherapy education complete -- port placed -- zofran 8mg  q8H PRN and  compazine 10mg  PO q6H for nausea -- EMLA cream for port -- no pain medication required at this time.   No orders of the defined types were placed in this encounter.  All questions were answered. The patient knows to call the clinic with any problems, questions or concerns.  A total of more than 30 minutes were spent on this encounter with face-to-face time and non-face-to-face  time, including preparing to see the patient, ordering tests and/or medications, counseling the patient and coordination of care as outlined above.   Curtis Peoples, MD Department of Hematology/Oncology [place] [facility] Phone: [phone] Pager: [phone] Email: [name]  05/14/2021 11:09 AM

## 2021-05-14 NOTE — Discharge Instructions (Addendum)
Follow-up in the cancer center for your high blood glucose

## 2021-05-14 NOTE — ED Provider Notes (Addendum)
[facility] DEPT Provider Note   CSN: [id number] Arrival date & time: 05/14/21  1609     History Chief Complaint  Patient presents with   Hyperglycemia    Curtis [name] is a 65 y.o. male.  65 year old male with history of rectosigmoid cancer presents due to being hypoglycemic after chemo.  He has no prior history of diabetes.  Has not received any steroids recently.  States that he has been eating and intense amount of sugary foods recently.  Has had polyuria as well as polydipsia.  Denies abdominal or chest comfort.  Not had any emesis.  Was sent here for further management      Past Medical History:  Diagnosis Date   Rectosigmoid cancer [facility])     Patient Active Problem List   Diagnosis Date Noted   Port-A-Cath in place 04/02/2021   Rectal cancer ([place]) 02/26/2021   Colonic mass    Bowel perforation ([place]) 01/16/2021   Intra-abdominal abscess ([place]) 01/16/2021   Elevated blood pressure reading 01/16/2021   Leukocytosis 01/16/2021   Hepatic steatosis 01/16/2021   Bladder wall thickening 01/16/2021   Microscopic hematuria 01/16/2021    Past Surgical History:  Procedure Laterality Date   BIOPSY  01/21/2021   Procedure: BIOPSY;  Surgeon: Mauri Pole, MD;  Location: [facility];  Service: Endoscopy;;   COLON RESECTION N/A 01/21/2021   Procedure: LAPAROSCOPIC ASSISTED LOOP COLOSTOMY;  Surgeon: Donnie Mesa, MD;  Location: [facility];  Service: General;  Laterality: N/A;   FLEXIBLE SIGMOIDOSCOPY N/A 01/21/2021   Procedure: FLEXIBLE SIGMOIDOSCOPY;  Surgeon: Mauri Pole, MD;  Location: [facility];  Service: Endoscopy;  Laterality: N/A;   IR IMAGING GUIDED PORT INSERTION  03/04/2021   SUBMUCOSAL TATTOO INJECTION  01/21/2021   Procedure: SUBMUCOSAL TATTOO INJECTION;  Surgeon: Mauri Pole, MD;  [facility];  Service: Endoscopy;;       Family History  Problem Relation Age of Onset   Hypertension Mother     Hypertension Father    Breast cancer Sister     Social History   Tobacco Use   Smoking status: Every Day    Types: Cigarettes   Smokeless tobacco: Never  Vaping Use   Vaping Use: Never used  Substance Use Topics   Alcohol use: Never   Drug use: Never    Home Medications Prior to Admission medications   Medication Sig Start Date End Date Taking? Authorizing Provider  acetaminophen (TYLENOL) 500 MG tablet Take 1,000 mg by mouth daily.    [provider]  lidocaine (XYLOCAINE) 2 % solution Use as directed 15 mLs in the mouth or throat every 6 (six) hours as needed for mouth pain. 05/14/21   Orson Slick, MD  lidocaine-prilocaine (EMLA) cream Apply 1 application topically as needed. 02/26/21   Orson Slick, MD  ondansetron (ZOFRAN) 8 MG tablet Take 1 tablet (8 mg total) by mouth every 8 (eight) hours as needed. 02/26/21   Orson Slick, MD  prochlorperazine (COMPAZINE) 10 MG tablet Take 1 tablet (10 mg total) by mouth every 6 (six) hours as needed for nausea or vomiting. 02/26/21   Orson Slick, MD    Allergies    Patient has no known allergies.  Review of Systems   Review of Systems  All other systems reviewed and are negative.  Physical Exam Updated Vital Signs BP (!) 166/100 (BP Location: Right Arm)   Pulse 96   Temp 97.9 F (36.6 C) (  Oral)   Resp 18   SpO2 100%   Physical Exam Vitals and nursing note reviewed.  Constitutional:      General: He is not in acute distress.    Appearance: Normal appearance. He is well-developed. He is not toxic-appearing.  HENT:     Head: Normocephalic and atraumatic.  Eyes:     General: Lids are normal.     Conjunctiva/sclera: Conjunctivae normal.     Pupils: Pupils are equal, round, and reactive to light.  Neck:     Thyroid: No thyroid mass.     Trachea: No tracheal deviation.  Cardiovascular:     Rate and Rhythm: Normal rate and regular rhythm.     Heart sounds: Normal heart sounds. No murmur heard.   No  gallop.  Pulmonary:     Effort: Pulmonary effort is normal. No respiratory distress.     Breath sounds: Normal breath sounds. No stridor. No decreased breath sounds, wheezing, rhonchi or rales.  Abdominal:     General: There is no distension.     Palpations: Abdomen is soft.     Tenderness: There is no abdominal tenderness. There is no rebound.  Musculoskeletal:        General: No tenderness. Normal range of motion.     Cervical back: Normal range of motion and neck supple.  Skin:    General: Skin is warm and dry.     Findings: No abrasion or rash.  Neurological:     Mental Status: He is alert and oriented to person, place, and time. Mental status is at baseline.     GCS: GCS eye subscore is 4. GCS verbal subscore is 5. GCS motor subscore is 6.     Cranial Nerves: No cranial nerve deficit.     Sensory: No sensory deficit.     Motor: Motor function is intact.  Psychiatric:        Attention and Perception: Attention normal.        Speech: Speech normal.        Behavior: Behavior normal.    ED Results / Procedures / Treatments   Labs (all labs ordered are listed, but only abnormal results are displayed) Labs Reviewed  CBG MONITORING, ED - Abnormal; Notable for the following components:      Result Value   Glucose-Capillary 445 (*)    All other components within normal limits    EKG None  Radiology No results found.  Procedures Procedures   Medications Ordered in ED Medications  sodium chloride 0.9 % bolus 2,000 mL (has no administration in time range)  0.9 %  sodium chloride infusion (has no administration in time range)  insulin aspart (novoLOG) injection 10 Units (has no administration in time range)    ED Course  I have reviewed the triage vital signs and the nursing notes.  Pertinent labs & imaging results that were available during my care of the patient were reviewed by me and considered in my medical decision making (see chart for details).    MDM  Rules/Calculators/A&P                          Patient has no evidence of DKA.  His anion gap is 12 Patient treated with IV fluids as well as insulin and blood sugar has improved.  Will start on Glucophage and have patient follow-up in the cancer center Final Clinical Impression(s) / ED Diagnoses Final diagnoses:  None  Rx / DC Orders ED Discharge Orders     None        Lacretia Leigh, MD 05/14/21 1931    Lacretia Leigh, MD 05/14/21 [phone]

## 2021-05-15 LAB — GLUCOSE, CAPILLARY: Glucose-Capillary: 426 mg/dL — ABNORMAL HIGH (ref 70–99)

## 2021-05-16 ENCOUNTER — Other Ambulatory Visit: Payer: Self-pay

## 2021-05-16 ENCOUNTER — Inpatient Hospital Stay: Payer: Self-pay

## 2021-05-16 VITALS — BP 159/94 | HR 115 | Temp 97.4°F | Resp 20

## 2021-05-16 DIAGNOSIS — Z95828 Presence of other vascular implants and grafts: Secondary | ICD-10-CM

## 2021-05-16 MED ORDER — SODIUM CHLORIDE 0.9% FLUSH
10.0000 mL | Freq: Once | INTRAVENOUS | Status: AC
  Administered 2021-05-16: 10 mL

## 2021-05-16 MED ORDER — HEPARIN SOD (PORK) LOCK FLUSH 100 UNIT/ML IV SOLN
500.0000 [IU] | Freq: Once | INTRAVENOUS | Status: AC
  Administered 2021-05-16: 500 [IU]

## 2021-05-19 ENCOUNTER — Other Ambulatory Visit: Payer: Self-pay | Admitting: *Deleted

## 2021-05-19 ENCOUNTER — Telehealth: Payer: Self-pay | Admitting: *Deleted

## 2021-05-19 DIAGNOSIS — Z95828 Presence of other vascular implants and grafts: Secondary | ICD-10-CM

## 2021-05-19 DIAGNOSIS — C20 Malignant neoplasm of rectum: Secondary | ICD-10-CM

## 2021-05-19 NOTE — Telephone Encounter (Signed)
Patient called - LVM that diabetes medicine did not agree with him. He was having repeated episodes of diarrhea and it was breaking through his colostomy bag. Contacted patient - he said diarrhea started almost as soon as he started Metformin. He stopped taking Metformin 2 days ago as diarrhea kept getting worse. He reports the skin around his colostomy red and peeling and he has used 5 bags since yesterday. He also has sores at each corner of his mouth making it difficult to open his mouth. Dr. Lorenso Courier informed. Contacted patient per Dr. Lorenso Courier with this response - will schedule for [facility] first thing in AM. First appt is 9AM and send schedule message high priority to secure appt for pt. Tell patient to continue holding Metformin. Patient can use Lidocaine 2% solution previously prescribed on corners of mouth until evaluated in AM. Advised patient to expect call from schedulers in AM.  Patient verbalized understanding of all information.  Schedule message sent HP for 9am appt in [facility] 10/26.

## 2021-05-20 ENCOUNTER — Inpatient Hospital Stay: Payer: Self-pay

## 2021-05-20 ENCOUNTER — Other Ambulatory Visit: Payer: Self-pay

## 2021-05-20 ENCOUNTER — Other Ambulatory Visit: Payer: Self-pay | Admitting: Physician Assistant

## 2021-05-20 ENCOUNTER — Inpatient Hospital Stay (HOSPITAL_BASED_OUTPATIENT_CLINIC_OR_DEPARTMENT_OTHER): Payer: Self-pay | Admitting: Physician Assistant

## 2021-05-20 VITALS — BP 138/78 | HR 70 | Temp 98.2°F | Resp 18 | Wt 179.9 lb

## 2021-05-20 DIAGNOSIS — C20 Malignant neoplasm of rectum: Secondary | ICD-10-CM

## 2021-05-20 DIAGNOSIS — R197 Diarrhea, unspecified: Secondary | ICD-10-CM

## 2021-05-20 DIAGNOSIS — B370 Candidal stomatitis: Secondary | ICD-10-CM

## 2021-05-20 DIAGNOSIS — R739 Hyperglycemia, unspecified: Secondary | ICD-10-CM

## 2021-05-20 LAB — CBC WITH DIFFERENTIAL (CANCER CENTER ONLY)
Abs Immature Granulocytes: 0.01 10*3/uL (ref 0.00–0.07)
Basophils Absolute: 0 10*3/uL (ref 0.0–0.1)
Basophils Relative: 1 %
Eosinophils Absolute: 0 10*3/uL (ref 0.0–0.5)
Eosinophils Relative: 0 %
HCT: 36.4 % — ABNORMAL LOW (ref 39.0–52.0)
Hemoglobin: 12.2 g/dL — ABNORMAL LOW (ref 13.0–17.0)
Immature Granulocytes: 0 %
Lymphocytes Relative: 36 %
Lymphs Abs: 0.9 10*3/uL (ref 0.7–4.0)
MCH: 27.7 pg (ref 26.0–34.0)
MCHC: 33.5 g/dL (ref 30.0–36.0)
MCV: 82.7 fL (ref 80.0–100.0)
Monocytes Absolute: 0.1 10*3/uL (ref 0.1–1.0)
Monocytes Relative: 5 %
Neutro Abs: 1.4 10*3/uL — ABNORMAL LOW (ref 1.7–7.7)
Neutrophils Relative %: 58 %
Platelet Count: 164 10*3/uL (ref 150–400)
RBC: 4.4 MIL/uL (ref 4.22–5.81)
RDW: 17 % — ABNORMAL HIGH (ref 11.5–15.5)
WBC Count: 2.5 10*3/uL — ABNORMAL LOW (ref 4.0–10.5)
nRBC: 0 % (ref 0.0–0.2)

## 2021-05-20 LAB — CMP (CANCER CENTER ONLY)
ALT: 22 U/L (ref 0–44)
AST: 22 U/L (ref 15–41)
Albumin: 3.4 g/dL — ABNORMAL LOW (ref 3.5–5.0)
Alkaline Phosphatase: 88 U/L (ref 38–126)
Anion gap: 14 (ref 5–15)
BUN: 9 mg/dL (ref 8–23)
CO2: 21 mmol/L — ABNORMAL LOW (ref 22–32)
Calcium: 9 mg/dL (ref 8.9–10.3)
Chloride: 103 mmol/L (ref 98–111)
Creatinine: 1.06 mg/dL (ref 0.61–1.24)
GFR, Estimated: >60 mL/min (ref >60)
Glucose, Bld: 433 mg/dL — ABNORMAL HIGH (ref 70–99)
Potassium: 3.1 mmol/L — ABNORMAL LOW (ref 3.5–5.1)
Sodium: 138 mmol/L (ref 135–145)
Total Bilirubin: 0.5 mg/dL (ref 0.3–1.2)
Total Protein: 7 g/dL (ref 6.5–8.1)

## 2021-05-20 MED ORDER — SODIUM CHLORIDE 0.9 % IV SOLN: Freq: Once | INTRAVENOUS | Status: AC

## 2021-05-20 MED ORDER — GLIPIZIDE 5 MG PO TABS: 5.0000 mg | ORAL_TABLET | Freq: Every day | ORAL | 0 refills | Status: DC

## 2021-05-20 MED ORDER — NYSTATIN 100000 UNIT/ML MT SUSP: 5.0000 mL | Freq: Four times a day (QID) | OROMUCOSAL | 0 refills | Status: DC

## 2021-05-20 MED ORDER — SODIUM CHLORIDE 0.9% FLUSH: 10.0000 mL | Freq: Once | INTRAVENOUS | Status: DC

## 2021-05-20 MED ORDER — SODIUM CHLORIDE 0.9 % IV SOLN: Freq: Once | INTRAVENOUS | Status: DC

## 2021-05-20 NOTE — Progress Notes (Signed)
Symptom Management Consult note [place]   [phone] [phone]    Patient Care Team: Pcp, No as PCP - General Orson Slick, MD as Consulting Physician (Medical Oncology) Royston Bake, RN as Oncology Nurse Navigator (Oncology)    Name of the patient: Curtis [name]  [id number]  06-Sep-1955   Date of visit: 05/20/2021    Chief complaint/ Reason for visit- diarrhea, mouth sores, elevated blood sugar  Oncology History  Rectal cancer ([place])  02/26/2021 Initial Diagnosis   Rectal cancer ([place])   03/05/2021 -  Chemotherapy   Patient is on Treatment Plan : COLORECTAL FOLFOX q14d x 4 months       Current Therapy: FOLFOX q14 days currently on cycle 6, last tx 05/14/21  Interval history- Curtis [name] is a 65 yo male with history of rectal adenocarcinoma, bowel perforation s/p colostomy presenting to [facility] with chief complaint of diarrhea, mouth sores and hyperglycemia.  Patient reports after his last chemo on 05/14/2021 he was told he had high blood sugar and needed to go to the ED for evaluation.  Patient is labs did not show signs of DKA and he was discharged home with prescription for metformin.  Patient states he took medication for 2.5 days however developed severe diarrhea and discontinued the medicine.  He is last dose of metformin was x3 days ago.  Patient states diarrhea have been progressively worsening, today is the first day it is starting to slow down.  He states yesterday he went through 10 of his colostomy bags.  He describes the diarrhea as light brown in color.  Denies seeing any blood in stool.  He states since the diarrhea started he noticed that his stoma is very red and irritated.  He denies any bleeding from stoma.  Patient does admit that he continues to have increased thirst and frequent urination.  He has not been checking his blood sugar at home.  He has no history of diabetes and denies any recent steroid use.  He denies any  suspicious food intake or sick contacts.  Patient admits to having decreased p.o. intake secondary to frequent diarrhea, he is trying to stay hydrated at home with water.  Patient also states he has sores on his mouth that have been intermittent since starting chemo.  He states he has a white color on his tongue and he has pain with eating and drinking.  He has been trying viscous lidocaine without symptom improvement.     Review of Systems  Constitutional:  Negative for chills and fever.  HENT:  Negative for ear pain and sore throat.   Eyes:  Negative for pain.  Respiratory:  Negative for cough, shortness of breath and wheezing.   Cardiovascular:  Negative for chest pain and leg swelling.  Gastrointestinal:  Positive for diarrhea. Negative for abdominal pain, blood in stool, melena, nausea and vomiting.  Genitourinary:  Positive for frequency.  Musculoskeletal:  Negative for myalgias.  Skin:  Negative for rash.  Neurological:  Negative for dizziness and weakness.  Endo/Heme/Allergies:  Positive for polydipsia.  Psychiatric/Behavioral:  The patient is not nervous/anxious.       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[facility])      Past Surgical History:  Procedure Laterality Date   BIOPSY  01/21/2021   Procedure: BIOPSY;  Surgeon: Mauri Pole, MD;  Location: [place];  Service: Endoscopy;;   COLON RESECTION N/A  01/21/2021   Procedure: LAPAROSCOPIC ASSISTED LOOP COLOSTOMY;  Surgeon: Donnie Mesa, MD;  Location: [place];  Service: General;  Laterality: N/A;   FLEXIBLE SIGMOIDOSCOPY N/A 01/21/2021   Procedure: FLEXIBLE SIGMOIDOSCOPY;  Surgeon: Mauri Pole, MD;  Location: [place] [facility];  Service: Endoscopy;  Laterality: N/A;   IR IMAGING GUIDED PORT INSERTION  03/04/2021   SUBMUCOSAL TATTOO INJECTION  01/21/2021   Procedure: SUBMUCOSAL TATTOO INJECTION;  Surgeon: Mauri Pole, MD;  Location: [facility];  Service:  Endoscopy;;    Social History   Socioeconomic History   Marital status: Divorced    Spouse name: Not on file   Number of children: Not on file   Years of education: Not on file   Highest education level: Not on file  Occupational History   Not on file  Tobacco Use   Smoking status: Every Day    Types: Cigarettes   Smokeless tobacco: Never  Vaping Use   Vaping Use: Never used  Substance and Sexual Activity   Alcohol use: Never   Drug use: Never   Sexual activity: Never  Other Topics Concern   Not on file  Social History Narrative   Not on file   Social Determinants of Health   Financial Resource Strain: Low Risk    Difficulty of Paying Living Expenses: Not very hard  Food Insecurity: No Food Insecurity   Worried About Running Out of Food in the Last Year: Never true   [place] in the Last Year: Never true  Transportation Needs: No Transportation Needs   Lack of Transportation (Medical): No   Lack of Transportation (Non-Medical): No  Physical Activity: Not on file  Stress: Not on file  Social Connections: Not on file  Intimate Partner Violence: Not At Risk   Fear of Current or Ex-Partner: No   Emotionally Abused: No   Physically Abused: No   Sexually Abused: No    Family History  Problem Relation Age of Onset   Hypertension Mother    Hypertension Father    Breast cancer Sister      Current Outpatient Medications:    glipiZIDE (GLUCOTROL) 5 MG tablet, Take 1 tablet (5 mg total) by mouth daily before breakfast., Disp: 30 tablet, Rfl: 0   nystatin (MYCOSTATIN) 100000 UNIT/ML suspension, Take 5 mLs (500,000 Units total) by mouth 4 (four) times daily for 7 days., Disp: 60 mL, Rfl: 0   acetaminophen (TYLENOL) 500 MG tablet, Take 1,000 mg by mouth daily., Disp: , Rfl:    lidocaine (XYLOCAINE) 2 % solution, Use as directed 15 mLs in the mouth or throat every 6 (six) hours as needed for mouth pain., Disp: 100 mL, Rfl: 2   lidocaine-prilocaine (EMLA) cream,  Apply 1 application topically as needed., Disp: 30 g, Rfl: 0   ondansetron (ZOFRAN) 8 MG tablet, Take 1 tablet (8 mg total) by mouth every 8 (eight) hours as needed., Disp: 30 tablet, Rfl: 0   prochlorperazine (COMPAZINE) 10 MG tablet, Take 1 tablet (10 mg total) by mouth every 6 (six) hours as needed for nausea or vomiting., Disp: 30 tablet, Rfl: 0  Current Facility-Administered Medications:    0.9 %  sodium chloride infusion, , Intravenous, Once, Ledell Peoples IV, MD  PHYSICAL EXAM: ECOG FS:1 - Symptomatic but completely ambulatory    Vitals:   05/20/21 1123 05/20/21 1454  BP: (!) 151/72 138/78  Pulse: 91 70  Resp: 18 18  Temp: 98 F (36.7 C) 98.2 F (36.8 C)  TempSrc: Oral Oral  SpO2: 99% 98%  Weight: 179 lb 14.4 oz (81.6 kg)    Physical Exam Vitals and nursing note reviewed.  Constitutional:      General: He is not in acute distress.    Appearance: He is not ill-appearing.  HENT:     Head: Normocephalic and atraumatic.     Right Ear: Tympanic membrane and external ear normal.     Left Ear: Tympanic membrane and external ear normal.     Nose: Nose normal.     Mouth/Throat:     Mouth: Mucous membranes are dry.     Pharynx: Oropharynx is clear.     Comments: White coating on anterior tongue consistent with thrush.  No bleeding. No open sores. Eyes:     General: No scleral icterus.       Right eye: No discharge.        Left eye: No discharge.     Extraocular Movements: Extraocular movements intact.     Conjunctiva/sclera: Conjunctivae normal.     Pupils: Pupils are equal, round, and reactive to light.  Neck:     Vascular: No JVD.  Cardiovascular:     Rate and Rhythm: Normal rate and regular rhythm.     Pulses: Normal pulses.          Radial pulses are 2+ on the right side and 2+ on the left side.     Heart sounds: Normal heart sounds.  Pulmonary:     Comments: Lungs clear to auscultation in all fields. Symmetric chest rise. No wheezing, rales, or  rhonchi. Abdominal:     Comments: Abdomen is soft, non-distended, and non-tender in all quadrants. No rigidity, no guarding. No peritoneal signs.  Colostomy in left lower abdomen.  Unable to visualize stoma as he has a temporary brown bag for stool  Musculoskeletal:        General: Normal range of motion.     Cervical back: Normal range of motion.     Right lower leg: No edema.     Left lower leg: No edema.  Skin:    General: Skin is warm and dry.     Capillary Refill: Capillary refill takes less than 2 seconds.  Neurological:     Mental Status: He is oriented to person, place, and time.     GCS: GCS eye subscore is 4. GCS verbal subscore is 5. GCS motor subscore is 6.     Comments: Fluent speech, no facial droop.  Psychiatric:        Behavior: Behavior normal.      LABORATORY DATA: I have reviewed the data as listed CBC Latest Ref Rng & Units 05/20/2021 05/14/2021 04/30/2021  WBC 4.0 - 10.5 K/uL 2.5(L) 2.5(L) 4.0  Hemoglobin 13.0 - 17.0 g/dL 12.2(L) 11.9(L) 11.7(L)  Hematocrit 39.0 - 52.0 % 36.4(L) 35.0(L) 34.1(L)  Platelets 150 - 400 K/uL 164 148(L) 113(L)     CMP Latest Ref Rng & Units 05/20/2021 05/14/2021 04/30/2021  Glucose 70 - 99 mg/dL 433(H) 521(HH) 496(H)  BUN 8 - 23 mg/dL 9 8 8   Creatinine 0.61 - 1.24 mg/dL 1.06 1.03 1.05  Sodium 135 - 145 mmol/L 138 136 135  Potassium 3.5 - 5.1 mmol/L 3.1(L) 3.5 3.3(L)  Chloride 98 - 111 mmol/L 103 101 102  CO2 22 - 32 mmol/L 21(L) 23 23  Calcium 8.9 - 10.3 mg/dL 9.0 9.4 9.2  Total Protein 6.5 - 8.1 g/dL 7.0 7.0 6.8  Total Bilirubin 0.3 - 1.2 mg/dL  0.5 0.3 0.3  Alkaline Phos 38 - 126 U/L 88 133(H) 116  AST 15 - 41 U/L 22 26 17   ALT 0 - 44 U/L 22 29 17        RADIOGRAPHIC STUDIES: I have personally reviewed the radiological images as listed and agreed with the findings in the report. No images are attached to the encounter. No results found.   ASSESSMENT & PLAN: Patient is a 64 y.o. male with rectal adenocarcinoma  followed by oncologist Dr. Lorenso Courier currently on FOLFOX.   #Diarrhea-  CBC shows leukopenia 2.5 same as x 6 days ago, hemoglobin consistent with baseline. Normal platelets. ANC 1.4, slightly improved from 1.1 on recent labs.  CMP with mild hypokalemia 3.1,  no renal insufficiency. 1 L IVF given for dehydration. Patient ordered new colostomy supplies however they will not be available for a few weeks, will provide him with supplies here. Recommend imodium if diarrhea persists.  # hyperglycemia- Bicarb only slightly low at 21 and glucose elevated at 433. Patient given IVF for dehydration 2/2 high colostomy output as stated above which will also help lower glucose. He is not tolerating metformin. Unfortunately patient does not have a pcp at this time to assist with follow up. Will discontinue metformin and start on Glipizide. Dr. Lorenso Courier plans to reach out to endocrine for further work up of new onset diabetes. Patient knows to closely monitor blood sugar to watch for hypoglycemia. Patient feeling improved after IVF.  #Mouth sores-exam is consistent with thrush.  No signs of mucositis.  Will prescribe nystatin suspension. Discussed symptomatic care   #Rectal adenocarcinoma currently on FOLFOX cycle 6- continue treatment per primary oncologist Dr. Lorenso Courier.   Shared visit with oncologist Dr. Lorenso Courier.  Visit Diagnosis: 1. Rectal cancer ([place])   2. Diarrhea, unspecified type   3. Thrush, oral   4. Hyperglycemia      No orders of the defined types were placed in this encounter.   All questions were answered. The patient knows to call the clinic with any problems, questions or concerns. No barriers to learning was detected.  I have spent a total of 30 minutes minutes of face-to-face and non-face-to-face time, preparing to see the patient, obtaining and/or reviewing separately obtained history, performing a medically appropriate examination, counseling and educating the patient, ordering tests,   documenting clinical information in the electronic health record, and care coordination.     Thank you for allowing me to participate in the care of this patient.    Barrie Folk, PA-C Department of Hematology/Oncology [facility] at [facility] Phone: [phone]   05/20/2021 3:13 PM   I have read the above note and personally examined the patient. I agree with the assessment and plan as noted above.  Briefly Mr. Bethel is a 65 year old male with medical history significant for localized adenocarcinoma of the rectum who is currently undergoing neoadjuvant chemotherapy.  At his last visit he was noted to have markedly high glucose greater than 500.  He was sent to the emergency department where he received insulin, fluids, and was started on metformin.  Unfortunately metformin has caused him considerable diarrhea even after stopping the medication.  He presents today for evaluation of that diarrhea.  He has not taken his metformin over the last several days.  He notes that his ostomy bag came off due to the explosive nature of this diarrhea.  We requested that he stop the metformin therapy and we started glipizide.  Additionally we  will try to get him an appointment with endocrinology as he does not currently have a PCP.  I am also concerned that would not have a clear etiology for his diabetes.  His regimen does not contain any steroids or medications clearly known to cause hypoglycemia.   Ledell Peoples, MD Department of Hematology/Oncology [place] at [facility] Phone: [phone] Pager: [phone] Email: [email or web address]

## 2021-05-20 NOTE — Progress Notes (Signed)
Pt refused to use port today due to him not applying numbing cream and this nurse offered a ice pack as well. Pt requested to have blood drawn peripherally and only have an IV started on him today. Lisabeth Devoid. PA is aware.

## 2021-05-25 ENCOUNTER — Encounter: Payer: Self-pay | Admitting: Hematology and Oncology

## 2021-05-25 ENCOUNTER — Telehealth: Payer: Self-pay | Admitting: Endocrinology

## 2021-05-25 NOTE — Telephone Encounter (Signed)
-----   Message from Orson Slick, MD sent at 05/25/2021  9:26 AM EDT ----- Dr. Loanne Drilling,  I hope you are doing well.  I am reaching out to you because I have a patient who I am concerned about.  His name is [name] and he is a 65 year old gentleman who I am currently treating for localized rectal adenocarcinoma.  He is doing well with his chemotherapy regimen but unfortunately developed marked hyperglycemia.  His sugars recently began running over 500.  He has no medical history significant for diabetes and his regimen does not contain any steroids.  I am not sure why he has developed such high blood sugars.  He was recently started on metformin but was not able to tolerate that.  We did start him on some glipizide but I wanted him to be evaluated by a specialist for recommendations regarding treatment and evaluation for the possible cause of these markedly high sugars.  Would you be able to see this patient in the near future?  Thank you in advance for your help.  Best,  Armandina Gemma Hematology/Oncology

## 2021-05-27 ENCOUNTER — Inpatient Hospital Stay: Payer: Self-pay | Attending: Hematology and Oncology

## 2021-05-27 ENCOUNTER — Other Ambulatory Visit: Payer: Self-pay

## 2021-05-27 ENCOUNTER — Inpatient Hospital Stay: Payer: Self-pay

## 2021-05-27 ENCOUNTER — Inpatient Hospital Stay (HOSPITAL_BASED_OUTPATIENT_CLINIC_OR_DEPARTMENT_OTHER): Payer: Self-pay | Admitting: Hematology and Oncology

## 2021-05-27 ENCOUNTER — Ambulatory Visit: Payer: Self-pay | Admitting: Hematology and Oncology

## 2021-05-27 ENCOUNTER — Encounter: Payer: Self-pay | Admitting: Internal Medicine

## 2021-05-27 VITALS — BP 162/86 | HR 72 | Temp 96.0°F | Resp 17 | Wt 182.8 lb

## 2021-05-27 DIAGNOSIS — D696 Thrombocytopenia, unspecified: Secondary | ICD-10-CM | POA: Insufficient documentation

## 2021-05-27 DIAGNOSIS — F17210 Nicotine dependence, cigarettes, uncomplicated: Secondary | ICD-10-CM | POA: Insufficient documentation

## 2021-05-27 DIAGNOSIS — C20 Malignant neoplasm of rectum: Secondary | ICD-10-CM | POA: Insufficient documentation

## 2021-05-27 DIAGNOSIS — Z5111 Encounter for antineoplastic chemotherapy: Secondary | ICD-10-CM | POA: Insufficient documentation

## 2021-05-27 DIAGNOSIS — E876 Hypokalemia: Secondary | ICD-10-CM

## 2021-05-27 DIAGNOSIS — R109 Unspecified abdominal pain: Secondary | ICD-10-CM | POA: Insufficient documentation

## 2021-05-27 DIAGNOSIS — Z803 Family history of malignant neoplasm of breast: Secondary | ICD-10-CM | POA: Insufficient documentation

## 2021-05-27 DIAGNOSIS — R630 Anorexia: Secondary | ICD-10-CM | POA: Insufficient documentation

## 2021-05-27 DIAGNOSIS — D649 Anemia, unspecified: Secondary | ICD-10-CM | POA: Insufficient documentation

## 2021-05-27 DIAGNOSIS — Z79899 Other long term (current) drug therapy: Secondary | ICD-10-CM | POA: Insufficient documentation

## 2021-05-27 DIAGNOSIS — Z95828 Presence of other vascular implants and grafts: Secondary | ICD-10-CM

## 2021-05-27 DIAGNOSIS — D709 Neutropenia, unspecified: Secondary | ICD-10-CM | POA: Insufficient documentation

## 2021-05-27 DIAGNOSIS — R5383 Other fatigue: Secondary | ICD-10-CM | POA: Insufficient documentation

## 2021-05-27 DIAGNOSIS — R739 Hyperglycemia, unspecified: Secondary | ICD-10-CM | POA: Insufficient documentation

## 2021-05-27 DIAGNOSIS — Z8249 Family history of ischemic heart disease and other diseases of the circulatory system: Secondary | ICD-10-CM | POA: Insufficient documentation

## 2021-05-27 LAB — CBC WITH DIFFERENTIAL (CANCER CENTER ONLY)
Abs Immature Granulocytes: 0 10*3/uL (ref 0.00–0.07)
Basophils Absolute: 0 10*3/uL (ref 0.0–0.1)
Basophils Relative: 1 %
Eosinophils Absolute: 0 10*3/uL (ref 0.0–0.5)
Eosinophils Relative: 1 %
HCT: 32.3 % — ABNORMAL LOW (ref 39.0–52.0)
Hemoglobin: 10.8 g/dL — ABNORMAL LOW (ref 13.0–17.0)
Immature Granulocytes: 0 %
Lymphocytes Relative: 32 %
Lymphs Abs: 0.9 10*3/uL (ref 0.7–4.0)
MCH: 28.1 pg (ref 26.0–34.0)
MCHC: 33.4 g/dL (ref 30.0–36.0)
MCV: 84.1 fL (ref 80.0–100.0)
Monocytes Absolute: 0.4 10*3/uL (ref 0.1–1.0)
Monocytes Relative: 14 %
Neutro Abs: 1.6 10*3/uL — ABNORMAL LOW (ref 1.7–7.7)
Neutrophils Relative %: 52 %
Platelet Count: 105 10*3/uL — ABNORMAL LOW (ref 150–400)
RBC: 3.84 MIL/uL — ABNORMAL LOW (ref 4.22–5.81)
RDW: 17.4 % — ABNORMAL HIGH (ref 11.5–15.5)
WBC Count: 2.9 10*3/uL — ABNORMAL LOW (ref 4.0–10.5)
nRBC: 0 % (ref 0.0–0.2)

## 2021-05-27 LAB — CMP (CANCER CENTER ONLY)
ALT: 20 U/L (ref 0–44)
AST: 22 U/L (ref 15–41)
Albumin: 3.1 g/dL — ABNORMAL LOW (ref 3.5–5.0)
Alkaline Phosphatase: 94 U/L (ref 38–126)
Anion gap: 9 (ref 5–15)
BUN: 10 mg/dL (ref 8–23)
CO2: 26 mmol/L (ref 22–32)
Calcium: 8.8 mg/dL — ABNORMAL LOW (ref 8.9–10.3)
Chloride: 105 mmol/L (ref 98–111)
Creatinine: 0.89 mg/dL (ref 0.61–1.24)
GFR, Estimated: >60 mL/min (ref >60)
Glucose, Bld: 368 mg/dL — ABNORMAL HIGH (ref 70–99)
Potassium: 2.9 mmol/L — ABNORMAL LOW (ref 3.5–5.1)
Sodium: 140 mmol/L (ref 135–145)
Total Bilirubin: 0.3 mg/dL (ref 0.3–1.2)
Total Protein: 6.4 g/dL — ABNORMAL LOW (ref 6.5–8.1)

## 2021-05-27 MED ORDER — SODIUM CHLORIDE 0.9% FLUSH
10.0000 mL | Freq: Once | INTRAVENOUS | Status: AC
Start: 2021-05-27 — End: 2021-05-27
  Administered 2021-05-27: 10 mL

## 2021-05-27 MED ORDER — PALONOSETRON HCL INJECTION 0.25 MG/5ML
0.2500 mg | Freq: Once | INTRAVENOUS | Status: AC
  Administered 2021-05-27: 0.25 mg via INTRAVENOUS
  Filled 2021-05-27: qty 5

## 2021-05-27 MED ORDER — POTASSIUM CHLORIDE 10 MEQ/100ML IV SOLN
10.0000 meq | INTRAVENOUS | Status: AC
  Filled 2021-05-27: qty 100

## 2021-05-27 MED ORDER — SODIUM CHLORIDE 0.9 % IV SOLN
2400.0000 mg/m2 | INTRAVENOUS | Status: DC
  Administered 2021-05-27: 5000 mg via INTRAVENOUS
  Filled 2021-05-27: qty 100

## 2021-05-27 MED ORDER — SODIUM CHLORIDE 0.9% FLUSH: 10.0000 mL | INTRAVENOUS | Status: DC | PRN

## 2021-05-27 MED ORDER — DEXTROSE 5 % IV SOLN: Freq: Once | INTRAVENOUS | Status: AC

## 2021-05-27 MED ORDER — SODIUM CHLORIDE 0.9 % IV SOLN
10.0000 mg | Freq: Once | INTRAVENOUS | Status: AC
  Administered 2021-05-27: 10 mg via INTRAVENOUS
  Filled 2021-05-27: qty 10

## 2021-05-27 MED ORDER — POTASSIUM CHLORIDE 10 MEQ/100ML IV SOLN
10.0000 meq | INTRAVENOUS | Status: AC
  Administered 2021-05-27 (×2): 10 meq via INTRAVENOUS

## 2021-05-27 MED ORDER — LEUCOVORIN CALCIUM INJECTION 350 MG
400.0000 mg/m2 | Freq: Once | INTRAVENOUS | Status: AC
  Administered 2021-05-27: 836 mg via INTRAVENOUS
  Filled 2021-05-27: qty 41.8

## 2021-05-27 MED ORDER — OXALIPLATIN CHEMO INJECTION 100 MG/20ML
85.0000 mg/m2 | Freq: Once | INTRAVENOUS | Status: AC
  Administered 2021-05-27: 180 mg via INTRAVENOUS
  Filled 2021-05-27: qty 36

## 2021-05-27 MED ORDER — HEPARIN SOD (PORK) LOCK FLUSH 100 UNIT/ML IV SOLN: 500.0000 [IU] | Freq: Once | INTRAVENOUS | Status: DC | PRN

## 2021-05-27 MED ORDER — FLUOROURACIL CHEMO INJECTION 2.5 GM/50ML
400.0000 mg/m2 | Freq: Once | INTRAVENOUS | Status: AC
  Administered 2021-05-27: 850 mg via INTRAVENOUS
  Filled 2021-05-27: qty 17

## 2021-05-27 MED ORDER — DEXTROSE 5 % IV SOLN: Freq: Once | INTRAVENOUS | Status: DC

## 2021-05-27 NOTE — Progress Notes (Signed)
[place] Telephone:([phone]) [phone]   Fax:(336) [phone]  PROGRESS NOTE  Patient Care Team: Pcp, No as PCP - General Orson Slick, MD as Consulting Physician (Medical Oncology) Royston Bake, RN as Oncology Nurse Navigator (Oncology)  Hematological/Oncological History # Rectal Adenocarcinoma, T4N2M0. Stage IIIc 01/16/2021: presented to the emergency department with abdominal pain. CT abdomen/pelvis showed concern for mass lesion with perforation and surrounding adenopathy. 01/19/2021: repeat CT scan showed worsening inflammatory changes around the complex rectal process 01/21/2021: flexible sigmoidoscopy performed, lesion at 12 cm from the anal verge.  biopsy showed concern for adenocarcinoma. 01/21/2021: patient underwent a laparoscopic assisted loop colostomy with Dr. Georgette Dover. 02/09/2021: establish care with Dr. Lorenso Courier  03/05/2021: Cycle 1 Day 1 of neoadjuvant FOLFOX 03/18/2021: Cycle 2 Day 1 of neoadjuvant FOLFOX 04/02/2021: Cycle 3 Day 1 of neoadjuvant FOLFOX 04/15/2021: Cycle 4 Day 1 of neoadjuvant FOLFOX 04/30/2021: Cycle 5 Day 1 of neoadjuvant FOLFOX 05/14/2021: Cycle 6 Day 1 of neoadjuvant FOLFOX  Interval History:  Curtis [name] 65 y.o. male with medical history significant for rectal adenocarcinoma who presents for a follow up visit. The patient's last visit was on 05/14/2021. In the interim since the last visit he has completed Cycle 6 of neoadjuvant FOLFOX.   On exam today Curtis [name] reports he has been doing very well in the interim since his symptom management visit.  He reports that he has been eating well over the last 2 days and been drinking plenty, particular ice water.  He notes that his ostomy output has been moderate and he is taking Imodium which helps slow things down.  He is also having found a new supply of ostomy bags from medical supply store close to his home.  He is tolerating glipizide therapy quite well.  Overall he feels better and is happy back on  track.  He is willing and able to proceed with treatment at this time.  He otherwise denies any fevers, chills, sweats, nausea, vomiting or diarrhea.  A full 10 point ROS is listed below.  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[place])     SURGICAL HISTORY: Past Surgical History:  Procedure Laterality Date   BIOPSY  01/21/2021   Procedure: BIOPSY;  Surgeon: Mauri Pole, MD;  Location: [facility];  Service: Endoscopy;;   COLON RESECTION N/A 01/21/2021   Procedure: LAPAROSCOPIC ASSISTED LOOP COLOSTOMY;  Surgeon: Donnie Mesa, MD;  Location: [facility];  Service: General;  Laterality: N/A;   FLEXIBLE SIGMOIDOSCOPY N/A 01/21/2021   Procedure: FLEXIBLE SIGMOIDOSCOPY;  Surgeon: Mauri Pole, MD;  Location: [facility];  Service: Endoscopy;  Laterality: N/A;   IR IMAGING GUIDED PORT INSERTION  03/04/2021   SUBMUCOSAL TATTOO INJECTION  01/21/2021   Procedure: SUBMUCOSAL TATTOO INJECTION;  Surgeon: Mauri Pole, MD;  Location: [facility];  Service: Endoscopy;;    SOCIAL HISTORY: Social History   Socioeconomic History   Marital status: Divorced    Spouse name: Not on file   Number of children: Not on file   Years of education: Not on file   Highest education level: Not on file  Occupational History   Not on file  Tobacco Use   Smoking status: Every Day    Types: Cigarettes   Smokeless tobacco: Never  Vaping Use   Vaping Use: Never used  Substance and Sexual Activity   Alcohol use: Never   Drug use: Never   Sexual activity: Never  Other Topics Concern  Not on file  Social History Narrative   Not on file   Social Determinants of Health   Financial Resource Strain: Low Risk    Difficulty of Paying Living Expenses: Not very hard  Food Insecurity: No Food Insecurity   Worried About Running Out of Food in the Last Year: Never true   Ran Out of Food in the Last Year: Never true  Transportation Needs: No Transportation  Needs   Lack of Transportation (Medical): No   Lack of Transportation (Non-Medical): No  Physical Activity: Not on file  Stress: Not on file  Social Connections: Not on file  Intimate Partner Violence: Not At Risk   Fear of Current or Ex-Partner: No   Emotionally Abused: No   Physically Abused: No   Sexually Abused: No    FAMILY HISTORY: Family History  Problem Relation Age of Onset   Hypertension Mother    Hypertension Father    Breast cancer Sister     ALLERGIES:  has No Known Allergies.  MEDICATIONS:  Current Outpatient Medications  Medication Sig Dispense Refill   acetaminophen (TYLENOL) 500 MG tablet Take 1,000 mg by mouth daily.     glipiZIDE (GLUCOTROL) 5 MG tablet Take 1 tablet (5 mg total) by mouth daily before breakfast. 30 tablet 0   lidocaine (XYLOCAINE) 2 % solution Use as directed 15 mLs in the mouth or throat every 6 (six) hours as needed for mouth pain. 100 mL 2   lidocaine-prilocaine (EMLA) cream Apply 1 application topically as needed. 30 g 0   nystatin (MYCOSTATIN) 100000 UNIT/ML suspension Take 5 mLs (500,000 Units total) by mouth 4 (four) times daily for 7 days. 60 mL 0   ondansetron (ZOFRAN) 8 MG tablet Take 1 tablet (8 mg total) by mouth every 8 (eight) hours as needed. 30 tablet 0   prochlorperazine (COMPAZINE) 10 MG tablet Take 1 tablet (10 mg total) by mouth every 6 (six) hours as needed for nausea or vomiting. 30 tablet 0   No current facility-administered medications for this visit.   Facility-Administered Medications Ordered in Other Visits  Medication Dose Route Frequency Provider Last Rate Last Admin   potassium chloride 10 mEq in 100 mL IVPB  10 mEq Intravenous Q1 Hr x 2 Lorenso Courier, Othal Kubitz T IV, MD       potassium chloride 10 mEq in 100 mL IVPB  10 mEq Intravenous Q1 Hr x 2 Ledell Peoples IV, MD        REVIEW OF SYSTEMS:   Constitutional: ( - ) fevers, ( - )  chills , ( - ) night sweats Eyes: ( - ) blurriness of vision, ( - ) double vision, ( - )  watery eyes Ears, nose, mouth, throat, and face: ( - ) mucositis, ( - ) sore throat Respiratory: ( - ) cough, ( - ) dyspnea, ( - ) wheezes Cardiovascular: ( - ) palpitation, ( - ) chest discomfort, ( - ) lower extremity swelling Gastrointestinal:  ( - ) nausea, ( - ) heartburn, ( - ) change in bowel habits Skin: ( - ) abnormal skin rashes Lymphatics: ( - ) new lymphadenopathy, ( - ) easy bruising Neurological: ( - ) numbness, ( - ) tingling, ( - ) new weaknesses Behavioral/Psych: ( - ) mood change, ( - ) new changes  All other systems were reviewed with the patient and are negative.  PHYSICAL EXAMINATION: ECOG PERFORMANCE STATUS: 1 - Symptomatic but completely ambulatory  Vitals:   05/27/21 1038  BP: Marland Kitchen)  162/86  Pulse: 72  Resp: 17  Temp: (!) 96 F (35.6 C)  SpO2: 100%     Filed Weights   05/27/21 1038  Weight: 182 lb 12.8 oz (82.9 kg)     GENERAL: Well-appearing middle-aged [name], alert, no distress and comfortable SKIN: skin color, texture, turgor are normal, no rashes or significant lesions THROAT: mild ulcerations with no overt bleeding or evidence of infection.  EYES: conjunctiva are pink and non-injected, sclera clear LUNGS: clear to auscultation and percussion with normal breathing effort HEART: regular rate & rhythm and no murmurs and no lower extremity edema ABDOMEN: soft, non-tender, non-distended, normal bowel sounds.  Ostomy clean dry and intact  Musculoskeletal: no cyanosis of digits and no clubbing  PSYCH: alert & oriented x 3, fluent speech NEURO: no focal motor/sensory deficits  LABORATORY DATA:  I have reviewed the data as listed CBC Latest Ref Rng & Units 05/27/2021 05/20/2021 05/14/2021  WBC 4.0 - 10.5 K/uL 2.9(L) 2.5(L) 2.5(L)  Hemoglobin 13.0 - 17.0 g/dL 10.8(L) 12.2(L) 11.9(L)  Hematocrit 39.0 - 52.0 % 32.3(L) 36.4(L) 35.0(L)  Platelets 150 - 400 K/uL 105(L) 164 148(L)    CMP Latest Ref Rng & Units 05/27/2021 05/20/2021 05/14/2021   Glucose 70 - 99 mg/dL 368(H) 433(H) 521(HH)  BUN 8 - 23 mg/dL 10 9 8   Creatinine 0.61 - 1.24 mg/dL 0.89 1.06 1.03  Sodium 135 - 145 mmol/L 140 138 136  Potassium 3.5 - 5.1 mmol/L 2.9(L) 3.1(L) 3.5  Chloride 98 - 111 mmol/L 105 103 101  CO2 22 - 32 mmol/L 26 21(L) 23  Calcium 8.9 - 10.3 mg/dL 8.8(L) 9.0 9.4  Total Protein 6.5 - 8.1 g/dL 6.4(L) 7.0 7.0  Total Bilirubin 0.3 - 1.2 mg/dL 0.3 0.5 0.3  Alkaline Phos 38 - 126 U/L 94 88 133(H)  AST 15 - 41 U/L 22 22 26   ALT 0 - 44 U/L 20 22 29     RADIOGRAPHIC STUDIES: I have personally reviewed the radiological images as listed and agreed with the findings in the report: no evidence of metastatic diease in the chest. MRI abdomen does not show metastatic disease.   No results found.  ASSESSMENT & PLAN Curtis [name] 65 y.o. male with medical history significant for rectal adenocarcinoma who presents for a follow up visit.   At this time the patient's findings are most consistent with localized rectal adenocarcinoma.  MRI of the pelvis confirmed this is a T4 N2 M0 stage IIIc cancer.  As such we will plan to proceed with neoadjuvant chemotherapy, followed by chemoradiation, followed by reevaluation for consideration of transabdominal resection.  The initial treatment of choice would be neoadjuvant FOLFOX chemotherapy for 8 cycles.  Once this is complete we will transition to chemoradiation with p.o. capecitabine therapy.  When these are complete we will restage for consideration of surgical resection.  # Rectal Adenocarcinoma, T4N2M0 Stage IIIc --  staging with a CT scan of the chest and MRI abdomen/pelvis findings are consistent with localized disease.   --CEA modestly elevated at 17.44 on 02/09/2021 prior to start of therapy. --Given that this is adenocarcinoma of the rectum I would recommend proceeding with neoadjuvant chemotherapy, followed by chemoradiation, followed by definitive surgery. Case discussed at [facility] --Patient has established care  with Dr. Lisbeth Renshaw and radiation oncology. Plan: --today is Cycle 7 Day 1 of neoadjuvant FOLFOX  --assure CMP, CBC at each visit, and routine CEA levels --RTC in 2 weeks for Cycle 8  # Hyperglyclemia --BG elevated, patient has no  history of DM type II --BG 368 today, abnormal elevations since 04/15/2021  --currently on glipizide 5mg  PO daily --requesting referral to endocrine to help find the possible cause and begin treatment for this hyperglycemia --continue to monitor.   #Anemia #Thrombocyotpenia --mild, today Hgb noted at 10.8 --Plt increase to 105 today stable from prior cycle. --likely secondary to active malignancy and modest iron deficiency. Some contribution from chemotherapy as well.  --continue to monitor  #Supportive Care -- chemotherapy education complete -- port placed -- zofran 8mg  q8H PRN and compazine 10mg  PO q6H for nausea -- EMLA cream for port -- no pain medication required at this time.   No orders of the defined types were placed in this encounter.  All questions were answered. The patient knows to call the clinic with any problems, questions or concerns.  A total of more than 30 minutes were spent on this encounter with face-to-face time and non-face-to-face time, including preparing to see the patient, ordering tests and/or medications, counseling the patient and coordination of care as outlined above.   Ledell Peoples, MD Department of Hematology/Oncology [place] [facility] Phone: [phone] Pager: [phone] Email: [email or web address]  05/27/2021 12:40 PM

## 2021-05-27 NOTE — Patient Instructions (Signed)
[facility] ONCOL[facility]  Discharge Instructions: Thank you for choosing [facility] to provide your oncology and hematology care.   If you have a lab appointment with the [facility], please go directly to the [facility] and check in at the registration area.   Wear comfortable clothing and clothing appropriate for easy access to any Portacath or PICC line.   We strive to give you quality time with your provider. You may need to reschedule your appointment if you arrive late (15 or more minutes).  Arriving late affects you and other patients whose appointments are after yours.  Also, if you miss three or more appointments without notifying the office, you may be dismissed from the clinic at the provider's discretion.      For prescription refill requests, have your pharmacy contact our office and allow 72 hours for refills to be completed.    Today you received the following chemotherapy and/or immunotherapy agents Oxaliplatin/Leucovorin, 5FU      To help prevent nausea and vomiting after your treatment, we encourage you to take your nausea medication as directed.  BELOW ARE SYMPTOMS THAT SHOULD BE REPORTED IMMEDIATELY: *FEVER GREATER THAN 100.4 F (38 C) OR HIGHER *CHILLS OR SWEATING *NAUSEA AND VOMITING THAT IS NOT CONTROLLED WITH YOUR NAUSEA MEDICATION *UNUSUAL SHORTNESS OF BREATH *UNUSUAL BRUISING OR BLEEDING *URINARY PROBLEMS (pain or burning when urinating, or frequent urination) *BOWEL PROBLEMS (unusual diarrhea, constipation, pain near the anus) TENDERNESS IN MOUTH AND THROAT WITH OR WITHOUT PRESENCE OF ULCERS (sore throat, sores in mouth, or a toothache) UNUSUAL RASH, SWELLING OR PAIN  UNUSUAL VAGINAL DISCHARGE OR ITCHING   Items with * indicate a potential emergency and should be followed up as soon as possible or go to the Emergency Department if any problems should occur.  Please show the CHEMOTHERAPY ALERT CARD or IMMUNOTHERAPY ALERT  CARD at check-in to the Emergency Department and triage nurse.  Should you have questions after your visit or need to cancel or reschedule your appointment, please contact [facility]  Dept: [phone]  and follow the prompts.  Office hours are 8:00 a.m. to 4:30 p.m. Monday - Friday. Please note that voicemails left after 4:00 p.m. may not be returned until the following business day.  We are closed weekends and major holidays. You have access to a nurse at all times for urgent questions. Please call the main number to the clinic Dept: [phone] and follow the prompts.   For any non-urgent questions, you may also contact your provider using MyChart. We now offer e-Visits for anyone 39 and older to request care online for non-urgent symptoms. For details visit [email or web address].   Also download the MyChart app! Go to the app store, search "MyChart", open the app, select [facility], and log in with your MyChart username and password.  Due to Covid, a mask is required upon entering the hospital/clinic. If you do not have a mask, one will be given to you upon arrival. For doctor visits, patients may have 1 support person aged 37 or older with them. For treatment visits, patients cannot have anyone with them due to current Covid guidelines and our immunocompromised population.

## 2021-05-29 ENCOUNTER — Ambulatory Visit: Payer: Self-pay | Admitting: Internal Medicine

## 2021-05-29 ENCOUNTER — Inpatient Hospital Stay: Payer: Self-pay

## 2021-05-29 ENCOUNTER — Other Ambulatory Visit: Payer: Self-pay

## 2021-05-29 VITALS — BP 145/83 | HR 88 | Temp 98.6°F | Resp 16

## 2021-05-29 DIAGNOSIS — C20 Malignant neoplasm of rectum: Secondary | ICD-10-CM

## 2021-05-29 MED ORDER — HEPARIN SOD (PORK) LOCK FLUSH 100 UNIT/ML IV SOLN
500.0000 [IU] | Freq: Once | INTRAVENOUS | Status: AC | PRN
  Administered 2021-05-29: 500 [IU]

## 2021-05-29 MED ORDER — SODIUM CHLORIDE 0.9% FLUSH
10.0000 mL | INTRAVENOUS | Status: DC | PRN
  Administered 2021-05-29: 10 mL

## 2021-05-29 NOTE — Progress Notes (Deleted)
Patient ID: Curtis [name], male   DOB: Nov 24, 1955, 65 y.o.   MRN: [id number]  This visit occurred during the SARS-CoV-2 public health emergency.  Safety protocols were in place, including screening questions prior to the visit, additional usage of staff PPE, and extensive cleaning of exam room while observing appropriate contact time as indicated for disinfecting solutions.   HPI: Curtis [name] is a 65 y.o.-year-old male, referred by Dr. Lorenso Courier, for management of newly diagnosed DM in the setting of steroid use.  Patient does not have a history of diabetes, but was recently found to have very high blood sugars, in the 500s during chemotherapy (started 02/2021) rectal cancer (T4N2Mx) diagnosed in 12/2020.  He is receiving dexamethasone 10 mg with the chemotherapy sessions.  He was started on metformin but he could not tolerate it so he is now on glipizide low-dose once a day, after which sugars decreased, but they are still in the 200s.  Reviewed HbA1c: No results found for: HGBA1C  Pt is on a regimen of: -Glipizide 5 mg before breakfast He was not able to tolerate metformin.  Pt checks his sugars *** a day and they are: - am: n/c - 2h after b'fast: n/c - before lunch: n/c - 2h after lunch: n/c - before dinner: n/c - 2h after dinner: n/c - bedtime: n/c - nighttime: n/c Lowest sugar was ***; he has hypoglycemia awareness at 70.  Highest sugar was ***.  Glucometer:***  Pt's meals are: - Breakfast: - Lunch: - Dinner: - Snacks:  - no CKD, last BUN/creatinine:  Lab Results  Component Value Date   BUN 10 05/27/2021   BUN 9 05/20/2021   CREATININE 0.89 05/27/2021   CREATININE 1.06 05/20/2021   - last set of lipids: No results found for: CHOL, HDL, LDLCALC, LDLDIRECT, TRIG, CHOLHDL  - last eye exam was in ***. No DR.   - no numbness and tingling in his feet.  Pt has FH of DM in ***.  ROS: Constitutional: no weight gain, no weight loss, no fatigue, no subjective  hyperthermia, no subjective hypothermia, no nocturia Eyes: no blurry vision, no xerophthalmia ENT: no sore throat, no nodules palpated in neck, no dysphagia, no odynophagia, no hoarseness, no tinnitus, no hypoacusis Cardiovascular: no CP, no SOB, no palpitations, no leg swelling Respiratory: no cough, no SOB, no wheezing Gastrointestinal: no N, no V, no D, no C, no acid reflux Musculoskeletal: no muscle, no joint aches Skin: no rash, no hair loss Neurological: no tremors, no numbness or tingling/no dizziness/no HAs Psychiatric: no depression, no anxiety  PE: There were no vitals taken for this visit. Wt Readings from Last 3 Encounters:  05/27/21 182 lb 12.8 oz (82.9 kg)  05/20/21 179 lb 14.4 oz (81.6 kg)  05/14/21 183 lb 12.8 oz (83.4 kg)   Constitutional: normal weight, in NAD Eyes: PERRLA, EOMI, no exophthalmos ENT: moist mucous membranes, no thyromegaly, no cervical lymphadenopathy Cardiovascular: RRR, No MRG Respiratory: CTA B Gastrointestinal: abdomen soft, NT, ND, BS+ Musculoskeletal: no deformities, strength intact in all 4 Skin: moist, warm, no rashes Neurological: no tremor with outstretched hands, DTR normal in all 4  ASSESSMENT: 1.  Steroid-induced diabetes  PLAN:  1. Patient with newly diagnosed diabetes in the setting of steroid use along with chemotherapy for rectal cancer.  He could not tolerate metformin.  She was started on sulfonylurea, but he had persistent hyperglycemia.  Patient is seen on an urgent basis at the repeat request of Dr. Lorenso Courier. -  - I suggested  to:  There are no Patient Instructions on file for this visit. - Strongly advised him to start checking sugars at different times of the day - check 3x a day, rotating checks - discussed about CBG targets for treatment: 80-130 mg/dL before meals and <180 mg/dL after meals; target HbA1c <7%. - given sugar log and advised how to fill it and to bring it at next appt  - given foot care handout and explained  the principles  - given instructions for hypoglycemia management "15-15 rule"  - advised for yearly eye exams  - Return to clinic in 3 mo with sugar log   Philemon Kingdom, MD PhD [facility] Endocrinology

## 2021-05-29 NOTE — Patient Instructions (Signed)

## 2021-06-10 ENCOUNTER — Inpatient Hospital Stay: Payer: Self-pay

## 2021-06-10 ENCOUNTER — Inpatient Hospital Stay (HOSPITAL_BASED_OUTPATIENT_CLINIC_OR_DEPARTMENT_OTHER): Payer: Self-pay | Admitting: Physician Assistant

## 2021-06-10 ENCOUNTER — Other Ambulatory Visit: Payer: Self-pay | Admitting: Hematology

## 2021-06-10 ENCOUNTER — Other Ambulatory Visit: Payer: Self-pay

## 2021-06-10 VITALS — BP 133/81 | HR 93 | Temp 98.0°F | Resp 17 | Ht 70.0 in | Wt 177.8 lb

## 2021-06-10 DIAGNOSIS — T451X5A Adverse effect of antineoplastic and immunosuppressive drugs, initial encounter: Secondary | ICD-10-CM

## 2021-06-10 DIAGNOSIS — Z95828 Presence of other vascular implants and grafts: Secondary | ICD-10-CM

## 2021-06-10 DIAGNOSIS — D709 Neutropenia, unspecified: Secondary | ICD-10-CM | POA: Insufficient documentation

## 2021-06-10 DIAGNOSIS — D701 Agranulocytosis secondary to cancer chemotherapy: Secondary | ICD-10-CM

## 2021-06-10 DIAGNOSIS — E876 Hypokalemia: Secondary | ICD-10-CM

## 2021-06-10 DIAGNOSIS — C20 Malignant neoplasm of rectum: Secondary | ICD-10-CM

## 2021-06-10 LAB — CBC WITH DIFFERENTIAL (CANCER CENTER ONLY)
Abs Immature Granulocytes: 0 10*3/uL (ref 0.00–0.07)
Basophils Absolute: 0 10*3/uL (ref 0.0–0.1)
Basophils Relative: 1 %
Eosinophils Absolute: 0 10*3/uL (ref 0.0–0.5)
Eosinophils Relative: 2 %
HCT: 32.5 % — ABNORMAL LOW (ref 39.0–52.0)
Hemoglobin: 11.2 g/dL — ABNORMAL LOW (ref 13.0–17.0)
Immature Granulocytes: 0 %
Lymphocytes Relative: 44 %
Lymphs Abs: 0.8 10*3/uL (ref 0.7–4.0)
MCH: 29.1 pg (ref 26.0–34.0)
MCHC: 34.5 g/dL (ref 30.0–36.0)
MCV: 84.4 fL (ref 80.0–100.0)
Monocytes Absolute: 0.4 10*3/uL (ref 0.1–1.0)
Monocytes Relative: 21 %
Neutro Abs: 0.6 10*3/uL — ABNORMAL LOW (ref 1.7–7.7)
Neutrophils Relative %: 32 %
Platelet Count: 138 10*3/uL — ABNORMAL LOW (ref 150–400)
RBC: 3.85 MIL/uL — ABNORMAL LOW (ref 4.22–5.81)
RDW: 18.2 % — ABNORMAL HIGH (ref 11.5–15.5)
WBC Count: 1.9 10*3/uL — ABNORMAL LOW (ref 4.0–10.5)
nRBC: 0 % (ref 0.0–0.2)

## 2021-06-10 LAB — CMP (CANCER CENTER ONLY)
ALT: 18 U/L (ref 0–44)
AST: 24 U/L (ref 15–41)
Albumin: 3.2 g/dL — ABNORMAL LOW (ref 3.5–5.0)
Alkaline Phosphatase: 102 U/L (ref 38–126)
Anion gap: 11 (ref 5–15)
BUN: 8 mg/dL (ref 8–23)
CO2: 22 mmol/L (ref 22–32)
Calcium: 8.8 mg/dL — ABNORMAL LOW (ref 8.9–10.3)
Chloride: 106 mmol/L (ref 98–111)
Creatinine: 0.93 mg/dL (ref 0.61–1.24)
GFR, Estimated: >60 mL/min (ref >60)
Glucose, Bld: 381 mg/dL — ABNORMAL HIGH (ref 70–99)
Potassium: 3.2 mmol/L — ABNORMAL LOW (ref 3.5–5.1)
Sodium: 139 mmol/L (ref 135–145)
Total Bilirubin: 0.3 mg/dL (ref 0.3–1.2)
Total Protein: 6.9 g/dL (ref 6.5–8.1)

## 2021-06-10 MED ORDER — POTASSIUM CHLORIDE CRYS ER 20 MEQ PO TBCR
40.0000 meq | EXTENDED_RELEASE_TABLET | Freq: Once | ORAL | Status: AC
  Administered 2021-06-10: 40 meq via ORAL
  Filled 2021-06-10: qty 2

## 2021-06-10 MED ORDER — SODIUM CHLORIDE 0.9% FLUSH
10.0000 mL | Freq: Once | INTRAVENOUS | Status: AC
  Administered 2021-06-10: 10 mL

## 2021-06-10 MED ORDER — HEPARIN SOD (PORK) LOCK FLUSH 100 UNIT/ML IV SOLN
500.0000 [IU] | Freq: Once | INTRAVENOUS | Status: AC
Start: 2021-06-10 — End: 2021-06-10
  Administered 2021-06-10: 500 [IU]

## 2021-06-10 NOTE — Progress Notes (Signed)
[place] Telephone:([phone]) [phone]   [phone]  PROGRESS NOTE  Patient Care Team: Pcp, No as PCP - General Orson Slick, MD as Consulting Physician (Medical Oncology) Royston Bake, RN as Oncology Nurse Navigator (Oncology)  Hematological/Oncological History # Rectal Adenocarcinoma, T4N2M0. Stage IIIc 01/16/2021: presented to the emergency department with abdominal pain. CT abdomen/pelvis showed concern for mass lesion with perforation and surrounding adenopathy. 01/19/2021: repeat CT scan showed worsening inflammatory changes around the complex rectal process 01/21/2021: flexible sigmoidoscopy performed, lesion at 12 cm from the anal verge.  biopsy showed concern for adenocarcinoma. 01/21/2021: patient underwent a laparoscopic assisted loop colostomy with Dr. Georgette Dover. 02/09/2021: establish care with Dr. Lorenso Courier  03/05/2021: Cycle 1 Day 1 of neoadjuvant FOLFOX 03/18/2021: Cycle 2 Day 1 of neoadjuvant FOLFOX 04/02/2021: Cycle 3 Day 1 of neoadjuvant FOLFOX 04/15/2021: Cycle 4 Day 1 of neoadjuvant FOLFOX 04/30/2021: Cycle 5 Day 1 of neoadjuvant FOLFOX 05/14/2021: Cycle 6 Day 1 of neoadjuvant FOLFOX 05/27/2021: Cycle 7 Day 1 of neoadjuvant FOLFOX 06/10/2021: Cycle 8 Day 1 of neoadjuvant FOLFOX delayed due to neutropenia.   Interval History:  Curtis [name] 65 y.o. male with medical history significant for rectal adenocarcinoma who presents for a follow up visit. The patient's last visit was on 05/27/2021. In the interim since the last visit he has completed Cycle 7 of neoadjuvant FOLFOX.   On exam today Curtis [name] reports he is doing well since the last visit.  He reports appetite loss and fatigue for approximately 4 to 5 days following each treatment.  He continues to complete his daily activities on his own and tries to go for a daily walk.  He continues to have taste changes which affects his ability to eat but in the last 2 days, he has been eating more.  He denies any  nausea, vomiting or abdominal pain.  He has good output from his ostomy.  He notices loose stools he eats certain foods.  He takes Imodium as needed with improvement of symptoms.  He denies any hematochezia or melena.  He reports mild cold sensitivity.  He denies any fevers, chills, night sweats, shortness of breath, chest pain, cough, neuropathy, mucositis or rash. A full 10 point ROS is listed below.  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[place])     SURGICAL HISTORY: Past Surgical History:  Procedure Laterality Date   BIOPSY  01/21/2021   Procedure: BIOPSY;  Surgeon: Mauri Pole, MD;  Location: [facility];  Service: Endoscopy;;   COLON RESECTION N/A 01/21/2021   Procedure: LAPAROSCOPIC ASSISTED LOOP COLOSTOMY;  Surgeon: Donnie Mesa, MD;  Location: [facility];  Service: General;  Laterality: N/A;   FLEXIBLE SIGMOIDOSCOPY N/A 01/21/2021   Procedure: FLEXIBLE SIGMOIDOSCOPY;  Surgeon: Mauri Pole, MD;  Location: [facility];  Service: Endoscopy;  Laterality: N/A;   IR IMAGING GUIDED PORT INSERTION  03/04/2021   SUBMUCOSAL TATTOO INJECTION  01/21/2021   Procedure: SUBMUCOSAL TATTOO INJECTION;  Surgeon: Mauri Pole, MD;  Location: [facility];  Service: Endoscopy;;    SOCIAL HISTORY: Social History   Socioeconomic History   Marital status: Divorced    Spouse name: Not on file   Number of children: Not on file   Years of education: Not on file   Highest education level: Not on file  Occupational History   Not on file  Tobacco Use   Smoking status: Every Day    Types: Cigarettes   Smokeless tobacco:  Never  Vaping Use   Vaping Use: Never used  Substance and Sexual Activity   Alcohol use: Never   Drug use: Never   Sexual activity: Never  Other Topics Concern   Not on file  Social History Narrative   Not on file   Social Determinants of Health   Financial Resource Strain: Low Risk    Difficulty of Paying Living  Expenses: Not very hard  Food Insecurity: No Food Insecurity   Worried About Running Out of Food in the Last Year: Never true   Ran Out of Food in the Last Year: Never true  Transportation Needs: No Transportation Needs   Lack of Transportation (Medical): No   Lack of Transportation (Non-Medical): No  Physical Activity: Not on file  Stress: Not on file  Social Connections: Not on file  Intimate Partner Violence: Not At Risk   Fear of Current or Ex-Partner: No   Emotionally Abused: No   Physically Abused: No   Sexually Abused: No    FAMILY HISTORY: Family History  Problem Relation Age of Onset   Hypertension Mother    Hypertension Father    Breast cancer Sister     ALLERGIES:  has No Known Allergies.  MEDICATIONS:  Current Outpatient Medications  Medication Sig Dispense Refill   acetaminophen (TYLENOL) 500 MG tablet Take 1,000 mg by mouth daily.     glipiZIDE (GLUCOTROL) 5 MG tablet Take 1 tablet (5 mg total) by mouth daily before breakfast. 30 tablet 0   lidocaine (XYLOCAINE) 2 % solution Use as directed 15 mLs in the mouth or throat every 6 (six) hours as needed for mouth pain. 100 mL 2   lidocaine-prilocaine (EMLA) cream Apply 1 application topically as needed. 30 g 0   ondansetron (ZOFRAN) 8 MG tablet Take 1 tablet (8 mg total) by mouth every 8 (eight) hours as needed. (Patient not taking: Reported on 06/10/2021) 30 tablet 0   prochlorperazine (COMPAZINE) 10 MG tablet Take 1 tablet (10 mg total) by mouth every 6 (six) hours as needed for nausea or vomiting. (Patient not taking: Reported on 06/10/2021) 30 tablet 0   No current facility-administered medications for this visit.    REVIEW OF SYSTEMS:   Constitutional: ( - ) fevers, ( - )  chills , ( - ) night sweats Eyes: ( - ) blurriness of vision, ( - ) double vision, ( - ) watery eyes Ears, nose, mouth, throat, and face: ( - ) mucositis, ( - ) sore throat Respiratory: ( - ) cough, ( - ) dyspnea, ( - )  wheezes Cardiovascular: ( - ) palpitation, ( - ) chest discomfort, ( - ) lower extremity swelling Gastrointestinal:  ( - ) nausea, ( - ) heartburn, ( - ) change in bowel habits Skin: ( - ) abnormal skin rashes Lymphatics: ( - ) new lymphadenopathy, ( - ) easy bruising Neurological: ( - ) numbness, ( - ) tingling, ( - ) new weaknesses Behavioral/Psych: ( - ) mood change, ( - ) new changes  All other systems were reviewed with the patient and are negative.  PHYSICAL EXAMINATION: ECOG PERFORMANCE STATUS: 1 - Symptomatic but completely ambulatory  Vitals:   06/10/21 0820  BP: 133/81  Pulse: 93  Resp: 17  Temp: 98 F (36.7 C)  SpO2: 99%     Filed Weights   06/10/21 0820  Weight: 177 lb 12.8 oz (80.6 kg)     GENERAL: Well-appearing middle-aged [name], alert, no distress and comfortable SKIN:  skin color, texture, turgor are normal, no rashes or significant lesions THROAT: No mucositis. No overt bleeding or evidence of infection.  EYES: conjunctiva are pink and non-injected, sclera clear LUNGS: clear to auscultation and percussion with normal breathing effort HEART: regular rate & rhythm and no murmurs and no lower extremity edema ABDOMEN: soft, non-tender, non-distended, normal bowel sounds.  Ostomy clean dry and intact  Musculoskeletal: no cyanosis of digits and no clubbing  PSYCH: alert & oriented x 3, fluent speech NEURO: no focal motor/sensory deficits  LABORATORY DATA:  I have reviewed the data as listed CBC Latest Ref Rng & Units 06/10/2021 05/27/2021 05/20/2021  WBC 4.0 - 10.5 K/uL 1.9(L) 2.9(L) 2.5(L)  Hemoglobin 13.0 - 17.0 g/dL 11.2(L) 10.8(L) 12.2(L)  Hematocrit 39.0 - 52.0 % 32.5(L) 32.3(L) 36.4(L)  Platelets 150 - 400 K/uL 138(L) 105(L) 164    CMP Latest Ref Rng & Units 06/10/2021 05/27/2021 05/20/2021  Glucose 70 - 99 mg/dL 381(H) 368(H) 433(H)  BUN 8 - 23 mg/dL 8 10 9   Creatinine 0.61 - 1.24 mg/dL 0.93 0.89 1.06  Sodium 135 - 145 mmol/L 139 140 138   Potassium 3.5 - 5.1 mmol/L 3.2(L) 2.9(L) 3.1(L)  Chloride 98 - 111 mmol/L 106 105 103  CO2 22 - 32 mmol/L 22 26 21(L)  Calcium 8.9 - 10.3 mg/dL 8.8(L) 8.8(L) 9.0  Total Protein 6.5 - 8.1 g/dL 6.9 6.4(L) 7.0  Total Bilirubin 0.3 - 1.2 mg/dL 0.3 0.3 0.5  Alkaline Phos 38 - 126 U/L 102 94 88  AST 15 - 41 U/L 24 22 22   ALT 0 - 44 U/L 18 20 22     RADIOGRAPHIC STUDIES: I have personally reviewed the radiological images as listed and agreed with the findings in the report: no evidence of metastatic diease in the chest. MRI abdomen does not show metastatic disease.   No results found.  ASSESSMENT & PLAN Curtis [name] 65 y.o. male with medical history significant for rectal adenocarcinoma who presents for a follow up visit.   At this time the patient's findings are most consistent with localized rectal adenocarcinoma.  MRI of the pelvis confirmed this is a T4 N2 M0 stage IIIc cancer.  As such we will plan to proceed with neoadjuvant chemotherapy, followed by chemoradiation, followed by reevaluation for consideration of transabdominal resection.  The initial treatment of choice would be neoadjuvant FOLFOX chemotherapy for 8 cycles.  Once this is complete we will transition to chemoradiation with p.o. capecitabine therapy.  When these are complete we will restage for consideration of surgical resection.  # Rectal Adenocarcinoma, T4N2M0 Stage IIIc --  staging with a CT scan of the chest and MRI abdomen/pelvis findings are consistent with localized disease.   --CEA modestly elevated at 17.44 on 02/09/2021 prior to start of therapy. --Given that this is adenocarcinoma of the rectum I would recommend proceeding with neoadjuvant chemotherapy, followed by chemoradiation, followed by definitive surgery. Case discussed at [facility] --Patient has established care with Dr. Lisbeth Renshaw and radiation oncology. Plan: --Labs today were reviewed. Due to neutropenia with ANC of 600, we will delay treatment. Patient would like  to reschedule treatment after thanksgiving.  --Need to follow up with Dr. Lisbeth Renshaw to plan for chemoradiation --Need c-scope via colostomy before surgery as recommended by Dr. Hester Mates ([facility]). I have reached out to Dr. Silverio Decamp (GI) for a follow up.  --RTC the week of 06/22/2021 prior to Cycle 8, Day 1.   # Hyperglyclemia --BG elevated, patient has no history of DM type  II --BG 381 today, abnormal elevations since 04/15/2021  --currently on glipizide 5mg  PO daily --Awaiting consultation with endocrine to help find the possible cause and begin treatment for this hyperglycemia --continue to monitor.   # Neutropenia: --ANC is 600 today, secondary to chemotherapy --Delay chemotherapy today, reschedule cycle 8 after thanksgiving. --Provided strict precautions including monitor for fevers and chills.   #Anemia #Thrombocyotpenia --Hgb noted at 11.2, improved from prior cycle.  --Plt 138K today, improved from prior cycle. --likely secondary to active malignancy and modest iron deficiency. Some contribution from chemotherapy as well.  --continue to monitor  #Hypokalemia: --Today's potassium is 3.2. --Gave 40 mEq of oral potassium today  #Supportive Care -- chemotherapy education complete -- port placed -- zofran 8mg  q8H PRN and compazine 10mg  PO q6H for nausea -- EMLA cream for port -- no pain medication required at this time.   No orders of the defined types were placed in this encounter.  All questions were answered. The patient knows to call the clinic with any problems, questions or concerns.  I have spent a total of 30 minutes minutes of face-to-face and non-face-to-face time, preparing to see the patient, obtaining and/or reviewing separately obtained history, performing a medically appropriate examination, counseling and educating the patient, ordering medications,  communicating with other health care professionals, documenting clinical information in the electronic health  record, and care coordination.    Lincoln Brigham, PA-C Department of Hematology/Oncology [place] at [facility] Phone: [phone]  06/10/2021 9:47 AM

## 2021-06-10 NOTE — Patient Instructions (Signed)
Hypokalemia Hypokalemia means that the amount of potassium in the blood is lower than normal. Potassium is a chemical (electrolyte) that helps regulate the amount of fluid in the body. It also stimulates muscle tightening (contraction) and helps nerves work properly. Normally, most of the body's potassium is inside cells, and only a very small amount is in the blood. Because the amount in the blood is so small, minor changes to potassium levels in the blood can be life-threatening. What are the causes? This condition may be caused by: Antibiotic medicine. Diarrhea or vomiting. Taking too much of a medicine that helps you have a bowel movement (laxative) can cause diarrhea and lead to hypokalemia. Chronic kidney disease (CKD). Medicines that help the body get rid of excess fluid (diuretics). Eating disorders, such as bulimia. Low magnesium levels in the body. Sweating a lot. What are the signs or symptoms? Symptoms of this condition include: Weakness. Constipation. Fatigue. Muscle cramps. Mental confusion. Skipped heartbeats or irregular heartbeat (palpitations). Tingling or numbness. How is this diagnosed? This condition is diagnosed with a blood test. How is this treated? This condition may be treated by: Taking potassium supplements by mouth. Adjusting the medicines that you take. Eating more foods that contain a lot of potassium. If your potassium level is very low, you may need to get potassium through an IV and be monitored in the hospital. Follow these instructions at home:  Take over-the-counter and prescription medicines only as told by your health care provider. This includes vitamins and supplements. Eat a healthy diet. A healthy diet includes fresh fruits and vegetables, whole grains, healthy fats, and lean proteins. If instructed, eat more foods that contain a lot of potassium. This includes: Nuts, such as peanuts and pistachios. Seeds, such as sunflower seeds and  pumpkin seeds. Peas, lentils, and lima beans. Whole grain and bran cereals and breads. Fresh fruits and vegetables, such as apricots, avocado, bananas, cantaloupe, kiwi, oranges, tomatoes, asparagus, and potatoes. Orange juice. Tomato juice. Red meats. Yogurt. Keep all follow-up visits as told by your health care provider. This is important. Contact a health care provider if you: Have weakness that gets worse. Feel your heart pounding or racing. Vomit. Have diarrhea. Have diabetes (diabetes mellitus) and you have trouble keeping your blood sugar (glucose) in your target range. Get help right away if you: Have chest pain. Have shortness of breath. Have vomiting or diarrhea that lasts for more than 2 days. Faint. Summary Hypokalemia means that the amount of potassium in the blood is lower than normal. This condition is diagnosed with a blood test. Hypokalemia may be treated by taking potassium supplements, adjusting the medicines that you take, or eating more foods that are high in potassium. If your potassium level is very low, you may need to get potassium through an IV and be monitored in the hospital. This information is not intended to replace advice given to you by your health care provider. Make sure you discuss any questions you have with your health care provider. Document Revised: 02/21/2018 Document Reviewed: 02/22/2018 Elsevier Patient Education  2022 Elsevier Inc.  

## 2021-06-10 NOTE — Progress Notes (Signed)
Per Dede Query, no chemotherapy treatment today.

## 2021-06-12 ENCOUNTER — Telehealth: Payer: Self-pay | Admitting: Hematology and Oncology

## 2021-06-12 NOTE — Telephone Encounter (Signed)
Scheduled per los, patient has been called and notified. 

## 2021-06-13 ENCOUNTER — Other Ambulatory Visit: Payer: Self-pay | Admitting: Hematology and Oncology

## 2021-06-13 ENCOUNTER — Other Ambulatory Visit: Payer: Self-pay | Admitting: Physician Assistant

## 2021-06-15 ENCOUNTER — Telehealth: Payer: Self-pay

## 2021-06-15 ENCOUNTER — Other Ambulatory Visit: Payer: Self-pay | Admitting: Hematology and Oncology

## 2021-06-15 ENCOUNTER — Encounter: Payer: Self-pay | Admitting: Hematology and Oncology

## 2021-06-15 MED ORDER — NYSTATIN 100000 UNIT/ML MT SUSP: OROMUCOSAL | 0 refills | Status: DC

## 2021-06-15 MED ORDER — GLIPIZIDE 5 MG PO TABS
5.0000 mg | ORAL_TABLET | Freq: Every day | ORAL | 1 refills | Status: DC
Start: 2021-06-15 — End: 2021-09-09

## 2021-06-15 NOTE — Telephone Encounter (Signed)
Nutrition Assessment   Reason for Assessment:   Patient identified on Malnutrition Screening report for poor appetite and weight loss   ASSESSMENT:  65 year old male with rectal adenocarcinoma.  Past medical history of loop colostomy on 01/21/21.  Patient receiving folfox but held last treatment.    Spoke with patient via phone and introduced self and service at [facility].  Patient reports that since did not receive treatment appetite has increased and taste has returned about 65%.  "I am eating like crazy."  Patient eating rice, shrimp, chicken, fruit mostly.  "I know I have gained weight."     Medications: glipizide, zofran, compazine   Labs: reviewed   Anthropometrics:   Height: 70 inches Weight: 177 lb 12.8 oz on 11/16 194 lb 11.2 oz on 8/24 BMI: 25  9% weight loss in the last 3 months    NUTRITION DIAGNOSIS: Unintentional weight loss related to cancer related treatment side effects as evidenced by 9% weight loss   INTERVENTION:  Patient requesting refills of glipizide and nystatin. Message sent to MD and nursing with this request.   Encouraged patient to continue eating well balanced diet with good sources of protein.   Contact information provided and encouraged patient to contact RD with changes in nutritional status.   Next Visit: no follow-up Patient to contact RD if needed  Mayu Ronk B. Zenia Resides, [place], [place] Registered Dietitian [phone] (mobile)

## 2021-06-17 ENCOUNTER — Ambulatory Visit: Payer: Self-pay | Admitting: Hematology and Oncology

## 2021-06-17 ENCOUNTER — Telehealth: Payer: Self-pay | Admitting: Hematology and Oncology

## 2021-06-17 NOTE — Telephone Encounter (Signed)
Scheduled per 11/06 los, patient has been called and voicemail was left.

## 2021-06-22 ENCOUNTER — Inpatient Hospital Stay (HOSPITAL_BASED_OUTPATIENT_CLINIC_OR_DEPARTMENT_OTHER): Payer: Self-pay | Admitting: Hematology and Oncology

## 2021-06-22 ENCOUNTER — Other Ambulatory Visit: Payer: Self-pay

## 2021-06-22 ENCOUNTER — Inpatient Hospital Stay: Payer: Self-pay

## 2021-06-22 ENCOUNTER — Ambulatory Visit: Payer: Self-pay

## 2021-06-22 VITALS — BP 163/68 | HR 62 | Temp 98.8°F | Resp 20

## 2021-06-22 VITALS — BP 133/72 | HR 65 | Temp 98.2°F | Resp 17 | Wt 185.1 lb

## 2021-06-22 DIAGNOSIS — C20 Malignant neoplasm of rectum: Secondary | ICD-10-CM

## 2021-06-22 DIAGNOSIS — Z95828 Presence of other vascular implants and grafts: Secondary | ICD-10-CM

## 2021-06-22 DIAGNOSIS — D701 Agranulocytosis secondary to cancer chemotherapy: Secondary | ICD-10-CM

## 2021-06-22 DIAGNOSIS — T451X5A Adverse effect of antineoplastic and immunosuppressive drugs, initial encounter: Secondary | ICD-10-CM

## 2021-06-22 LAB — CBC WITH DIFFERENTIAL (CANCER CENTER ONLY)
Abs Immature Granulocytes: 0.05 10*3/uL (ref 0.00–0.07)
Basophils Absolute: 0.1 10*3/uL (ref 0.0–0.1)
Basophils Relative: 1 %
Eosinophils Absolute: 0.1 10*3/uL (ref 0.0–0.5)
Eosinophils Relative: 2 %
HCT: 30.9 % — ABNORMAL LOW (ref 39.0–52.0)
Hemoglobin: 10.3 g/dL — ABNORMAL LOW (ref 13.0–17.0)
Immature Granulocytes: 1 %
Lymphocytes Relative: 25 %
Lymphs Abs: 2 10*3/uL (ref 0.7–4.0)
MCH: 28.9 pg (ref 26.0–34.0)
MCHC: 33.3 g/dL (ref 30.0–36.0)
MCV: 86.6 fL (ref 80.0–100.0)
Monocytes Absolute: 0.6 10*3/uL (ref 0.1–1.0)
Monocytes Relative: 7 %
Neutro Abs: 5.1 10*3/uL (ref 1.7–7.7)
Neutrophils Relative %: 64 %
Platelet Count: 191 10*3/uL (ref 150–400)
RBC: 3.57 MIL/uL — ABNORMAL LOW (ref 4.22–5.81)
RDW: 18.6 % — ABNORMAL HIGH (ref 11.5–15.5)
WBC Count: 7.9 10*3/uL (ref 4.0–10.5)
nRBC: 0 % (ref 0.0–0.2)

## 2021-06-22 LAB — CMP (CANCER CENTER ONLY)
ALT: 13 U/L (ref 0–44)
AST: 19 U/L (ref 15–41)
Albumin: 3.1 g/dL — ABNORMAL LOW (ref 3.5–5.0)
Alkaline Phosphatase: 100 U/L (ref 38–126)
Anion gap: 9 (ref 5–15)
BUN: 16 mg/dL (ref 8–23)
CO2: 25 mmol/L (ref 22–32)
Calcium: 8.6 mg/dL — ABNORMAL LOW (ref 8.9–10.3)
Chloride: 107 mmol/L (ref 98–111)
Creatinine: 1.1 mg/dL (ref 0.61–1.24)
GFR, Estimated: >60 mL/min (ref >60)
Glucose, Bld: 100 mg/dL — ABNORMAL HIGH (ref 70–99)
Potassium: 3.1 mmol/L — ABNORMAL LOW (ref 3.5–5.1)
Sodium: 141 mmol/L (ref 135–145)
Total Bilirubin: 0.4 mg/dL (ref 0.3–1.2)
Total Protein: 6.7 g/dL (ref 6.5–8.1)

## 2021-06-22 MED ORDER — SODIUM CHLORIDE 0.9% FLUSH
10.0000 mL | Freq: Once | INTRAVENOUS | Status: AC
  Administered 2021-06-22: 12:00:00 10 mL

## 2021-06-22 MED ORDER — SODIUM CHLORIDE 0.9 % IV SOLN
10.0000 mg | Freq: Once | INTRAVENOUS | Status: AC
  Administered 2021-06-22: 14:00:00 10 mg via INTRAVENOUS
  Filled 2021-06-22: qty 10

## 2021-06-22 MED ORDER — SODIUM CHLORIDE 0.9 % IV SOLN: Freq: Once | INTRAVENOUS | Status: DC | PRN

## 2021-06-22 MED ORDER — LEUCOVORIN CALCIUM INJECTION 350 MG
400.0000 mg/m2 | Freq: Once | INTRAVENOUS | Status: AC
  Administered 2021-06-22: 14:00:00 836 mg via INTRAVENOUS
  Filled 2021-06-22: qty 25

## 2021-06-22 MED ORDER — POTASSIUM CHLORIDE CRYS ER 20 MEQ PO TBCR: 20.0000 meq | EXTENDED_RELEASE_TABLET | Freq: Two times a day (BID) | ORAL | 0 refills | Status: DC

## 2021-06-22 MED ORDER — FLUOROURACIL CHEMO INJECTION 2.5 GM/50ML
400.0000 mg/m2 | Freq: Once | INTRAVENOUS | Status: AC
  Administered 2021-06-22: 17:00:00 850 mg via INTRAVENOUS
  Filled 2021-06-22: qty 17

## 2021-06-22 MED ORDER — DEXTROSE 5 % IV SOLN: Freq: Once | INTRAVENOUS | Status: AC

## 2021-06-22 MED ORDER — PALONOSETRON HCL INJECTION 0.25 MG/5ML
0.2500 mg | Freq: Once | INTRAVENOUS | Status: AC
  Administered 2021-06-22: 14:00:00 0.25 mg via INTRAVENOUS
  Filled 2021-06-22: qty 5

## 2021-06-22 MED ORDER — DIPHENHYDRAMINE HCL 50 MG/ML IJ SOLN
50.0000 mg | Freq: Once | INTRAMUSCULAR | Status: AC | PRN
  Administered 2021-06-22: 15:00:00 50 mg via INTRAVENOUS

## 2021-06-22 MED ORDER — SODIUM CHLORIDE 0.9 % IV SOLN
2400.0000 mg/m2 | INTRAVENOUS | Status: DC
  Administered 2021-06-22: 18:00:00 5000 mg via INTRAVENOUS
  Filled 2021-06-22: qty 100

## 2021-06-22 MED ORDER — OXALIPLATIN CHEMO INJECTION 100 MG/20ML
85.0000 mg/m2 | Freq: Once | INTRAVENOUS | Status: AC
  Administered 2021-06-22: 180 mg via INTRAVENOUS
  Filled 2021-06-22: qty 36

## 2021-06-22 MED ORDER — METHYLPREDNISOLONE SODIUM SUCC 125 MG IJ SOLR
125.0000 mg | Freq: Once | INTRAMUSCULAR | Status: AC | PRN
  Administered 2021-06-22: 15:00:00 125 mg via INTRAVENOUS

## 2021-06-22 NOTE — Progress Notes (Signed)
Oxaliplatin and Leucovorin restarted at 1545.   VSS.  Pt was monitored closely.  Pt tolerated infusions uneventful.   Care transferred to [facility], RN for continuity of care. Pt was stable at time of this nurse departure.

## 2021-06-22 NOTE — Progress Notes (Signed)
40 minutes into treatment with Oxaliplatin and Leucovorin pt. complained of itching "all over his body". Chemo stopped, Normal saline started, and emergency protocol initiated. Pt. had red, raised rash noted to anterior upper chest, upper back and lower abdominal area. No respiratory distress noted.  Dr. Lorenso Courier notified and in for assessment. Medicated with Benadryl 50 mg IV and Solumedrol 125 mg IV. Plan is to restart chemotherapy after a 20 minute wait.

## 2021-06-22 NOTE — Progress Notes (Signed)
Curtis [name]   [phone]  PROGRESS NOTE  Patient Care Team: Pcp, No as PCP - General Orson Slick, MD as Consulting Physician (Medical Oncology) Royston Bake, RN as Oncology Nurse Navigator (Oncology)  Hematological/Oncological History # Rectal Adenocarcinoma, T4N2M0. Stage IIIc 01/16/2021: presented to the emergency department with abdominal pain. CT abdomen/pelvis showed concern for mass lesion with perforation and surrounding adenopathy. 01/19/2021: repeat CT scan showed worsening inflammatory changes around the complex rectal process 01/21/2021: flexible sigmoidoscopy performed, lesion at 12 cm from the anal verge.  biopsy showed concern for adenocarcinoma. 01/21/2021: patient underwent a laparoscopic assisted loop colostomy with Dr. Georgette Dover. 02/09/2021: establish care with Dr. Lorenso Courier  03/05/2021: Cycle 1 Day 1 of neoadjuvant FOLFOX 03/18/2021: Cycle 2 Day 1 of neoadjuvant FOLFOX 04/02/2021: Cycle 3 Day 1 of neoadjuvant FOLFOX 04/15/2021: Cycle 4 Day 1 of neoadjuvant FOLFOX 04/30/2021: Cycle 5 Day 1 of neoadjuvant FOLFOX 05/14/2021: Cycle 6 Day 1 of neoadjuvant FOLFOX 05/27/2021: Cycle 7 Day 1 of neoadjuvant FOLFOX 06/10/2021: Cycle 8 Day 1 of neoadjuvant FOLFOX delayed due to neutropenia.  06/22/2021: Cycle 8 Day 1 of neoadjuvant FOLFOX  Interval History:  Curtis [name] 65 y.o. male with medical history significant for rectal adenocarcinoma who presents for a follow up visit. The patient's last visit was on 05/27/2021. In the interim since the last visit he has completed Cycle 7 of neoadjuvant FOLFOX.   On exam today [name] reports he has gained approximately 8 pounds in the interim since her last visit.  He notes that his taste is improving and he is able to tolerate cold much better than he was before.  He unfortunately did have a slip and fall in an asphalt parking lot where he scraped his hand.  He otherwise has been quite well and  had a good Thanksgiving meal where he was able to eat to his heart content.  He takes Imodium as needed with improvement of symptoms.  He denies any hematochezia or melena.  He denies any fevers, chills, night sweats, shortness of breath, chest pain, cough, neuropathy, mucositis or rash. A full 10 point ROS is listed below.  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[place])     SURGICAL HISTORY: Past Surgical History:  Procedure Laterality Date   BIOPSY  01/21/2021   Procedure: BIOPSY;  Surgeon: Mauri Pole, MD;  Location: [facility];  Service: Endoscopy;;   COLON RESECTION N/A 01/21/2021   Procedure: LAPAROSCOPIC ASSISTED LOOP COLOSTOMY;  Surgeon: Donnie Mesa, MD;  Location: [facility];  Service: General;  Laterality: N/A;   FLEXIBLE SIGMOIDOSCOPY N/A 01/21/2021   Procedure: FLEXIBLE SIGMOIDOSCOPY;  Surgeon: Mauri Pole, MD;  Location: [facility];  Service: Endoscopy;  Laterality: N/A;   IR IMAGING GUIDED PORT INSERTION  03/04/2021   SUBMUCOSAL TATTOO INJECTION  01/21/2021   Procedure: SUBMUCOSAL TATTOO INJECTION;  Surgeon: Mauri Pole, MD;  Location: [facility];  Service: Endoscopy;;    SOCIAL HISTORY: Social History   Socioeconomic History   Marital status: Divorced    Spouse name: Not on file   Number of children: Not on file   Years of education: Not on file   Highest education level: Not on file  Occupational History   Not on file  Tobacco Use   Smoking status: Every Day    Types: Cigarettes   Smokeless tobacco: Never  Vaping Use   Vaping Use: Never used  Substance and Sexual Activity  Alcohol use: Never   Drug use: Never   Sexual activity: Never  Other Topics Concern   Not on file  Social History Narrative   Not on file   Social Determinants of Health   Financial Resource Strain: Low Risk    Difficulty of Paying Living Expenses: Not very hard  Food Insecurity: No Food Insecurity   Worried About  Running Out of Food in the Last Year: Never true   Ran Out of Food in the Last Year: Never true  Transportation Needs: No Transportation Needs   Lack of Transportation (Medical): No   Lack of Transportation (Non-Medical): No  Physical Activity: Not on file  Stress: Not on file  Social Connections: Not on file  Intimate Partner Violence: Not At Risk   Fear of Current or Ex-Partner: No   Emotionally Abused: No   Physically Abused: No   Sexually Abused: No    FAMILY HISTORY: Family History  Problem Relation Age of Onset   Hypertension Mother    Hypertension Father    Breast cancer Sister     ALLERGIES:  is allergic to leucovorin and oxaliplatin.  MEDICATIONS:  Current Outpatient Medications  Medication Sig Dispense Refill   potassium chloride SA (KLOR-CON) 20 MEQ tablet Take 1 tablet (20 mEq total) by mouth 2 (two) times daily. 30 tablet 0   acetaminophen (TYLENOL) 500 MG tablet Take 1,000 mg by mouth daily.     glipiZIDE (GLUCOTROL) 5 MG tablet Take 1 tablet (5 mg total) by mouth daily before breakfast. 30 tablet 1   lidocaine (XYLOCAINE) 2 % solution Use as directed 15 mLs in the mouth or throat every 6 (six) hours as needed for mouth pain. 100 mL 2   lidocaine-prilocaine (EMLA) cream Apply 1 application topically as needed. 30 g 0   nystatin (MYCOSTATIN) 100000 UNIT/ML suspension TAKE 5 ML BY MOUTH  4 TIMES DAILY FOR 7 DAYS 140 mL 0   ondansetron (ZOFRAN) 8 MG tablet Take 1 tablet (8 mg total) by mouth every 8 (eight) hours as needed. (Patient not taking: Reported on 06/10/2021) 30 tablet 0   prochlorperazine (COMPAZINE) 10 MG tablet Take 1 tablet (10 mg total) by mouth every 6 (six) hours as needed for nausea or vomiting. (Patient not taking: Reported on 06/10/2021) 30 tablet 0   No current facility-administered medications for this visit.    REVIEW OF SYSTEMS:   Constitutional: ( - ) fevers, ( - )  chills , ( - ) night sweats Eyes: ( - ) blurriness of vision, ( - ) double  vision, ( - ) watery eyes Ears, nose, mouth, throat, and face: ( - ) mucositis, ( - ) sore throat Respiratory: ( - ) cough, ( - ) dyspnea, ( - ) wheezes Cardiovascular: ( - ) palpitation, ( - ) chest discomfort, ( - ) lower extremity swelling Gastrointestinal:  ( - ) nausea, ( - ) heartburn, ( - ) change in bowel habits Skin: ( - ) abnormal skin rashes Lymphatics: ( - ) new lymphadenopathy, ( - ) easy bruising Neurological: ( - ) numbness, ( - ) tingling, ( - ) new weaknesses Behavioral/Psych: ( - ) mood change, ( - ) new changes  All other systems were reviewed with the patient and are negative.  PHYSICAL EXAMINATION: ECOG PERFORMANCE STATUS: 1 - Symptomatic but completely ambulatory  Vitals:   06/22/21 1225  BP: 133/72  Pulse: 65  Resp: 17  Temp: 98.2 F (36.8 C)  SpO2: 99%  Filed Weights   06/22/21 1225  Weight: 185 lb 1.6 oz (84 kg)     GENERAL: Well-appearing middle-aged [name], alert, no distress and comfortable SKIN: skin color, texture, turgor are normal, no rashes or significant lesions THROAT: No mucositis. No overt bleeding or evidence of infection.  EYES: conjunctiva are pink and non-injected, sclera clear LUNGS: clear to auscultation and percussion with normal breathing effort HEART: regular rate & rhythm and no murmurs and no lower extremity edema ABDOMEN: soft, non-tender, non-distended, normal bowel sounds.  Ostomy clean dry and intact  Musculoskeletal: no cyanosis of digits and no clubbing  PSYCH: alert & oriented x 3, fluent speech NEURO: no focal motor/sensory deficits  LABORATORY DATA:  I have reviewed the data as listed CBC Latest Ref Rng & Units 06/22/2021 06/10/2021 05/27/2021  WBC 4.0 - 10.5 K/uL 7.9 1.9(L) 2.9(L)  Hemoglobin 13.0 - 17.0 g/dL 10.3(L) 11.2(L) 10.8(L)  Hematocrit 39.0 - 52.0 % 30.9(L) 32.5(L) 32.3(L)  Platelets 150 - 400 K/uL 191 138(L) 105(L)    CMP Latest Ref Rng & Units 06/22/2021 06/10/2021 05/27/2021  Glucose 70 -  99 mg/dL 100(H) 381(H) 368(H)  BUN 8 - 23 mg/dL 16 8 10   Creatinine 0.61 - 1.24 mg/dL 1.10 0.93 0.89  Sodium 135 - 145 mmol/L 141 139 140  Potassium 3.5 - 5.1 mmol/L 3.1(L) 3.2(L) 2.9(L)  Chloride 98 - 111 mmol/L 107 106 105  CO2 22 - 32 mmol/L 25 22 26   Calcium 8.9 - 10.3 mg/dL 8.6(L) 8.8(L) 8.8(L)  Total Protein 6.5 - 8.1 g/dL 6.7 6.9 6.4(L)  Total Bilirubin 0.3 - 1.2 mg/dL 0.4 0.3 0.3  Alkaline Phos 38 - 126 U/L 100 102 94  AST 15 - 41 U/L 19 24 22   ALT 0 - 44 U/L 13 18 20     RADIOGRAPHIC STUDIES: I have personally reviewed the radiological images as listed and agreed with the findings in the report: no evidence of metastatic diease in the chest. MRI abdomen does not show metastatic disease.   No results found.  ASSESSMENT & PLAN Alando Sandner 65 y.o. male with medical history significant for rectal adenocarcinoma who presents for a follow up visit.   At this time the patient's findings are most consistent with localized rectal adenocarcinoma.  MRI of the pelvis confirmed this is a T4 N2 M0 stage IIIc cancer.  As such we will plan to proceed with neoadjuvant chemotherapy, followed by chemoradiation, followed by reevaluation for consideration of transabdominal resection.  The initial treatment of choice would be neoadjuvant FOLFOX chemotherapy for 8 cycles.  Once this is complete we will transition to chemoradiation with p.o. capecitabine therapy.  When these are complete we will restage for consideration of surgical resection.  # Rectal Adenocarcinoma, T4N2M0 Stage IIIc --  staging with a CT scan of the chest and MRI abdomen/pelvis findings are consistent with localized disease.   --CEA modestly elevated at 17.44 on 02/09/2021 prior to start of therapy. --Given that this is adenocarcinoma of the rectum I would recommend proceeding with neoadjuvant chemotherapy, followed by chemoradiation, followed by definitive surgery. Case discussed at [facility] --Patient has established care with Dr.  Lisbeth Renshaw and radiation oncology. Plan: --OK to proceed with Cycle 8 of neoadjuvant FOLFOX today.  --Need to follow up with Dr. Lisbeth Renshaw to plan for chemoradiation.  Reached out to radiation oncology to discuss this with him today. --Need c-scope via colostomy before surgery as recommended by Dr. Hester Mates ([place]). I have reached out to Dr. Silverio Decamp (GI) for  a follow up.  --Dosage of capecitabine to be administered with chemotherapy is 825 mg per metered squared p.o. twice daily on days radiation is received. --RTC for the start of chemoradiation with Xeloda.  Anticipate this will be in approximately 2 to 3 weeks time.  # Hyperglyclemia --BG elevated, patient has no history of DM type II --BG 100 today, abnormal elevations since 04/15/2021  --currently on glipizide 5mg  PO daily --continue to monitor.   # Neutropenia, chemotherapy induced.  --last cycle delayed due to neutropenia.  --ANC is 5100 today, secondary to chemotherapy --OK to proceed with cycle 8 today  #Anemia #Thrombocyotpenia --Hgb noted at 10.3, mildly declining --Plt 191K today, improved from prior cycle. --likely secondary to active malignancy and modest iron deficiency. Some contribution from chemotherapy as well.  --continue to monitor  #Hypokalemia: --Today's potassium is 3.1. --Gave 40 mEq of oral potassium today  #Supportive Care -- chemotherapy education complete -- port placed -- zofran 8mg  q8H PRN and compazine 10mg  PO q6H for nausea -- EMLA cream for port -- no pain medication required at this time.   No orders of the defined types were placed in this encounter.  All questions were answered. The patient knows to call the clinic with any problems, questions or concerns.  I have spent a total of 30 minutes minutes of face-to-face and non-face-to-face time, preparing to see the patient, obtaining and/or reviewing separately obtained history, performing a medically appropriate examination, counseling and  educating the patient, ordering medications,  communicating with other health care professionals, documenting clinical information in the electronic health record, and care coordination.   Ledell Peoples, MD Department of Hematology/Oncology [place] [facility] Phone: [phone] Pager: [phone] Email: [email or web address]   06/28/2021 7:19 PM  Hofheinz RD, Raynald Blend, Post S, Matzdorff A, Laechelt S, Edrick Oh, Mller L, Link H, Moehler M, Stacey Drain, [place], [place], [place], Hipp M, Hartung G, Gencer D, [place], Tonsina I, Hochhaus A. Chemoradiotherapy with capecitabine versus fluorouracil for locally advanced rectal cancer: a randomised, multicentre, non-inferiority, phase 3 trial. Lancet Oncol. 2012 Jun;13(6):579-88.

## 2021-06-23 NOTE — Progress Notes (Signed)
Pt completed treatment without additional hypersensitivity issues.  Pt alert and oriented with no complaints.  5FU push/pump administered.  Pt knows to call clinic with any issues.  Will return for pump d/c on Wednesday.

## 2021-06-23 NOTE — Progress Notes (Signed)
.  Hypersensitivity Reaction note  Date of event: 06/22/2021 LATE ENTRY Time of event: 1500 Generic name of drug involved: Oxaliplatin Name of provider notified of the hypersensitivity reaction: Dr. Lorenso Courier Was agent that likely caused hypersensitivity reaction added to Allergies List within EMR? Yes Chain of events including reaction signs/symptoms, treatment administered, and outcome (e.g., drug resumed; drug discontinued; sent to Emergency Department; etc.) See documentation from Curtis Creamer, RN in separate progress note  Curtis Rhyme, RN 06/23/2021 11:12 AM

## 2021-06-24 ENCOUNTER — Inpatient Hospital Stay: Payer: Self-pay

## 2021-06-24 ENCOUNTER — Other Ambulatory Visit: Payer: Self-pay

## 2021-06-24 VITALS — BP 161/96 | HR 93 | Resp 16

## 2021-06-24 DIAGNOSIS — C20 Malignant neoplasm of rectum: Secondary | ICD-10-CM

## 2021-06-24 MED ORDER — HEPARIN SOD (PORK) LOCK FLUSH 100 UNIT/ML IV SOLN
500.0000 [IU] | Freq: Once | INTRAVENOUS | Status: AC | PRN
  Administered 2021-06-24: 500 [IU]

## 2021-06-24 MED ORDER — SODIUM CHLORIDE 0.9% FLUSH
10.0000 mL | INTRAVENOUS | Status: DC | PRN
  Administered 2021-06-24: 10 mL

## 2021-06-24 NOTE — Patient Instructions (Signed)

## 2021-06-25 ENCOUNTER — Telehealth: Payer: Self-pay

## 2021-06-25 NOTE — Telephone Encounter (Signed)
Called the patient. No answer. Left a detailed message on the voicemail. Asked the patient to call back and confirm or change the appointment I have scheduled him for on 07/03/21 at 9:30 am.

## 2021-06-25 NOTE — Telephone Encounter (Signed)
-----   Message from Mauri Pole, MD sent at 06/25/2021  2:21 PM EST ----- Regarding: RE: F/U request Yes, we will try to get him for colonoscopy through ostomy.  Beth can you please schedule patient for direct procedure at [facility] with pre visit on Dec 7th if patient is able to proceed. Thanks ----- Message ----- From: Cordelia Poche Sent: 06/10/2021   9:42 AM EST To: Mauri Pole, MD Subject: F/U request                                    Dr. Silverio Decamp,   Mr. Teal is undergoing treatment for rectal cancer. He requested a follow up with you as patient's colorectal surgeon from [facility] requested a c-scope via colostomy before surgery. He is currently nearing his final cycle of chemotherapy and then plan to proceed with chemoradiation before surgery. Please let me know if you are able to see him.   Thanks!  Murray Hodgkins

## 2021-06-28 ENCOUNTER — Other Ambulatory Visit (HOSPITAL_COMMUNITY): Payer: Self-pay

## 2021-06-28 ENCOUNTER — Encounter: Payer: Self-pay | Admitting: Hematology and Oncology

## 2021-06-28 MED ORDER — CAPECITABINE 500 MG PO TABS
825.0000 mg/m2 | ORAL_TABLET | Freq: Two times a day (BID) | ORAL | 0 refills | Status: DC
  Filled 2021-06-28: qty 210, 35d supply, fill #0

## 2021-06-29 ENCOUNTER — Telehealth: Payer: Self-pay | Admitting: Pharmacist

## 2021-06-29 ENCOUNTER — Telehealth: Payer: Self-pay | Admitting: *Deleted

## 2021-06-29 ENCOUNTER — Other Ambulatory Visit (HOSPITAL_COMMUNITY): Payer: Self-pay

## 2021-06-29 DIAGNOSIS — C20 Malignant neoplasm of rectum: Secondary | ICD-10-CM

## 2021-06-29 MED ORDER — CAPECITABINE 500 MG PO TABS
825.0000 mg/m2 | ORAL_TABLET | Freq: Two times a day (BID) | ORAL | 0 refills | Status: AC
  Filled 2021-06-29 – 2021-07-06 (×2): qty 210, 35d supply, fill #0

## 2021-06-29 NOTE — Telephone Encounter (Signed)
Attempted call to patient to let him know that [facility] has been trying to contact him to remind him of his appt there on 07/03/21. No answer but was able to leave vm message to call that office and to call here to confirm receipt of this message.

## 2021-06-29 NOTE — Telephone Encounter (Signed)
Oral Oncology Pharmacist Encounter  Received new prescription for Xeloda (capecitabine) for the treatment of rectal cancer in conjunction with radiation, planned for duration of radiation.  CBC w/ Diff and CMP from 06/22/21 assessed, no relevant lab abnormalities noted. Prescription dose and frequency assessed for appropriateness. Appropriate for therapy initiation.   Current medication list in Epic reviewed, no relevant/significant DDIs with Xeloda identified.  Evaluated chart and no patient barriers to medication adherence noted.   Patient agreement for treatment documented in MD note on 06/22/21.  Prescription has been e-scribed to the [facility] for benefits analysis and approval.  Oral Oncology Clinic will continue to follow for insurance authorization, copayment issues, initial counseling and start date.  Leron Croak, PharmD, BCPS Hematology/Oncology Clinical Pharmacist [facility] [place] [phone] 06/29/2021 8:31 AM

## 2021-06-29 NOTE — Telephone Encounter (Signed)
Called the patient again. Again got his voicemail. Explained the reason for the call, gave the appointment information. Asked he or a representative call to cancel or confirm.

## 2021-06-29 NOTE — Telephone Encounter (Signed)
Patient confirmed appt.

## 2021-07-01 ENCOUNTER — Other Ambulatory Visit (HOSPITAL_COMMUNITY): Payer: Self-pay

## 2021-07-03 ENCOUNTER — Ambulatory Visit: Payer: Self-pay | Admitting: Gastroenterology

## 2021-07-06 ENCOUNTER — Other Ambulatory Visit (HOSPITAL_COMMUNITY): Payer: Self-pay

## 2021-07-06 ENCOUNTER — Encounter: Payer: Self-pay | Admitting: Hematology and Oncology

## 2021-07-06 NOTE — Telephone Encounter (Signed)
Oral Chemotherapy Pharmacist Encounter  I spoke with patient for overview of: Xeloda for the treatment of rectal cancer in conjunction with radiation, planned duration 5 1/2 - 6 weeks.   Counseled patient on administration, dosing, side effects, monitoring, drug-food interactions, safe handling, storage, and disposal.  Patient will take Xeloda 500mg  tablets, 3 tablets (1500mg ) by mouth in AM and 3 tabs (1500mg ) by mouth in PM, within 30 minutes of finishing meals, on days of radiation only.  Xeloda and radiation start date: pending - patient knows not to start Xeloda until first day of radiation.   Adverse effects of Xeloda include but are not limited to: fatigue, decreased blood counts, diarrhea, mouth sores, and hand-foot syndrome.  Patient has anti-emetic on hand and knows to take it if nausea develops.   Patient will obtain anti diarrheal and alert the office of 4 or more loose stools above baseline.   Reviewed with patient importance of keeping a medication schedule and plan for any missed doses. No barriers to medication adherence identified.  Medication reconciliation performed and medication/allergy list updated.  All questions answered.  Curtis [name] voiced understanding and appreciation.   Medication education handout placed in mail for patient. Patient knows to call the office with questions or concerns. Oral Chemotherapy Clinic phone number provided to patient.   Leron Croak, PharmD, BCPS Hematology/Oncology Clinical Pharmacist [facility] [place] [phone] 07/06/2021 12:13 PM

## 2021-07-07 ENCOUNTER — Encounter: Payer: Self-pay | Admitting: Radiation Oncology

## 2021-07-07 ENCOUNTER — Ambulatory Visit
Admission: RE | Admit: 2021-07-07 | Discharge: 2021-07-07 | Disposition: A | Discharge status: Home / Self Care | Payer: Self-pay | Source: Ambulatory Visit | Attending: Radiation Oncology | Admitting: Radiation Oncology

## 2021-07-07 ENCOUNTER — Telehealth: Payer: Self-pay

## 2021-07-07 ENCOUNTER — Other Ambulatory Visit: Payer: Self-pay

## 2021-07-07 VITALS — BP 144/77 | HR 72 | Temp 97.9°F | Resp 19 | Ht 68.0 in | Wt 175.0 lb

## 2021-07-07 DIAGNOSIS — Z933 Colostomy status: Secondary | ICD-10-CM | POA: Insufficient documentation

## 2021-07-07 DIAGNOSIS — F17210 Nicotine dependence, cigarettes, uncomplicated: Secondary | ICD-10-CM | POA: Insufficient documentation

## 2021-07-07 DIAGNOSIS — Z7984 Long term (current) use of oral hypoglycemic drugs: Secondary | ICD-10-CM | POA: Insufficient documentation

## 2021-07-07 DIAGNOSIS — C20 Malignant neoplasm of rectum: Secondary | ICD-10-CM | POA: Insufficient documentation

## 2021-07-07 DIAGNOSIS — Z79899 Other long term (current) drug therapy: Secondary | ICD-10-CM | POA: Insufficient documentation

## 2021-07-07 DIAGNOSIS — Z803 Family history of malignant neoplasm of breast: Secondary | ICD-10-CM | POA: Insufficient documentation

## 2021-07-07 NOTE — Telephone Encounter (Signed)
Spoke w/ patient in regards to a friendly reminder of his 1:00pm "FUN" appointment today 07/07/21 w/ Shona Simpson PA-C. Patient verbalized understanding.

## 2021-07-07 NOTE — Progress Notes (Signed)
Patient states doing well. No symptoms reported at this time.  Meaningful use complete.  BP (!) 144/77 (BP Location: Left Arm, Patient Position: Sitting, Cuff Size: Normal)    Pulse 72    Temp 97.9 F (36.6 C) (Oral)    Resp 19    Ht 5\' 8"  (1.727 m)    Wt 175 lb (79.4 kg)    SpO2 100%    BMI 26.61 kg/m

## 2021-07-08 ENCOUNTER — Other Ambulatory Visit: Payer: Self-pay

## 2021-07-08 ENCOUNTER — Other Ambulatory Visit: Payer: Self-pay | Admitting: Radiation Oncology

## 2021-07-08 ENCOUNTER — Ambulatory Visit
Admission: RE | Admit: 2021-07-08 | Discharge: 2021-07-08 | Disposition: A | Discharge status: Home / Self Care | Payer: Self-pay | Source: Ambulatory Visit | Attending: Radiation Oncology | Admitting: Radiation Oncology

## 2021-07-08 DIAGNOSIS — C20 Malignant neoplasm of rectum: Secondary | ICD-10-CM | POA: Insufficient documentation

## 2021-07-08 DIAGNOSIS — Z510 Encounter for antineoplastic radiation therapy: Secondary | ICD-10-CM | POA: Insufficient documentation

## 2021-07-08 MED ORDER — LIDOCAINE-PRILOCAINE 2.5-2.5 % EX CREA: 1.0000 "application " | TOPICAL_CREAM | CUTANEOUS | 0 refills | Status: DC | PRN

## 2021-07-08 NOTE — Progress Notes (Signed)
Radiation Oncology         [phone] ________________________________  Name: Curtis [name]        MRN: [id number]  Date of Service: 07/07/2021 DOB: 1956/05/18  CC:Pcp, No  Orson Slick, MD     REFERRING PHYSICIAN: Orson Slick, MD   DIAGNOSIS: The encounter diagnosis was Rectal cancer [facility]).   HISTORY OF PRESENT ILLNESS: Curtis [name] is a 65 y.o. male seen at the request of Dr. Vianne Bulls for a diagnosis of rectal cancer.  The patient initially was diagnosed in the setting of a bowel perforation.  CT imaging at that time suggested the possibility of a locally advanced proximal rectal tumor.  Biopsy did confirm adenocarcinoma.  CT imaging demonstrated some lymphadenopathy but it is not possible to tell definitively whether this represents tumor spread versus reactive from the perforation/infection.  MRI scan showed T4N2 disease. Because of the patient's initial presentation he is status post a colostomy.  His case was reviewed in multidisciplinary GI conference and it was felt that he would benefit from total neoadjuvant chemotherapy followed by chemoradiation and subsequent surgery.  Since his last visit, the patient began total neoadjuvant chemotherapy with Dr. Lorenso Courier.  He began FOLFOX on 03/05/2021, and completed 8 cycles of this therapy as of 06/22/2021 and is seen to discuss chemoradiation.      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[place])        PAST SURGICAL HISTORY: Past Surgical History:  Procedure Laterality Date   BIOPSY  01/21/2021   Procedure: BIOPSY;  Surgeon: Mauri Pole, MD;  Location: [place];  Service: Endoscopy;;   COLON RESECTION N/A 01/21/2021   Procedure: LAPAROSCOPIC ASSISTED LOOP COLOSTOMY;  Surgeon: Donnie Mesa, MD;  Location: [place];  Service: General;  Laterality: N/A;   FLEXIBLE SIGMOIDOSCOPY N/A 01/21/2021   Procedure: FLEXIBLE SIGMOIDOSCOPY;  Surgeon:  Mauri Pole, MD;  Location: [place];  Service: Endoscopy;  Laterality: N/A;   IR IMAGING GUIDED PORT INSERTION  03/04/2021   SUBMUCOSAL TATTOO INJECTION  01/21/2021   Procedure: SUBMUCOSAL TATTOO INJECTION;  Surgeon: Mauri Pole, MD;  [facility];  Service: Endoscopy;;     FAMILY HISTORY:  Family History  Problem Relation Age of Onset   Hypertension Mother    Hypertension Father    Breast cancer Sister      SOCIAL HISTORY:  reports that he has been smoking cigarettes. He has never used smokeless tobacco. He reports that he does not drink alcohol and does not use drugs. The patient is married and lives in [place]. He's originally from [place]. 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    ALLERGIES: Leucovorin and Oxaliplatin   MEDICATIONS:  Current Outpatient Medications  Medication Sig Dispense Refill   acetaminophen (TYLENOL) 500 MG tablet Take 1,000 mg by mouth daily.     capecitabine (XELODA) 500 MG tablet Take 3 tablets (1,500 mg total) by mouth 2 (two) times daily after a meal. Take only on days of radiation Monday - Friday. 210 tablet 0   glipiZIDE (GLUCOTROL) 5 MG tablet Take 1 tablet (5 mg total) by mouth daily before breakfast. 30 tablet 1   lidocaine-prilocaine (EMLA) cream Apply 1 application topically as needed. 30 g 0   nystatin (MYCOSTATIN) 100000 UNIT/ML suspension TAKE 5 ML BY MOUTH  4 TIMES DAILY FOR 7 DAYS 140 mL 0  ondansetron (ZOFRAN) 8 MG tablet Take 1 tablet (8 mg total) by mouth every 8 (eight) hours as needed. (Patient not taking: Reported on 06/10/2021) 30 tablet 0   potassium chloride SA (KLOR-CON) 20 MEQ tablet Take 1 tablet (20 mEq total) by mouth 2 (two) times daily. 30 tablet 0   prochlorperazine (COMPAZINE) 10 MG tablet Take 1 tablet (10 mg total) by mouth every 6 (six) hours as needed for nausea or vomiting. (Patient not taking: Reported on 06/10/2021) 30 tablet 0   No current facility-administered  medications for this encounter.     REVIEW OF SYSTEMS: On review of systems, the patient reports that he is doing well overall. He has normal stools per colostomy. He denies any specific concerns but is not having any verbalized rectal pain or bleeding.      PHYSICAL EXAM:  Wt Readings from Last 3 Encounters:  07/07/21 175 lb (79.4 kg)  06/22/21 185 lb 1.6 oz (84 kg)  06/10/21 177 lb 12.8 oz (80.6 kg)   Temp Readings from Last 3 Encounters:  07/07/21 97.9 F (36.6 C) (Oral)  06/22/21 98.8 F (37.1 C) (Oral)  06/22/21 98.2 F (36.8 C) (Tympanic)   BP Readings from Last 3 Encounters:  07/07/21 (!) 144/77  06/24/21 (!) 161/96  06/22/21 (!) 163/68   Pulse Readings from Last 3 Encounters:  07/07/21 72  06/24/21 93  06/22/21 62   Pain Assessment Pain Score: 0-No pain/10  In general this is a well appearing [place] male in no acute distress. He's alert and oriented x4 and appropriate throughout the examination. Cardiopulmonary assessment is negative for acute distress and he exhibits normal effort.     ECOG = 0  0 - Asymptomatic (Fully active, able to carry on all predisease activities without restriction)  1 - Symptomatic but completely ambulatory (Restricted in physically strenuous activity but ambulatory and able to carry out work of a light or sedentary nature. For example, light housework, office work)  2 - Symptomatic, <50% in bed during the day (Ambulatory and capable of all self care but unable to carry out any work activities. Up and about more than 50% of waking hours)  3 - Symptomatic, >50% in bed, but not bedbound (Capable of only limited self-care, confined to bed or chair 50% or more of waking hours)  4 - Bedbound (Completely disabled. Cannot carry on any self-care. Totally confined to bed or chair)  5 - Death   Eustace Pen MM, Creech RH, Tormey DC, et al. [phone]). "Toxicity and response criteria of the [facility]". [place] Oncol. 5  (6): 649-55    LABORATORY DATA:  Lab Results  Component Value Date   WBC 7.9 06/22/2021   HGB 10.3 (L) 06/22/2021   HCT 30.9 (L) 06/22/2021   MCV 86.6 06/22/2021   PLT 191 06/22/2021   Lab Results  Component Value Date   NA 141 06/22/2021   K 3.1 (L) 06/22/2021   CL 107 06/22/2021   CO2 25 06/22/2021   Lab Results  Component Value Date   ALT 13 06/22/2021   AST 19 06/22/2021   ALKPHOS 100 06/22/2021   BILITOT 0.4 06/22/2021      RADIOGRAPHY: No results found.     IMPRESSION/PLAN: 1. Stage IIIC, cT4N2M0 adenocarcinoma of the rectum. Dr. Lisbeth Renshaw discusses the patient's course to date and the rationale for proceeding with chemoRT. We discussed the risks, benefits, short, and long term effects of radiotherapy, as well as the curative intent, and the  patient is interested in proceeding. Dr. Lisbeth Renshaw discusses the delivery and logistics of radiotherapy and anticipates a course of  5 1/2  weeks of radiotherapy to the pelvis. Written consent is obtained and placed in the chart, a copy was provided to the patient. He will simulate tomorrow. We anticipate he will start on 07/13/21. He will finish treatment on 08/21/21 and follow up with Dr. Hester Mates at [facility] regarding surgery. He's already schedule to see him early February 2023. 2. PAC flushes. The patient needs a new prescription for his EMLA cream. I will send in a new prescription to his pharmacy.  In a visit lasting 60 minutes, greater than 50% of the time was spent face to face discussing the patient's condition, in preparation for the discussion, and coordinating the patient's care.   The above documentation reflects my direct findings during this shared patient visit. Please see the separate note by Dr. Lisbeth Renshaw on this date for the remainder of the patient's plan of care.    Carola Rhine, [facility]   **Disclaimer: This note was dictated with voice recognition software. Similar sounding words can inadvertently be transcribed and this  note may contain transcription errors which may not have been corrected upon publication of note.**

## 2021-07-09 ENCOUNTER — Inpatient Hospital Stay: Payer: Self-pay | Attending: Hematology and Oncology

## 2021-07-09 ENCOUNTER — Other Ambulatory Visit: Payer: Self-pay

## 2021-07-09 ENCOUNTER — Inpatient Hospital Stay (HOSPITAL_BASED_OUTPATIENT_CLINIC_OR_DEPARTMENT_OTHER): Payer: Self-pay | Admitting: Hematology and Oncology

## 2021-07-09 VITALS — BP 165/88 | HR 78 | Temp 97.8°F | Resp 17 | Wt 180.7 lb

## 2021-07-09 DIAGNOSIS — D649 Anemia, unspecified: Secondary | ICD-10-CM | POA: Insufficient documentation

## 2021-07-09 DIAGNOSIS — T451X5A Adverse effect of antineoplastic and immunosuppressive drugs, initial encounter: Secondary | ICD-10-CM

## 2021-07-09 DIAGNOSIS — Z79899 Other long term (current) drug therapy: Secondary | ICD-10-CM | POA: Insufficient documentation

## 2021-07-09 DIAGNOSIS — Z8249 Family history of ischemic heart disease and other diseases of the circulatory system: Secondary | ICD-10-CM | POA: Insufficient documentation

## 2021-07-09 DIAGNOSIS — R109 Unspecified abdominal pain: Secondary | ICD-10-CM | POA: Insufficient documentation

## 2021-07-09 DIAGNOSIS — Z95828 Presence of other vascular implants and grafts: Secondary | ICD-10-CM

## 2021-07-09 DIAGNOSIS — Z923 Personal history of irradiation: Secondary | ICD-10-CM | POA: Insufficient documentation

## 2021-07-09 DIAGNOSIS — R739 Hyperglycemia, unspecified: Secondary | ICD-10-CM | POA: Insufficient documentation

## 2021-07-09 DIAGNOSIS — R197 Diarrhea, unspecified: Secondary | ICD-10-CM | POA: Insufficient documentation

## 2021-07-09 DIAGNOSIS — Z803 Family history of malignant neoplasm of breast: Secondary | ICD-10-CM | POA: Insufficient documentation

## 2021-07-09 DIAGNOSIS — F17210 Nicotine dependence, cigarettes, uncomplicated: Secondary | ICD-10-CM | POA: Insufficient documentation

## 2021-07-09 DIAGNOSIS — C20 Malignant neoplasm of rectum: Secondary | ICD-10-CM | POA: Insufficient documentation

## 2021-07-09 DIAGNOSIS — E876 Hypokalemia: Secondary | ICD-10-CM | POA: Insufficient documentation

## 2021-07-09 DIAGNOSIS — D702 Other drug-induced agranulocytosis: Secondary | ICD-10-CM | POA: Insufficient documentation

## 2021-07-09 DIAGNOSIS — D701 Agranulocytosis secondary to cancer chemotherapy: Secondary | ICD-10-CM

## 2021-07-09 LAB — CBC WITH DIFFERENTIAL (CANCER CENTER ONLY)
Abs Immature Granulocytes: 0.01 10*3/uL (ref 0.00–0.07)
Basophils Absolute: 0 10*3/uL (ref 0.0–0.1)
Basophils Relative: 1 %
Eosinophils Absolute: 0.2 10*3/uL (ref 0.0–0.5)
Eosinophils Relative: 4 %
HCT: 29.7 % — ABNORMAL LOW (ref 39.0–52.0)
Hemoglobin: 9.9 g/dL — ABNORMAL LOW (ref 13.0–17.0)
Immature Granulocytes: 0 %
Lymphocytes Relative: 43 %
Lymphs Abs: 1.6 10*3/uL (ref 0.7–4.0)
MCH: 29.6 pg (ref 26.0–34.0)
MCHC: 33.3 g/dL (ref 30.0–36.0)
MCV: 88.9 fL (ref 80.0–100.0)
Monocytes Absolute: 0.4 10*3/uL (ref 0.1–1.0)
Monocytes Relative: 12 %
Neutro Abs: 1.5 10*3/uL — ABNORMAL LOW (ref 1.7–7.7)
Neutrophils Relative %: 40 %
Platelet Count: 178 10*3/uL (ref 150–400)
RBC: 3.34 MIL/uL — ABNORMAL LOW (ref 4.22–5.81)
RDW: 17 % — ABNORMAL HIGH (ref 11.5–15.5)
WBC Count: 3.7 10*3/uL — ABNORMAL LOW (ref 4.0–10.5)
nRBC: 0 % (ref 0.0–0.2)

## 2021-07-09 LAB — CMP (CANCER CENTER ONLY)
ALT: 18 U/L (ref 0–44)
AST: 22 U/L (ref 15–41)
Albumin: 3.4 g/dL — ABNORMAL LOW (ref 3.5–5.0)
Alkaline Phosphatase: 92 U/L (ref 38–126)
Anion gap: 7 (ref 5–15)
BUN: 9 mg/dL (ref 8–23)
CO2: 23 mmol/L (ref 22–32)
Calcium: 8.7 mg/dL — ABNORMAL LOW (ref 8.9–10.3)
Chloride: 111 mmol/L (ref 98–111)
Creatinine: 0.73 mg/dL (ref 0.61–1.24)
GFR, Estimated: >60 mL/min (ref >60)
Glucose, Bld: 76 mg/dL (ref 70–99)
Potassium: 3.5 mmol/L (ref 3.5–5.1)
Sodium: 141 mmol/L (ref 135–145)
Total Bilirubin: 0.3 mg/dL (ref 0.3–1.2)
Total Protein: 7.2 g/dL (ref 6.5–8.1)

## 2021-07-09 MED ORDER — HEPARIN SOD (PORK) LOCK FLUSH 100 UNIT/ML IV SOLN
500.0000 [IU] | Freq: Once | INTRAVENOUS | Status: AC
  Administered 2021-07-09: 500 [IU]

## 2021-07-09 MED ORDER — SODIUM CHLORIDE 0.9% FLUSH
10.0000 mL | Freq: Once | INTRAVENOUS | Status: AC
Start: 2021-07-09 — End: 2021-07-09
  Administered 2021-07-09: 10 mL

## 2021-07-09 NOTE — Progress Notes (Signed)
[place] [phone]   [phone]  PROGRESS NOTE  Patient Care Team: Pcp, No as PCP - General Orson Slick, MD as Consulting Physician (Medical Oncology) Royston Bake, RN as Oncology Nurse Navigator (Oncology)  Hematological/Oncological History # Rectal Adenocarcinoma, T4N2M0. Stage IIIc 01/16/2021: presented to the emergency department with abdominal pain. CT abdomen/pelvis showed concern for mass lesion with perforation and surrounding adenopathy. 01/19/2021: repeat CT scan showed worsening inflammatory changes around the complex rectal process 01/21/2021: flexible sigmoidoscopy performed, lesion at 12 cm from the anal verge.  biopsy showed concern for adenocarcinoma. 01/21/2021: patient underwent a laparoscopic assisted loop colostomy with Dr. Georgette Dover. 02/09/2021: establish care with Dr. Lorenso Courier  03/05/2021: Cycle 1 Day 1 of neoadjuvant FOLFOX 03/18/2021: Cycle 2 Day 1 of neoadjuvant FOLFOX 04/02/2021: Cycle 3 Day 1 of neoadjuvant FOLFOX 04/15/2021: Cycle 4 Day 1 of neoadjuvant FOLFOX 04/30/2021: Cycle 5 Day 1 of neoadjuvant FOLFOX 05/14/2021: Cycle 6 Day 1 of neoadjuvant FOLFOX 05/27/2021: Cycle 7 Day 1 of neoadjuvant FOLFOX 06/10/2021: Cycle 8 Day 1 of neoadjuvant FOLFOX delayed due to neutropenia.  06/22/2021: Cycle 8 Day 1 of neoadjuvant FOLFOX 07/13/2021: intended start of Chemoradiation with Xeloda 825 mg/m2  Interval History:  Curtis [name] 65 y.o. male with medical history significant for rectal adenocarcinoma who presents for a follow up visit. The patient's last visit was on 06/22/2021. In the interim since the last visit he has completed Cycle 8 of neoadjuvant FOLFOX.   On exam today Curtis [name] reports he has been well overall in the interim since her last visit.  He reports that his appetite is coming back and he is having less sensitivity to cold.  He notes that he did have a mild cold last week but that he was able to "sleep it off".  He  notes that he is "hungry all the time".  He notes that he did eat some [place] food that caused him to have diarrhea but otherwise his bowels have been quite stable.  He denies any other stomach upset.  His ostomy bag has been producing soft brown stool without any difficulty.  He denies any hematochezia or melena.  He denies any fevers, chills, night sweats, shortness of breath, chest pain, cough, neuropathy, mucositis or rash. A full 10 point ROS is listed below.  Overall he is willing and able and prepared to start treatment with chemoradiation.  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[place])     SURGICAL HISTORY: Past Surgical History:  Procedure Laterality Date   BIOPSY  01/21/2021   Procedure: BIOPSY;  Surgeon: Mauri Pole, MD;  Location: [facility];  Service: Endoscopy;;   COLON RESECTION N/A 01/21/2021   Procedure: LAPAROSCOPIC ASSISTED LOOP COLOSTOMY;  Surgeon: Donnie Mesa, MD;  Location: [facility];  Service: General;  Laterality: N/A;   FLEXIBLE SIGMOIDOSCOPY N/A 01/21/2021   Procedure: FLEXIBLE SIGMOIDOSCOPY;  Surgeon: Mauri Pole, MD;  Location: [facility];  Service: Endoscopy;  Laterality: N/A;   IR IMAGING GUIDED PORT INSERTION  03/04/2021   SUBMUCOSAL TATTOO INJECTION  01/21/2021   Procedure: SUBMUCOSAL TATTOO INJECTION;  Surgeon: Mauri Pole, MD;  [facility];  Service: Endoscopy;;    SOCIAL HISTORY: Social History   Socioeconomic History   Marital status: Divorced    Spouse name: Not on file   Number of children: Not on file   Years of education: Not on file   Highest education level: Not on file  Occupational History   Not on file  Tobacco Use   Smoking status: Every Day    Types: Cigarettes   Smokeless tobacco: Never  Vaping Use   Vaping Use: Never used  Substance and Sexual Activity   Alcohol use: Never   Drug use: Never   Sexual activity: Never  Other Topics Concern   Not on file   Social History Narrative   Not on file   Social Determinants of Health   Financial Resource Strain: Low Risk    Difficulty of Paying Living Expenses: Not very hard  Food Insecurity: No Food Insecurity   Worried About Running Out of Food in the Last Year: Never true   Ran Out of Food in the Last Year: Never true  Transportation Needs: No Transportation Needs   Lack of Transportation (Medical): No   Lack of Transportation (Non-Medical): No  Physical Activity: Not on file  Stress: Not on file  Social Connections: Not on file  Intimate Partner Violence: Not At Risk   Fear of Current or Ex-Partner: No   Emotionally Abused: No   Physically Abused: No   Sexually Abused: No    FAMILY HISTORY: Family History  Problem Relation Age of Onset   Hypertension Mother    Hypertension Father    Breast cancer Sister     ALLERGIES:  is allergic to leucovorin and oxaliplatin.  MEDICATIONS:  Current Outpatient Medications  Medication Sig Dispense Refill   acetaminophen (TYLENOL) 500 MG tablet Take 1,000 mg by mouth daily.     capecitabine (XELODA) 500 MG tablet Take 3 tablets (1,500 mg total) by mouth 2 (two) times daily after a meal. Take only on days of radiation Monday - Friday. 210 tablet 0   glipiZIDE (GLUCOTROL) 5 MG tablet Take 1 tablet (5 mg total) by mouth daily before breakfast. 30 tablet 1   lidocaine-prilocaine (EMLA) cream Apply 1 application topically as needed. 30 g 0   nystatin (MYCOSTATIN) 100000 UNIT/ML suspension TAKE 5 ML BY MOUTH  4 TIMES DAILY FOR 7 DAYS 140 mL 0   ondansetron (ZOFRAN) 8 MG tablet Take 1 tablet (8 mg total) by mouth every 8 (eight) hours as needed. (Patient not taking: Reported on 06/10/2021) 30 tablet 0   potassium chloride SA (KLOR-CON) 20 MEQ tablet Take 1 tablet (20 mEq total) by mouth 2 (two) times daily. 30 tablet 0   prochlorperazine (COMPAZINE) 10 MG tablet Take 1 tablet (10 mg total) by mouth every 6 (six) hours as needed for nausea or vomiting.  (Patient not taking: Reported on 06/10/2021) 30 tablet 0   No current facility-administered medications for this visit.    REVIEW OF SYSTEMS:   Constitutional: ( - ) fevers, ( - )  chills , ( - ) night sweats Eyes: ( - ) blurriness of vision, ( - ) double vision, ( - ) watery eyes Ears, nose, mouth, throat, and face: ( - ) mucositis, ( - ) sore throat Respiratory: ( - ) cough, ( - ) dyspnea, ( - ) wheezes Cardiovascular: ( - ) palpitation, ( - ) chest discomfort, ( - ) lower extremity swelling Gastrointestinal:  ( - ) nausea, ( - ) heartburn, ( - ) change in bowel habits Skin: ( - ) abnormal skin rashes Lymphatics: ( - ) new lymphadenopathy, ( - ) easy bruising Neurological: ( - ) numbness, ( - ) tingling, ( - ) new weaknesses Behavioral/Psych: ( - ) mood change, ( - ) new changes  All other  systems were reviewed with the patient and are negative.  PHYSICAL EXAMINATION: ECOG PERFORMANCE STATUS: 1 - Symptomatic but completely ambulatory  Vitals:   07/09/21 1315  BP: (!) 165/88  Pulse: 78  Resp: 17  Temp: 97.8 F (36.6 C)  SpO2: 100%     Filed Weights   07/09/21 1315  Weight: 180 lb 11.2 oz (82 kg)     GENERAL: Well-appearing middle-aged Curtis [name] male, alert, no distress and comfortable SKIN: skin color, texture, turgor are normal, no rashes or significant lesions THROAT: No mucositis. No overt bleeding or evidence of infection.  EYES: conjunctiva are pink and non-injected, sclera clear LUNGS: clear to auscultation and percussion with normal breathing effort HEART: regular rate & rhythm and no murmurs and no lower extremity edema ABDOMEN: soft, non-tender, non-distended, normal bowel sounds.  Ostomy clean dry and intact  Musculoskeletal: no cyanosis of digits and no clubbing  PSYCH: alert & oriented x 3, fluent speech NEURO: no focal motor/sensory deficits  LABORATORY DATA:  I have reviewed the data as listed CBC Latest Ref Rng & Units 07/09/2021 06/22/2021 06/10/2021   WBC 4.0 - 10.5 K/uL 3.7(L) 7.9 1.9(L)  Hemoglobin 13.0 - 17.0 g/dL 9.9(L) 10.3(L) 11.2(L)  Hematocrit 39.0 - 52.0 % 29.7(L) 30.9(L) 32.5(L)  Platelets 150 - 400 K/uL 178 191 138(L)    CMP Latest Ref Rng & Units 07/09/2021 06/22/2021 06/10/2021  Glucose 70 - 99 mg/dL 76 100(H) 381(H)  BUN 8 - 23 mg/dL 9 16 8   Creatinine 0.61 - 1.24 mg/dL 0.73 1.10 0.93  Sodium 135 - 145 mmol/L 141 141 139  Potassium 3.5 - 5.1 mmol/L 3.5 3.1(L) 3.2(L)  Chloride 98 - 111 mmol/L 111 107 106  CO2 22 - 32 mmol/L 23 25 22   Calcium 8.9 - 10.3 mg/dL 8.7(L) 8.6(L) 8.8(L)  Total Protein 6.5 - 8.1 g/dL 7.2 6.7 6.9  Total Bilirubin 0.3 - 1.2 mg/dL 0.3 0.4 0.3  Alkaline Phos 38 - 126 U/L 92 100 102  AST 15 - 41 U/L 22 19 24   ALT 0 - 44 U/L 18 13 18     RADIOGRAPHIC STUDIES: I have personally reviewed the radiological images as listed and agreed with the findings in the report: no evidence of metastatic diease in the chest. MRI abdomen does not show metastatic disease.   No results found.  ASSESSMENT & PLAN Shylo Marzella 65 y.o. male with medical history significant for rectal adenocarcinoma who presents for a follow up visit.   At this time the patient's findings are most consistent with localized rectal adenocarcinoma.  MRI of the pelvis confirmed this is a T4 N2 M0 stage IIIc cancer.  As such we will plan to proceed with neoadjuvant chemotherapy, followed by chemoradiation, followed by reevaluation for consideration of transabdominal resection.  The initial treatment of choice would be neoadjuvant FOLFOX chemotherapy for 8 cycles.  Once this is complete we will transition to chemoradiation with p.o. capecitabine therapy.  When these are complete we will restage for consideration of surgical resection.  # Rectal Adenocarcinoma, T4N2M0 Stage IIIc --  staging with a CT scan of the chest and MRI abdomen/pelvis findings are consistent with localized disease.   --CEA modestly elevated at 17.44 on 02/09/2021 prior to  start of therapy. --Given that this is adenocarcinoma of the rectum I would recommend proceeding with neoadjuvant chemotherapy, followed by chemoradiation, followed by definitive surgery. Case discussed at [facility] --Patient has established care with Dr. Lisbeth Renshaw and radiation oncology. Plan: --patient completed 8 cycles of neoadjuvant FOLFOX --  plan to start chemoradiation therapy with Xeloda on 07/13/2021.  --Need c-scope via colostomy before surgery as recommended by Dr. Hester Mates ([place]). GI has been contacted for f/u.  --Dosage of capecitabine to be administered with chemotherapy is 825 mg per metered squared p.o. twice daily on days radiation is received. --RTC on 07/21/2021 to assure he is tolerating Xeloda with radiation.   # Hyperglyclemia --BG elevated, patient has no history of DM type II --BG 76 today, abnormal elevations since 04/15/2021  --currently on glipizide 5mg  PO daily --continue to monitor.   # Neutropenia, chemotherapy induced.  --last cycle delayed due to neutropenia.  --ANC is 1500 today, secondary to chemotherapy --OK to proceed with Xeloda with radiation next week.   #Anemia #Thrombocyotpenia --Hgb noted at 9.9, mildly declining --Plt 178 K today, improved from prior cycle. --likely secondary to active malignancy and modest iron deficiency. Some contribution from chemotherapy as well.  --continue to monitor  #Hypokalemia: --Today's potassium is 3.5. --continue 40 mEq of oral potassium daily  #Supportive Care -- chemotherapy education complete -- port placed -- zofran 8mg  q8H PRN and compazine 10mg  PO q6H for nausea -- EMLA cream for port -- no pain medication required at this time.   No orders of the defined types were placed in this encounter.  All questions were answered. The patient knows to call the clinic with any problems, questions or concerns.  I have spent a total of 30 minutes minutes of face-to-face and non-face-to-face time, preparing to see  the patient, obtaining and/or reviewing separately obtained history, performing a medically appropriate examination, counseling and educating the patient, ordering medications,  communicating with other health care professionals, documenting clinical information in the electronic health record, and care coordination.   Ledell Peoples, MD Department of Hematology/Oncology [facility] Phone: [phone] Pager: [phone] Email: [email or web address]   07/09/2021 4:16 PM  Hofheinz RD, Raynald Blend, Post S, Matzdorff A, Laechelt S, Edrick Oh, Mller L, Link H, Moehler M, Stacey Drain, [place], [place], [place], Hipp M, Hartung G, Gencer D, [place], [place] I, Hochhaus A. Chemoradiotherapy with capecitabine versus fluorouracil for locally advanced rectal cancer: a randomised, multicentre, non-inferiority, phase 3 trial. Lancet Oncol. 2012 Jun;13(6):579-88.

## 2021-07-13 ENCOUNTER — Other Ambulatory Visit: Payer: Self-pay

## 2021-07-13 ENCOUNTER — Ambulatory Visit
Admission: RE | Admit: 2021-07-13 | Discharge: 2021-07-13 | Disposition: A | Discharge status: Home / Self Care | Payer: Self-pay | Source: Ambulatory Visit | Attending: Radiation Oncology | Admitting: Radiation Oncology

## 2021-07-14 ENCOUNTER — Ambulatory Visit
Admission: RE | Admit: 2021-07-14 | Discharge: 2021-07-14 | Disposition: A | Discharge status: Home / Self Care | Payer: Self-pay | Source: Ambulatory Visit | Attending: Radiation Oncology | Admitting: Radiation Oncology

## 2021-07-15 ENCOUNTER — Ambulatory Visit
Admission: RE | Admit: 2021-07-15 | Discharge: 2021-07-15 | Disposition: A | Discharge status: Home / Self Care | Payer: Self-pay | Source: Ambulatory Visit | Attending: Radiation Oncology | Admitting: Radiation Oncology

## 2021-07-15 ENCOUNTER — Other Ambulatory Visit: Payer: Self-pay | Admitting: Hematology and Oncology

## 2021-07-15 ENCOUNTER — Other Ambulatory Visit: Payer: Self-pay

## 2021-07-16 ENCOUNTER — Ambulatory Visit
Admission: RE | Admit: 2021-07-16 | Discharge: 2021-07-16 | Disposition: A | Discharge status: Home / Self Care | Payer: Self-pay | Source: Ambulatory Visit | Attending: Radiation Oncology | Admitting: Radiation Oncology

## 2021-07-17 ENCOUNTER — Telehealth: Payer: Self-pay | Admitting: Hematology and Oncology

## 2021-07-17 ENCOUNTER — Ambulatory Visit
Admission: RE | Admit: 2021-07-17 | Discharge: 2021-07-17 | Disposition: A | Discharge status: Home / Self Care | Payer: Self-pay | Source: Ambulatory Visit | Attending: Radiation Oncology | Admitting: Radiation Oncology

## 2021-07-17 ENCOUNTER — Other Ambulatory Visit: Payer: Self-pay

## 2021-07-17 NOTE — Telephone Encounter (Signed)
Scheduled per sch msg. Called and left msg  

## 2021-07-17 NOTE — Progress Notes (Signed)
Pt here for patient teaching.  Pt given Radiation and You booklet.  Reviewed areas of pertinence such as diarrhea, fatigue, hair loss, sexual and fertility changes, skin changes, and urinary and bladder changes . Pt able to give teach back of to pat skin, use unscented/gentle soap, use baby wipes, have Imodium on hand, drink plenty of water, and sitz bath,avoid applying anything to skin within 4 hours of treatment. Pt verbalizes understanding of information given and will contact nursing with any questions or concerns.    Gloriajean Dell. Leonie Green, BSN

## 2021-07-21 ENCOUNTER — Ambulatory Visit
Admission: RE | Admit: 2021-07-21 | Discharge: 2021-07-21 | Disposition: A | Discharge status: Home / Self Care | Payer: Self-pay | Source: Ambulatory Visit | Attending: Radiation Oncology | Admitting: Radiation Oncology

## 2021-07-22 ENCOUNTER — Inpatient Hospital Stay: Payer: Self-pay | Admitting: Hematology and Oncology

## 2021-07-22 ENCOUNTER — Inpatient Hospital Stay: Payer: Self-pay

## 2021-07-22 ENCOUNTER — Telehealth: Payer: Self-pay | Admitting: *Deleted

## 2021-07-22 ENCOUNTER — Other Ambulatory Visit: Payer: Self-pay

## 2021-07-22 ENCOUNTER — Ambulatory Visit
Admission: RE | Admit: 2021-07-22 | Discharge: 2021-07-22 | Disposition: A | Discharge status: Home / Self Care | Payer: Self-pay | Source: Ambulatory Visit | Attending: Radiation Oncology | Admitting: Radiation Oncology

## 2021-07-22 NOTE — Telephone Encounter (Signed)
TCT patient regarding his missed appt today. Spoke with pt. He states he did not know he had an appt for labs and Dr. Lorenso Courier today. Was able to reschedule to 07/23/21, around his radiation time.  Pt in agreement. Dr. Lorenso Courier aware.

## 2021-07-23 ENCOUNTER — Inpatient Hospital Stay: Payer: Self-pay

## 2021-07-23 ENCOUNTER — Ambulatory Visit
Admission: RE | Admit: 2021-07-23 | Discharge: 2021-07-23 | Disposition: A | Discharge status: Home / Self Care | Payer: Self-pay | Source: Ambulatory Visit | Attending: Radiation Oncology | Admitting: Radiation Oncology

## 2021-07-23 ENCOUNTER — Other Ambulatory Visit: Payer: Self-pay

## 2021-07-23 ENCOUNTER — Inpatient Hospital Stay (HOSPITAL_BASED_OUTPATIENT_CLINIC_OR_DEPARTMENT_OTHER): Payer: Self-pay | Admitting: Hematology and Oncology

## 2021-07-23 VITALS — BP 144/84 | HR 77 | Temp 96.8°F | Resp 17 | Wt 182.9 lb

## 2021-07-23 DIAGNOSIS — Z95828 Presence of other vascular implants and grafts: Secondary | ICD-10-CM

## 2021-07-23 DIAGNOSIS — C20 Malignant neoplasm of rectum: Secondary | ICD-10-CM

## 2021-07-23 DIAGNOSIS — D701 Agranulocytosis secondary to cancer chemotherapy: Secondary | ICD-10-CM

## 2021-07-23 DIAGNOSIS — E876 Hypokalemia: Secondary | ICD-10-CM

## 2021-07-23 DIAGNOSIS — T451X5A Adverse effect of antineoplastic and immunosuppressive drugs, initial encounter: Secondary | ICD-10-CM

## 2021-07-23 LAB — CMP (CANCER CENTER ONLY)
ALT: 17 U/L (ref 0–44)
AST: 19 U/L (ref 15–41)
Albumin: 3.8 g/dL (ref 3.5–5.0)
Alkaline Phosphatase: 73 U/L (ref 38–126)
Anion gap: 5 (ref 5–15)
BUN: 11 mg/dL (ref 8–23)
CO2: 27 mmol/L (ref 22–32)
Calcium: 9.5 mg/dL (ref 8.9–10.3)
Chloride: 107 mmol/L (ref 98–111)
Creatinine: 1.01 mg/dL (ref 0.61–1.24)
GFR, Estimated: >60 mL/min (ref >60)
Glucose, Bld: 153 mg/dL — ABNORMAL HIGH (ref 70–99)
Potassium: 3.9 mmol/L (ref 3.5–5.1)
Sodium: 139 mmol/L (ref 135–145)
Total Bilirubin: 0.4 mg/dL (ref 0.3–1.2)
Total Protein: 6.9 g/dL (ref 6.5–8.1)

## 2021-07-23 LAB — CBC WITH DIFFERENTIAL (CANCER CENTER ONLY)
Abs Immature Granulocytes: 0.01 10*3/uL (ref 0.00–0.07)
Basophils Absolute: 0 10*3/uL (ref 0.0–0.1)
Basophils Relative: 1 %
Eosinophils Absolute: 0 10*3/uL (ref 0.0–0.5)
Eosinophils Relative: 1 %
HCT: 29.9 % — ABNORMAL LOW (ref 39.0–52.0)
Hemoglobin: 9.8 g/dL — ABNORMAL LOW (ref 13.0–17.0)
Immature Granulocytes: 0 %
Lymphocytes Relative: 19 %
Lymphs Abs: 0.7 10*3/uL (ref 0.7–4.0)
MCH: 30 pg (ref 26.0–34.0)
MCHC: 32.8 g/dL (ref 30.0–36.0)
MCV: 91.4 fL (ref 80.0–100.0)
Monocytes Absolute: 0.3 10*3/uL (ref 0.1–1.0)
Monocytes Relative: 7 %
Neutro Abs: 2.9 10*3/uL (ref 1.7–7.7)
Neutrophils Relative %: 72 %
Platelet Count: 153 10*3/uL (ref 150–400)
RBC: 3.27 MIL/uL — ABNORMAL LOW (ref 4.22–5.81)
RDW: 16.5 % — ABNORMAL HIGH (ref 11.5–15.5)
WBC Count: 4 10*3/uL (ref 4.0–10.5)
nRBC: 0 % (ref 0.0–0.2)

## 2021-07-23 MED ORDER — SODIUM CHLORIDE 0.9% FLUSH
10.0000 mL | Freq: Once | INTRAVENOUS | Status: AC
  Administered 2021-07-23: 14:00:00 10 mL

## 2021-07-23 MED ORDER — HEPARIN SOD (PORK) LOCK FLUSH 100 UNIT/ML IV SOLN
500.0000 [IU] | Freq: Once | INTRAVENOUS | Status: AC
Start: 2021-07-23 — End: 2021-07-23
  Administered 2021-07-23: 14:00:00 500 [IU]

## 2021-07-23 NOTE — Progress Notes (Signed)
[place] [phone]   [phone]  PROGRESS NOTE  Patient Care Team: Pcp, No as PCP - General Orson Slick, MD as Consulting Physician (Medical Oncology) Royston Bake, RN as Oncology Nurse Navigator (Oncology)  Hematological/Oncological History # Rectal Adenocarcinoma, T4N2M0. Stage IIIc 01/16/2021: presented to the emergency department with abdominal pain. CT abdomen/pelvis showed concern for mass lesion with perforation and surrounding adenopathy. 01/19/2021: repeat CT scan showed worsening inflammatory changes around the complex rectal process 01/21/2021: flexible sigmoidoscopy performed, lesion at 12 cm from the anal verge.  biopsy showed concern for adenocarcinoma. 01/21/2021: patient underwent a laparoscopic assisted loop colostomy with Dr. Georgette Dover. 02/09/2021: establish care with Dr. Lorenso Courier  03/05/2021: Cycle 1 Day 1 of neoadjuvant FOLFOX 03/18/2021: Cycle 2 Day 1 of neoadjuvant FOLFOX 04/02/2021: Cycle 3 Day 1 of neoadjuvant FOLFOX 04/15/2021: Cycle 4 Day 1 of neoadjuvant FOLFOX 04/30/2021: Cycle 5 Day 1 of neoadjuvant FOLFOX 05/14/2021: Cycle 6 Day 1 of neoadjuvant FOLFOX 05/27/2021: Cycle 7 Day 1 of neoadjuvant FOLFOX 06/10/2021: Cycle 8 Day 1 of neoadjuvant FOLFOX delayed due to neutropenia.  06/22/2021: Cycle 8 Day 1 of neoadjuvant FOLFOX 07/13/2021: start of Chemoradiation with Xeloda 825 mg/m2 BID  Interval History:  Curtis [name] 65 y.o. male with medical history significant for rectal adenocarcinoma who presents for a follow up visit. The patient's last visit was on 07/09/2021. In the interim since last visit he has started chemoradiation and is tolerated it well.  On exam today Curtis [name] reports he has been having some output from his rectum since he started radiation therapy.  His ostomy output has remained regular.  He notes he is not having any nausea or vomiting but is alarmed that he is passing some semisolid stools from his rectum.   He notes that he is having some dryness of his hands and arms but otherwise is not having any pain or cracking of his hands or feet.  He notes that he is not having any pain unfortunately has gained 2 pounds in the interim since her last visit.  He denies any fevers, chills, night sweats, shortness of breath, chest pain, cough, neuropathy, mucositis or rash. A full 10 point ROS is listed below.  Overall he is willing and able and prepared to start treatment with chemoradiation.  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[place])     SURGICAL HISTORY: Past Surgical History:  Procedure Laterality Date   BIOPSY  01/21/2021   Procedure: BIOPSY;  Surgeon: Mauri Pole, MD;  Location: [place];  Service: Endoscopy;;   COLON RESECTION N/A 01/21/2021   Procedure: LAPAROSCOPIC ASSISTED LOOP COLOSTOMY;  Surgeon: Donnie Mesa, MD;  Location: [place];  Service: General;  Laterality: N/A;   FLEXIBLE SIGMOIDOSCOPY N/A 01/21/2021   Procedure: FLEXIBLE SIGMOIDOSCOPY;  Surgeon: Mauri Pole, MD;  Location: [place];  Service: Endoscopy;  Laterality: N/A;   IR IMAGING GUIDED PORT INSERTION  03/04/2021   SUBMUCOSAL TATTOO INJECTION  01/21/2021   Procedure: SUBMUCOSAL TATTOO INJECTION;  Surgeon: Mauri Pole, MD;  Location: [facility];  Service: Endoscopy;;    SOCIAL HISTORY: Social History   Socioeconomic History   Marital status: Divorced    Spouse name: Not on file   Number of children: Not on file   Years of education: Not on file   Highest education level: Not on file  Occupational History   Not on file  Tobacco Use   Smoking status: Every Day  Types: Cigarettes   Smokeless tobacco: Never  Vaping Use   Vaping Use: Never used  Substance and Sexual Activity   Alcohol use: Never   Drug use: Never   Sexual activity: Never  Other Topics Concern   Not on file  Social History Narrative   Not on file   Social Determinants of  Health   Financial Resource Strain: Low Risk    Difficulty of Paying Living Expenses: Not very hard  Food Insecurity: No Food Insecurity   Worried About Running Out of Food in the Last Year: Never true   Ran Out of Food in the Last Year: Never true  Transportation Needs: No Transportation Needs   Lack of Transportation (Medical): No   Lack of Transportation (Non-Medical): No  Physical Activity: Not on file  Stress: Not on file  Social Connections: Not on file  Intimate Partner Violence: Not At Risk   Fear of Current or Ex-Partner: No   Emotionally Abused: No   Physically Abused: No   Sexually Abused: No    FAMILY HISTORY: Family History  Problem Relation Age of Onset   Hypertension Mother    Hypertension Father    Breast cancer Sister     ALLERGIES:  is allergic to leucovorin and oxaliplatin.  MEDICATIONS:  Current Outpatient Medications  Medication Sig Dispense Refill   acetaminophen (TYLENOL) 500 MG tablet Take 1,000 mg by mouth daily.     capecitabine (XELODA) 500 MG tablet Take 3 tablets (1,500 mg total) by mouth 2 (two) times daily after a meal. Take only on days of radiation Monday - Friday. 210 tablet 0   glipiZIDE (GLUCOTROL) 5 MG tablet Take 1 tablet (5 mg total) by mouth daily before breakfast. 30 tablet 1   KLOR-CON M20 20 MEQ tablet Take 1 tablet by mouth twice daily 30 tablet 0   lidocaine-prilocaine (EMLA) cream Apply 1 application topically as needed. 30 g 0   nystatin (MYCOSTATIN) 100000 UNIT/ML suspension TAKE 5 ML BY MOUTH  4 TIMES DAILY FOR 7 DAYS 140 mL 0   ondansetron (ZOFRAN) 8 MG tablet Take 1 tablet (8 mg total) by mouth every 8 (eight) hours as needed. (Patient not taking: Reported on 06/10/2021) 30 tablet 0   prochlorperazine (COMPAZINE) 10 MG tablet Take 1 tablet (10 mg total) by mouth every 6 (six) hours as needed for nausea or vomiting. (Patient not taking: Reported on 06/10/2021) 30 tablet 0   No current facility-administered medications for  this visit.    REVIEW OF SYSTEMS:   Constitutional: ( - ) fevers, ( - )  chills , ( - ) night sweats Eyes: ( - ) blurriness of vision, ( - ) double vision, ( - ) watery eyes Ears, nose, mouth, throat, and face: ( - ) mucositis, ( - ) sore throat Respiratory: ( - ) cough, ( - ) dyspnea, ( - ) wheezes Cardiovascular: ( - ) palpitation, ( - ) chest discomfort, ( - ) lower extremity swelling Gastrointestinal:  ( - ) nausea, ( - ) heartburn, ( - ) change in bowel habits Skin: ( - ) abnormal skin rashes Lymphatics: ( - ) new lymphadenopathy, ( - ) easy bruising Neurological: ( - ) numbness, ( - ) tingling, ( - ) new weaknesses Behavioral/Psych: ( - ) mood change, ( - ) new changes  All other systems were reviewed with the patient and are negative.  PHYSICAL EXAMINATION: ECOG PERFORMANCE STATUS: 1 - Symptomatic but completely ambulatory  Vitals:   07/23/21  1428  BP: (!) 144/84  Pulse: 77  Resp: 17  Temp: (!) 96.8 F (36 C)  SpO2: 100%     Filed Weights   07/23/21 1428  Weight: 182 lb 14.4 oz (83 kg)     GENERAL: Well-appearing middle-aged [name], alert, no distress and comfortable SKIN: skin color, texture, turgor are normal, no rashes or significant lesions THROAT: No mucositis. No overt bleeding or evidence of infection.  EYES: conjunctiva are pink and non-injected, sclera clear LUNGS: clear to auscultation and percussion with normal breathing effort HEART: regular rate & rhythm and no murmurs and no lower extremity edema ABDOMEN: soft, non-tender, non-distended, normal bowel sounds.  Ostomy clean dry and intact  Musculoskeletal: no cyanosis of digits and no clubbing  PSYCH: alert & oriented x 3, fluent speech NEURO: no focal motor/sensory deficits  LABORATORY DATA:  I have reviewed the data as listed CBC Latest Ref Rng & Units 07/23/2021 07/09/2021 06/22/2021  WBC 4.0 - 10.5 K/uL 4.0 3.7(L) 7.9  Hemoglobin 13.0 - 17.0 g/dL 9.8(L) 9.9(L) 10.3(L)  Hematocrit 39.0 -  52.0 % 29.9(L) 29.7(L) 30.9(L)  Platelets 150 - 400 K/uL 153 178 191    CMP Latest Ref Rng & Units 07/23/2021 07/09/2021 06/22/2021  Glucose 70 - 99 mg/dL 153(H) 76 100(H)  BUN 8 - 23 mg/dL 11 9 16   Creatinine 0.61 - 1.24 mg/dL 1.01 0.73 1.10  Sodium 135 - 145 mmol/L 139 141 141  Potassium 3.5 - 5.1 mmol/L 3.9 3.5 3.1(L)  Chloride 98 - 111 mmol/L 107 111 107  CO2 22 - 32 mmol/L 27 23 25   Calcium 8.9 - 10.3 mg/dL 9.5 8.7(L) 8.6(L)  Total Protein 6.5 - 8.1 g/dL 6.9 7.2 6.7  Total Bilirubin 0.3 - 1.2 mg/dL 0.4 0.3 0.4  Alkaline Phos 38 - 126 U/L 73 92 100  AST 15 - 41 U/L 19 22 19   ALT 0 - 44 U/L 17 18 13     RADIOGRAPHIC STUDIES: I have personally reviewed the radiological images as listed and agreed with the findings in the report: no evidence of metastatic diease in the chest. MRI abdomen does not show metastatic disease.   No results found.  ASSESSMENT & PLAN Curtis [name] 65 y.o. male with medical history significant for rectal adenocarcinoma who presents for a follow up visit.   At this time the patient's findings are most consistent with localized rectal adenocarcinoma.  MRI of the pelvis confirmed this is a T4 N2 M0 stage IIIc cancer.  As such we will plan to proceed with neoadjuvant chemotherapy, followed by chemoradiation, followed by reevaluation for consideration of transabdominal resection.  The initial treatment of choice would be neoadjuvant FOLFOX chemotherapy for 8 cycles.  Once this is complete we will transition to chemoradiation with p.o. capecitabine therapy.  When these are complete we will restage for consideration of surgical resection.  # Rectal Adenocarcinoma, T4N2M0 Stage IIIc --  staging with a CT scan of the chest and MRI abdomen/pelvis findings are consistent with localized disease.   --CEA modestly elevated at 17.44 on 02/09/2021 prior to start of therapy. --Given that this is adenocarcinoma of the rectum I would recommend proceeding with neoadjuvant  chemotherapy, followed by chemoradiation, followed by definitive surgery. Case discussed at [facility] --Patient has established care with Dr. Lisbeth Renshaw and radiation oncology. Plan: --patient completed 8 cycles of neoadjuvant FOLFOX --started chemoradiation therapy with Xeloda on 07/13/2021.  --Need c-scope via colostomy before surgery as recommended by Dr. Hester Mates ([place]). GI has been  contacted for f/u.  --Dosage of capecitabine to be administered with chemotherapy is 825 mg per metered squared p.o. twice daily on days radiation is received. --RTC in 2 weeks to assure he is tolerating Xeloda with radiation.   # Hyperglyclemia --BG elevated, patient has no history of DM type II --BG 153 today, abnormal elevations since 04/15/2021  --currently on glipizide 5mg  PO daily --continue to monitor.   # Neutropenia, chemotherapy induced.  --ANC is 2900 today --OK to proceed with Xeloda with radiation next week.   #Anemia #Thrombocyotpenia --Hgb noted at 9.8, stable from prior --Plt 153K today, stable from prior cycle. --likely secondary to active malignancy and modest iron deficiency. Some contribution from chemotherapy as well.  --continue to monitor  #Hypokalemia: --Today's potassium is 3.9. --continue 40 mEq of oral potassium daily  #Supportive Care -- chemotherapy education complete -- port placed -- zofran 8mg  q8H PRN and compazine 10mg  PO q6H for nausea -- EMLA cream for port -- no pain medication required at this time.   No orders of the defined types were placed in this encounter.   All questions were answered. The patient knows to call the clinic with any problems, questions or concerns.  I have spent a total of 30 minutes minutes of face-to-face and non-face-to-face time, preparing to see the patient, obtaining and/or reviewing separately obtained history, performing a medically appropriate examination, counseling and educating the patient, ordering medications,  communicating  with other health care professionals, documenting clinical information in the electronic health record, and care coordination.   Ledell Peoples, MD Department of Hematology/Oncology [place] at [facility] Phone: [phone] Pager: [phone] Email: [email or web address]   07/23/2021 2:30 PM  Hofheinz RD, Raynald Blend, Post S, Matzdorff A, Laechelt S, Edrick Oh, Mller L, Link H, Moehler M, Stacey Drain, [place], [place], [place], Hipp M, Hartung G, Gencer D, [place], [place] I, Hochhaus A. Chemoradiotherapy with capecitabine versus fluorouracil for locally advanced rectal cancer: a randomised, multicentre, non-inferiority, phase 3 trial. Lancet Oncol. 2012 Jun;13(6):579-88.

## 2021-07-24 ENCOUNTER — Ambulatory Visit
Admission: RE | Admit: 2021-07-24 | Discharge: 2021-07-24 | Disposition: A | Discharge status: Home / Self Care | Payer: Self-pay | Source: Ambulatory Visit | Attending: Radiation Oncology | Admitting: Radiation Oncology

## 2021-07-28 ENCOUNTER — Other Ambulatory Visit: Payer: Self-pay

## 2021-07-28 ENCOUNTER — Ambulatory Visit
Admission: RE | Admit: 2021-07-28 | Discharge: 2021-07-28 | Disposition: A | Discharge status: Home / Self Care | Payer: Self-pay | Source: Ambulatory Visit | Attending: Radiation Oncology | Admitting: Radiation Oncology

## 2021-07-28 DIAGNOSIS — C20 Malignant neoplasm of rectum: Secondary | ICD-10-CM | POA: Insufficient documentation

## 2021-07-28 DIAGNOSIS — Z510 Encounter for antineoplastic radiation therapy: Secondary | ICD-10-CM | POA: Insufficient documentation

## 2021-07-29 ENCOUNTER — Ambulatory Visit
Admission: RE | Admit: 2021-07-29 | Discharge: 2021-07-29 | Disposition: A | Discharge status: Home / Self Care | Payer: Self-pay | Source: Ambulatory Visit | Attending: Radiation Oncology | Admitting: Radiation Oncology

## 2021-07-30 ENCOUNTER — Other Ambulatory Visit: Payer: Self-pay | Admitting: Hematology and Oncology

## 2021-07-30 ENCOUNTER — Other Ambulatory Visit: Payer: Self-pay

## 2021-07-30 ENCOUNTER — Ambulatory Visit
Admission: RE | Admit: 2021-07-30 | Discharge: 2021-07-30 | Disposition: A | Discharge status: Home / Self Care | Payer: Self-pay | Source: Ambulatory Visit | Attending: Radiation Oncology | Admitting: Radiation Oncology

## 2021-07-31 ENCOUNTER — Ambulatory Visit
Admission: RE | Admit: 2021-07-31 | Discharge: 2021-07-31 | Disposition: A | Discharge status: Home / Self Care | Payer: Self-pay | Source: Ambulatory Visit | Attending: Radiation Oncology | Admitting: Radiation Oncology

## 2021-08-02 NOTE — Progress Notes (Signed)
[place] [phone]   [phone]  PROGRESS NOTE  Patient Care Team: Pcp, No as PCP - General Orson Slick, MD as Consulting Physician (Medical Oncology) Royston Bake, RN as Oncology Nurse Navigator (Oncology)  Hematological/Oncological History # Rectal Adenocarcinoma, T4N2M0. Stage IIIc 01/16/2021: presented to the emergency department with abdominal pain. CT abdomen/pelvis showed concern for mass lesion with perforation and surrounding adenopathy. 01/19/2021: repeat CT scan showed worsening inflammatory changes around the complex rectal process 01/21/2021: flexible sigmoidoscopy performed, lesion at 12 cm from the anal verge.  biopsy showed concern for adenocarcinoma. 01/21/2021: patient underwent a laparoscopic assisted loop colostomy with Dr. Georgette Dover. 02/09/2021: establish care with Dr. Lorenso Courier  03/05/2021: Cycle 1 Day 1 of neoadjuvant FOLFOX 03/18/2021: Cycle 2 Day 1 of neoadjuvant FOLFOX 04/02/2021: Cycle 3 Day 1 of neoadjuvant FOLFOX 04/15/2021: Cycle 4 Day 1 of neoadjuvant FOLFOX 04/30/2021: Cycle 5 Day 1 of neoadjuvant FOLFOX 05/14/2021: Cycle 6 Day 1 of neoadjuvant FOLFOX 05/27/2021: Cycle 7 Day 1 of neoadjuvant FOLFOX 06/10/2021: Cycle 8 Day 1 of neoadjuvant FOLFOX delayed due to neutropenia.  06/22/2021: Cycle 8 Day 1 of neoadjuvant FOLFOX 07/13/2021: start of Chemoradiation with Xeloda 825 mg/m2 BID  Interval History:  Curtis [name] 66 y.o. male with medical history significant for rectal adenocarcinoma who presents for a follow up visit. The patient's last visit was on 07/23/2021. In the interim since last visit he has continued chemoradiation and has tolerated it well.  On exam today Curtis [name] reports he has been doing quite well in the interim since her last visit.  He reports his appetite is back and he is eating well.  He is also quite thrilled to no longer have issues with cold intolerance.  He reports that he has been taking his Xeloda  pills as prescribed with no issues of nausea or vomiting.  His ostomy output has been stable though he did have 1 episode with the bag where he had accidental leakage.  Now he keeps a roll of paper towels in his car in order to try to help assist with this.  He notes that he is not having any loose watery output into the ostomy bag.  His energy levels have been good and he has not noticed any side effects as result of his radiation treatment.  Overall he is willing and able to proceed with treatment.  He denies any fevers, chills, night sweats, shortness of breath, chest pain, cough, neuropathy, mucositis or rash. A full 10 point ROS is listed below.  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[place])     SURGICAL HISTORY: Past Surgical History:  Procedure Laterality Date   BIOPSY  01/21/2021   Procedure: BIOPSY;  Surgeon: Mauri Pole, MD;  Location: [facility];  Service: Endoscopy;;   COLON RESECTION N/A 01/21/2021   Procedure: LAPAROSCOPIC ASSISTED LOOP COLOSTOMY;  Surgeon: Donnie Mesa, MD;  Location: [facility];  Service: General;  Laterality: N/A;   FLEXIBLE SIGMOIDOSCOPY N/A 01/21/2021   Procedure: FLEXIBLE SIGMOIDOSCOPY;  Surgeon: Mauri Pole, MD;  Location: [facility];  Service: Endoscopy;  Laterality: N/A;   IR IMAGING GUIDED PORT INSERTION  03/04/2021   SUBMUCOSAL TATTOO INJECTION  01/21/2021   Procedure: SUBMUCOSAL TATTOO INJECTION;  Surgeon: Mauri Pole, MD;  Location: [facility];  Service: Endoscopy;;    SOCIAL HISTORY: Social History   Socioeconomic History   Marital status: Divorced    Spouse name: Not on file  Number of children: Not on file   Years of education: Not on file   Highest education level: Not on file  Occupational History   Not on file  Tobacco Use   Smoking status: Every Day    Types: Cigarettes   Smokeless tobacco: Never  Vaping Use   Vaping Use: Never used  Substance and Sexual Activity    Alcohol use: Never   Drug use: Never   Sexual activity: Never  Other Topics Concern   Not on file  Social History Narrative   Not on file   Social Determinants of Health   Financial Resource Strain: Low Risk    Difficulty of Paying Living Expenses: Not very hard  Food Insecurity: No Food Insecurity   Worried About Running Out of Food in the Last Year: Never true   [place] in the Last Year: Never true  Transportation Needs: No Transportation Needs   Lack of Transportation (Medical): No   Lack of Transportation (Non-Medical): No  Physical Activity: Not on file  Stress: Not on file  Social Connections: Not on file  Intimate Partner Violence: Not At Risk   Fear of Current or Ex-Partner: No   Emotionally Abused: No   Physically Abused: No   Sexually Abused: No    FAMILY HISTORY: Family History  Problem Relation Age of Onset   Hypertension Mother    Hypertension Father    Breast cancer Sister     ALLERGIES:  is allergic to leucovorin and oxaliplatin.  MEDICATIONS:  Current Outpatient Medications  Medication Sig Dispense Refill   acetaminophen (TYLENOL) 500 MG tablet Take 1,000 mg by mouth daily.     capecitabine (XELODA) 500 MG tablet Take 3 tablets (1,500 mg total) by mouth 2 (two) times daily after a meal. Take only on days of radiation Monday - Friday. 210 tablet 0   glipiZIDE (GLUCOTROL) 5 MG tablet Take 1 tablet (5 mg total) by mouth daily before breakfast. 30 tablet 1   KLOR-CON M20 20 MEQ tablet Take 1 tablet by mouth twice daily 30 tablet 0   lidocaine-prilocaine (EMLA) cream Apply 1 application topically as needed. 30 g 0   nystatin (MYCOSTATIN) 100000 UNIT/ML suspension TAKE 5 ML BY MOUTH  4 TIMES DAILY FOR 7 DAYS 140 mL 0   ondansetron (ZOFRAN) 8 MG tablet Take 1 tablet (8 mg total) by mouth every 8 (eight) hours as needed. (Patient not taking: Reported on 06/10/2021) 30 tablet 0   prochlorperazine (COMPAZINE) 10 MG tablet Take 1 tablet (10 mg total) by  mouth every 6 (six) hours as needed for nausea or vomiting. (Patient not taking: Reported on 06/10/2021) 30 tablet 0   No current facility-administered medications for this visit.    REVIEW OF SYSTEMS:   Constitutional: ( - ) fevers, ( - )  chills , ( - ) night sweats Eyes: ( - ) blurriness of vision, ( - ) double vision, ( - ) watery eyes Ears, nose, mouth, throat, and face: ( - ) mucositis, ( - ) sore throat Respiratory: ( - ) cough, ( - ) dyspnea, ( - ) wheezes Cardiovascular: ( - ) palpitation, ( - ) chest discomfort, ( - ) lower extremity swelling Gastrointestinal:  ( - ) nausea, ( - ) heartburn, ( - ) change in bowel habits Skin: ( - ) abnormal skin rashes Lymphatics: ( - ) new lymphadenopathy, ( - ) easy bruising Neurological: ( - ) numbness, ( - ) tingling, ( - )  new weaknesses Behavioral/Psych: ( - ) mood change, ( - ) new changes  All other systems were reviewed with the patient and are negative.  PHYSICAL EXAMINATION: ECOG PERFORMANCE STATUS: 1 - Symptomatic but completely ambulatory  Vitals:   08/03/21 1521  BP: (!) 143/72  Pulse: 72  Resp: 17  Temp: 97.6 F (36.4 C)  SpO2: 100%    Filed Weights   08/03/21 1521  Weight: 188 lb 5 oz (85.4 kg)     GENERAL: Well-appearing middle-aged [name], alert, no distress and comfortable SKIN: skin color, texture, turgor are normal, no rashes or significant lesions THROAT: No mucositis. No overt bleeding or evidence of infection.  EYES: conjunctiva are pink and non-injected, sclera clear LUNGS: clear to auscultation and percussion with normal breathing effort HEART: regular rate & rhythm and no murmurs and no lower extremity edema ABDOMEN: soft, non-tender, non-distended, normal bowel sounds.  Ostomy clean dry and intact  Musculoskeletal: no cyanosis of digits and no clubbing  PSYCH: alert & oriented x 3, fluent speech NEURO: no focal motor/sensory deficits  LABORATORY DATA:  I have reviewed the data as listed CBC  Latest Ref Rng & Units 08/03/2021 07/23/2021 07/09/2021  WBC 4.0 - 10.5 K/uL 3.5(L) 4.0 3.7(L)  Hemoglobin 13.0 - 17.0 g/dL 10.0(L) 9.8(L) 9.9(L)  Hematocrit 39.0 - 52.0 % 30.0(L) 29.9(L) 29.7(L)  Platelets 150 - 400 K/uL 180 153 178    CMP Latest Ref Rng & Units 08/03/2021 07/23/2021 07/09/2021  Glucose 70 - 99 mg/dL 81 153(H) 76  BUN 8 - 23 mg/dL 13 11 9   Creatinine 0.61 - 1.24 mg/dL 0.73 1.01 0.73  Sodium 135 - 145 mmol/L 139 139 141  Potassium 3.5 - 5.1 mmol/L 3.7 3.9 3.5  Chloride 98 - 111 mmol/L 108 107 111  CO2 22 - 32 mmol/L 25 27 23   Calcium 8.9 - 10.3 mg/dL 9.0 9.5 8.7(L)  Total Protein 6.5 - 8.1 g/dL 7.1 6.9 7.2  Total Bilirubin 0.3 - 1.2 mg/dL 0.3 0.4 0.3  Alkaline Phos 38 - 126 U/L 75 73 92  AST 15 - 41 U/L 18 19 22   ALT 0 - 44 U/L 14 17 18     RADIOGRAPHIC STUDIES: I have personally reviewed the radiological images as listed and agreed with the findings in the report: no evidence of metastatic diease in the chest. MRI abdomen does not show metastatic disease.   No results found.  ASSESSMENT & PLAN Curtis [name] 66 y.o. male with medical history significant for rectal adenocarcinoma who presents for a follow up visit.   At this time the patient's findings are most consistent with localized rectal adenocarcinoma.  MRI of the pelvis confirmed this is a T4 N2 M0 stage IIIc cancer.  As such we will plan to proceed with neoadjuvant chemotherapy, followed by chemoradiation, followed by reevaluation for consideration of transabdominal resection.  The initial treatment of choice would be neoadjuvant FOLFOX chemotherapy for 8 cycles.  Once this is complete we will transition to chemoradiation with p.o. capecitabine therapy.  When these are complete we will restage for consideration of surgical resection.  # Rectal Adenocarcinoma, T4N2M0 Stage IIIc --  staging with a CT scan of the chest and MRI abdomen/pelvis findings are consistent with localized disease.   --CEA modestly elevated  at 17.44 on 02/09/2021 prior to start of therapy. --Given that this is adenocarcinoma of the rectum I would recommend proceeding with neoadjuvant chemotherapy, followed by chemoradiation, followed by definitive surgery. Case discussed at [facility] --Patient has established  care with Dr. Lisbeth Renshaw and radiation oncology. Plan: --patient completed 8 cycles of neoadjuvant FOLFOX --started chemoradiation therapy with Xeloda on 07/13/2021.  --Need c-scope via colostomy before surgery as recommended by Dr. Hester Mates ([place]). GI has been contacted for f/u.  --Dosage of capecitabine to be administered with chemotherapy is 825 mg per metered squared p.o. twice daily on days radiation is received. --RTC in 2 weeks to assure he is tolerating Xeloda with radiation.   # Hyperglyclemia --BG elevated, patient has no history of DM type II --BG 81 today, abnormal elevations since 04/15/2021  --currently on glipizide 5mg  PO daily --continue to monitor.   # Neutropenia, chemotherapy induced.  --ANC is 2300 today --OK to proceed with Xeloda with radiation next week.   #Anemia #Thrombocyotpenia --Hgb noted at 10.0, stable from prior --Plt 180K today, stable from prior cycle. --likely secondary to active malignancy and modest iron deficiency. Some contribution from chemotherapy as well.  --continue to monitor  #Hypokalemia: --Today's potassium is 3.7. --continue 40 mEq of oral potassium daily  #Supportive Care -- chemotherapy education complete -- port placed -- zofran 8mg  q8H PRN and compazine 10mg  PO q6H for nausea -- EMLA cream for port -- no pain medication required at this time.   No orders of the defined types were placed in this encounter.   All questions were answered. The patient knows to call the clinic with any problems, questions or concerns.  I have spent a total of 30 minutes minutes of face-to-face and non-face-to-face time, preparing to see the patient, obtaining and/or reviewing  separately obtained history, performing a medically appropriate examination, counseling and educating the patient, ordering medications,  communicating with other health care professionals, documenting clinical information in the electronic health record, and care coordination.   Ledell Peoples, MD Department of Hematology/Oncology [place] at [facility] Phone: [phone] Pager: [phone] Email: [email or web address]   08/03/2021 4:07 PM  Hofheinz RD, Raynald Blend, Post S, Matzdorff A, Laechelt S, Edrick Oh, Mller L, Link H, Moehler M, Stacey Drain, [place], [place], [place], Hipp M, Hartung G, Gencer D, Kienle P, [place] I, Hochhaus A. Chemoradiotherapy with capecitabine versus fluorouracil for locally advanced rectal cancer: a randomised, multicentre, non-inferiority, phase 3 trial. Lancet Oncol. 2012 Jun;13(6):579-88.

## 2021-08-03 ENCOUNTER — Inpatient Hospital Stay: Payer: Self-pay | Attending: Hematology and Oncology | Admitting: Hematology and Oncology

## 2021-08-03 ENCOUNTER — Inpatient Hospital Stay: Payer: Self-pay

## 2021-08-03 ENCOUNTER — Ambulatory Visit
Admission: RE | Admit: 2021-08-03 | Discharge: 2021-08-03 | Disposition: A | Discharge status: Home / Self Care | Payer: Self-pay | Source: Ambulatory Visit | Attending: Radiation Oncology | Admitting: Radiation Oncology

## 2021-08-03 ENCOUNTER — Other Ambulatory Visit: Payer: Self-pay

## 2021-08-03 VITALS — BP 143/72 | HR 72 | Temp 97.6°F | Resp 17 | Wt 188.3 lb

## 2021-08-03 DIAGNOSIS — Z8249 Family history of ischemic heart disease and other diseases of the circulatory system: Secondary | ICD-10-CM | POA: Insufficient documentation

## 2021-08-03 DIAGNOSIS — Z923 Personal history of irradiation: Secondary | ICD-10-CM | POA: Insufficient documentation

## 2021-08-03 DIAGNOSIS — E876 Hypokalemia: Secondary | ICD-10-CM | POA: Insufficient documentation

## 2021-08-03 DIAGNOSIS — R109 Unspecified abdominal pain: Secondary | ICD-10-CM | POA: Insufficient documentation

## 2021-08-03 DIAGNOSIS — F17210 Nicotine dependence, cigarettes, uncomplicated: Secondary | ICD-10-CM | POA: Insufficient documentation

## 2021-08-03 DIAGNOSIS — C20 Malignant neoplasm of rectum: Secondary | ICD-10-CM | POA: Insufficient documentation

## 2021-08-03 DIAGNOSIS — T451X5A Adverse effect of antineoplastic and immunosuppressive drugs, initial encounter: Secondary | ICD-10-CM | POA: Insufficient documentation

## 2021-08-03 DIAGNOSIS — Z79899 Other long term (current) drug therapy: Secondary | ICD-10-CM | POA: Insufficient documentation

## 2021-08-03 DIAGNOSIS — Z95828 Presence of other vascular implants and grafts: Secondary | ICD-10-CM

## 2021-08-03 DIAGNOSIS — Z803 Family history of malignant neoplasm of breast: Secondary | ICD-10-CM | POA: Insufficient documentation

## 2021-08-03 DIAGNOSIS — D649 Anemia, unspecified: Secondary | ICD-10-CM | POA: Insufficient documentation

## 2021-08-03 DIAGNOSIS — D701 Agranulocytosis secondary to cancer chemotherapy: Secondary | ICD-10-CM | POA: Insufficient documentation

## 2021-08-03 LAB — CBC WITH DIFFERENTIAL (CANCER CENTER ONLY)
Abs Immature Granulocytes: 0.01 10*3/uL (ref 0.00–0.07)
Basophils Absolute: 0 10*3/uL (ref 0.0–0.1)
Basophils Relative: 1 %
Eosinophils Absolute: 0.2 10*3/uL (ref 0.0–0.5)
Eosinophils Relative: 7 %
HCT: 30 % — ABNORMAL LOW (ref 39.0–52.0)
Hemoglobin: 10 g/dL — ABNORMAL LOW (ref 13.0–17.0)
Immature Granulocytes: 0 %
Lymphocytes Relative: 16 %
Lymphs Abs: 0.6 10*3/uL — ABNORMAL LOW (ref 0.7–4.0)
MCH: 31 pg (ref 26.0–34.0)
MCHC: 33.3 g/dL (ref 30.0–36.0)
MCV: 92.9 fL (ref 80.0–100.0)
Monocytes Absolute: 0.3 10*3/uL (ref 0.1–1.0)
Monocytes Relative: 10 %
Neutro Abs: 2.3 10*3/uL (ref 1.7–7.7)
Neutrophils Relative %: 66 %
Platelet Count: 180 10*3/uL (ref 150–400)
RBC: 3.23 MIL/uL — ABNORMAL LOW (ref 4.22–5.81)
RDW: 16.4 % — ABNORMAL HIGH (ref 11.5–15.5)
WBC Count: 3.5 10*3/uL — ABNORMAL LOW (ref 4.0–10.5)
nRBC: 0 % (ref 0.0–0.2)

## 2021-08-03 LAB — CMP (CANCER CENTER ONLY)
ALT: 14 U/L (ref 0–44)
AST: 18 U/L (ref 15–41)
Albumin: 3.9 g/dL (ref 3.5–5.0)
Alkaline Phosphatase: 75 U/L (ref 38–126)
Anion gap: 6 (ref 5–15)
BUN: 13 mg/dL (ref 8–23)
CO2: 25 mmol/L (ref 22–32)
Calcium: 9 mg/dL (ref 8.9–10.3)
Chloride: 108 mmol/L (ref 98–111)
Creatinine: 0.73 mg/dL (ref 0.61–1.24)
GFR, Estimated: >60 mL/min (ref >60)
Glucose, Bld: 81 mg/dL (ref 70–99)
Potassium: 3.7 mmol/L (ref 3.5–5.1)
Sodium: 139 mmol/L (ref 135–145)
Total Bilirubin: 0.3 mg/dL (ref 0.3–1.2)
Total Protein: 7.1 g/dL (ref 6.5–8.1)

## 2021-08-03 MED ORDER — HEPARIN SOD (PORK) LOCK FLUSH 100 UNIT/ML IV SOLN
500.0000 [IU] | Freq: Once | INTRAVENOUS | Status: AC
  Administered 2021-08-03: 500 [IU]

## 2021-08-03 MED ORDER — SODIUM CHLORIDE 0.9% FLUSH
10.0000 mL | Freq: Once | INTRAVENOUS | Status: AC
  Administered 2021-08-03: 10 mL

## 2021-08-04 ENCOUNTER — Ambulatory Visit
Admission: RE | Admit: 2021-08-04 | Discharge: 2021-08-04 | Disposition: A | Discharge status: Home / Self Care | Payer: Self-pay | Source: Ambulatory Visit | Attending: Radiation Oncology | Admitting: Radiation Oncology

## 2021-08-05 ENCOUNTER — Ambulatory Visit
Admission: RE | Admit: 2021-08-05 | Discharge: 2021-08-05 | Disposition: A | Discharge status: Home / Self Care | Payer: Self-pay | Source: Ambulatory Visit | Attending: Radiation Oncology | Admitting: Radiation Oncology

## 2021-08-05 ENCOUNTER — Other Ambulatory Visit: Payer: Self-pay

## 2021-08-06 ENCOUNTER — Ambulatory Visit
Admission: RE | Admit: 2021-08-06 | Discharge: 2021-08-06 | Disposition: A | Discharge status: Home / Self Care | Payer: Self-pay | Source: Ambulatory Visit | Attending: Radiation Oncology | Admitting: Radiation Oncology

## 2021-08-07 ENCOUNTER — Telehealth: Payer: Self-pay

## 2021-08-07 ENCOUNTER — Ambulatory Visit
Admission: RE | Admit: 2021-08-07 | Discharge: 2021-08-07 | Disposition: A | Discharge status: Home / Self Care | Payer: Self-pay | Source: Ambulatory Visit | Attending: Radiation Oncology | Admitting: Radiation Oncology

## 2021-08-07 ENCOUNTER — Other Ambulatory Visit: Payer: Self-pay

## 2021-08-07 NOTE — Telephone Encounter (Signed)
Spoke with patient in lobby. Patient had questions about area surrounding his stoma. He reports it as being uncomfortable. Called patient to provide phone number for [facility]. Advised that the clinic is open on M - 12-4, Tu 9-4 and F 9-1. Patient aware that he can leave a voice mail at the clinic. He stated that he would reach out to clinic on 08/10/21. Patient aware to call [facility] with further concerns.

## 2021-08-10 ENCOUNTER — Ambulatory Visit
Admission: RE | Admit: 2021-08-10 | Discharge: 2021-08-10 | Disposition: A | Discharge status: Home / Self Care | Payer: Self-pay | Source: Ambulatory Visit | Attending: Radiation Oncology | Admitting: Radiation Oncology

## 2021-08-10 ENCOUNTER — Other Ambulatory Visit: Payer: Self-pay

## 2021-08-11 ENCOUNTER — Ambulatory Visit
Admission: RE | Admit: 2021-08-11 | Discharge: 2021-08-11 | Disposition: A | Discharge status: Home / Self Care | Payer: Self-pay | Source: Ambulatory Visit | Attending: Radiation Oncology | Admitting: Radiation Oncology

## 2021-08-12 ENCOUNTER — Encounter: Payer: Self-pay | Admitting: Radiation Oncology

## 2021-08-12 ENCOUNTER — Ambulatory Visit
Admission: RE | Admit: 2021-08-12 | Discharge: 2021-08-12 | Disposition: A | Discharge status: Home / Self Care | Payer: Self-pay | Source: Ambulatory Visit | Attending: Radiation Oncology | Admitting: Radiation Oncology

## 2021-08-13 ENCOUNTER — Encounter: Payer: Self-pay | Admitting: Gastroenterology

## 2021-08-13 ENCOUNTER — Ambulatory Visit
Admission: RE | Admit: 2021-08-13 | Discharge: 2021-08-13 | Disposition: A | Discharge status: Home / Self Care | Payer: Self-pay | Source: Ambulatory Visit | Attending: Radiation Oncology | Admitting: Radiation Oncology

## 2021-08-13 ENCOUNTER — Other Ambulatory Visit: Payer: Self-pay

## 2021-08-14 ENCOUNTER — Ambulatory Visit
Admission: RE | Admit: 2021-08-14 | Discharge: 2021-08-14 | Disposition: A | Discharge status: Home / Self Care | Payer: Self-pay | Source: Ambulatory Visit | Attending: Radiation Oncology | Admitting: Radiation Oncology

## 2021-08-16 ENCOUNTER — Other Ambulatory Visit: Payer: Self-pay | Admitting: Hematology and Oncology

## 2021-08-16 DIAGNOSIS — C20 Malignant neoplasm of rectum: Secondary | ICD-10-CM

## 2021-08-17 ENCOUNTER — Other Ambulatory Visit: Payer: Self-pay

## 2021-08-17 ENCOUNTER — Ambulatory Visit
Admission: RE | Admit: 2021-08-17 | Discharge: 2021-08-17 | Disposition: A | Discharge status: Home / Self Care | Payer: Self-pay | Source: Ambulatory Visit | Attending: Radiation Oncology | Admitting: Radiation Oncology

## 2021-08-17 ENCOUNTER — Inpatient Hospital Stay (HOSPITAL_BASED_OUTPATIENT_CLINIC_OR_DEPARTMENT_OTHER): Payer: Self-pay | Admitting: Hematology and Oncology

## 2021-08-17 ENCOUNTER — Inpatient Hospital Stay: Payer: Self-pay

## 2021-08-17 VITALS — BP 137/70 | HR 79 | Temp 98.3°F | Resp 17 | Wt 191.9 lb

## 2021-08-17 DIAGNOSIS — C20 Malignant neoplasm of rectum: Secondary | ICD-10-CM

## 2021-08-17 DIAGNOSIS — Z95828 Presence of other vascular implants and grafts: Secondary | ICD-10-CM

## 2021-08-17 LAB — CBC WITH DIFFERENTIAL (CANCER CENTER ONLY)
Abs Immature Granulocytes: 0 10*3/uL (ref 0.00–0.07)
Basophils Absolute: 0 10*3/uL (ref 0.0–0.1)
Basophils Relative: 1 %
Eosinophils Absolute: 0.2 10*3/uL (ref 0.0–0.5)
Eosinophils Relative: 6 %
HCT: 31.3 % — ABNORMAL LOW (ref 39.0–52.0)
Hemoglobin: 10.3 g/dL — ABNORMAL LOW (ref 13.0–17.0)
Immature Granulocytes: 0 %
Lymphocytes Relative: 12 %
Lymphs Abs: 0.5 10*3/uL — ABNORMAL LOW (ref 0.7–4.0)
MCH: 31.1 pg (ref 26.0–34.0)
MCHC: 32.9 g/dL (ref 30.0–36.0)
MCV: 94.6 fL (ref 80.0–100.0)
Monocytes Absolute: 0.3 10*3/uL (ref 0.1–1.0)
Monocytes Relative: 9 %
Neutro Abs: 2.7 10*3/uL (ref 1.7–7.7)
Neutrophils Relative %: 72 %
Platelet Count: 189 10*3/uL (ref 150–400)
RBC: 3.31 MIL/uL — ABNORMAL LOW (ref 4.22–5.81)
RDW: 17.4 % — ABNORMAL HIGH (ref 11.5–15.5)
WBC Count: 3.8 10*3/uL — ABNORMAL LOW (ref 4.0–10.5)
nRBC: 0 % (ref 0.0–0.2)

## 2021-08-17 LAB — CMP (CANCER CENTER ONLY)
ALT: 13 U/L (ref 0–44)
AST: 18 U/L (ref 15–41)
Albumin: 4.1 g/dL (ref 3.5–5.0)
Alkaline Phosphatase: 74 U/L (ref 38–126)
Anion gap: 5 (ref 5–15)
BUN: 12 mg/dL (ref 8–23)
CO2: 26 mmol/L (ref 22–32)
Calcium: 9.2 mg/dL (ref 8.9–10.3)
Chloride: 108 mmol/L (ref 98–111)
Creatinine: 0.85 mg/dL (ref 0.61–1.24)
GFR, Estimated: >60 mL/min (ref >60)
Glucose, Bld: 88 mg/dL (ref 70–99)
Potassium: 3.8 mmol/L (ref 3.5–5.1)
Sodium: 139 mmol/L (ref 135–145)
Total Bilirubin: 0.4 mg/dL (ref 0.3–1.2)
Total Protein: 7.4 g/dL (ref 6.5–8.1)

## 2021-08-17 LAB — MAGNESIUM: Magnesium: 1.7 mg/dL (ref 1.7–2.4)

## 2021-08-17 MED ORDER — SODIUM CHLORIDE 0.9% FLUSH
10.0000 mL | Freq: Once | INTRAVENOUS | Status: AC
  Administered 2021-08-17: 10 mL

## 2021-08-17 MED ORDER — HEPARIN SOD (PORK) LOCK FLUSH 100 UNIT/ML IV SOLN
500.0000 [IU] | Freq: Once | INTRAVENOUS | Status: AC
  Administered 2021-08-17: 500 [IU]

## 2021-08-17 NOTE — Progress Notes (Signed)
[place] Telephone:([phone]) [phone]   Fax:([phone]) [phone]  PROGRESS NOTE  Patient Care Team: Pcp, No as PCP - General Orson Slick, MD as Consulting Physician (Medical Oncology) Royston Bake, RN as Oncology Nurse Navigator (Oncology)  Hematological/Oncological History # Rectal Adenocarcinoma, T4N2M0. Stage IIIc 01/16/2021: presented to the emergency department with abdominal pain. CT abdomen/pelvis showed concern for mass lesion with perforation and surrounding adenopathy. 01/19/2021: repeat CT scan showed worsening inflammatory changes around the complex rectal process 01/21/2021: flexible sigmoidoscopy performed, lesion at 12 cm from the anal verge.  biopsy showed concern for adenocarcinoma. 01/21/2021: patient underwent a laparoscopic assisted loop colostomy with Dr. Georgette Dover. 02/09/2021: establish care with Dr. Lorenso Courier  03/05/2021: Cycle 1 Day 1 of neoadjuvant FOLFOX 03/18/2021: Cycle 2 Day 1 of neoadjuvant FOLFOX 04/02/2021: Cycle 3 Day 1 of neoadjuvant FOLFOX 04/15/2021: Cycle 4 Day 1 of neoadjuvant FOLFOX 04/30/2021: Cycle 5 Day 1 of neoadjuvant FOLFOX 05/14/2021: Cycle 6 Day 1 of neoadjuvant FOLFOX 05/27/2021: Cycle 7 Day 1 of neoadjuvant FOLFOX 06/10/2021: Cycle 8 Day 1 of neoadjuvant FOLFOX delayed due to neutropenia.  06/22/2021: Cycle 8 Day 1 of neoadjuvant FOLFOX 07/13/2021: start of Chemoradiation with Xeloda 825 mg/m2 BID  Interval History:  Curtis [name] 66 y.o. male with medical history significant for rectal adenocarcinoma who presents for a follow up visit. The patient's last visit was on 08/03/2021. In the interim since last visit he has continued chemoradiation and has tolerated it well.  On exam today Curtis [name] been having some difficulty with urination during his radiation therapy.  [name] he has to sit down to urinate as he does have some rectal discharge when he attempts to urinate.  He [name] he is not having any pain.  He notes he is having dry  skin but otherwise has been quite well.  He is not having any other major side effects as result of his chemoradiation treatment.  His appetite is improving and his weight is steadily improving.  Overall he is willing and able to proceed with treatment.  He denies any fevers, chills, night sweats, shortness of breath, chest pain, cough, neuropathy, mucositis or rash. A full 10 point ROS is listed below.  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[place])     SURGICAL HISTORY: Past Surgical History:  Procedure Laterality Date   BIOPSY  01/21/2021   Procedure: BIOPSY;  Surgeon: Mauri Pole, MD;  Location: [place];  Service: Endoscopy;;   COLON RESECTION N/A 01/21/2021   Procedure: LAPAROSCOPIC ASSISTED LOOP COLOSTOMY;  Surgeon: Donnie Mesa, MD;  Location: [place];  Service: General;  Laterality: N/A;   FLEXIBLE SIGMOIDOSCOPY N/A 01/21/2021   Procedure: FLEXIBLE SIGMOIDOSCOPY;  Surgeon: Mauri Pole, MD;  Location: [place];  Service: Endoscopy;  Laterality: N/A;   IR IMAGING GUIDED PORT INSERTION  03/04/2021   SUBMUCOSAL TATTOO INJECTION  01/21/2021   Procedure: SUBMUCOSAL TATTOO INJECTION;  Surgeon: Mauri Pole, MD;  Location: [facility];  Service: Endoscopy;;    SOCIAL HISTORY: Social History   Socioeconomic History   Marital status: Divorced    Spouse name: Not on file   Number of children: Not on file   Years of education: Not on file   Highest education level: Not on file  Occupational History   Not on file  Tobacco Use   Smoking status: Every Day    Types: Cigarettes   Smokeless tobacco: Never  Vaping Use   Vaping Use:  Never used  Substance and Sexual Activity   Alcohol use: Never   Drug use: Never   Sexual activity: Never  Other Topics Concern   Not on file  Social History Narrative   Not on file   Social Determinants of Health   Financial Resource Strain: Low Risk    Difficulty of Paying Living  Expenses: Not very hard  Food Insecurity: No Food Insecurity   Worried About Running Out of Food in the Last Year: Never true   Ran Out of Food in the Last Year: Never true  Transportation Needs: No Transportation Needs   Lack of Transportation (Medical): No   Lack of Transportation (Non-Medical): No  Physical Activity: Not on file  Stress: Not on file  Social Connections: Not on file  Intimate Partner Violence: Not At Risk   Fear of Current or Ex-Partner: No   Emotionally Abused: No   Physically Abused: No   Sexually Abused: No    FAMILY HISTORY: Family History  Problem Relation Age of Onset   Hypertension Mother    Hypertension Father    Breast cancer Sister     ALLERGIES:  is allergic to leucovorin and oxaliplatin.  MEDICATIONS:  Current Outpatient Medications  Medication Sig Dispense Refill   acetaminophen (TYLENOL) 500 MG tablet Take 1,000 mg by mouth daily.     glipiZIDE (GLUCOTROL) 5 MG tablet Take 1 tablet (5 mg total) by mouth daily before breakfast. 30 tablet 1   KLOR-CON M20 20 MEQ tablet Take 1 tablet by mouth twice daily 30 tablet 0   lidocaine-prilocaine (EMLA) cream Apply 1 application topically as needed. 30 g 0   nystatin (MYCOSTATIN) 100000 UNIT/ML suspension TAKE 5 ML BY MOUTH  4 TIMES DAILY FOR 7 DAYS 140 mL 0   ondansetron (ZOFRAN) 8 MG tablet Take 1 tablet (8 mg total) by mouth every 8 (eight) hours as needed. (Patient not taking: Reported on 06/10/2021) 30 tablet 0   prochlorperazine (COMPAZINE) 10 MG tablet Take 1 tablet (10 mg total) by mouth every 6 (six) hours as needed for nausea or vomiting. (Patient not taking: Reported on 06/10/2021) 30 tablet 0   No current facility-administered medications for this visit.    REVIEW OF SYSTEMS:   Constitutional: ( - ) fevers, ( - )  chills , ( - ) night sweats Eyes: ( - ) blurriness of vision, ( - ) double vision, ( - ) watery eyes Ears, nose, mouth, throat, and face: ( - ) mucositis, ( - ) sore  throat Respiratory: ( - ) cough, ( - ) dyspnea, ( - ) wheezes Cardiovascular: ( - ) palpitation, ( - ) chest discomfort, ( - ) lower extremity swelling Gastrointestinal:  ( - ) nausea, ( - ) heartburn, ( - ) change in bowel habits Skin: ( - ) abnormal skin rashes Lymphatics: ( - ) new lymphadenopathy, ( - ) easy bruising Neurological: ( - ) numbness, ( - ) tingling, ( - ) new weaknesses Behavioral/Psych: ( - ) mood change, ( - ) new changes  All other systems were reviewed with the patient and are negative.  PHYSICAL EXAMINATION: ECOG PERFORMANCE STATUS: 1 - Symptomatic but completely ambulatory  Vitals:   08/17/21 1459  BP: 137/70  Pulse: 79  Resp: 17  Temp: 98.3 F (36.8 C)  SpO2: 100%    Filed Weights   08/17/21 1459  Weight: 191 lb 14.4 oz (87 kg)     GENERAL: Well-appearing middle-aged [name], alert, no distress  and comfortable SKIN: skin color, texture, turgor are normal, no rashes or significant lesions THROAT: No mucositis. No overt bleeding or evidence of infection.  EYES: conjunctiva are pink and non-injected, sclera clear LUNGS: clear to auscultation and percussion with normal breathing effort HEART: regular rate & rhythm and no murmurs and no lower extremity edema ABDOMEN: soft, non-tender, non-distended, normal bowel sounds.  Ostomy clean dry and intact  Musculoskeletal: no cyanosis of digits and no clubbing  PSYCH: alert & oriented x 3, fluent speech NEURO: no focal motor/sensory deficits  LABORATORY DATA:  I have reviewed the data as listed CBC Latest Ref Rng & Units 08/17/2021 08/03/2021 07/23/2021  WBC 4.0 - 10.5 K/uL 3.8(L) 3.5(L) 4.0  Hemoglobin 13.0 - 17.0 g/dL 10.3(L) 10.0(L) 9.8(L)  Hematocrit 39.0 - 52.0 % 31.3(L) 30.0(L) 29.9(L)  Platelets 150 - 400 K/uL 189 180 153    CMP Latest Ref Rng & Units 08/17/2021 08/03/2021 07/23/2021  Glucose 70 - 99 mg/dL 88 81 153(H)  BUN 8 - 23 mg/dL 12 13 11   Creatinine 0.61 - 1.24 mg/dL 0.85 0.73 1.01   Sodium 135 - 145 mmol/L 139 139 139  Potassium 3.5 - 5.1 mmol/L 3.8 3.7 3.9  Chloride 98 - 111 mmol/L 108 108 107  CO2 22 - 32 mmol/L 26 25 27   Calcium 8.9 - 10.3 mg/dL 9.2 9.0 9.5  Total Protein 6.5 - 8.1 g/dL 7.4 7.1 6.9  Total Bilirubin 0.3 - 1.2 mg/dL 0.4 0.3 0.4  Alkaline Phos 38 - 126 U/L 74 75 73  AST 15 - 41 U/L 18 18 19   ALT 0 - 44 U/L 13 14 17     RADIOGRAPHIC STUDIES: I have personally reviewed the radiological images as listed and agreed with the findings in the report: no evidence of metastatic diease in the chest. MRI abdomen does not show metastatic disease.   No results found.  ASSESSMENT & PLAN Katai Filley 66 y.o. male with medical history significant for rectal adenocarcinoma who presents for a follow up visit.   At this time the patient's findings are most consistent with localized rectal adenocarcinoma.  MRI of the pelvis confirmed this is a T4 N2 M0 stage IIIc cancer.  As such we will plan to proceed with neoadjuvant chemotherapy, followed by chemoradiation, followed by reevaluation for consideration of transabdominal resection.  The initial treatment of choice would be neoadjuvant FOLFOX chemotherapy for 8 cycles.  Once this is complete we will transition to chemoradiation with p.o. capecitabine therapy.  When these are complete we will restage for consideration of surgical resection.  # Rectal Adenocarcinoma, T4N2M0 Stage IIIc --  staging with a CT scan of the chest and MRI abdomen/pelvis findings are consistent with localized disease.   --CEA modestly elevated at 17.44 on 02/09/2021 prior to start of therapy. --Given that this is adenocarcinoma of the rectum I would recommend proceeding with neoadjuvant chemotherapy, followed by chemoradiation, followed by definitive surgery. Case discussed at [facility] --Patient has established care with Dr. Lisbeth Renshaw and radiation oncology. --patient completed 8 cycles of neoadjuvant FOLFOX --started chemoradiation therapy with Xeloda  on 07/13/2021. Plan:  --last day of chemoradiation on 08/21/2021  --Need c-scope via colostomy before surgery as recommended by Dr. Hester Mates ([place]). GI has been contacted for f/u.  --Dosage of capecitabine to be administered with chemotherapy is 825 mg per metered squared p.o. twice daily on days radiation is received. --RTC in 3 weeks to assure he is doing well post radiation.   # Hyperglyclemia-resolved --BG elevated,  patient has no history of DM type II. Unclear why this occurred.  --BG 88 today, abnormal elevations occurred during chemotherapy treatment.  --currently on glipizide 5mg  PO daily, will DISCONTINUE this today.  --continue to monitor.   # Neutropenia, chemotherapy induced. --resolved --ANC is 2700 today --OK to proceed with Xeloda with radiation next week.   #Anemia- stable #Thrombocytopenia -improved --Hgb noted at 10.3, stable from prior --Plt 189 K today, stable from prior cycle. --likely secondary to active malignancy and modest iron deficiency. Some contribution from chemotherapy as well.  --continue to monitor  #Hypokalemia: --Today's potassium is 3.8. --continue 40 mEq of oral potassium daily  #Supportive Care -- chemotherapy education complete -- port placed -- zofran 8mg  q8H PRN and compazine 10mg  PO q6H for nausea -- EMLA cream for port -- no pain medication required at this time.   No orders of the defined types were placed in this encounter.   All questions were answered. The patient knows to call the clinic with any problems, questions or concerns.  I have spent a total of 30 minutes minutes of face-to-face and non-face-to-face time, preparing to see the patient, obtaining and/or reviewing separately obtained history, performing a medically appropriate examination, counseling and educating the patient, ordering medications,  communicating with other health care professionals, documenting clinical information in the electronic health record,  and care coordination.   Ledell Peoples, MD Department of Hematology/Oncology [place] at [facility] Phone: [phone] Pager: [phone] Email: [email or web address]   08/17/2021 4:16 PM  Hofheinz RD, Raynald Blend, Post S, Matzdorff A, Laechelt S, Edrick Oh, Mller L, Link H, Moehler M, Stacey Drain, [place], [place], [place], Hipp M, Hartung G, Gencer D, [place], [place] I, Hochhaus A. Chemoradiotherapy with capecitabine versus fluorouracil for locally advanced rectal cancer: a randomised, multicentre, non-inferiority, phase 3 trial. Lancet Oncol. 2012 Jun;13(6):579-88.

## 2021-08-18 ENCOUNTER — Telehealth: Payer: Self-pay | Admitting: Hematology and Oncology

## 2021-08-18 ENCOUNTER — Ambulatory Visit
Admission: RE | Admit: 2021-08-18 | Discharge: 2021-08-18 | Disposition: A | Discharge status: Home / Self Care | Payer: Self-pay | Source: Ambulatory Visit | Attending: Radiation Oncology | Admitting: Radiation Oncology

## 2021-08-18 ENCOUNTER — Other Ambulatory Visit: Payer: Self-pay | Admitting: Hematology and Oncology

## 2021-08-18 NOTE — Telephone Encounter (Signed)
Scheduled per 1/23 los, pt has been called and confirmed

## 2021-08-18 NOTE — Progress Notes (Signed)
° ° ° ° °                                                                                                                                                          °  Patient Name: Curtis [name] MRN: [id number] DOB: 11-25-1955 Referring Physician: Narda Rutherford Date of Service: 08/21/2021 [facility], [place]                                                        End Of Treatment Note  Diagnoses: C20-Malignant neoplasm of rectum  Cancer Staging: Stage IIIC, [id number] adenocarcinoma of the rectum  Intent: Curative  Radiation Treatment Dates: 07/13/2021 through 08/21/2021 Site Technique Total Dose (Gy) Dose per Fx (Gy) Completed Fx Beam Energies  Rectum: Rectum 3D 45/45 1.8 25/25 6X, 15X  Rectum: Rectum_Bst 3D 5.4/5.4 1.8 3/3 6X, 15X   Narrative: The patient tolerated radiation therapy relatively well. He did very well during treatment with only noted a mild increase in urinary symptoms.  Plan:The patient will receive a call in about one month from the radiation oncology department. He will continue follow up with Dr. Lorenso Courier, and Dr. Hester Mates at [facility]  as well.   ________________________________________________    Carola Rhine, [facility]

## 2021-08-19 ENCOUNTER — Other Ambulatory Visit: Payer: Self-pay

## 2021-08-19 ENCOUNTER — Encounter: Payer: Self-pay | Admitting: Hematology and Oncology

## 2021-08-19 ENCOUNTER — Ambulatory Visit
Admission: RE | Admit: 2021-08-19 | Discharge: 2021-08-19 | Disposition: A | Discharge status: Home / Self Care | Payer: Self-pay | Source: Ambulatory Visit | Attending: Radiation Oncology | Admitting: Radiation Oncology

## 2021-08-20 ENCOUNTER — Other Ambulatory Visit: Payer: Self-pay

## 2021-08-20 ENCOUNTER — Ambulatory Visit
Admission: RE | Admit: 2021-08-20 | Discharge: 2021-08-20 | Disposition: A | Discharge status: Home / Self Care | Payer: Self-pay | Source: Ambulatory Visit | Attending: Radiation Oncology | Admitting: Radiation Oncology

## 2021-08-21 ENCOUNTER — Ambulatory Visit
Admission: RE | Admit: 2021-08-21 | Discharge: 2021-08-21 | Disposition: A | Discharge status: Home / Self Care | Payer: Self-pay | Source: Ambulatory Visit | Attending: Radiation Oncology | Admitting: Radiation Oncology

## 2021-08-21 ENCOUNTER — Encounter: Payer: Self-pay | Admitting: Radiation Oncology

## 2021-08-26 ENCOUNTER — Encounter: Payer: Self-pay | Admitting: Hematology and Oncology

## 2021-08-26 ENCOUNTER — Ambulatory Visit: Payer: Self-pay | Admitting: Physician Assistant

## 2021-08-26 NOTE — Progress Notes (Signed)
°  Radiation Oncology         [phone] ________________________________  Name: Curtis [name] MRN: [id number]  Date: 08/12/2021  DOB: [birth year]  SIMULATION NOTE   NARRATIVE:  The patient underwent simulation today for ongoing radiation therapy.  The existing CT study set was employed for the purpose of virtual treatment planning.  The target and avoidance structures were reviewed and modified as necessary.  Treatment planning then occurred.  The radiation boost prescription was entered and confirmed.  A total of 7 complex treatment devices were fabricated in the form of multi-leaf collimators to shape radiation around the targets while maximally excluding nearby normal structures, which included reduced fields to improve dose homogeneity.   I have requested : Isodose Plan.    PLAN:  This modified radiation beam arrangement is intended to continue the current radiation dose to an additional 5.4 Gy in 3 fractions for a total cumulative dose of 50.4 Gy.    ------------------------------------------------  Jodelle Gross, MD, PhD

## 2021-08-27 ENCOUNTER — Encounter: Payer: Self-pay | Admitting: Gastroenterology

## 2021-08-27 ENCOUNTER — Ambulatory Visit (INDEPENDENT_AMBULATORY_CARE_PROVIDER_SITE_OTHER): Payer: Self-pay | Admitting: Gastroenterology

## 2021-08-27 VITALS — BP 140/68 | HR 88 | Ht 68.5 in | Wt 196.1 lb

## 2021-08-27 DIAGNOSIS — C20 Malignant neoplasm of rectum: Secondary | ICD-10-CM

## 2021-08-27 MED ORDER — NA SULFATE-K SULFATE-MG SULF 17.5-3.13-1.6 GM/177ML PO SOLN
1.0000 | Freq: Once | ORAL | 0 refills | Status: AC
Start: 2021-08-27 — End: 2021-08-27

## 2021-08-27 NOTE — Patient Instructions (Signed)
You have been scheduled for a colonoscopy. Please follow written instructions given to you at your visit today.  Please pick up your prep supplies at the pharmacy within the next 1-3 days. If you use inhalers (even only as needed), please bring them with you on the day of your procedure.  If you are age 64 or older, your body mass index should be between 23-30. Your Body mass index is 29.39 kg/m. If this is out of the aforementioned range listed, please consider follow up with your Primary Care Provider.  If you are age 75 or younger, your body mass index should be between 19-25. Your Body mass index is 29.39 kg/m. If this is out of the aformentioned range listed, please consider follow up with your Primary Care Provider.   ________________________________________________________  The [facility] providers would like to encourage you to use [facility] to communicate with providers for non-urgent requests or questions.  Due to long hold times on the telephone, sending your provider a message by [facility] may be a faster and more efficient way to get a response.  Please allow 48 business hours for a response.  Please remember that this is for non-urgent requests.  _______________________________________________________

## 2021-08-27 NOTE — Progress Notes (Signed)
08/27/2021 Curtis [name] [id number] [birth year]   HISTORY OF PRESENT ILLNESS: This is a 66 year old male who is a patient of Dr. Silverio Decamp.  He had a colonoscopy in June 2022 at which time he was diagnosed with rectal adenocarcinoma.  He underwent laparoscopic assisted loop colostomy as this was partially obstructing.  He has completed chemo and radiation.  Plan is for colonoscopy through his ostomy site.  He is doing well.  He says that his appetite has returned and his weight has gone back up.  He says that he actually feels pretty good.  Denies any blood through his ostomy or any issues with his ostomy.   Past Medical History:  Diagnosis Date   Rectosigmoid cancer [facility])    Past Surgical History:  Procedure Laterality Date   BIOPSY  01/21/2021   Procedure: BIOPSY;  Surgeon: Mauri Pole, MD;  Location: [place];  Service: Endoscopy;;   COLON RESECTION N/A 01/21/2021   Procedure: LAPAROSCOPIC ASSISTED LOOP COLOSTOMY;  Surgeon: Donnie Mesa, MD;  Location: [place];  Service: General;  Laterality: N/A;   FLEXIBLE SIGMOIDOSCOPY N/A 01/21/2021   Procedure: FLEXIBLE SIGMOIDOSCOPY;  Surgeon: Mauri Pole, MD;  Location: [place] [facility];  Service: Endoscopy;  Laterality: N/A;   IR IMAGING GUIDED PORT INSERTION  03/04/2021   SUBMUCOSAL TATTOO INJECTION  01/21/2021   Procedure: SUBMUCOSAL TATTOO INJECTION;  Surgeon: Mauri Pole, MD;  Location: [place] [facility];  Service: Endoscopy;;    reports that he has been smoking cigarettes. He has never used smokeless tobacco. He reports that he does not drink alcohol and does not use drugs. family history includes Breast cancer in his sister; Hypertension in his father and mother. Allergies  Allergen Reactions   Leucovorin Itching and Rash    Pt. had a reaction during infusion with Oxaliplatin. Complained of itching "all over". Red, raised rash noted to anterior chest, upper back, and abdominal area. See progress note 06/22/21  Medicated  pt. with Benadryl 50 mg IV then Solumedrol 125mg  IV. Waited 20 minutes and chemotherapy restarted   Oxaliplatin Itching and Rash    Pt. had a reaction of itching all over and red, raised rash noted to anterior upper chest, upper back, and abdominal area. See progress note 06/22/21 Medicated pt. with Benadryl 50 mg IV then Solumedrol 125mg  IV. Waited 20 minutes and chemotherapy restarted      Outpatient Encounter Medications as of 08/27/2021  Medication Sig   acetaminophen (TYLENOL) 500 MG tablet Take 1,000 mg by mouth daily.   KLOR-CON M20 20 MEQ tablet Take 1 tablet by mouth twice daily   lidocaine-prilocaine (EMLA) cream Apply 1 application topically as needed.   glipiZIDE (GLUCOTROL) 5 MG tablet Take 1 tablet (5 mg total) by mouth daily before breakfast. (Patient not taking: Reported on 08/27/2021)   ondansetron (ZOFRAN) 8 MG tablet Take 1 tablet (8 mg total) by mouth every 8 (eight) hours as needed. (Patient not taking: Reported on 08/27/2021)   prochlorperazine (COMPAZINE) 10 MG tablet Take 1 tablet (10 mg total) by mouth every 6 (six) hours as needed for nausea or vomiting. (Patient not taking: Reported on 08/27/2021)   [DISCONTINUED] nystatin (MYCOSTATIN) 100000 UNIT/ML suspension TAKE 5 ML BY MOUTH  4 TIMES DAILY FOR 7 DAYS   No facility-administered encounter medications on file as of 08/27/2021.     REVIEW OF SYSTEMS  : All other systems reviewed and negative except where noted in the History of Present Illness.   PHYSICAL EXAM: BP 140/68 (BP  Location: Left Arm, Patient Position: Sitting, Cuff Size: Normal)    Pulse 88    Ht 5' 8.5" (1.74 m) Comment: height measured without shoes   Wt 196 lb 2 oz (89 kg)    BMI 29.39 kg/m  General: Well developed male in no acute distress Head: Normocephalic and atraumatic Eyes:  Sclerae anicteric, conjunctiva pink. Ears: Normal auditory acuity Lungs: Clear throughout to auscultation; no W/R/R Heart: Regular rate and rhythm; no M/R/G. Abdomen: Soft,  non-distended.  BS present.  Non-tender.  Ostomy noted on the left abdomen. Musculoskeletal: Symmetrical with no gross deformities  Skin: No lesions on visible extremities Extremities: No edema  Neurological: Alert oriented x 4, grossly non-focal Psychological:  Alert and cooperative. Normal mood and affect  ASSESSMENT AND PLAN: *Rectal cancer: Diagnosed June 2022.  Completed radiation and chemotherapy.  Plan is for colonoscopy through his ostomy.  We will schedule Dr. Silverio Decamp.  The risks, benefits, and alternatives to colonoscopy were discussed with the patient and he consents to proceed.    CC:  No ref. provider found

## 2021-09-08 ENCOUNTER — Ambulatory Visit: Payer: Self-pay | Admitting: Gastroenterology

## 2021-09-09 ENCOUNTER — Ambulatory Visit (AMBULATORY_SURGERY_CENTER): Payer: Self-pay | Admitting: Gastroenterology

## 2021-09-09 ENCOUNTER — Encounter: Payer: Self-pay | Admitting: Gastroenterology

## 2021-09-09 VITALS — BP 110/68 | HR 76 | Temp 97.5°F | Resp 14 | Ht 68.0 in | Wt 191.0 lb

## 2021-09-09 DIAGNOSIS — C20 Malignant neoplasm of rectum: Secondary | ICD-10-CM

## 2021-09-09 DIAGNOSIS — D120 Benign neoplasm of cecum: Secondary | ICD-10-CM

## 2021-09-09 MED ORDER — SODIUM CHLORIDE 0.9 % IV SOLN: 500.0000 mL | Freq: Once | INTRAVENOUS | Status: DC

## 2021-09-09 NOTE — Progress Notes (Signed)
Called to room to assist during endoscopic procedure.  Patient ID and intended procedure confirmed with present staff. Received instructions for my participation in the procedure from the performing physician.  

## 2021-09-09 NOTE — Patient Instructions (Addendum)
Thank you for allowing [place] to care for you today! Resume previous diet and medications today. Biopsy result of polyp removed will be available in approximately  2 weeks.  Return to your normal daily activities tomorrow, 09/10/21. Repeat surveillance colonoscopy in 1 year. Follow up with Surgery and Oncology.    YOU HAD AN ENDOSCOPIC PROCEDURE TODAY AT [facility]:   Refer to the procedure report that was given to you for any specific questions about what was found during the examination.  If the procedure report does not answer your questions, please call your gastroenterologist to clarify.  If you requested that your care partner not be given the details of your procedure findings, then the procedure report has been included in a sealed envelope for you to review at your convenience later.  YOU SHOULD EXPECT: Some feelings of bloating in the abdomen. Passage of more gas than usual.  Walking can help get rid of the air that was put into your GI tract during the procedure and reduce the bloating. If you had a lower endoscopy (such as a colonoscopy or flexible sigmoidoscopy) you may notice spotting of blood in your stool or on the toilet paper. If you underwent a bowel prep for your procedure, you may not have a normal bowel movement for a few days.  Please Note:  You might notice some irritation and congestion in your nose or some drainage.  This is from the oxygen used during your procedure.  There is no need for concern and it should clear up in a day or so.  SYMPTOMS TO REPORT IMMEDIATELY:  Following lower endoscopy (colonoscopy or flexible sigmoidoscopy):  Excessive amounts of blood in the stool  Significant tenderness or worsening of abdominal pains  Swelling of the abdomen that is new, acute  Fever of 100F or higher     For urgent or emergent issues, a gastroenterologist can be reached at any hour by calling [phone]. Do not use MyChart messaging for urgent  concerns.    DIET:  We do recommend a small meal at first, but then you may proceed to your regular diet.  Drink plenty of fluids but you should avoid alcoholic beverages for 24 hours.  ACTIVITY:  You should plan to take it easy for the rest of today and you should NOT DRIVE or use heavy machinery until tomorrow (because of the sedation medicines used during the test).    FOLLOW UP: Our staff will call the number listed on your records 48-72 hours following your procedure to check on you and address any questions or concerns that you may have regarding the information given to you following your procedure. If we do not reach you, we will leave a message.  We will attempt to reach you two times.  During this call, we will ask if you have developed any symptoms of COVID 19. If you develop any symptoms (ie: fever, flu-like symptoms, shortness of breath, cough etc.) before then, please call [phone].  If you test positive for Covid 19 in the 2 weeks post procedure, please call and report this information to [place].    If any biopsies were taken you will be contacted by phone or by letter within the next 1-3 weeks.  Please call us at [phone] if you have not heard about the biopsies in 3 weeks.    SIGNATURES/CONFIDENTIALITY: You and/or your care partner have signed paperwork which will be entered into your electronic medical record.  These signatures  attest to the fact that that the information above on your After Visit Summary has been reviewed and is understood.  Full responsibility of the confidentiality of this discharge information lies with you and/or your care-partner.

## 2021-09-09 NOTE — Progress Notes (Signed)
Please refer to office visit note 08/27/21 by Alonza Bogus. No additional changes in H&P Patient is appropriate for planned procedure(s) and anesthesia in an ambulatory setting  K. Denzil Magnuson , MD [phone]

## 2021-09-09 NOTE — Progress Notes (Signed)
Pt awake, report to RN, VVS  °

## 2021-09-09 NOTE — Op Note (Signed)
[name] Procedure Date: 09/09/2021 10:52 AM MRN: [id number] Endoscopist: Mauri Pole , MD Age: 66 Referring MD:  Date of Birth: 02/06/1956 Gender: Male Account #: [id number] Procedure:                Colonoscopy Indications:              High risk colon cancer surveillance: Personal                            history of rectal cancer with obstruction s/p loop                            colostomy and neo adjuvant chemo radiation Medicines:                Monitored Anesthesia Care Procedure:                Pre-Anesthesia Assessment:                           - Prior to the procedure, a History and Physical                            was performed, and patient medications and                            allergies were reviewed. The patient's tolerance of                            previous anesthesia was also reviewed. The risks                            and benefits of the procedure and the sedation                            options and risks were discussed with the patient.                            All questions were answered, and informed consent                            was obtained. Prior Anticoagulants: The patient has                            taken no previous anticoagulant or antiplatelet                            agents. ASA Grade Assessment: II - A patient with                            mild systemic disease. After reviewing the risks                            and benefits, the patient was deemed in  satisfactory condition to undergo the procedure.                           After obtaining informed consent, the colonoscope                            was passed under direct vision. Throughout the                            procedure, the patient's blood pressure, pulse, and                            oxygen saturations were monitored continuously. The                            0405 PCF-H190TL  Slim SB Colonoscope was introduced                            through the transverse colostomy and advanced to                            the the cecum, identified by appendiceal orifice                            and ileocecal valve. The colonoscopy was performed                            without difficulty. The patient tolerated the                            procedure well. The quality of the bowel                            preparation was excellent. The ileocecal valve and                            the appendiceal orifice were photographed. Scope In: 10:58:47 AM Scope Out: 11:09:40 AM Scope Withdrawal Time: 0 hours 8 minutes 45 seconds  Total Procedure Duration: 0 hours 10 minutes 53 seconds  Findings:                 A less than 1 mm polyp was found in the appendiceal                            orifice. The polyp was sessile. The polyp was                            removed with a cold biopsy forceps. Resection and                            retrieval were complete.                           The exam was otherwise without abnormality through  colostomy. Complications:            No immediate complications. Estimated Blood Loss:     Estimated blood loss was minimal. Impression:               - One less than 1 mm polyp at the appendiceal                            orifice, removed with a cold biopsy forceps.                            Resected and retrieved.                           - The examination was otherwise normal. Recommendation:           - Patient has a contact number available for                            emergencies. The signs and symptoms of potential                            delayed complications were discussed with the                            patient. Return to normal activities tomorrow.                            Written discharge instructions were provided to the                            patient.                           -  Resume previous diet.                           - Continue present medications.                           - Await pathology results.                           - Repeat colonoscopy in 1 year for surveillance.                           - Follow up with Surgery and Oncology Mauri Pole, MD 09/09/2021 11:17:02 AM This report has been signed electronically.

## 2021-09-10 ENCOUNTER — Other Ambulatory Visit: Payer: Self-pay

## 2021-09-10 ENCOUNTER — Inpatient Hospital Stay (HOSPITAL_BASED_OUTPATIENT_CLINIC_OR_DEPARTMENT_OTHER): Payer: Self-pay | Admitting: Hematology and Oncology

## 2021-09-10 ENCOUNTER — Inpatient Hospital Stay: Payer: Self-pay

## 2021-09-10 ENCOUNTER — Inpatient Hospital Stay: Payer: Self-pay | Attending: Hematology and Oncology

## 2021-09-10 ENCOUNTER — Encounter: Payer: Self-pay | Admitting: Hematology and Oncology

## 2021-09-10 ENCOUNTER — Other Ambulatory Visit: Payer: Self-pay | Admitting: Hematology and Oncology

## 2021-09-10 VITALS — BP 137/70 | HR 67 | Temp 97.4°F | Resp 18 | Wt 195.4 lb

## 2021-09-10 DIAGNOSIS — E876 Hypokalemia: Secondary | ICD-10-CM | POA: Insufficient documentation

## 2021-09-10 DIAGNOSIS — Z95828 Presence of other vascular implants and grafts: Secondary | ICD-10-CM

## 2021-09-10 DIAGNOSIS — Z923 Personal history of irradiation: Secondary | ICD-10-CM | POA: Insufficient documentation

## 2021-09-10 DIAGNOSIS — C20 Malignant neoplasm of rectum: Secondary | ICD-10-CM | POA: Insufficient documentation

## 2021-09-10 DIAGNOSIS — R109 Unspecified abdominal pain: Secondary | ICD-10-CM | POA: Insufficient documentation

## 2021-09-10 DIAGNOSIS — D649 Anemia, unspecified: Secondary | ICD-10-CM | POA: Insufficient documentation

## 2021-09-10 DIAGNOSIS — Z8249 Family history of ischemic heart disease and other diseases of the circulatory system: Secondary | ICD-10-CM | POA: Insufficient documentation

## 2021-09-10 DIAGNOSIS — Z803 Family history of malignant neoplasm of breast: Secondary | ICD-10-CM | POA: Insufficient documentation

## 2021-09-10 DIAGNOSIS — Z79899 Other long term (current) drug therapy: Secondary | ICD-10-CM | POA: Insufficient documentation

## 2021-09-10 DIAGNOSIS — F17210 Nicotine dependence, cigarettes, uncomplicated: Secondary | ICD-10-CM | POA: Insufficient documentation

## 2021-09-10 LAB — CBC WITH DIFFERENTIAL (CANCER CENTER ONLY)
Abs Immature Granulocytes: 0.01 10*3/uL (ref 0.00–0.07)
Basophils Absolute: 0 10*3/uL (ref 0.0–0.1)
Basophils Relative: 1 %
Eosinophils Absolute: 0.2 10*3/uL (ref 0.0–0.5)
Eosinophils Relative: 5 %
HCT: 31.8 % — ABNORMAL LOW (ref 39.0–52.0)
Hemoglobin: 10.9 g/dL — ABNORMAL LOW (ref 13.0–17.0)
Immature Granulocytes: 0 %
Lymphocytes Relative: 19 %
Lymphs Abs: 0.6 10*3/uL — ABNORMAL LOW (ref 0.7–4.0)
MCH: 32.1 pg (ref 26.0–34.0)
MCHC: 34.3 g/dL (ref 30.0–36.0)
MCV: 93.5 fL (ref 80.0–100.0)
Monocytes Absolute: 0.3 10*3/uL (ref 0.1–1.0)
Monocytes Relative: 11 %
Neutro Abs: 2 10*3/uL (ref 1.7–7.7)
Neutrophils Relative %: 64 %
Platelet Count: 175 10*3/uL (ref 150–400)
RBC: 3.4 MIL/uL — ABNORMAL LOW (ref 4.22–5.81)
RDW: 15.9 % — ABNORMAL HIGH (ref 11.5–15.5)
WBC Count: 3.1 10*3/uL — ABNORMAL LOW (ref 4.0–10.5)
nRBC: 0 % (ref 0.0–0.2)

## 2021-09-10 LAB — CMP (CANCER CENTER ONLY)
ALT: 14 U/L (ref 0–44)
AST: 17 U/L (ref 15–41)
Albumin: 4.1 g/dL (ref 3.5–5.0)
Alkaline Phosphatase: 69 U/L (ref 38–126)
Anion gap: 4 — ABNORMAL LOW (ref 5–15)
BUN: 24 mg/dL — ABNORMAL HIGH (ref 8–23)
CO2: 27 mmol/L (ref 22–32)
Calcium: 9.1 mg/dL (ref 8.9–10.3)
Chloride: 106 mmol/L (ref 98–111)
Creatinine: 0.92 mg/dL (ref 0.61–1.24)
GFR, Estimated: >60 mL/min (ref >60)
Glucose, Bld: 169 mg/dL — ABNORMAL HIGH (ref 70–99)
Potassium: 3.7 mmol/L (ref 3.5–5.1)
Sodium: 137 mmol/L (ref 135–145)
Total Bilirubin: 0.4 mg/dL (ref 0.3–1.2)
Total Protein: 6.9 g/dL (ref 6.5–8.1)

## 2021-09-10 MED ORDER — HEPARIN SOD (PORK) LOCK FLUSH 100 UNIT/ML IV SOLN
500.0000 [IU] | Freq: Once | INTRAVENOUS | Status: AC
  Administered 2021-09-10: 500 [IU]

## 2021-09-10 MED ORDER — SODIUM CHLORIDE 0.9% FLUSH
10.0000 mL | Freq: Once | INTRAVENOUS | Status: AC
  Administered 2021-09-10: 10 mL

## 2021-09-10 NOTE — Progress Notes (Signed)
[facility] [phone]   Fax:([phone]  PROGRESS NOTE  Patient Care Team: Pcp, No as PCP - General Orson Slick, MD as Consulting Physician (Medical Oncology) Royston Bake, RN as Oncology Nurse Navigator (Oncology)  Hematological/Oncological History # Rectal Adenocarcinoma, T4N2M0. Stage IIIc 01/16/2021: presented to the emergency department with abdominal pain. CT abdomen/pelvis showed concern for mass lesion with perforation and surrounding adenopathy. 01/19/2021: repeat CT scan showed worsening inflammatory changes around the complex rectal process 01/21/2021: flexible sigmoidoscopy performed, lesion at 12 cm from the anal verge.  biopsy showed concern for adenocarcinoma. 01/21/2021: patient underwent a laparoscopic assisted loop colostomy with Dr. Georgette Dover. 02/09/2021: establish care with Dr. Lorenso Courier  03/05/2021: Cycle 1 Day 1 of neoadjuvant FOLFOX 03/18/2021: Cycle 2 Day 1 of neoadjuvant FOLFOX 04/02/2021: Cycle 3 Day 1 of neoadjuvant FOLFOX 04/15/2021: Cycle 4 Day 1 of neoadjuvant FOLFOX 04/30/2021: Cycle 5 Day 1 of neoadjuvant FOLFOX 05/14/2021: Cycle 6 Day 1 of neoadjuvant FOLFOX 05/27/2021: Cycle 7 Day 1 of neoadjuvant FOLFOX 06/10/2021: Cycle 8 Day 1 of neoadjuvant FOLFOX delayed due to neutropenia.  06/22/2021: Cycle 8 Day 1 of neoadjuvant FOLFOX 07/13/2021: start of Chemoradiation with Xeloda 825 mg/m2 BID 08/21/2021: Last day of chemoradiation 09/09/2021: pre-surgical colonoscopy performed, no concerning abnormalities.   Interval History:  Mccoy Demars 66 y.o. male with medical history significant for rectal adenocarcinoma who presents for a follow up visit. The patient's last visit was on 08/17/2021. In the interim since last visit he had his presurgical colonoscopy.  On exam today Mr. Schlender reports he has been well overall in the interim since her last visit.  He notes his colonoscopy went well but that he was having some difficulty with the prep  kit causing his bag to disattached.  He reports his appetite has been good and his weight has been stabilizing.  He reports that he is not having any pain though he does continue to have some tingling in his fingers.  He currently denies any nausea, vomiting, or diarrhea.  He denies any fevers, chills, night sweats, shortness of breath, chest pain, cough, neuropathy, mucositis or rash. A full 10 point ROS is listed below.  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[place])     SURGICAL HISTORY: Past Surgical History:  Procedure Laterality Date   BIOPSY  01/21/2021   Procedure: BIOPSY;  Surgeon: Mauri Pole, MD;  Location: [facility];  Service: Endoscopy;;   COLON RESECTION N/A 01/21/2021   Procedure: LAPAROSCOPIC ASSISTED LOOP COLOSTOMY;  Surgeon: Donnie Mesa, MD;  Location: [facility];  Service: General;  Laterality: N/A;   FLEXIBLE SIGMOIDOSCOPY N/A 01/21/2021   Procedure: FLEXIBLE SIGMOIDOSCOPY;  Surgeon: Mauri Pole, MD;  Location: [facility];  Service: Endoscopy;  Laterality: N/A;   IR IMAGING GUIDED PORT INSERTION  03/04/2021   SUBMUCOSAL TATTOO INJECTION  01/21/2021   Procedure: SUBMUCOSAL TATTOO INJECTION;  Surgeon: Mauri Pole, MD;  Location: [facility];  Service: Endoscopy;;    SOCIAL HISTORY: Social History   Socioeconomic History   Marital status: Divorced    Spouse name: Not on file   Number of children: Not on file   Years of education: Not on file   Highest education level: Not on file  Occupational History   Not on file  Tobacco Use   Smoking status: Every Day    Types: Cigarettes   Smokeless tobacco: Never  Vaping Use   Vaping Use: Never used  Substance  and Sexual Activity   Alcohol use: Never   Drug use: Never   Sexual activity: Never  Other Topics Concern   Not on file  Social History Narrative   Not on file   Social Determinants of Health   Financial Resource Strain: Low Risk    Difficulty  of Paying Living Expenses: Not very hard  Food Insecurity: No Food Insecurity   Worried About Running Out of Food in the Last Year: Never true   Ran Out of Food in the Last Year: Never true  Transportation Needs: No Transportation Needs   Lack of Transportation (Medical): No   Lack of Transportation (Non-Medical): No  Physical Activity: Not on file  Stress: Not on file  Social Connections: Not on file  Intimate Partner Violence: Not At Risk   Fear of Current or Ex-Partner: No   Emotionally Abused: No   Physically Abused: No   Sexually Abused: No    FAMILY HISTORY: Family History  Problem Relation Age of Onset   Hypertension Mother    Hypertension Father    Breast cancer Sister     ALLERGIES:  is allergic to leucovorin and oxaliplatin.  MEDICATIONS:  Current Outpatient Medications  Medication Sig Dispense Refill   acetaminophen (TYLENOL) 500 MG tablet Take 1,000 mg by mouth daily.     KLOR-CON M20 20 MEQ tablet Take 1 tablet by mouth twice daily 30 tablet 0   lidocaine-prilocaine (EMLA) cream Apply 1 application topically as needed. 30 g 0   ondansetron (ZOFRAN) 8 MG tablet Take 1 tablet (8 mg total) by mouth every 8 (eight) hours as needed. (Patient not taking: Reported on 08/27/2021) 30 tablet 0   prochlorperazine (COMPAZINE) 10 MG tablet Take 1 tablet (10 mg total) by mouth every 6 (six) hours as needed for nausea or vomiting. (Patient not taking: Reported on 08/27/2021) 30 tablet 0   No current facility-administered medications for this visit.    REVIEW OF SYSTEMS:   Constitutional: ( - ) fevers, ( - )  chills , ( - ) night sweats Eyes: ( - ) blurriness of vision, ( - ) double vision, ( - ) watery eyes Ears, nose, mouth, throat, and face: ( - ) mucositis, ( - ) sore throat Respiratory: ( - ) cough, ( - ) dyspnea, ( - ) wheezes Cardiovascular: ( - ) palpitation, ( - ) chest discomfort, ( - ) lower extremity swelling Gastrointestinal:  ( - ) nausea, ( - ) heartburn, ( - )  change in bowel habits Skin: ( - ) abnormal skin rashes Lymphatics: ( - ) new lymphadenopathy, ( - ) easy bruising Neurological: ( - ) numbness, ( - ) tingling, ( - ) new weaknesses Behavioral/Psych: ( - ) mood change, ( - ) new changes  All other systems were reviewed with the patient and are negative.  PHYSICAL EXAMINATION: ECOG PERFORMANCE STATUS: 1 - Symptomatic but completely ambulatory  Vitals:   09/10/21 1202  BP: 137/70  Pulse: 67  Resp: 18  Temp: (!) 97.4 F (36.3 C)  SpO2: 99%    Filed Weights   09/10/21 1202  Weight: 195 lb 7 oz (88.6 kg)     GENERAL: Well-appearing middle-aged [name], alert, no distress and comfortable SKIN: skin color, texture, turgor are normal, no rashes or significant lesions THROAT: No mucositis. No overt bleeding or evidence of infection.  EYES: conjunctiva are pink and non-injected, sclera clear LUNGS: clear to auscultation and percussion with normal breathing effort HEART: regular rate &  rhythm and no murmurs and no lower extremity edema ABDOMEN: soft, non-tender, non-distended, normal bowel sounds.  Ostomy clean dry and intact  Musculoskeletal: no cyanosis of digits and no clubbing  PSYCH: alert & oriented x 3, fluent speech NEURO: no focal motor/sensory deficits  LABORATORY DATA:  I have reviewed the data as listed CBC Latest Ref Rng & Units 09/10/2021 08/17/2021 08/03/2021  WBC 4.0 - 10.5 K/uL 3.1(L) 3.8(L) 3.5(L)  Hemoglobin 13.0 - 17.0 g/dL 10.9(L) 10.3(L) 10.0(L)  Hematocrit 39.0 - 52.0 % 31.8(L) 31.3(L) 30.0(L)  Platelets 150 - 400 K/uL 175 189 180    CMP Latest Ref Rng & Units 09/10/2021 08/17/2021 08/03/2021  Glucose 70 - 99 mg/dL 169(H) 88 81  BUN 8 - 23 mg/dL 24(H) 12 13  Creatinine 0.61 - 1.24 mg/dL 0.92 0.85 0.73  Sodium 135 - 145 mmol/L 137 139 139  Potassium 3.5 - 5.1 mmol/L 3.7 3.8 3.7  Chloride 98 - 111 mmol/L 106 108 108  CO2 22 - 32 mmol/L $RemoveB'27 26 25  'ZTZPWMnv$ Calcium 8.9 - 10.3 mg/dL 9.1 9.2 9.0  Total Protein 6.5 -  8.1 g/dL 6.9 7.4 7.1  Total Bilirubin 0.3 - 1.2 mg/dL 0.4 0.4 0.3  Alkaline Phos 38 - 126 U/L 69 74 75  AST 15 - 41 U/L $Remo'17 18 18  'kqpRP$ ALT 0 - 44 U/L $Remo'14 13 14    'xDvyc$ RADIOGRAPHIC STUDIES: I have personally reviewed the radiological images as listed and agreed with the findings in the report: no evidence of metastatic diease in the chest. MRI abdomen does not show metastatic disease.   No results found.  ASSESSMENT & PLAN Lorren Steuart 66 y.o. male with medical history significant for rectal adenocarcinoma who presents for a follow up visit.   At this time the patient's findings are most consistent with localized rectal adenocarcinoma.  MRI of the pelvis confirmed this is a T4 N2 M0 stage IIIc cancer.  As such we will plan to proceed with neoadjuvant chemotherapy, followed by chemoradiation, followed by reevaluation for consideration of transabdominal resection.  The initial treatment of choice would be neoadjuvant FOLFOX chemotherapy for 8 cycles.  Once this is complete we will transition to chemoradiation with p.o. capecitabine therapy.  When these are complete we will restage for consideration of surgical resection.  # Rectal Adenocarcinoma, T4N2M0 Stage IIIc --  staging with a CT scan of the chest and MRI abdomen/pelvis findings are consistent with localized disease.   --CEA modestly elevated at 17.44 on 02/09/2021 prior to start of therapy. --Given that this is adenocarcinoma of the rectum I would recommend proceeding with neoadjuvant chemotherapy, followed by chemoradiation, followed by definitive surgery. Case discussed at [facility] --patient completed 8 cycles of neoadjuvant FOLFOX --started chemoradiation therapy with Xeloda on 07/13/2021. Last treatment on 08/21/2021.  Plan:  --last day of chemoradiation on 08/21/2021  --colonoscopy performed yesterday, no concerning findings.  -- MRI and visit next week with Dr. Hester Mates ([place]).  --RTC 4 weeks post surgery   #  Hyperglyclemia-resolved --BG elevated, patient has no history of DM type II. Unclear why this occurred.  --BG 169 today, abnormal elevations occurred during chemotherapy treatment.  --off glipizide  --continue to monitor.   #Anemia- stable #Thrombocytopenia -improved --Hgb noted at 10.9, stable from prior --Plt 175 K today, stable from prior cycle. --likely secondary to active malignancy and modest iron deficiency. Some contribution from chemotherapy as well.  --continue to monitor  #Hypokalemia: --Today's potassium is 3.7. --OK to d/c oral potassium daily  #  Supportive Care -- chemotherapy education complete -- port placed -- zofran 8mg  q8H PRN and compazine 10mg  PO q6H for nausea -- EMLA cream for port -- no pain medication required at this time.   No orders of the defined types were placed in this encounter.   All questions were answered. The patient knows to call the clinic with any problems, questions or concerns.  I have spent a total of 30 minutes minutes of face-to-face and non-face-to-face time, preparing to see the patient, obtaining and/or reviewing separately obtained history, performing a medically appropriate examination, counseling and educating the patient, ordering medications,  communicating with other health care professionals, documenting clinical information in the electronic health record, and care coordination.   Ledell Peoples, MD Department of Hematology/Oncology [place] at [facility] Phone: [phone] Pager: [phone] Email: [email or web address]   09/10/2021 1:17 PM  Hofheinz RD, Raynald Blend, Post S, Matzdorff A, Laechelt S, Edrick Oh, Mller L, Link H, Moehler M, Stacey Drain, [place], [place], [place], Hipp M, Hartung G, Gencer D, [place], [place] I, Hochhaus A. Chemoradiotherapy with capecitabine versus fluorouracil for locally advanced rectal cancer: a randomised,  multicentre, non-inferiority, phase 3 trial. Lancet Oncol. 2012 Jun;13(6):579-88.

## 2021-09-11 ENCOUNTER — Telehealth: Payer: Self-pay | Admitting: Hematology and Oncology

## 2021-09-11 ENCOUNTER — Telehealth: Payer: Self-pay

## 2021-09-11 ENCOUNTER — Telehealth: Payer: Self-pay | Admitting: *Deleted

## 2021-09-11 ENCOUNTER — Encounter: Payer: Self-pay | Admitting: Hematology and Oncology

## 2021-09-11 NOTE — Telephone Encounter (Signed)
Scheduled follow-up appointment per 2/16 los. Patient is aware.

## 2021-09-11 NOTE — Telephone Encounter (Signed)
Attempted to reach pt. With follow-up call following endoscopic procedure 09/09/2021.  LM On pt. Voice mail to call if he has any questions or concerns.

## 2021-09-11 NOTE — Telephone Encounter (Signed)
Attempted f/u phone call. No answer. Left message. °

## 2021-09-15 ENCOUNTER — Encounter: Payer: Self-pay | Admitting: Gastroenterology

## 2021-09-16 NOTE — Progress Notes (Signed)
Reviewed and agree with documentation and assessment and plan. K. Veena Izaiha Lo , MD   

## 2021-09-21 ENCOUNTER — Ambulatory Visit
Admission: RE | Admit: 2021-09-21 | Discharge: 2021-09-21 | Disposition: A | Discharge status: Home / Self Care | Payer: Self-pay | Source: Ambulatory Visit | Attending: Radiation Oncology | Admitting: Radiation Oncology

## 2021-09-21 DIAGNOSIS — C20 Malignant neoplasm of rectum: Secondary | ICD-10-CM

## 2021-09-21 NOTE — Progress Notes (Signed)
°  Radiation Oncology         [phone] ________________________________  Name: Curtis [name] MRN: [id number]  Date of Service: 09/21/2021  DOB: 08/02/1955  Post Treatment Telephone Note  Diagnosis:   Stage IIIC, YY4M2N0 adenocarcinoma of the rectum  Intent: Curative  Radiation Treatment Dates: 07/13/2021 through 08/21/2021 Site Technique Total Dose (Gy) Dose per Fx (Gy) Completed Fx Beam Energies  Rectum: Rectum 3D 45/45 1.8 25/25 6X, 15X  Rectum: Rectum_Bst 3D 5.4/5.4 1.8 3/3 6X, 15X   Narrative: The patient tolerated radiation therapy relatively well. He did very well during treatment with only noted a mild increase in urinary symptoms. He had a repeat MRI pelvis at [facility] on 09/14/21 which showed improvement in his primary tumor. He is scheduled for surgery in April at [facility]. His urinary symptoms have improved.  Impression/Plan: 1. Stage IIIC, [id number] adenocarcinoma of the rectum. The patient has been doing well since completion of radiotherapy. We discussed that we would be happy to continue to follow him as needed, but he will also continue to follow up with Dr. Lorenso Courier in medical oncology and Dr. Hester Mates at [facility].      Carola Rhine, PAC

## 2021-10-21 ENCOUNTER — Other Ambulatory Visit (HOSPITAL_COMMUNITY): Payer: Self-pay

## 2021-10-29 ENCOUNTER — Telehealth: Payer: Self-pay | Admitting: *Deleted

## 2021-10-29 ENCOUNTER — Other Ambulatory Visit: Payer: Self-pay | Admitting: Hematology and Oncology

## 2021-10-29 ENCOUNTER — Inpatient Hospital Stay: Payer: Self-pay

## 2021-10-29 ENCOUNTER — Inpatient Hospital Stay: Payer: Self-pay | Admitting: Hematology and Oncology

## 2021-10-29 DIAGNOSIS — C20 Malignant neoplasm of rectum: Secondary | ICD-10-CM

## 2021-10-29 NOTE — Telephone Encounter (Signed)
TCT patient regarding his follow up appts. Spoke with him. He is aware that we will see him 4 weeks after his surgery which is scheduled for 11/04/21. His next appt with [place] is on 12/02/21. ? ?Pt did say his fingers are numb and is can be difficult to button his buttons on his shirt and tie his shoe laces. Advised that this is likely due to the chemo he received. Advised that I would let Dr. Lorenso Courier know of this. ?

## 2021-11-04 HISTORY — PX: COLON RESECTION SIGMOID: SHX6737

## 2021-11-20 ENCOUNTER — Ambulatory Visit (HOSPITAL_COMMUNITY)
Admission: RE | Admit: 2021-11-20 | Discharge: 2021-11-20 | Disposition: A | Discharge status: Home / Self Care | Payer: Self-pay | Source: Ambulatory Visit | Attending: Nurse Practitioner | Admitting: Nurse Practitioner

## 2021-11-20 DIAGNOSIS — K9419 Other complications of enterostomy: Secondary | ICD-10-CM | POA: Insufficient documentation

## 2021-11-20 DIAGNOSIS — L24B1 Irritant contact dermatitis related to digestive stoma or fistula: Secondary | ICD-10-CM

## 2021-11-20 DIAGNOSIS — L24B3 Irritant contact dermatitis related to fecal or urinary stoma or fistula: Secondary | ICD-10-CM | POA: Insufficient documentation

## 2021-11-20 DIAGNOSIS — Z432 Encounter for attention to ileostomy: Secondary | ICD-10-CM

## 2021-11-20 DIAGNOSIS — C20 Malignant neoplasm of rectum: Secondary | ICD-10-CM | POA: Insufficient documentation

## 2021-11-20 NOTE — Progress Notes (Signed)
[name]  ? ?Reason for visit:  ?LUQ loop ileostomy with red rubber bridge in place.  Has been leaking continuously.  Has been changing pouch multiple times daily.  Is now limiting fluid intake to avoid leakage.  Is frustrated and despondent regarding his situation. 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   ?ROS  ?Review of Systems  ?Gastrointestinal:  Positive for constipation.  ?     Irritant contact dermatitis from leaking pouch ?Red rubber retention in place (Removed)  ?LUQ ileostomy  ?Skin:  Positive for rash.  ?     Breakdown around stoma  ?All other systems reviewed and are negative. ?Vital signs:  ?BP (!) 141/59 (BP Location: Right Arm)   Pulse 76   Temp 98.3 ?F (36.8 ?C) (Oral)   Resp 18   SpO2 97%  ?Exam:  ?Physical Exam ?Vitals reviewed.  ?Constitutional:   ?   Appearance: Normal appearance.  ?Abdominal:  ?   Palpations: Abdomen is soft.  ?   Comments: LUQ ileostomy (used same opening at colostomy)  ?Skin: ?   General: Skin is warm and dry.  ?   Findings: Lesion present.  ?Neurological:  ?   Mental Status: He is alert and oriented to person, place, and time.  ?Psychiatric:     ?   Mood and Affect: Mood normal.  ?  ?Stoma type/location:  LUQ ileostomy  surgeon used same opening as colostomy ?Has red rubber catheter retention in place today.  Stoma is budded, pink moist. Os points down at 6 o'clock.   Patent is producing liquid green stool. Patient has not been drinking to minimize output.  We discuss dehydration risk.  He is not experiencing dry mouth, dizziness or concentrated urine.  His stomal output has lessened.  He agrees to drink if he can get his pouch to stay sealed. He was successful in managing his colostomy.   ?Stomal assessment/size:  1" budded pink and moist creasing at 3 and 9 o'clock with denuded skin present circumferentially, extends 9 cm with full thickness tissue loss noted at distal end. Photo in chart.   Midline staple line intact, but effluent has begun to irritate the staple line as well.  Erythematous and tender to touch  staple line well approximated  ?Peristomal assessment:  See above. Irritant contact dermatitis from leaking pouch ?Treatment options for stomal/peristomal skin: 2 weeks out from surgery.  Red rubber catheter is removed so pouching can be improved.  No separation or recession is noted.  We treat skin breakdown with stomal powder and skin prep x 3.  Barrier ring around stoma  ?Output: minimal liquid green stool ?Ostomy pouching: 1pc.convex barrier ring  stoma powder and skin prep to denuded skin.  Barrier ring to peristomal skin ?Ostomy belt applied to secure pouch due to creasing.  ? ? ?Education provided:  Patient is very frustrated and states he is limiting intake due to output and leaking.  He wants the catheter removed to improve his pouching situation. We discuss the risk and seriousness of becoming dehydrated and he will drink sufficient quantity when he feels his pouch is secure.  ? He is paying for his own pouches and rings and has gone through a months quantity in 4 or 5 days.   ?[name], Provider for [facility] has agreed that red rubber retention can be removed.  ?I demonstrate pouching with powder skin prep and ring.  We measure stoma with  out bridge (1") and I send him home with pattern.  I provide him with 3 pouch sets, powder and skin prep and a belt.  He will return in one week and agrees to hydrate.  ? ?  ?Impression/dx  ?Irritant contact dermatitis due to leaking pouch ?Ileostomy  pouching complications ?Discussion  ?Removed red rubber bridge ?Given 1 piece convex pouch, barrier ring, stoma powder and skin prep with ostomy belt ?Plan  ?See back in one week.  ? ? ? ?Visit time: 55 minutes.  ? ?[name] FNP-BC ? ?  ?

## 2021-11-20 NOTE — Discharge Instructions (Signed)
Today we removed the red rubber bridge ?WE switched to a 1 piece convex pouch ?Stoma powder and skin prep to skin ?Barrier ring around stoma ?Wear ostomy belt. ?Empty when 1/3 full ?Drink plenty of fluids ? ?

## 2021-11-27 ENCOUNTER — Ambulatory Visit (HOSPITAL_COMMUNITY)
Admission: RE | Admit: 2021-11-27 | Discharge: 2021-11-27 | Disposition: A | Discharge status: Home / Self Care | Payer: Self-pay | Source: Ambulatory Visit | Attending: Nurse Practitioner | Admitting: Nurse Practitioner

## 2021-11-27 DIAGNOSIS — L24B1 Irritant contact dermatitis related to digestive stoma or fistula: Secondary | ICD-10-CM

## 2021-11-27 DIAGNOSIS — L245 Irritant contact dermatitis due to other chemical products: Secondary | ICD-10-CM | POA: Insufficient documentation

## 2021-11-27 DIAGNOSIS — Y839 Surgical procedure, unspecified as the cause of abnormal reaction of the patient, or of later complication, without mention of misadventure at the time of the procedure: Secondary | ICD-10-CM | POA: Insufficient documentation

## 2021-11-27 DIAGNOSIS — K9419 Other complications of enterostomy: Secondary | ICD-10-CM | POA: Insufficient documentation

## 2021-11-27 DIAGNOSIS — Z432 Encounter for attention to ileostomy: Secondary | ICD-10-CM

## 2021-11-27 NOTE — Discharge Instructions (Signed)
Continue 1 piece pouch ?Skin prep and powder.  Stop powder if ring wont stick.  ?Barrier ring only at 3 and 9 o'clock and on the bottom half ?Wear belt ?We added barrier strips to the edge today ?Warm pouch after applying ?Change every 4-5 days.  ?

## 2021-11-27 NOTE — Progress Notes (Signed)
[facility]  ? ?Reason for visit:  ?LUQ ileostomy  Last visit, red rubber bridge removed.  Pouch stayed in place for 6 days.  Since that pouch was removed, it has leaked 7-8 times in a day, requiring pouch changes.  Patient reports skin was nearly healed and today it is irritated and nonintact again. He feels the barrier ring kept popping off  ?HPI:  ?Colostomy revision 11/04/21 with ileostomy due to stomal stenosis ?ROS  ?Review of Systems  ?Constitutional:  Positive for fatigue.  ?     Anxious and not sleeping due to pouch leaking x 24 hours.   ?Gastrointestinal:   ?     High output from ileostomy ?Deep creasing at 3 and 9 o'clock ?Midline abdominal staple line  ?Skin:  Positive for rash and wound.  ?     Contact dermatitis ?Midline abdominal staple line (surgical)  ?Psychiatric/Behavioral:  The patient is nervous/anxious.   ?All other systems reviewed and are negative. ?Vital signs:  ?BP 124/73 (BP Location: Right Arm)   Pulse 73   Temp 97.7 ?F (36.5 ?C) (Oral)   Resp 18   SpO2 99%  ?Exam:  ?Physical Exam ?Vitals reviewed.  ?HENT:  ?   Mouth/Throat:  ?   Mouth: Mucous membranes are moist.  ?Abdominal:  ?   Palpations: Abdomen is soft.  ?   Comments: Creasing at 3 and 9 o'clock around stoma  ?Skin: ?   General: Skin is warm.  ? ?    ?   Comments: LUQ ileostomy with peristomal breakdown ?Midline abdominal surgical wound-intact staple line  ?Neurological:  ?   General: No focal deficit present.  ?   Mental Status: He is alert and oriented to person, place, and time.  ?Psychiatric:     ?   Mood and Affect: Mood normal.  ?  ?Stoma type/location:  LUQ ileostomy (used previous colostomy site) ?Stomal assessment/size:  1" budded  os points downward to 6 o'clock.   ?Peristomal assessment:  deep creasing at 3 and 9 o'clock, necessitating convexity and belt.  ?Midline abdominal staple line ?Treatment options for stomal/peristomal skin: Today, skin prep and stoma powder to moist, irritated skin.  Informed if  pouch leaks, to skip powder and just use skin prep.  We broke barrier ring into a U shape for the bottom half and 2 small strips to fill in the defects at 3 and 9 o'clock.  We flattened these strips.  Applied pouch and secured barrier with barrier strips.  WE applied ostomy belt and warmed pouch to skin. ?Output: continuous liquid effluent.  ?Ostomy pouching: 1pc.convex  stoma powder and skin prep  barrier ring and belt.  ?Education provided:  demonstrated only using portion of ring and adding barrier strips.  ? ?  ?Impression/dx  ?Irritant contact dermatitis ?Ileostomy in creasing ?Discussion  ?Changes to pouching ?Change pouch every 3 days, even if not leaking until skin heals again ?See in one week  ?Plan  ?See in one week ?Enroll with [facility] ? ? ? ?Visit time: 55 minutes.  ? ?[name] FNP-BC ? ?  ?

## 2021-12-02 ENCOUNTER — Inpatient Hospital Stay: Payer: Self-pay | Attending: Hematology and Oncology | Admitting: Hematology and Oncology

## 2021-12-02 ENCOUNTER — Other Ambulatory Visit: Payer: Self-pay

## 2021-12-02 ENCOUNTER — Inpatient Hospital Stay: Payer: Self-pay

## 2021-12-02 VITALS — BP 135/81 | HR 71 | Temp 97.9°F | Resp 16 | Wt 186.4 lb

## 2021-12-02 DIAGNOSIS — R109 Unspecified abdominal pain: Secondary | ICD-10-CM | POA: Insufficient documentation

## 2021-12-02 DIAGNOSIS — E876 Hypokalemia: Secondary | ICD-10-CM | POA: Insufficient documentation

## 2021-12-02 DIAGNOSIS — Z95828 Presence of other vascular implants and grafts: Secondary | ICD-10-CM

## 2021-12-02 DIAGNOSIS — F17210 Nicotine dependence, cigarettes, uncomplicated: Secondary | ICD-10-CM | POA: Insufficient documentation

## 2021-12-02 DIAGNOSIS — D649 Anemia, unspecified: Secondary | ICD-10-CM | POA: Insufficient documentation

## 2021-12-02 DIAGNOSIS — Z8249 Family history of ischemic heart disease and other diseases of the circulatory system: Secondary | ICD-10-CM | POA: Insufficient documentation

## 2021-12-02 DIAGNOSIS — N179 Acute kidney failure, unspecified: Secondary | ICD-10-CM | POA: Insufficient documentation

## 2021-12-02 DIAGNOSIS — Z803 Family history of malignant neoplasm of breast: Secondary | ICD-10-CM | POA: Insufficient documentation

## 2021-12-02 DIAGNOSIS — C20 Malignant neoplasm of rectum: Secondary | ICD-10-CM

## 2021-12-02 DIAGNOSIS — Z79899 Other long term (current) drug therapy: Secondary | ICD-10-CM | POA: Insufficient documentation

## 2021-12-02 LAB — CMP (CANCER CENTER ONLY)
ALT: 16 U/L (ref 0–44)
AST: 17 U/L (ref 15–41)
Albumin: 4.1 g/dL (ref 3.5–5.0)
Alkaline Phosphatase: 108 U/L (ref 38–126)
Anion gap: 6 (ref 5–15)
BUN: 48 mg/dL — ABNORMAL HIGH (ref 8–23)
CO2: 20 mmol/L — ABNORMAL LOW (ref 22–32)
Calcium: 9.5 mg/dL (ref 8.9–10.3)
Chloride: 111 mmol/L (ref 98–111)
Creatinine: 1.81 mg/dL — ABNORMAL HIGH (ref 0.61–1.24)
GFR, Estimated: 41 mL/min — ABNORMAL LOW (ref >60)
Glucose, Bld: 124 mg/dL — ABNORMAL HIGH (ref 70–99)
Potassium: 4.5 mmol/L (ref 3.5–5.1)
Sodium: 137 mmol/L (ref 135–145)
Total Bilirubin: 0.3 mg/dL (ref 0.3–1.2)
Total Protein: 7.8 g/dL (ref 6.5–8.1)

## 2021-12-02 LAB — CBC WITH DIFFERENTIAL (CANCER CENTER ONLY)
Abs Immature Granulocytes: 0.01 10*3/uL (ref 0.00–0.07)
Basophils Absolute: 0 10*3/uL (ref 0.0–0.1)
Basophils Relative: 1 %
Eosinophils Absolute: 0.3 10*3/uL (ref 0.0–0.5)
Eosinophils Relative: 7 %
HCT: 32.8 % — ABNORMAL LOW (ref 39.0–52.0)
Hemoglobin: 10.8 g/dL — ABNORMAL LOW (ref 13.0–17.0)
Immature Granulocytes: 0 %
Lymphocytes Relative: 17 %
Lymphs Abs: 0.8 10*3/uL (ref 0.7–4.0)
MCH: 29.3 pg (ref 26.0–34.0)
MCHC: 32.9 g/dL (ref 30.0–36.0)
MCV: 89.1 fL (ref 80.0–100.0)
Monocytes Absolute: 0.4 10*3/uL (ref 0.1–1.0)
Monocytes Relative: 8 %
Neutro Abs: 3.2 10*3/uL (ref 1.7–7.7)
Neutrophils Relative %: 67 %
Platelet Count: 201 10*3/uL (ref 150–400)
RBC: 3.68 MIL/uL — ABNORMAL LOW (ref 4.22–5.81)
RDW: 13 % (ref 11.5–15.5)
WBC Count: 4.7 10*3/uL (ref 4.0–10.5)
nRBC: 0 % (ref 0.0–0.2)

## 2021-12-02 MED ORDER — HEPARIN SOD (PORK) LOCK FLUSH 100 UNIT/ML IV SOLN
500.0000 [IU] | Freq: Once | INTRAVENOUS | Status: AC
  Administered 2021-12-02: 500 [IU]

## 2021-12-02 MED ORDER — SODIUM CHLORIDE 0.9% FLUSH
10.0000 mL | Freq: Once | INTRAVENOUS | Status: AC
  Administered 2021-12-02: 10 mL

## 2021-12-02 NOTE — Progress Notes (Signed)
?[place] ?[phone]   [phone] ? ?PROGRESS NOTE ? ?Patient Care Team: ?Pcp, No as PCP - General ?Orson Slick, MD as Consulting Physician (Medical Oncology) ?Earl Gala, Deliah Goody, RN as Sales executive (Oncology) ? ?Hematological/Oncological History ?# Rectal Adenocarcinoma, [id number]. Stage IIIc ?01/16/2021: presented to the emergency department with abdominal pain. CT abdomen/pelvis showed concern for mass lesion with perforation and surrounding adenopathy. ?01/19/2021: repeat CT scan showed worsening inflammatory changes around the complex rectal process ?01/21/2021: flexible sigmoidoscopy performed, lesion at 12 cm from the anal verge.  biopsy showed concern for adenocarcinoma. ?01/21/2021: patient underwent a laparoscopic assisted loop colostomy with Dr. Georgette Dover. ?02/09/2021: establish care with Dr. Lorenso Courier  ?03/05/2021: Cycle 1 Day 1 of neoadjuvant FOLFOX ?03/18/2021: Cycle 2 Day 1 of neoadjuvant FOLFOX ?04/02/2021: Cycle 3 Day 1 of neoadjuvant FOLFOX ?04/15/2021: Cycle 4 Day 1 of neoadjuvant FOLFOX ?04/30/2021: Cycle 5 Day 1 of neoadjuvant FOLFOX ?05/14/2021: Cycle 6 Day 1 of neoadjuvant FOLFOX ?05/27/2021: Cycle 7 Day 1 of neoadjuvant FOLFOX ?06/10/2021: Cycle 8 Day 1 of neoadjuvant FOLFOX delayed due to neutropenia.  ?06/22/2021: Cycle 8 Day 1 of neoadjuvant FOLFOX ?07/13/2021: start of Chemoradiation with Xeloda 825 mg/m2 BID ?08/21/2021: Last day of chemoradiation ?09/09/2021: pre-surgical colonoscopy performed, no concerning abnormalities.  ?11/04/2021: LAR[facility]DLI at [facility] with Dr. Hester Mates ? ?Interval History:  ?Curtis [name] 66 y.o. male with medical history significant for rectal adenocarcinoma who presents for a follow up visit. The patient's last visit was on 09/10/2021. In the interim since last visit he underwent his LAR/DLI on 11/04/2021 at [facility] with Dr. Hester Mates ? ?On exam today Curtis [name] reports overall the surgical procedure went well.  He is delighted  to hear that you are able to remove all of the cancer with negative margins.  He reports he has another visit coming up on June 20 at which time they will consider a reversal of his ostomy.  He reports overall he feels good he is concerned he may be dehydrated.  He was limiting his fluid intake as he is having watery output through his ostomy.  He was told to start taking Imodium and he began doing this yesterday.  He notes his appetite has been so-so.  He also notes that because he has to empty the bag frequently he has not been sleeping well.  He currently denies any nausea, vomiting, or diarrhea.  He denies any fevers, chills, night sweats, shortness of breath, chest pain, cough, neuropathy, mucositis or rash. A full 10 point ROS is listed below. ? ?MEDICAL HISTORY:  ?Past Medical History:  ?Diagnosis Date  ? 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 ? ?SURGICAL HISTORY: ?Past Surgical History:  ?Procedure Laterality Date  ? BIOPSY  01/21/2021  ? Procedure: BIOPSY;  Surgeon: Mauri Pole, MD;  Location: [place];  Service: Endoscopy;;  ? COLON RESECTION N/A 01/21/2021  ? Procedure: LAPAROSCOPIC ASSISTED LOOP COLOSTOMY;  Surgeon: Donnie Mesa, MD;  Location: [place];  Service: General;  Laterality: N/A;  ? FLEXIBLE SIGMOIDOSCOPY N/A 01/21/2021  ? Procedure: FLEXIBLE SIGMOIDOSCOPY;  Surgeon: Mauri Pole, MD;  Location: [facility];  Service: Endoscopy;  Laterality: N/A;  ? IR IMAGING GUIDED PORT INSERTION  03/04/2021  ? SUBMUCOSAL TATTOO INJECTION  01/21/2021  ? Procedure: SUBMUCOSAL TATTOO INJECTION;  Surgeon: Mauri Pole, MD;  Location: [place];  Service: Endoscopy;;  ? ? ?SOCIAL HISTORY: ?Social History  ? ?Socioeconomic History  ? Marital status: Divorced  ?  Spouse name: Not  on file  ? Number of children: Not on file  ? Years of education: Not on file  ? Highest education level: Not on file  ?Occupational History  ? Not on file  ?Tobacco Use  ? Smoking status: Every Day  ?  Types: Cigarettes  ?  Smokeless tobacco: Never  ?Vaping Use  ? Vaping Use: Never used  ?Substance and Sexual Activity  ? Alcohol use: Never  ? Drug use: Never  ? Sexual activity: Never  ?Other Topics Concern  ? Not on file  ?Social History Narrative  ? Not on file  ? ?Social Determinants of Health  ? ?Financial Resource Strain: Low Risk   ? Difficulty of Paying Living Expenses: Not very hard  ?Food Insecurity: No Food Insecurity  ? Worried About Charity fundraiser in the Last Year: Never true  ? Ran Out of Food in the Last Year: Never true  ?Transportation Needs: No Transportation Needs  ? Lack of Transportation (Medical): No  ? Lack of Transportation (Non-Medical): No  ?Physical Activity: Not on file  ?Stress: Not on file  ?Social Connections: Not on file  ?Intimate Partner Violence: Not At Risk  ? Fear of Current or Ex-Partner: No  ? Emotionally Abused: No  ? Physically Abused: No  ? Sexually Abused: No  ? ? ?FAMILY HISTORY: ?Family History  ?Problem Relation Age of Onset  ? Hypertension Mother   ? Hypertension Father   ? Breast cancer Sister   ? ? ?ALLERGIES:  is allergic to leucovorin and oxaliplatin. ? ?MEDICATIONS:  ?Current Outpatient Medications  ?Medication Sig Dispense Refill  ? celecoxib (CELEBREX) 200 MG capsule Take by mouth.    ? loperamide (IMODIUM) 2 MG capsule Take by mouth as needed for diarrhea or loose stools. Up to 6 /day    ? acetaminophen (TYLENOL) 500 MG tablet Take 1,000 mg by mouth daily.    ? enoxaparin (LOVENOX) 40 MG/0.4ML injection Inject into the skin.    ? KLOR-CON M20 20 MEQ tablet Take 1 tablet by mouth twice daily 30 tablet 0  ? lidocaine-prilocaine (EMLA) cream Apply 1 application topically as needed. 30 g 0  ? ondansetron (ZOFRAN) 8 MG tablet Take 1 tablet (8 mg total) by mouth every 8 (eight) hours as needed. (Patient not taking: Reported on 08/27/2021) 30 tablet 0  ? prochlorperazine (COMPAZINE) 10 MG tablet Take 1 tablet (10 mg total) by mouth every 6 (six) hours as needed for nausea or vomiting.  (Patient not taking: Reported on 08/27/2021) 30 tablet 0  ? ?No current facility-administered medications for this visit.  ? ? ?REVIEW OF SYSTEMS:   ?Constitutional: ( - ) fevers, ( - )  chills , ( - ) night sweats ?Eyes: ( - ) blurriness of vision, ( - ) double vision, ( - ) watery eyes ?Ears, nose, mouth, throat, and face: ( - ) mucositis, ( - ) sore throat ?Respiratory: ( - ) cough, ( - ) dyspnea, ( - ) wheezes ?Cardiovascular: ( - ) palpitation, ( - ) chest discomfort, ( - ) lower extremity swelling ?Gastrointestinal:  ( - ) nausea, ( - ) heartburn, ( - ) change in bowel habits ?Skin: ( - ) abnormal skin rashes ?Lymphatics: ( - ) new lymphadenopathy, ( - ) easy bruising ?Neurological: ( - ) numbness, ( - ) tingling, ( - ) new weaknesses ?Behavioral/Psych: ( - ) mood change, ( - ) new changes  ?All other systems were reviewed with the patient and are negative. ? ?  PHYSICAL EXAMINATION: ?ECOG PERFORMANCE STATUS: 1 - Symptomatic but completely ambulatory ? ?Vitals:  ? 12/02/21 1041  ?BP: 135/81  ?Pulse: 71  ?Resp: 16  ?Temp: 97.9 ?F (36.6 ?C)  ?SpO2: 100%  ? ? ?Filed Weights  ? 12/02/21 1041  ?Weight: 186 lb 6.4 oz (84.6 kg)  ? ? ? ?GENERAL: Well-appearing middle-aged [name], alert, no distress and comfortable ?SKIN: skin color, texture, turgor are normal, no rashes or significant lesions ?THROAT: No mucositis. No overt bleeding or evidence of infection.  ?EYES: conjunctiva are pink and non-injected, sclera clear ?LUNGS: clear to auscultation and percussion with normal breathing effort ?HEART: regular rate & rhythm and no murmurs and no lower extremity edema ?ABDOMEN: soft, non-tender, non-distended, normal bowel sounds.  Ostomy clean dry and intact  ?Musculoskeletal: no cyanosis of digits and no clubbing  ?PSYCH: alert & oriented x 3, fluent speech ?NEURO: no focal motor/sensory deficits ? ?LABORATORY DATA:  ?I have reviewed the data as listed ? ?  Latest Ref Rng & Units 12/02/2021  ? 10:23 AM 09/10/2021  ? 11:50  AM 08/17/2021  ?  2:27 PM  ?CBC  ?WBC 4.0 - 10.5 K/uL 4.7   3.1   3.8    ?Hemoglobin 13.0 - 17.0 g/dL 10.8   10.9   10.3    ?Hematocrit 39.0 - 52.0 % 32.8   31.8   31.3    ?Platelets 150 - 400 K/uL 20

## 2021-12-03 ENCOUNTER — Telehealth: Payer: Self-pay | Admitting: Hematology and Oncology

## 2021-12-03 NOTE — Telephone Encounter (Signed)
Scheduled per 5/10 los, pt has been called and confirmed ?

## 2021-12-04 ENCOUNTER — Other Ambulatory Visit (HOSPITAL_COMMUNITY): Payer: Self-pay | Admitting: Nurse Practitioner

## 2021-12-04 ENCOUNTER — Ambulatory Visit (HOSPITAL_COMMUNITY)
Admission: RE | Admit: 2021-12-04 | Discharge: 2021-12-04 | Disposition: A | Discharge status: Home / Self Care | Payer: Self-pay | Source: Ambulatory Visit | Attending: Nurse Practitioner | Admitting: Nurse Practitioner

## 2021-12-04 DIAGNOSIS — L259 Unspecified contact dermatitis, unspecified cause: Secondary | ICD-10-CM | POA: Insufficient documentation

## 2021-12-04 DIAGNOSIS — L24B3 Irritant contact dermatitis related to fecal or urinary stoma or fistula: Secondary | ICD-10-CM

## 2021-12-04 DIAGNOSIS — Z432 Encounter for attention to ileostomy: Secondary | ICD-10-CM

## 2021-12-04 DIAGNOSIS — C20 Malignant neoplasm of rectum: Secondary | ICD-10-CM | POA: Insufficient documentation

## 2021-12-04 DIAGNOSIS — Z433 Encounter for attention to colostomy: Secondary | ICD-10-CM | POA: Insufficient documentation

## 2021-12-04 NOTE — Discharge Instructions (Signed)
Wound care.  Pad/protect from ostomy belt pressure ?Work on getting supplies established (patient assistance)  ? ?

## 2021-12-04 NOTE — Progress Notes (Signed)
[facility]  ? ?Reason for visit:  ?LUQ ileostomy with deep creasing.   ?Today medical adhesive related skin injury at periphery of pouching area. Partial thickness ruptured blisters ?HPI:  ?Rectal cancer,  initial colostomy, revision in same area with ileostomy ?ROS  ?Review of Systems  ?Constitutional: Negative.   ?Gastrointestinal:   ?     LUQ ileostomy  trying immodium to slow down stools.   ?Genitourinary:   ?     Encouraged to drink to avoid dehydration.  Agrees to increase fluids now that Immodium is thickening stool  ?Skin:  Positive for wound.  ?     Ruptured blisters beneath adhesive pouch, treated with stoma powder and skin prep  ?All other systems reviewed and are negative. ?Vital signs:  ?BP 127/69 (BP Location: Right Arm)   Pulse 77   Temp 98.8 ?F (37.1 ?C) (Oral)   Resp 18   SpO2 100%  ?Exam:  ?Physical Exam ?Vitals reviewed.  ?Constitutional:   ?   Appearance: Normal appearance.  ?Abdominal:  ?   Palpations: Abdomen is soft.  ?   Comments: Midline suture line. Staples removed.  Proximal end with 1 cm opening.  This is likely due to pressure of ostomy belt.  Treated and bandage applied to pad/protect  ?Skin: ?   General: Skin is warm and dry.  ?   Findings: Erythema present.  ?   Comments: Peristomal breakdown  ?Neurological:  ?   Mental Status: He is alert and oriented to person, place, and time.  ?Psychiatric:     ?   Mood and Affect: Mood normal.     ?   Behavior: Behavior normal.  ?  ?Stoma type/location:  25 mm, cut pouch off center to keep pouch out of suture line.  Staples removed 12/01/[facility] at [facility] and there is 1 cm separation at proximal end.  Will implement topical therapy  ?Stomal assessment/size:  25 mm ?Peristomal assessment:  deep creasing at 3 and 9 oclock.  Ruptured blisters at 6 o'clock at distal end.  ?Treatment options for stomal/peristomal skin: stoma powder and skin prep to peristomal skin and ruptured blisters.  The denuded skin around the stoma has resolved.  Is  intact and pink.  We will continue to protect with stoma powder and skin prep.  ?Output: soft brown stool ?Ostomy pouching: 1pc [place] *[id number] with barrier ring and ostomy belt.  ?Education provided:  Skin is improving.  ?Wound care to suture line:  Cleanse with NS and pat dry.  Apply NS moist gauze to wound bed. Cover with gauze and tape. Sent home with supplies.  ?  ?Impression/dx  ?Contact dermatitis ?Breakdown at suture line ?Discussion  ?See back in one week ?Plan  ?COntinue wound care  see back in one week ?Continue working towards getting supplies established.  ? ? ? ?Visit time: 45 minutes.  ? ?[name] FNP-BC ? ?  ?

## 2021-12-15 ENCOUNTER — Ambulatory Visit (HOSPITAL_COMMUNITY)
Admission: RE | Admit: 2021-12-15 | Discharge: 2021-12-15 | Disposition: A | Discharge status: Home / Self Care | Payer: Self-pay | Source: Ambulatory Visit | Attending: Nurse Practitioner | Admitting: Nurse Practitioner

## 2021-12-15 DIAGNOSIS — C20 Malignant neoplasm of rectum: Secondary | ICD-10-CM | POA: Insufficient documentation

## 2021-12-15 DIAGNOSIS — L24B3 Irritant contact dermatitis related to fecal or urinary stoma or fistula: Secondary | ICD-10-CM

## 2021-12-15 DIAGNOSIS — Z432 Encounter for attention to ileostomy: Secondary | ICD-10-CM | POA: Insufficient documentation

## 2021-12-15 NOTE — Progress Notes (Signed)
[facility]   Reason for visit:  LUQ ileostomy with deep creasing.  HPI:  Rectal cancer, ileostomy ROS  Review of Systems  Gastrointestinal:        Midline incision healing  Skin:  Positive for rash.       Resolving contact dermatitis  Psychiatric/Behavioral:  Positive for sleep disturbance.        Up every two hours to empty and check pouch.  Anxious about leaks.  Goal is to establish predictable pouch wear time and supply security.   All other systems reviewed and are negative. Vital signs:  BP (!) 142/67   Pulse (!) 59   Temp 98 F (36.7 C) (Oral)   Resp 18   SpO2 100%  Exam:  Physical Exam Vitals reviewed.  Abdominal:     Palpations: Abdomen is soft.  Skin:    General: Skin is warm and dry.     Findings: Rash present.  Neurological:     Mental Status: He is alert and oriented to person, place, and time.  Psychiatric:        Behavior: Behavior normal.    Stoma type/location:  LUQ ileostomy Stomal assessment/size:  25 mm  Peristomal assessment:  deep creasing at 3 and 9 o'clock.  Blistering has resolved.  Pouching and wear time improved.  Treatment options for stomal/peristomal skin: Adding barrier strips.  Given samples.  Output: soft brown stool Ostomy pouching: 1pc. Convex pouch with belt, powder, skin prep and barrier ring  Education provided:  Follow up with [facility] for supplies  Samples provided.  Suture line is healing, scabbed    Impression/dx  Ileostomy Discussion  Call clinic as needed Plan  See back in 2 weeks. Establish supplies.     Visit time: 45 minutes.   [name] FNP-BC

## 2021-12-15 NOTE — Discharge Instructions (Signed)
Continue 1 piece pouch Use barrier strips to perimeter of pouch  Samples for pouching provided I will call [facility] again.

## 2021-12-16 ENCOUNTER — Other Ambulatory Visit: Payer: Self-pay | Admitting: Hematology and Oncology

## 2021-12-16 ENCOUNTER — Other Ambulatory Visit: Payer: Self-pay

## 2021-12-16 ENCOUNTER — Inpatient Hospital Stay: Payer: Self-pay

## 2021-12-16 ENCOUNTER — Telehealth: Payer: Self-pay | Admitting: *Deleted

## 2021-12-16 DIAGNOSIS — D701 Agranulocytosis secondary to cancer chemotherapy: Secondary | ICD-10-CM

## 2021-12-16 DIAGNOSIS — Z95828 Presence of other vascular implants and grafts: Secondary | ICD-10-CM

## 2021-12-16 LAB — CMP (CANCER CENTER ONLY)
ALT: 16 U/L (ref 0–44)
AST: 16 U/L (ref 15–41)
Albumin: 3.8 g/dL (ref 3.5–5.0)
Alkaline Phosphatase: 103 U/L (ref 38–126)
Anion gap: 5 (ref 5–15)
BUN: 18 mg/dL (ref 8–23)
CO2: 28 mmol/L (ref 22–32)
Calcium: 8.9 mg/dL (ref 8.9–10.3)
Chloride: 108 mmol/L (ref 98–111)
Creatinine: 1.16 mg/dL (ref 0.61–1.24)
GFR, Estimated: >60 mL/min (ref >60)
Glucose, Bld: 144 mg/dL — ABNORMAL HIGH (ref 70–99)
Potassium: 3.9 mmol/L (ref 3.5–5.1)
Sodium: 141 mmol/L (ref 135–145)
Total Bilirubin: 0.3 mg/dL (ref 0.3–1.2)
Total Protein: 7 g/dL (ref 6.5–8.1)

## 2021-12-16 LAB — CBC WITH DIFFERENTIAL (CANCER CENTER ONLY)
Abs Immature Granulocytes: 0.01 10*3/uL (ref 0.00–0.07)
Basophils Absolute: 0 10*3/uL (ref 0.0–0.1)
Basophils Relative: 1 %
Eosinophils Absolute: 0.2 10*3/uL (ref 0.0–0.5)
Eosinophils Relative: 4 %
HCT: 30.5 % — ABNORMAL LOW (ref 39.0–52.0)
Hemoglobin: 10.3 g/dL — ABNORMAL LOW (ref 13.0–17.0)
Immature Granulocytes: 0 %
Lymphocytes Relative: 12 %
Lymphs Abs: 0.6 10*3/uL — ABNORMAL LOW (ref 0.7–4.0)
MCH: 29.4 pg (ref 26.0–34.0)
MCHC: 33.8 g/dL (ref 30.0–36.0)
MCV: 87.1 fL (ref 80.0–100.0)
Monocytes Absolute: 0.3 10*3/uL (ref 0.1–1.0)
Monocytes Relative: 5 %
Neutro Abs: 3.9 10*3/uL (ref 1.7–7.7)
Neutrophils Relative %: 78 %
Platelet Count: 213 10*3/uL (ref 150–400)
RBC: 3.5 MIL/uL — ABNORMAL LOW (ref 4.22–5.81)
RDW: 13.4 % (ref 11.5–15.5)
WBC Count: 5 10*3/uL (ref 4.0–10.5)
nRBC: 0 % (ref 0.0–0.2)

## 2021-12-16 MED ORDER — HEPARIN SOD (PORK) LOCK FLUSH 100 UNIT/ML IV SOLN
500.0000 [IU] | Freq: Once | INTRAVENOUS | Status: AC
  Administered 2021-12-16: 500 [IU]

## 2021-12-16 MED ORDER — SODIUM CHLORIDE 0.9% FLUSH
10.0000 mL | Freq: Once | INTRAVENOUS | Status: AC
  Administered 2021-12-16: 10 mL

## 2021-12-16 NOTE — Telephone Encounter (Signed)
TCT patient regarding his lab results from today. Spoke with him and advised that his BUN/Creatinine are much better than they were 2 weeks ago. Pt states he has really increased his fluid intact-water/lemonade and has stopped drinking his usual 2 liter coke. Advised he continue his current oral fluid plan.   Pt appreciative of the call today.

## 2022-01-03 ENCOUNTER — Inpatient Hospital Stay (HOSPITAL_COMMUNITY): Payer: Self-pay | Admitting: Anesthesiology

## 2022-01-03 ENCOUNTER — Inpatient Hospital Stay (HOSPITAL_COMMUNITY)
Admission: EM | Admit: 2022-01-03 | Discharge: 2022-01-11 | DRG: 853 | Disposition: A | Discharge status: Home / Self Care | Payer: Self-pay | Attending: Internal Medicine | Admitting: Internal Medicine

## 2022-01-03 ENCOUNTER — Encounter (HOSPITAL_COMMUNITY): Payer: Self-pay

## 2022-01-03 ENCOUNTER — Inpatient Hospital Stay (HOSPITAL_COMMUNITY): Payer: Self-pay

## 2022-01-03 ENCOUNTER — Other Ambulatory Visit: Payer: Self-pay

## 2022-01-03 ENCOUNTER — Emergency Department (HOSPITAL_COMMUNITY): Payer: Self-pay

## 2022-01-03 ENCOUNTER — Encounter (HOSPITAL_COMMUNITY)
Admission: EM | Disposition: A | Discharge status: Home / Self Care | Payer: Self-pay | Source: Home / Self Care | Attending: Internal Medicine

## 2022-01-03 DIAGNOSIS — A4151 Sepsis due to Escherichia coli [E. coli]: Principal | ICD-10-CM | POA: Diagnosis present

## 2022-01-03 DIAGNOSIS — Z9221 Personal history of antineoplastic chemotherapy: Secondary | ICD-10-CM

## 2022-01-03 DIAGNOSIS — B9620 Unspecified Escherichia coli [E. coli] as the cause of diseases classified elsewhere: Secondary | ICD-10-CM

## 2022-01-03 DIAGNOSIS — Z85048 Personal history of other malignant neoplasm of rectum, rectosigmoid junction, and anus: Secondary | ICD-10-CM

## 2022-01-03 DIAGNOSIS — Z888 Allergy status to other drugs, medicaments and biological substances status: Secondary | ICD-10-CM

## 2022-01-03 DIAGNOSIS — G9340 Encephalopathy, unspecified: Secondary | ICD-10-CM

## 2022-01-03 DIAGNOSIS — R7989 Other specified abnormal findings of blood chemistry: Secondary | ICD-10-CM | POA: Diagnosis present

## 2022-01-03 DIAGNOSIS — A419 Sepsis, unspecified organism: Principal | ICD-10-CM | POA: Diagnosis present

## 2022-01-03 DIAGNOSIS — K7200 Acute and subacute hepatic failure without coma: Secondary | ICD-10-CM | POA: Diagnosis present

## 2022-01-03 DIAGNOSIS — D6959 Other secondary thrombocytopenia: Secondary | ICD-10-CM | POA: Diagnosis present

## 2022-01-03 DIAGNOSIS — N179 Acute kidney failure, unspecified: Secondary | ICD-10-CM | POA: Diagnosis present

## 2022-01-03 DIAGNOSIS — Z6828 Body mass index (BMI) 28.0-28.9, adult: Secondary | ICD-10-CM

## 2022-01-03 DIAGNOSIS — J9601 Acute respiratory failure with hypoxia: Secondary | ICD-10-CM | POA: Diagnosis present

## 2022-01-03 DIAGNOSIS — E663 Overweight: Secondary | ICD-10-CM | POA: Diagnosis present

## 2022-01-03 DIAGNOSIS — R7881 Bacteremia: Secondary | ICD-10-CM

## 2022-01-03 DIAGNOSIS — N289 Disorder of kidney and ureter, unspecified: Secondary | ICD-10-CM

## 2022-01-03 DIAGNOSIS — D649 Anemia, unspecified: Secondary | ICD-10-CM

## 2022-01-03 DIAGNOSIS — Z20822 Contact with and (suspected) exposure to covid-19: Secondary | ICD-10-CM | POA: Diagnosis present

## 2022-01-03 DIAGNOSIS — D696 Thrombocytopenia, unspecified: Secondary | ICD-10-CM

## 2022-01-03 DIAGNOSIS — E872 Acidosis, unspecified: Secondary | ICD-10-CM | POA: Diagnosis present

## 2022-01-03 DIAGNOSIS — R6521 Severe sepsis with septic shock: Secondary | ICD-10-CM | POA: Diagnosis present

## 2022-01-03 DIAGNOSIS — Z933 Colostomy status: Secondary | ICD-10-CM

## 2022-01-03 DIAGNOSIS — C20 Malignant neoplasm of rectum: Secondary | ICD-10-CM | POA: Diagnosis present

## 2022-01-03 DIAGNOSIS — N136 Pyonephrosis: Secondary | ICD-10-CM | POA: Diagnosis present

## 2022-01-03 DIAGNOSIS — K029 Dental caries, unspecified: Secondary | ICD-10-CM | POA: Diagnosis present

## 2022-01-03 DIAGNOSIS — B001 Herpesviral vesicular dermatitis: Secondary | ICD-10-CM | POA: Diagnosis present

## 2022-01-03 DIAGNOSIS — E876 Hypokalemia: Secondary | ICD-10-CM | POA: Diagnosis present

## 2022-01-03 DIAGNOSIS — R230 Cyanosis: Secondary | ICD-10-CM | POA: Diagnosis present

## 2022-01-03 DIAGNOSIS — N201 Calculus of ureter: Secondary | ICD-10-CM

## 2022-01-03 DIAGNOSIS — F17210 Nicotine dependence, cigarettes, uncomplicated: Secondary | ICD-10-CM | POA: Diagnosis present

## 2022-01-03 HISTORY — PX: CYSTOSCOPY W/ URETERAL STENT PLACEMENT: SHX1429

## 2022-01-03 LAB — COMPREHENSIVE METABOLIC PANEL
ALT: 22 U/L (ref 0–44)
AST: 32 U/L (ref 15–41)
Albumin: 2.8 g/dL — ABNORMAL LOW (ref 3.5–5.0)
Alkaline Phosphatase: 111 U/L (ref 38–126)
Anion gap: 18 — ABNORMAL HIGH (ref 5–15)
BUN: 31 mg/dL — ABNORMAL HIGH (ref 8–23)
CO2: 15 mmol/L — ABNORMAL LOW (ref 22–32)
Calcium: 8.7 mg/dL — ABNORMAL LOW (ref 8.9–10.3)
Chloride: 106 mmol/L (ref 98–111)
Creatinine, Ser: 3.19 mg/dL — ABNORMAL HIGH (ref 0.61–1.24)
GFR, Estimated: 21 mL/min — ABNORMAL LOW (ref >60)
Glucose, Bld: 87 mg/dL (ref 70–99)
Potassium: 3.8 mmol/L (ref 3.5–5.1)
Sodium: 139 mmol/L (ref 135–145)
Total Bilirubin: 1 mg/dL (ref 0.3–1.2)
Total Protein: 5.6 g/dL — ABNORMAL LOW (ref 6.5–8.1)

## 2022-01-03 LAB — URINALYSIS, ROUTINE W REFLEX MICROSCOPIC
Bilirubin Urine: NEGATIVE
Glucose, UA: NEGATIVE mg/dL
Ketones, ur: NEGATIVE mg/dL
Nitrite: NEGATIVE
Protein, ur: 30 mg/dL — AB
Specific Gravity, Urine: 1.017 (ref 1.005–1.030)
pH: 5 (ref 5.0–8.0)

## 2022-01-03 LAB — POCT I-STAT 7, (LYTES, BLD GAS, ICA,H+H)
Acid-base deficit: 14 mmol/L — ABNORMAL HIGH (ref 0.0–2.0)
Acid-base deficit: 9 mmol/L — ABNORMAL HIGH (ref 0.0–2.0)
Acid-base deficit: 9 mmol/L — ABNORMAL HIGH (ref 0.0–2.0)
Bicarbonate: 14.6 mmol/L — ABNORMAL LOW (ref 20.0–28.0)
Bicarbonate: 16.7 mmol/L — ABNORMAL LOW (ref 20.0–28.0)
Bicarbonate: 16.4 mmol/L — ABNORMAL LOW (ref 20.0–28.0)
Calcium, Ion: 1.19 mmol/L (ref 1.15–1.40)
Calcium, Ion: 1.11 mmol/L — ABNORMAL LOW (ref 1.15–1.40)
Calcium, Ion: 1.08 mmol/L — ABNORMAL LOW (ref 1.15–1.40)
HCT: 27 % — ABNORMAL LOW (ref 39.0–52.0)
HCT: 25 % — ABNORMAL LOW (ref 39.0–52.0)
HCT: 26 % — ABNORMAL LOW (ref 39.0–52.0)
Hemoglobin: 9.2 g/dL — ABNORMAL LOW (ref 13.0–17.0)
Hemoglobin: 8.5 g/dL — ABNORMAL LOW (ref 13.0–17.0)
Hemoglobin: 8.8 g/dL — ABNORMAL LOW (ref 13.0–17.0)
O2 Saturation: 96 %
O2 Saturation: 100 %
O2 Saturation: 96 %
Patient temperature: 36.4
Patient temperature: 37.2
Potassium: 4 mmol/L (ref 3.5–5.1)
Potassium: 4.3 mmol/L (ref 3.5–5.1)
Potassium: 4.6 mmol/L (ref 3.5–5.1)
Sodium: 136 mmol/L (ref 135–145)
Sodium: 138 mmol/L (ref 135–145)
Sodium: 138 mmol/L (ref 135–145)
TCO2: 16 mmol/L — ABNORMAL LOW (ref 22–32)
TCO2: 18 mmol/L — ABNORMAL LOW (ref 22–32)
TCO2: 17 mmol/L — ABNORMAL LOW (ref 22–32)
pCO2 arterial: 44.1 mmHg (ref 32–48)
pCO2 arterial: 34.4 mmHg (ref 32–48)
pCO2 arterial: 32.9 mmHg (ref 32–48)
pH, Arterial: 7.127 — CL (ref 7.35–7.45)
pH, Arterial: 7.291 — ABNORMAL LOW (ref 7.35–7.45)
pH, Arterial: 7.306 — ABNORMAL LOW (ref 7.35–7.45)
pO2, Arterial: 104 mmHg (ref 83–108)
pO2, Arterial: 254 mmHg — ABNORMAL HIGH (ref 83–108)
pO2, Arterial: 93 mmHg (ref 83–108)

## 2022-01-03 LAB — BLOOD CULTURE ID PANEL (REFLEXED) - BCID2

## 2022-01-03 LAB — I-STAT VENOUS BLOOD GAS, ED
Acid-base deficit: 12 mmol/L — ABNORMAL HIGH (ref 0.0–2.0)
Bicarbonate: 14 mmol/L — ABNORMAL LOW (ref 20.0–28.0)
Calcium, Ion: 1.12 mmol/L — ABNORMAL LOW (ref 1.15–1.40)
HCT: 30 % — ABNORMAL LOW (ref 39.0–52.0)
Hemoglobin: 10.2 g/dL — ABNORMAL LOW (ref 13.0–17.0)
O2 Saturation: 67 %
Potassium: 3.8 mmol/L (ref 3.5–5.1)
Sodium: 139 mmol/L (ref 135–145)
TCO2: 15 mmol/L — ABNORMAL LOW (ref 22–32)
pCO2, Ven: 30.6 mmHg — ABNORMAL LOW (ref 44–60)
pH, Ven: 7.269 (ref 7.25–7.43)
pO2, Ven: 39 mmHg (ref 32–45)

## 2022-01-03 LAB — CBC
HCT: 28.6 % — ABNORMAL LOW (ref 39.0–52.0)
Hemoglobin: 9.6 g/dL — ABNORMAL LOW (ref 13.0–17.0)
MCH: 29.5 pg (ref 26.0–34.0)
MCHC: 33.6 g/dL (ref 30.0–36.0)
MCV: 88 fL (ref 80.0–100.0)
Platelets: 117 K/uL — ABNORMAL LOW (ref 150–400)
RBC: 3.25 MIL/uL — ABNORMAL LOW (ref 4.22–5.81)
RDW: 14.4 % (ref 11.5–15.5)
WBC: 15.6 K/uL — ABNORMAL HIGH (ref 4.0–10.5)
nRBC: 0.1 % (ref 0.0–0.2)

## 2022-01-03 LAB — CBC WITH DIFFERENTIAL/PLATELET
Abs Immature Granulocytes: 1 10*3/uL — ABNORMAL HIGH (ref 0.00–0.07)
Basophils Absolute: 0.1 10*3/uL (ref 0.0–0.1)
Basophils Relative: 1 %
Eosinophils Absolute: 0 10*3/uL (ref 0.0–0.5)
Eosinophils Relative: 0 %
HCT: 33 % — ABNORMAL LOW (ref 39.0–52.0)
Hemoglobin: 10.6 g/dL — ABNORMAL LOW (ref 13.0–17.0)
Lymphocytes Relative: 3 %
Lymphs Abs: 0.3 10*3/uL — ABNORMAL LOW (ref 0.7–4.0)
MCH: 29.4 pg (ref 26.0–34.0)
MCHC: 32.1 g/dL (ref 30.0–36.0)
MCV: 91.7 fL (ref 80.0–100.0)
Metamyelocytes Relative: 9 %
Monocytes Absolute: 0.1 10*3/uL (ref 0.1–1.0)
Monocytes Relative: 1 %
Myelocytes: 3 %
Neutro Abs: 7 10*3/uL (ref 1.7–7.7)
Neutrophils Relative %: 83 %
Platelets: UNDETERMINED 10*3/uL (ref 150–400)
RBC: 3.6 MIL/uL — ABNORMAL LOW (ref 4.22–5.81)
RDW: 14.2 % (ref 11.5–15.5)
WBC: 8.4 10*3/uL (ref 4.0–10.5)
nRBC: 0.5 % — ABNORMAL HIGH (ref 0.0–0.2)
nRBC: 3 /100 WBC — ABNORMAL HIGH

## 2022-01-03 LAB — BASIC METABOLIC PANEL WITH GFR
Anion gap: 15 (ref 5–15)
BUN: 33 mg/dL — ABNORMAL HIGH (ref 8–23)
CO2: 16 mmol/L — ABNORMAL LOW (ref 22–32)
Calcium: 8.1 mg/dL — ABNORMAL LOW (ref 8.9–10.3)
Chloride: 105 mmol/L (ref 98–111)
Creatinine, Ser: 3.17 mg/dL — ABNORMAL HIGH (ref 0.61–1.24)
GFR, Estimated: 21 mL/min — ABNORMAL LOW
Glucose, Bld: 100 mg/dL — ABNORMAL HIGH (ref 70–99)
Potassium: 3.4 mmol/L — ABNORMAL LOW (ref 3.5–5.1)
Sodium: 136 mmol/L (ref 135–145)

## 2022-01-03 LAB — PROTIME-INR
INR: 1.4 — ABNORMAL HIGH (ref 0.8–1.2)
Prothrombin Time: 16.8 seconds — ABNORMAL HIGH (ref 11.4–15.2)

## 2022-01-03 LAB — RESP PANEL BY RT-PCR (FLU A&B, COVID) ARPGX2
Influenza A by PCR: NEGATIVE
Influenza B by PCR: NEGATIVE
SARS Coronavirus 2 by RT PCR: NEGATIVE

## 2022-01-03 LAB — GLUCOSE, CAPILLARY
Glucose-Capillary: 65 mg/dL — ABNORMAL LOW (ref 70–99)
Glucose-Capillary: 86 mg/dL (ref 70–99)
Glucose-Capillary: 97 mg/dL (ref 70–99)
Glucose-Capillary: 79 mg/dL (ref 70–99)
Glucose-Capillary: 99 mg/dL (ref 70–99)
Glucose-Capillary: 92 mg/dL (ref 70–99)

## 2022-01-03 LAB — LACTIC ACID, PLASMA
Lactic Acid, Venous: 7.6 mmol/L (ref 0.5–1.9)
Lactic Acid, Venous: 8.3 mmol/L (ref 0.5–1.9)
Lactic Acid, Venous: 4.2 mmol/L (ref 0.5–1.9)
Lactic Acid, Venous: 5.2 mmol/L (ref 0.5–1.9)
Lactic Acid, Venous: 5.9 mmol/L (ref 0.5–1.9)
Lactic Acid, Venous: 5.7 mmol/L (ref 0.5–1.9)

## 2022-01-03 LAB — MAGNESIUM: Magnesium: 1.1 mg/dL — ABNORMAL LOW (ref 1.7–2.4)

## 2022-01-03 LAB — PHOSPHORUS: Phosphorus: 4.3 mg/dL (ref 2.5–4.6)

## 2022-01-03 LAB — APTT: aPTT: 35 seconds (ref 24–36)

## 2022-01-03 LAB — MRSA NEXT GEN BY PCR, NASAL: MRSA by PCR Next Gen: NOT DETECTED

## 2022-01-03 LAB — CBG MONITORING, ED: Glucose-Capillary: 81 mg/dL (ref 70–99)

## 2022-01-03 SURGERY — CYSTOSCOPY, FLEXIBLE, WITH STENT REPLACEMENT
Anesthesia: General | Site: Ureter | Laterality: Right

## 2022-01-03 MED ORDER — VANCOMYCIN HCL 1750 MG/350ML IV SOLN
1750.0000 mg | Freq: Once | INTRAVENOUS | Status: AC
  Administered 2022-01-03: 1750 mg via INTRAVENOUS
  Filled 2022-01-03: qty 350

## 2022-01-03 MED ORDER — POLYETHYLENE GLYCOL 3350 17 G PO PACK: 17.0000 g | PACK | Freq: Every day | ORAL | Status: DC | PRN

## 2022-01-03 MED ORDER — DOCUSATE SODIUM 100 MG PO CAPS: 100.0000 mg | ORAL_CAPSULE | Freq: Two times a day (BID) | ORAL | Status: DC | PRN

## 2022-01-03 MED ORDER — NOREPINEPHRINE 4 MG/250ML-% IV SOLN
INTRAVENOUS | Status: DC | PRN
  Administered 2022-01-03: 18 ug/min via INTRAVENOUS

## 2022-01-03 MED ORDER — HEPARIN SODIUM (PORCINE) 5000 UNIT/ML IJ SOLN
5000.0000 [IU] | Freq: Three times a day (TID) | INTRAMUSCULAR | Status: DC
  Administered 2022-01-03 – 2022-01-04 (×3): 5000 [IU] via SUBCUTANEOUS
  Filled 2022-01-03 (×3): qty 1

## 2022-01-03 MED ORDER — SODIUM CHLORIDE 0.9 % IV SOLN
INTRAVENOUS | Status: DC | PRN
  Administered 2022-01-03: 10 mL/h via INTRAVENOUS

## 2022-01-03 MED ORDER — LACTATED RINGERS IV SOLN: INTRAVENOUS | Status: DC | PRN

## 2022-01-03 MED ORDER — ORAL CARE MOUTH RINSE
15.0000 mL | OROMUCOSAL | Status: DC
  Administered 2022-01-03 – 2022-01-05 (×17): 15 mL via OROMUCOSAL

## 2022-01-03 MED ORDER — PROPOFOL 500 MG/50ML IV EMUL
INTRAVENOUS | Status: DC | PRN
  Administered 2022-01-03: 25 ug/kg/min via INTRAVENOUS

## 2022-01-03 MED ORDER — ACETAMINOPHEN 650 MG RE SUPP: 650.0000 mg | Freq: Once | RECTAL | Status: AC

## 2022-01-03 MED ORDER — LACTATED RINGERS IV BOLUS (SEPSIS)
1000.0000 mL | Freq: Once | INTRAVENOUS | Status: AC
  Administered 2022-01-03: 1000 mL via INTRAVENOUS

## 2022-01-03 MED ORDER — ACETAMINOPHEN 650 MG RE SUPP
RECTAL | Status: AC
  Administered 2022-01-03: 650 mg via RECTAL
  Filled 2022-01-03: qty 1

## 2022-01-03 MED ORDER — ORAL CARE MOUTH RINSE
15.0000 mL | Freq: Two times a day (BID) | OROMUCOSAL | Status: DC
  Administered 2022-01-03: 15 mL via OROMUCOSAL

## 2022-01-03 MED ORDER — SODIUM CHLORIDE 0.9 % IV SOLN
2.0000 g | INTRAVENOUS | Status: DC
  Administered 2022-01-03: 2 g via INTRAVENOUS
  Filled 2022-01-03: qty 12.5

## 2022-01-03 MED ORDER — ONDANSETRON HCL 4 MG/2ML IJ SOLN: 4.0000 mg | Freq: Four times a day (QID) | INTRAMUSCULAR | Status: DC | PRN

## 2022-01-03 MED ORDER — LACTATED RINGERS IV BOLUS (SEPSIS)
500.0000 mL | Freq: Once | INTRAVENOUS | Status: AC
  Administered 2022-01-03: 500 mL via INTRAVENOUS

## 2022-01-03 MED ORDER — VASOPRESSIN 20 UNITS/100 ML INFUSION FOR SHOCK
0.0000 [IU]/min | INTRAVENOUS | Status: DC
  Administered 2022-01-03 – 2022-01-04 (×5): 0.03 [IU]/min via INTRAVENOUS
  Filled 2022-01-03 (×4): qty 100

## 2022-01-03 MED ORDER — SODIUM CHLORIDE 0.9% FLUSH
10.0000 mL | INTRAVENOUS | Status: DC | PRN
  Administered 2022-01-03: 10 mL

## 2022-01-03 MED ORDER — ACETAMINOPHEN 325 MG PO TABS
650.0000 mg | ORAL_TABLET | ORAL | Status: DC | PRN
  Administered 2022-01-03 – 2022-01-08 (×4): 650 mg via ORAL
  Filled 2022-01-03 (×4): qty 2

## 2022-01-03 MED ORDER — CEFEPIME HCL 2 G IV SOLR
2.0000 g | Freq: Once | INTRAVENOUS | Status: AC
  Administered 2022-01-03: 2 g via INTRAVENOUS
  Filled 2022-01-03: qty 12.5

## 2022-01-03 MED ORDER — LACTATED RINGERS IV BOLUS
500.0000 mL | Freq: Once | INTRAVENOUS | Status: AC
Start: 2022-01-03 — End: 2022-01-03
  Administered 2022-01-03: 500 mL via INTRAVENOUS

## 2022-01-03 MED ORDER — CALCIUM GLUCONATE-NACL 1-0.675 GM/50ML-% IV SOLN
1.0000 g | Freq: Once | INTRAVENOUS | Status: AC
  Administered 2022-01-03: 1000 mg via INTRAVENOUS
  Filled 2022-01-03: qty 50

## 2022-01-03 MED ORDER — CHLORHEXIDINE GLUCONATE 0.12% ORAL RINSE (MEDLINE KIT)
15.0000 mL | Freq: Two times a day (BID) | OROMUCOSAL | Status: DC
  Administered 2022-01-03 – 2022-01-05 (×4): 15 mL via OROMUCOSAL

## 2022-01-03 MED ORDER — METRONIDAZOLE 500 MG/100ML IV SOLN
500.0000 mg | Freq: Three times a day (TID) | INTRAVENOUS | Status: DC
  Administered 2022-01-03 – 2022-01-04 (×4): 500 mg via INTRAVENOUS
  Filled 2022-01-03 (×3): qty 100

## 2022-01-03 MED ORDER — LACTATED RINGERS IV BOLUS
1000.0000 mL | Freq: Once | INTRAVENOUS | Status: AC
  Administered 2022-01-03: 1000 mL via INTRAVENOUS

## 2022-01-03 MED ORDER — SODIUM CHLORIDE 0.9% FLUSH
10.0000 mL | Freq: Two times a day (BID) | INTRAVENOUS | Status: DC
  Administered 2022-01-03: 10 mL
  Administered 2022-01-03: 40 mL
  Administered 2022-01-04 – 2022-01-10 (×8): 10 mL

## 2022-01-03 MED ORDER — SODIUM CHLORIDE 0.9 % IR SOLN
Status: DC | PRN
  Administered 2022-01-03: 3000 mL

## 2022-01-03 MED ORDER — SUCCINYLCHOLINE CHLORIDE 200 MG/10ML IV SOSY
PREFILLED_SYRINGE | INTRAVENOUS | Status: DC | PRN
  Administered 2022-01-03: 100 mg via INTRAVENOUS

## 2022-01-03 MED ORDER — NOREPINEPHRINE 16 MG/250ML-% IV SOLN
0.0000 ug/min | INTRAVENOUS | Status: DC
  Administered 2022-01-03: 16 ug/min via INTRAVENOUS
  Administered 2022-01-03: 25 ug/min via INTRAVENOUS
  Administered 2022-01-03: 8 ug/min via INTRAVENOUS
  Administered 2022-01-04: 6 ug/min via INTRAVENOUS
  Filled 2022-01-03 (×2): qty 250

## 2022-01-03 MED ORDER — ORAL CARE MOUTH RINSE: 15.0000 mL | OROMUCOSAL | Status: DC

## 2022-01-03 MED ORDER — LACTATED RINGERS IV BOLUS
1000.0000 mL | Freq: Once | INTRAVENOUS | Status: AC
Start: 2022-01-03 — End: 2022-01-03
  Administered 2022-01-03: 1000 mL via INTRAVENOUS

## 2022-01-03 MED ORDER — VANCOMYCIN VARIABLE DOSE PER UNSTABLE RENAL FUNCTION (PHARMACIST DOSING)
Status: DC
Start: 2022-01-03 — End: 2022-01-04

## 2022-01-03 MED ORDER — IOHEXOL 300 MG/ML  SOLN
INTRAMUSCULAR | Status: DC | PRN
  Administered 2022-01-03: 2 mL via URETHRAL

## 2022-01-03 MED ORDER — LACTATED RINGERS IV BOLUS
2000.0000 mL | Freq: Once | INTRAVENOUS | Status: AC
  Administered 2022-01-03: 2000 mL via INTRAVENOUS

## 2022-01-03 MED ORDER — NOREPINEPHRINE 4 MG/250ML-% IV SOLN
0.0000 ug/min | INTRAVENOUS | Status: DC
  Administered 2022-01-03: 2 ug/min via INTRAVENOUS
  Filled 2022-01-03: qty 250

## 2022-01-03 MED ORDER — ALBUMIN HUMAN 5 % IV SOLN
25.0000 g | Freq: Once | INTRAVENOUS | Status: AC
Start: 2022-01-03 — End: 2022-01-03
  Administered 2022-01-03: 25 g via INTRAVENOUS
  Filled 2022-01-03: qty 500

## 2022-01-03 MED ORDER — CHLORHEXIDINE GLUCONATE 0.12% ORAL RINSE (MEDLINE KIT): 15.0000 mL | Freq: Two times a day (BID) | OROMUCOSAL | Status: DC

## 2022-01-03 MED ORDER — MAGNESIUM SULFATE 4 GM/100ML IV SOLN
4.0000 g | Freq: Once | INTRAVENOUS | Status: AC
  Administered 2022-01-03: 4 g via INTRAVENOUS
  Filled 2022-01-03: qty 100

## 2022-01-03 MED ORDER — ETOMIDATE 2 MG/ML IV SOLN
INTRAVENOUS | Status: DC | PRN
  Administered 2022-01-03: 18 mg via INTRAVENOUS

## 2022-01-03 MED ORDER — ROCURONIUM BROMIDE 100 MG/10ML IV SOLN
INTRAVENOUS | Status: DC | PRN
  Administered 2022-01-03: 50 mg via INTRAVENOUS

## 2022-01-03 MED ORDER — FENTANYL CITRATE (PF) 250 MCG/5ML IJ SOLN
INTRAMUSCULAR | Status: AC
  Filled 2022-01-03: qty 5

## 2022-01-03 MED ORDER — LACTATED RINGERS IV SOLN: INTRAVENOUS | Status: DC

## 2022-01-03 MED ORDER — PROPOFOL 1000 MG/100ML IV EMUL
5.0000 ug/kg/min | INTRAVENOUS | Status: DC
  Administered 2022-01-03: 35 ug/kg/min via INTRAVENOUS
  Administered 2022-01-03: 50 ug/kg/min via INTRAVENOUS
  Administered 2022-01-03: 25 ug/kg/min via INTRAVENOUS
  Administered 2022-01-03: 50 ug/kg/min via INTRAVENOUS
  Administered 2022-01-04: 25 ug/kg/min via INTRAVENOUS
  Filled 2022-01-03 (×3): qty 100

## 2022-01-03 MED ORDER — CHLORHEXIDINE GLUCONATE CLOTH 2 % EX PADS
6.0000 | MEDICATED_PAD | Freq: Every day | CUTANEOUS | Status: DC
  Administered 2022-01-03 – 2022-01-11 (×9): 6 via TOPICAL

## 2022-01-03 MED ORDER — ALBUMIN HUMAN 5 % IV SOLN: INTRAVENOUS | Status: DC | PRN

## 2022-01-03 MED ORDER — SODIUM BICARBONATE 8.4 % IV SOLN
INTRAVENOUS | Status: DC | PRN
  Administered 2022-01-03 (×2): 50 meq via INTRAVENOUS

## 2022-01-03 SURGICAL SUPPLY — 21 items
BAG DRN RND TRDRP ANRFLXCHMBR (UROLOGICAL SUPPLIES) ×1
BAG URINE DRAIN 2000ML AR STRL (UROLOGICAL SUPPLIES) ×2 IMPLANT
BAG URO CATCHER STRL LF (MISCELLANEOUS) ×2 IMPLANT
CATH FOLEY 2WAY SLVR  5CC 18FR (CATHETERS) ×2
CATH FOLEY 2WAY SLVR 5CC 18FR (CATHETERS) IMPLANT
CATH URET 5FR 28IN OPEN ENDED (CATHETERS) ×1 IMPLANT
GLOVE BIO SURGEON STRL SZ7.5 (GLOVE) ×1 IMPLANT
GOWN STRL REUS W/ TWL LRG LVL3 (GOWN DISPOSABLE) ×1 IMPLANT
GOWN STRL REUS W/ TWL XL LVL3 (GOWN DISPOSABLE) ×1 IMPLANT
GOWN STRL REUS W/TWL LRG LVL3 (GOWN DISPOSABLE) ×4
GOWN STRL REUS W/TWL XL LVL3 (GOWN DISPOSABLE) ×2
GUIDEWIRE SENSOR ANG DUAL FLEX (WIRE) ×1 IMPLANT
GUIDEWIRE STR DUAL SENSOR (WIRE) ×1 IMPLANT
KIT TURNOVER KIT B (KITS) ×2 IMPLANT
MANIFOLD NEPTUNE II (INSTRUMENTS) ×1 IMPLANT
NS IRRIG 1000ML POUR BTL (IV SOLUTION) ×1 IMPLANT
PACK CYSTO (CUSTOM PROCEDURE TRAY) ×2 IMPLANT
STENT URET 6FRX26 CONTOUR (STENTS) ×1 IMPLANT
SYR 20ML LL LF (SYRINGE) ×1 IMPLANT
TOWEL GREEN STERILE FF (TOWEL DISPOSABLE) ×2 IMPLANT
TUBE CONNECTING 12X1/4 (SUCTIONS) ×1 IMPLANT

## 2022-01-03 NOTE — Anesthesia Preprocedure Evaluation (Addendum)
Anesthesia Evaluation  Patient identified by MRN, date of birth, ID band Patient awake    Reviewed: Allergy & Precautions, NPO status , Patient's Chart, lab work & pertinent test results  History of Anesthesia Complications Negative for: history of anesthetic complications  Airway Mallampati: III  TM Distance: >3 FB Neck ROM: Full    Dental  (+) Poor Dentition, Loose, Dental Advisory Given, Missing, Chipped   Pulmonary shortness of breath, Current Smoker and Patient abstained from smoking.,    breath sounds clear to auscultation       Cardiovascular  Rhythm:Regular Rate:Tachycardia  norepi and Vasopressin infusions: septic shock   Neuro/Psych negative neurological ROS     GI/Hepatic Neg liver ROS, rectosigmoid cancer: finished Chemo in Dec   Endo/Other  negative endocrine ROS  Renal/GU Renal InsufficiencyRenal disease     Musculoskeletal   Abdominal   Peds  Hematology  (+) Blood dyscrasia (Hb 9.6, plt 117k), anemia ,   Anesthesia Other Findings   Reproductive/Obstetrics                            Anesthesia Physical Anesthesia Plan  ASA: 4  Anesthesia Plan: General   Post-op Pain Management: Tylenol PO (pre-op)*   Induction: Intravenous and Rapid sequence  PONV Risk Score and Plan: 1 and Ondansetron and Dexamethasone  Airway Management Planned: Oral ETT and Video Laryngoscope Planned  Additional Equipment: None  Intra-op Plan:   Post-operative Plan: Possible Post-op intubation/ventilation  Informed Consent: I have reviewed the patients History and Physical, chart, labs and discussed the procedure including the risks, benefits and alternatives for the proposed anesthesia with the patient or authorized representative who has indicated his/her understanding and acceptance.     Dental advisory given  Plan Discussed with: CRNA and Surgeon  Anesthesia Plan Comments:         Anesthesia Quick Evaluation

## 2022-01-03 NOTE — Consult Note (Signed)
CC: chills  Requesting provider: Dr Vaughan Browner  HPI: Curtis [name] is an 66 y.o. male who is here for septic shock and we were asked to evaluate presacral fluid collection to see if this was the potential source for his sepsis.  Patient has rectal cancer T4 N2 M0, stage IIIc.  Patient underwent diverting loop colostomy last summer and then completed chemo radiation on August 21, 2021.  He then underwent open LAR and diverting loop ileostomy on November 04, 2021 at [facility] with Dr. Hester Mates.  He saw them a few weeks ago and they were talking about reversing his loop ileostomy this summer.  He states he was doing well until yesterday when he developed severe chills.  He also developed vomiting.  He denies any change in his ostomy output.  He denies any abdominal pain.  No productive cough or respiratory symptoms.  He does have some chronic skin issues around his ostomy site.  He does have a history of dental caries.  He was found to be hypotensive on admission requiring initiation of vasopressor.  He underwent a noncontrasted CT scan because he was found to have acute kidney injury.  There was findings of a presacral fluid collection as well as kidney stone with hydroureter.  CCM contacted asked to comment on the presacral fluid collection as a potential source of his sepsis.  Past Medical History:  Diagnosis Date   Rectosigmoid cancer [facility])     Past Surgical History:  Procedure Laterality Date   BIOPSY  01/21/2021   Procedure: BIOPSY;  Surgeon: Mauri Pole, MD;  Location: [place];  Service: Endoscopy;;   COLON RESECTION N/A 01/21/2021   Procedure: LAPAROSCOPIC ASSISTED LOOP COLOSTOMY;  Surgeon: Donnie Mesa, MD;  Location: [place];  Service: General;  Laterality: N/A;   FLEXIBLE SIGMOIDOSCOPY N/A 01/21/2021   Procedure: FLEXIBLE SIGMOIDOSCOPY;  Surgeon: Mauri Pole, MD;  Location: [place] [facility];  Service: Endoscopy;  Laterality: N/A;   IR IMAGING GUIDED PORT INSERTION  03/04/2021    SUBMUCOSAL TATTOO INJECTION  01/21/2021   Procedure: SUBMUCOSAL TATTOO INJECTION;  Surgeon: Mauri Pole, MD;  Location: [facility];  Service: Endoscopy;;    Family History  Problem Relation Age of Onset   Hypertension Mother    Hypertension Father    Breast cancer Sister     Social:  reports that he has been smoking cigarettes. He has never used smokeless tobacco. He reports that he does not drink alcohol and does not use drugs.  Allergies:  Allergies  Allergen Reactions   Leucovorin Itching and Rash    Pt. had a reaction during infusion with Oxaliplatin. Complained of itching "all over". Red, raised rash noted to anterior chest, upper back, and abdominal area. See progress note 06/22/21  Medicated pt. with Benadryl 50 mg IV then Solumedrol '125mg'$  IV. Waited 20 minutes and chemotherapy restarted   Oxaliplatin Itching and Rash    Pt. had a reaction of itching all over and red, raised rash noted to anterior upper chest, upper back, and abdominal area. See progress note 06/22/21 Medicated pt. with Benadryl 50 mg IV then Solumedrol '125mg'$  IV. Waited 20 minutes and chemotherapy restarted    Medications: I have reviewed the patient's current medications.   ROS - all of the below systems have been reviewed with the patient and positives are indicated with bold text General: chills, fever or night sweats Eyes: blurry vision or double vision ENT: epistaxis or sore throat Allergy/Immunology: itchy/watery eyes or nasal congestion Hematologic/Lymphatic: bleeding  problems, blood clots or swollen lymph nodes Endocrine: temperature intolerance or unexpected weight changes Breast: new or changing breast lumps or nipple discharge Resp: cough, shortness of breath, or wheezing CV: chest pain or dyspnea on exertion GI: as per HPI GU: dysuria, trouble voiding, or hematuria MSK: joint pain or joint stiffness Neuro: TIA or stroke symptoms Derm: pruritus and skin lesion changes Psych:  anxiety and depression  PE Blood pressure 107/60, pulse (!) 104, temperature 99.1 F (37.3 C), temperature source Axillary, resp. rate (!) 25, height '5\' 10"'$  (1.778 m), weight 88.1 kg, SpO2 92 %. Constitutional: NAD; conversant; no deformities Eyes: Moist conjunctiva; no lid lag; anicteric; PERRL Neck: Trachea midline; no thyromegaly Lungs: Normal respiratory effort; no tactile fremitus CV: RRR; no palpable thrills; no pitting edema GI: Abd soft, nontender, old incision.  Ostomy in left abdomen.  Stool in bag.; no palpable hepatosplenomegaly MSK:  no clubbing/cyanosis Psychiatric: Appropriate affect; alert and oriented x3 Lymphatic: No palpable cervical or axillary lymphadenopathy Skin: No jaundice  Results for orders placed or performed during the hospital encounter of 01/03/22 (from the past 48 hour(s))  Comprehensive metabolic panel     Status: Abnormal   Collection Time: 01/03/22  2:33 AM  Result Value Ref Range   Sodium 139 135 - 145 mmol/L   Potassium 3.8 3.5 - 5.1 mmol/L   Chloride 106 98 - 111 mmol/L   CO2 15 (L) 22 - 32 mmol/L   Glucose, Bld 87 70 - 99 mg/dL    Comment: Glucose reference range applies only to samples taken after fasting for at least 8 hours.   BUN 31 (H) 8 - 23 mg/dL   Creatinine, Ser 3.19 (H) 0.61 - 1.24 mg/dL   Calcium 8.7 (L) 8.9 - 10.3 mg/dL   Total Protein 5.6 (L) 6.5 - 8.1 g/dL   Albumin 2.8 (L) 3.5 - 5.0 g/dL   AST 32 15 - 41 U/L   ALT 22 0 - 44 U/L    Comment: RESULTS CONFIRMED BY MANUAL DILUTION   Alkaline Phosphatase 111 38 - 126 U/L   Total Bilirubin 1.0 0.3 - 1.2 mg/dL   GFR, Estimated 21 (L) >60 mL/min    Comment: (NOTE) Calculated using the CKD-EPI Creatinine Equation (2021)    Anion gap 18 (H) 5 - 15    Comment: Performed at [facility], [place] [address], [place], [place] [address]  CBC with Differential     Status: Abnormal   Collection Time: 01/03/22  2:33 AM  Result Value Ref Range   WBC 8.4 4.0 - 10.5 K/uL   RBC 3.60 (L)  4.22 - 5.81 MIL/uL   Hemoglobin 10.6 (L) 13.0 - 17.0 g/dL   HCT 33.0 (L) 39.0 - 52.0 %   MCV 91.7 80.0 - 100.0 fL   MCH 29.4 26.0 - 34.0 pg   MCHC 32.1 30.0 - 36.0 g/dL   RDW 14.2 11.5 - 15.5 %   Platelets PLATELET CLUMPS NOTED ON SMEAR, UNABLE TO ESTIMATE 150 - 400 K/uL    Comment: PLATELET CLUMPS NOTED ON SMEAR, UNABLE TO ESTIMATE   nRBC 0.5 (H) 0.0 - 0.2 %   Neutrophils Relative % 83 %   Neutro Abs 7.0 1.7 - 7.7 K/uL   Lymphocytes Relative 3 %   Lymphs Abs 0.3 (L) 0.7 - 4.0 K/uL   Monocytes Relative 1 %   Monocytes Absolute 0.1 0.1 - 1.0 K/uL   Eosinophils Relative 0 %   Eosinophils Absolute 0.0 0.0 - 0.5 K/uL  Basophils Relative 1 %   Basophils Absolute 0.1 0.0 - 0.1 K/uL   RBC Morphology MORPHOLOGY UNREMARKABLE    nRBC 3 (H) 0 /100 WBC   Metamyelocytes Relative 9 %   Myelocytes 3 %   Abs Immature Granulocytes 1.00 (H) 0.00 - 0.07 K/uL    Comment: Performed at [facility] [address], [place], [place] [address]  Protime-INR     Status: Abnormal   Collection Time: 01/03/22  2:33 AM  Result Value Ref Range   Prothrombin Time 16.8 (H) 11.4 - 15.2 seconds   INR 1.4 (H) 0.8 - 1.2    Comment: (NOTE) INR goal varies based on device and disease states. Performed at [facility], [place] [address] [place], [place] [address]   APTT     Status: None   Collection Time: 01/03/22  2:33 AM  Result Value Ref Range   aPTT 35 24 - 36 seconds    Comment: Performed at [facility] [address], [place], [place] [address]  I-Stat venous blood gas, [facility] ED only)     Status: Abnormal   Collection Time: 01/03/22  2:34 AM  Result Value Ref Range   pH, Ven 7.269 7.25 - 7.43   pCO2, Ven 30.6 (L) 44 - 60 mmHg   pO2, Ven 39 32 - 45 mmHg   Bicarbonate 14.0 (L) 20.0 - 28.0 mmol/L   TCO2 15 (L) 22 - 32 mmol/L   O2 Saturation 67 %   Acid-base deficit 12.0 (H) 0.0 - 2.0 mmol/L   Sodium 139 135 - 145 mmol/L   Potassium 3.8 3.5 - 5.1 mmol/L   Calcium, Ion 1.12 (L) 1.15 -  1.40 mmol/L   HCT 30.0 (L) 39.0 - 52.0 %   Hemoglobin 10.2 (L) 13.0 - 17.0 g/dL   Sample type VENOUS    Comment NOTIFIED PHYSICIAN   Lactic acid, plasma     Status: Abnormal   Collection Time: 01/03/22  2:35 AM  Result Value Ref Range   Lactic Acid, Venous 7.6 (HH) 0.5 - 1.9 mmol/L    Comment: CRITICAL RESULT CALLED TO, READ BACK BY AND VERIFIED WITH: Doylene Bode, RN [phone] 06.11.23 MRivet Performed at [facility], [address] [place],  [address]   CBG monitoring, ED     Status: None   Collection Time: 01/03/22  2:37 AM  Result Value Ref Range   Glucose-Capillary 81 70 - 99 mg/dL    Comment: Glucose reference range applies only to samples taken after fasting for at least 8 hours.   Comment 1 Notify RN    Comment 2 Document in Chart   Resp Panel by RT-PCR (Flu A&B, Covid) Anterior Nasal Swab     Status: None   Collection Time: 01/03/22  2:40 AM   Specimen: Anterior Nasal Swab  Result Value Ref Range   SARS Coronavirus 2 by RT PCR NEGATIVE NEGATIVE    Comment: (NOTE) SARS-CoV-2 target nucleic acids are NOT DETECTED.  The SARS-CoV-2 RNA is generally detectable in upper respiratory specimens during the acute phase of infection. The lowest concentration of SARS-CoV-2 viral copies this assay can detect is 138 copies/mL. A negative result does not preclude SARS-Cov-2 infection and should not be used as the sole basis for treatment or other patient management decisions. A negative result may occur with  improper specimen collection/handling, submission of specimen other than nasopharyngeal swab, presence of viral mutation(s) within the areas targeted by this assay, and  inadequate number of viral copies(<138 copies/mL). A negative result must be combined with clinical observations, patient history, and epidemiological information. The expected result is Negative.  Fact Sheet for Patients:  EntrepreneurPulse.com.au  Fact Sheet for Healthcare  Providers:  IncredibleEmployment.be  This test is no t yet approved or cleared by the [place] FDA and  has been authorized for detection and/or diagnosis of SARS-CoV-2 by FDA under an Emergency Use Authorization (EUA). This EUA will remain  in effect (meaning this test can be used) for the duration of the COVID-19 declaration under Section 564(b)(1) of the Act, 21 U.S.C.section 360bbb-3(b)(1), unless the authorization is terminated  or revoked sooner.       Influenza A by PCR NEGATIVE NEGATIVE   Influenza B by PCR NEGATIVE NEGATIVE    Comment: (NOTE) The Xpert Xpress SARS-CoV-2/FLU/RSV plus assay is intended as an aid in the diagnosis of influenza from Nasopharyngeal swab specimens and should not be used as a sole basis for treatment. Nasal washings and aspirates are unacceptable for Xpert Xpress SARS-CoV-2/FLU/RSV testing.  Fact Sheet for Patients: EntrepreneurPulse.com.au  Fact Sheet for Healthcare Providers: IncredibleEmployment.be  This test is not yet approved or cleared by the [place] FDA and has been authorized for detection and/or diagnosis of SARS-CoV-2 by FDA under an Emergency Use Authorization (EUA). This EUA will remain in effect (meaning this test can be used) for the duration of the COVID-19 declaration under Section 564(b)(1) of the Act, 21 U.S.C. section 360bbb-3(b)(1), unless the authorization is terminated or revoked.  Performed at [facility], [place] [address] [place], [place] [address]   Lactic acid, plasma     Status: Abnormal   Collection Time: 01/03/22  3:56 AM  Result Value Ref Range   Lactic Acid, Venous 8.3 (HH) 0.5 - 1.9 mmol/L    Comment: CRITICAL VALUE NOTED.  VALUE IS CONSISTENT WITH PREVIOUSLY REPORTED AND CALLED VALUE. Performed at [facility], [place] [address] [place], [place] [address]   Urinalysis, Routine w reflex microscopic     Status: Abnormal    Collection Time: 01/03/22  3:56 AM  Result Value Ref Range   Color, Urine AMBER (A) YELLOW    Comment: BIOCHEMICALS MAY BE AFFECTED BY COLOR   APPearance HAZY (A) CLEAR   Specific Gravity, Urine 1.017 1.005 - 1.030   pH 5.0 5.0 - 8.0   Glucose, UA NEGATIVE NEGATIVE mg/dL   Hgb urine dipstick LARGE (A) NEGATIVE   Bilirubin Urine NEGATIVE NEGATIVE   Ketones, ur NEGATIVE NEGATIVE mg/dL   Protein, ur 30 (A) NEGATIVE mg/dL   Nitrite NEGATIVE NEGATIVE   Leukocytes,Ua TRACE (A) NEGATIVE   RBC / HPF 11-20 0 - 5 RBC/hpf   WBC, UA 11-20 0 - 5 WBC/hpf   Bacteria, UA MANY (A) NONE SEEN   Mucus PRESENT    Hyaline Casts, UA PRESENT     Comment: Performed at [facility], [address] [place], [place] [address]  Glucose, capillary     Status: Abnormal   Collection Time: 01/03/22  5:30 AM  Result Value Ref Range   Glucose-Capillary 65 (L) 70 - 99 mg/dL    Comment: Glucose reference range applies only to samples taken after fasting for at least 8 hours.  Glucose, capillary     Status: None   Collection Time: 01/03/22  5:35 AM  Result Value Ref Range   Glucose-Capillary 86 70 - 99 mg/dL    Comment: Glucose reference range applies only to samples  taken after fasting for at least 8 hours.  MRSA Next Gen by PCR, Nasal     Status: None   Collection Time: 01/03/22  5:37 AM   Specimen: Nasal Mucosa; Nasal Swab  Result Value Ref Range   MRSA by PCR Next Gen NOT DETECTED NOT DETECTED    Comment: (NOTE) The GeneXpert MRSA Assay (FDA approved for NASAL specimens only), is one component of a comprehensive MRSA colonization surveillance program. It is not intended to diagnose MRSA infection nor to guide or monitor treatment for MRSA infections. Test performance is not FDA approved in patients less than 55 years old. Performed at [facility], [place] [address], [place], [place] [address]   CBC     Status: Abnormal   Collection Time: 01/03/22  6:28 AM  Result Value Ref Range   WBC 15.6  (H) 4.0 - 10.5 K/uL   RBC 3.25 (L) 4.22 - 5.81 MIL/uL   Hemoglobin 9.6 (L) 13.0 - 17.0 g/dL   HCT 28.6 (L) 39.0 - 52.0 %   MCV 88.0 80.0 - 100.0 fL   MCH 29.5 26.0 - 34.0 pg   MCHC 33.6 30.0 - 36.0 g/dL   RDW 14.4 11.5 - 15.5 %   Platelets 117 (L) 150 - 400 K/uL    Comment: Immature Platelet Fraction may be clinically indicated, consider ordering this additional test [id number] REPEATED TO VERIFY PLATELET COUNT CONFIRMED BY SMEAR    nRBC 0.1 0.0 - 0.2 %    Comment: Performed at [facility], [place] [address], [place], [place] [address]  Basic metabolic panel     Status: Abnormal   Collection Time: 01/03/22  6:28 AM  Result Value Ref Range   Sodium 136 135 - 145 mmol/L   Potassium 3.4 (L) 3.5 - 5.1 mmol/L   Chloride 105 98 - 111 mmol/L   CO2 16 (L) 22 - 32 mmol/L   Glucose, Bld 100 (H) 70 - 99 mg/dL    Comment: Glucose reference range applies only to samples taken after fasting for at least 8 hours.   BUN 33 (H) 8 - 23 mg/dL   Creatinine, Ser 3.17 (H) 0.61 - 1.24 mg/dL   Calcium 8.1 (L) 8.9 - 10.3 mg/dL   GFR, Estimated 21 (L) >60 mL/min    Comment: (NOTE) Calculated using the CKD-EPI Creatinine Equation (2021)    Anion gap 15 5 - 15    Comment: Performed at [facility] [address], [place], [place] [address]  Magnesium     Status: Abnormal   Collection Time: 01/03/22  6:28 AM  Result Value Ref Range   Magnesium 1.1 (L) 1.7 - 2.4 mg/dL    Comment: Performed at [facility] [address] [place], [place] [address]  Phosphorus     Status: None   Collection Time: 01/03/22  6:28 AM  Result Value Ref Range   Phosphorus 4.3 2.5 - 4.6 mg/dL    Comment: Performed at [facility] [address] [place], [place] [address]  Glucose, capillary     Status: None   Collection Time: 01/03/22  7:29 AM  Result Value Ref Range   Glucose-Capillary 97 70 - 99 mg/dL    Comment: Glucose reference range applies only to samples taken after fasting for at least 8  hours.  Lactic acid, plasma     Status: Abnormal   Collection Time: 01/03/22  7:31 AM  Result Value Ref Range   Lactic  Acid, Venous 4.2 (HH) 0.5 - 1.9 mmol/L    Comment: CRITICAL VALUE NOTED.  VALUE IS CONSISTENT WITH PREVIOUSLY REPORTED AND CALLED VALUE. Performed at [facility], [place] [address] [place], [place] [address]   Lactic acid, plasma     Status: Abnormal   Collection Time: 01/03/22 10:45 AM  Result Value Ref Range   Lactic Acid, Venous 5.2 (HH) 0.5 - 1.9 mmol/L    Comment: CRITICAL VALUE NOTED.  VALUE IS CONSISTENT WITH PREVIOUSLY REPORTED AND CALLED VALUE. Performed at [facility], [place] [address], [place], [place] [address]   Glucose, capillary     Status: None   Collection Time: 01/03/22 12:11 PM  Result Value Ref Range   Glucose-Capillary 79 70 - 99 mg/dL    Comment: Glucose reference range applies only to samples taken after fasting for at least 8 hours.    CT CHEST ABDOMEN PELVIS WO CONTRAST  Result Date: 01/03/2022 CLINICAL DATA:  Sepsis.  History of rectosigmoid cancer. EXAM: CT CHEST, ABDOMEN AND PELVIS WITHOUT CONTRAST TECHNIQUE: Multidetector CT imaging of the chest, abdomen and pelvis was performed following the standard protocol without IV contrast. RADIATION DOSE REDUCTION: This exam was performed according to the departmental dose-optimization program which includes automated exposure control, adjustment of the mA and/or kV according to patient size and/or use of iterative reconstruction technique. COMPARISON:  CT chest 02/19/2021, MR abdomen 02/16/2021 and CT AP 01/19/2021 FINDINGS: CT CHEST FINDINGS Cardiovascular: Heart size appears normal. No pericardial effusion. Aortic atherosclerosis and coronary artery calcifications. Mediastinum/Nodes: Thyroid gland, trachea, and esophagus demonstrate no significant findings. No enlarged axillary, supraclavicular, or mediastinal lymph nodes. Lungs/Pleura: Centrilobular and paraseptal emphysema. Small bilateral  pleural effusions. Mild bilateral lower lung zone predominant interlobular septal thickening concerning for interstitial edema. There is no airspace consolidation, atelectasis or pneumothorax. Tiny nodule within the left apex measures 4 mm, image 33/7. Not confidently identified on previous exam. Musculoskeletal: No chest wall mass or suspicious bone lesions identified. CT ABDOMEN PELVIS FINDINGS Hepatobiliary: Within the limitations of unenhanced technique there is no focal liver abnormality. Gallbladder appears unremarkable. No bile duct dilatation. Pancreas: Unremarkable. No pancreatic ductal dilatation or surrounding inflammatory changes. Spleen: Normal in size without focal abnormality. Adrenals/Urinary Tract: Normal appearance of the adrenal glands. Normal left kidney. Small stone within inferior pole of the right kidney measures 4 mm, image 202/6. There is right pelvocaliectasis identified, similar to 01/19/2021. There is a stone within the distal right ureter measuring 3 mm, image 69/11. mild circumferential wall thickening involving the urinary bladder with surrounding soft tissue stranding. Stomach/Bowel: Stomach is unremarkable. 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 No pathologic dilatation of the large or small bowel loops to suggest bowel obstruction. Anastomotic suture line is identified within the lower rectum, image 123/4. Vascular/Lymphatic: Aortic atherosclerosis. No signs of abdominopelvic adenopathy. Reproductive: Prostate is unremarkable. Other: Soft tissue stranding and free fluid is noted extending along the right paracolic gutter towards the inferior margin of right lobe of liver, image 86/4. Within the presacral region there is a nonspecific fluid collection measuring 8.1 by 2.5 x 4.8 cm, image 118/4 and image 27/12. This is incompletely characterized without IV contrast. Within the anterior left pelvis there is a fat density area with internal soft tissue stranding with a thin  peripheral soft tissue rim which likely represents an area of fat necrosis, image 111/4. Musculoskeletal: No acute osseous findings. No suspicious bone lesions noted. IMPRESSION: 1. Mild interstitial edema with small bilateral pleural effusions. 2.  Right-sided pelvocaliectasis and right hydroureter secondary to 3 mm distal right ureteral stone. 3. Nonspecific fluid collection within the presacral region. This is incompletely characterized without IV contrast. Although this could represent a benign postoperative fluid collection, abscess is not excluded. 4. Free fluid and fat stranding is noted extending along the right pericolic gutter. 5. Tiny nodule within the left apex is new from previous exam measuring 4 mm. 6. Aortic Atherosclerosis (ICD10-I70.0) and Emphysema (ICD10-J43.9). Electronically Signed   By: Kerby Moors M.D.   On: 01/03/2022 10:31   CT MAXILLOFACIAL WO CONTRAST  Result Date: 01/03/2022 CLINICAL DATA:  Sepsis with unknown source, severe dental caries, evaluate for dental abscess. EXAM: CT MAXILLOFACIAL WITHOUT CONTRAST TECHNIQUE: Multidetector CT imaging of the maxillofacial structures was performed. Multiplanar CT image reconstructions were also generated. RADIATION DOSE REDUCTION: This exam was performed according to the departmental dose-optimization program which includes automated exposure control, adjustment of the mA and/or kV according to patient size and/or use of iterative reconstruction technique. COMPARISON:  None Available. FINDINGS: Osseous: Dental caries with periapical lucency involving left central incisor and remaining premolar in the left mandible. No evidence of adjacent cellulitis. No gross abscess. Orbits: Negative Sinuses: No active sinusitis. Soft tissues: No evidence of acute inflammation. Limited intracranial: Negative IMPRESSION: 1. Dental caries and left mandibular periapical erosions. No evidence of odontogenic soft tissue infection or abscess. 2. Negative for  sinusitis. Electronically Signed   By: Jorje Guild M.D.   On: 01/03/2022 05:44   DG Chest Port 1 View  Result Date: 01/03/2022 CLINICAL DATA:  Sepsis EXAM: PORTABLE CHEST 1 VIEW COMPARISON:  None Available. FINDINGS: The heart size and mediastinal contours are within normal limits. Both lungs are clear. The visualized skeletal structures are unremarkable. Right chest wall Port-A-Cath tip in the lower SVC. IMPRESSION: No active disease. Electronically Signed   By: Ulyses Jarred M.D.   On: 01/03/2022 02:46    Imaging: Personally reviewed  A/P: Avrohom [place] is an 66 y.o. male with  Septic shock Acute kidney injury Renal stone with hydroureter Presacral fluid collection Rectal cancer T4N0 stage IIIc status post open LAR with diverting loop ileostomy at [facility] April 2023; history of preoperative chemoradiation Anemia of chronic disease Hypoalbuminemia Dental caries  Patient still in septic shock.  I do not believe the presacral fluid collection is the source of his sepsis.  I believe he has urosepsis given his positive UA and obstructed right ureter.  There is no air in this presacral fluid collection.  It could just be a postoperative chronic fluid collection.  He has not had any imaging of his abdomen and pelvis since he had surgery at [facility].  Recommend urological intervention.  The patient is still not responding after antibiotic therapy for urosepsis and clearing his ureter could always have IR aspirate the fluid collection for analysis but would recommend holding off on that for now  Data reviewed-high level of medical decision making-I reviewed colorectal notes from [facility], oncology notes, ER records, CT imaging, labs from this admission, labs from his most recent oncology visit from May, Dr. Vonna Kotyk operative note from last summer, unclear prognosis   Leighton Ruff. Redmond Pulling, MD, FACS General, Bariatric, & Minimally Invasive Surgery [facility], [place]

## 2022-01-03 NOTE — Sepsis Progress Note (Addendum)
Elink following for Sepsis Protocol, 1st set of BC drawn on 6/10 @ 2030 and 2BC drawn on 6/11 @ 0250

## 2022-01-03 NOTE — Consult Note (Addendum)
Urology Consult   Physician requesting consult: Dr. Marshell Garfinkel   Reason for consult: Sepsis, Right ureteral stone   History of Present Illness: Curtis [name] is a 66 y.o. 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 He presents with 2 days of chills and lethargy. On presentation to the ED he was found to be in septic shock with lactate of 7.6 and hypotensive to the 02H systolic. He is now on 2 pressors to treat his hypotension. He was subsequently admitted to the ICU. UA shows trace leukocytes, negative nitrites, and many bacteria. CT imaging obtained this morning shows a 3 mmm right distal ureteral stone with mild upstream hydronephrosis. Urology is consulted for further management.  Patient denies prior history of kidney stones. He denies any abdominal pain recently. Has been voiding at his baseline. He has never seen a urologist prior.   He denies a history of voiding or storage urinary symptoms, hematuria, UTIs, STDs, urolithiasis, GU malignancy/trauma/surgery. Last meal was >24 hours ago.   Past Medical History:  Diagnosis Date   Rectosigmoid cancer [facility])     Past Surgical History:  Procedure Laterality Date   BIOPSY  01/21/2021   Procedure: BIOPSY;  Surgeon: Mauri Pole, MD;  Location: [place];  Service: Endoscopy;;   COLON RESECTION N/A 01/21/2021   Procedure: LAPAROSCOPIC ASSISTED LOOP COLOSTOMY;  Surgeon: Donnie Mesa, MD;  Location: [place];  Service: General;  Laterality: N/A;   FLEXIBLE SIGMOIDOSCOPY N/A 01/21/2021   Procedure: FLEXIBLE SIGMOIDOSCOPY;  Surgeon: Mauri Pole, MD;  Location: [place] [facility];  Service: Endoscopy;  Laterality: N/A;   IR IMAGING GUIDED PORT INSERTION  03/04/2021   SUBMUCOSAL TATTOO INJECTION  01/21/2021   Procedure: SUBMUCOSAL TATTOO INJECTION;  Surgeon: Mauri Pole, MD;  Location: [place] [facility];  Service: Endoscopy;;    Current Hospital Medications:  Home Meds:  No  current facility-administered medications on file prior to encounter.   Current Outpatient Medications on File Prior to Encounter  Medication Sig Dispense Refill   acetaminophen (TYLENOL) 500 MG tablet Take 1,000 mg by mouth every 8 (eight) hours as needed for headache or moderate pain.     lidocaine-prilocaine (EMLA) cream Apply 1 application topically as needed. (Patient taking differently: Apply 1 application  topically as needed (prior blood work).) 30 g 0   loperamide (IMODIUM) 2 MG capsule Take 2 mg by mouth as needed for diarrhea or loose stools. Up to 6 /day     KLOR-CON M20 20 MEQ tablet Take 1 tablet by mouth twice daily (Patient not taking: Reported on 01/03/2022) 30 tablet 0   ondansetron (ZOFRAN) 8 MG tablet Take 1 tablet (8 mg total) by mouth every 8 (eight) hours as needed. (Patient not taking: Reported on 08/27/2021) 30 tablet 0   prochlorperazine (COMPAZINE) 10 MG tablet Take 1 tablet (10 mg total) by mouth every 6 (six) hours as needed for nausea or vomiting. (Patient not taking: Reported on 08/27/2021) 30 tablet 0     Scheduled Meds:  Chlorhexidine Gluconate Cloth  6 each Topical Daily   heparin  5,000 Units Subcutaneous Q8H   sodium chloride flush  10-40 mL Intracatheter Q12H   vancomycin variable dose per unstable renal function (pharmacist dosing)   Does not apply See admin instructions   Continuous Infusions:  sodium chloride Stopped (01/03/22 0622)   [START ON 01/04/2022] ceFEPime (MAXIPIME) IV     lactated ringers 125 mL/hr at 01/03/22 1100   metronidazole Stopped (01/03/22 0731)  norepinephrine (LEVOPHED) Adult infusion 22 mcg/min (01/03/22 1100)   vasopressin 0.03 Units/min (01/03/22 1113)   PRN Meds:.sodium chloride, acetaminophen, docusate sodium, ondansetron (ZOFRAN) IV, polyethylene glycol, sodium chloride flush  Allergies:  Allergies  Allergen Reactions   Leucovorin Itching and Rash    Pt. had a reaction during infusion with Oxaliplatin. Complained of  itching "all over". Red, raised rash noted to anterior chest, upper back, and abdominal area. See progress note 06/22/21  Medicated pt. with Benadryl 50 mg IV then Solumedrol '125mg'$  IV. Waited 20 minutes and chemotherapy restarted   Oxaliplatin Itching and Rash    Pt. had a reaction of itching all over and red, raised rash noted to anterior upper chest, upper back, and abdominal area. See progress note 06/22/21 Medicated pt. with Benadryl 50 mg IV then Solumedrol '125mg'$  IV. Waited 20 minutes and chemotherapy restarted    Family History  Problem Relation Age of Onset   Hypertension Mother    Hypertension Father    Breast cancer Sister     Social History:  reports that he has been smoking cigarettes. He has never used smokeless tobacco. He reports that he does not drink alcohol and does not use drugs.  ROS: A complete review of systems was performed.  All systems are negative except for pertinent findings as noted.  Physical Exam:  Vital signs in last 24 hours: Temp:  [99.1 F (37.3 C)-101.7 F (38.7 C)] 99.1 F (37.3 C) (06/11 0730) Pulse Rate:  [87-126] 103 (06/11 1115) Resp:  [12-37] 23 (06/11 1115) BP: (77-146)/(44-113) 99/57 (06/11 1115) SpO2:  [92 %-100 %] 95 % (06/11 1115) Weight:  [84.4 kg-88.1 kg] 88.1 kg (06/11 0532) Constitutional:  Ill appearing, diaphoretic  Cardiovascular: Tachycardic, normal rhythm, No JVD Respiratory: Labored breathing on nasal cannula GI: Abdomen is soft, nontender, nondistended, no abdominal masses GU: No CVA tenderness Lymphatic: No lymphadenopathy Neurologic: Grossly intact, no focal deficits Psychiatric: Normal mood and affect  Laboratory Data:  Recent Labs    01/03/22 0233 01/03/22 0234 01/03/22 0628  WBC 8.4  --  15.6*  HGB 10.6* 10.2* 9.6*  HCT 33.0* 30.0* 28.6*  PLT PLATELET CLUMPS NOTED ON SMEAR, UNABLE TO ESTIMATE  --  117*    Recent Labs    01/03/22 0233 01/03/22 0234 01/03/22 0628  NA 139 139 136  K 3.8 3.8 3.4*  CL  106  --  105  GLUCOSE 87  --  100*  BUN 31*  --  33*  CALCIUM 8.7*  --  8.1*  CREATININE 3.19*  --  3.17*     Results for orders placed or performed during the hospital encounter of 01/03/22 (from the past 24 hour(s))  Comprehensive metabolic panel     Status: Abnormal   Collection Time: 01/03/22  2:33 AM  Result Value Ref Range   Sodium 139 135 - 145 mmol/L   Potassium 3.8 3.5 - 5.1 mmol/L   Chloride 106 98 - 111 mmol/L   CO2 15 (L) 22 - 32 mmol/L   Glucose, Bld 87 70 - 99 mg/dL   BUN 31 (H) 8 - 23 mg/dL   Creatinine, Ser 3.19 (H) 0.61 - 1.24 mg/dL   Calcium 8.7 (L) 8.9 - 10.3 mg/dL   Total Protein 5.6 (L) 6.5 - 8.1 g/dL   Albumin 2.8 (L) 3.5 - 5.0 g/dL   AST 32 15 - 41 U/L   ALT 22 0 - 44 U/L   Alkaline Phosphatase 111 38 - 126 U/L   Total Bilirubin  1.0 0.3 - 1.2 mg/dL   GFR, Estimated 21 (L) >60 mL/min   Anion gap 18 (H) 5 - 15  CBC with Differential     Status: Abnormal   Collection Time: 01/03/22  2:33 AM  Result Value Ref Range   WBC 8.4 4.0 - 10.5 K/uL   RBC 3.60 (L) 4.22 - 5.81 MIL/uL   Hemoglobin 10.6 (L) 13.0 - 17.0 g/dL   HCT 33.0 (L) 39.0 - 52.0 %   MCV 91.7 80.0 - 100.0 fL   MCH 29.4 26.0 - 34.0 pg   MCHC 32.1 30.0 - 36.0 g/dL   RDW 14.2 11.5 - 15.5 %   Platelets PLATELET CLUMPS NOTED ON SMEAR, UNABLE TO ESTIMATE 150 - 400 K/uL   nRBC 0.5 (H) 0.0 - 0.2 %   Neutrophils Relative % 83 %   Neutro Abs 7.0 1.7 - 7.7 K/uL   Lymphocytes Relative 3 %   Lymphs Abs 0.3 (L) 0.7 - 4.0 K/uL   Monocytes Relative 1 %   Monocytes Absolute 0.1 0.1 - 1.0 K/uL   Eosinophils Relative 0 %   Eosinophils Absolute 0.0 0.0 - 0.5 K/uL   Basophils Relative 1 %   Basophils Absolute 0.1 0.0 - 0.1 K/uL   RBC Morphology MORPHOLOGY UNREMARKABLE    nRBC 3 (H) 0 /100 WBC   Metamyelocytes Relative 9 %   Myelocytes 3 %   Abs Immature Granulocytes 1.00 (H) 0.00 - 0.07 K/uL  Protime-INR     Status: Abnormal   Collection Time: 01/03/22  2:33 AM  Result Value Ref Range   Prothrombin  Time 16.8 (H) 11.4 - 15.2 seconds   INR 1.4 (H) 0.8 - 1.2  APTT     Status: None   Collection Time: 01/03/22  2:33 AM  Result Value Ref Range   aPTT 35 24 - 36 seconds  I-Stat venous blood gas, ([facility] ED only)     Status: Abnormal   Collection Time: 01/03/22  2:34 AM  Result Value Ref Range   pH, Ven 7.269 7.25 - 7.43   pCO2, Ven 30.6 (L) 44 - 60 mmHg   pO2, Ven 39 32 - 45 mmHg   Bicarbonate 14.0 (L) 20.0 - 28.0 mmol/L   TCO2 15 (L) 22 - 32 mmol/L   O2 Saturation 67 %   Acid-base deficit 12.0 (H) 0.0 - 2.0 mmol/L   Sodium 139 135 - 145 mmol/L   Potassium 3.8 3.5 - 5.1 mmol/L   Calcium, Ion 1.12 (L) 1.15 - 1.40 mmol/L   HCT 30.0 (L) 39.0 - 52.0 %   Hemoglobin 10.2 (L) 13.0 - 17.0 g/dL   Sample type VENOUS    Comment NOTIFIED PHYSICIAN   Lactic acid, plasma     Status: Abnormal   Collection Time: 01/03/22  2:35 AM  Result Value Ref Range   Lactic Acid, Venous 7.6 (HH) 0.5 - 1.9 mmol/L  CBG monitoring, ED     Status: None   Collection Time: 01/03/22  2:37 AM  Result Value Ref Range   Glucose-Capillary 81 70 - 99 mg/dL   Comment 1 Notify RN    Comment 2 Document in Chart   Resp Panel by RT-PCR (Flu A&B, Covid) Anterior Nasal Swab     Status: None   Collection Time: 01/03/22  2:40 AM   Specimen: Anterior Nasal Swab  Result Value Ref Range   SARS Coronavirus 2 by RT PCR NEGATIVE NEGATIVE   Influenza A by PCR NEGATIVE NEGATIVE   Influenza  B by PCR NEGATIVE NEGATIVE  Lactic acid, plasma     Status: Abnormal   Collection Time: 01/03/22  3:56 AM  Result Value Ref Range   Lactic Acid, Venous 8.3 (HH) 0.5 - 1.9 mmol/L  Urinalysis, Routine w reflex microscopic     Status: Abnormal   Collection Time: 01/03/22  3:56 AM  Result Value Ref Range   Color, Urine AMBER (A) YELLOW   APPearance HAZY (A) CLEAR   Specific Gravity, Urine 1.017 1.005 - 1.030   pH 5.0 5.0 - 8.0   Glucose, UA NEGATIVE NEGATIVE mg/dL   Hgb urine dipstick LARGE (A) NEGATIVE   Bilirubin Urine NEGATIVE NEGATIVE    Ketones, ur NEGATIVE NEGATIVE mg/dL   Protein, ur 30 (A) NEGATIVE mg/dL   Nitrite NEGATIVE NEGATIVE   Leukocytes,Ua TRACE (A) NEGATIVE   RBC / HPF 11-20 0 - 5 RBC/hpf   WBC, UA 11-20 0 - 5 WBC/hpf   Bacteria, UA MANY (A) NONE SEEN   Mucus PRESENT    Hyaline Casts, UA PRESENT   Glucose, capillary     Status: Abnormal   Collection Time: 01/03/22  5:30 AM  Result Value Ref Range   Glucose-Capillary 65 (L) 70 - 99 mg/dL  Glucose, capillary     Status: None   Collection Time: 01/03/22  5:35 AM  Result Value Ref Range   Glucose-Capillary 86 70 - 99 mg/dL  MRSA Next Gen by PCR, Nasal     Status: None   Collection Time: 01/03/22  5:37 AM   Specimen: Nasal Mucosa; Nasal Swab  Result Value Ref Range   MRSA by PCR Next Gen NOT DETECTED NOT DETECTED  CBC     Status: Abnormal   Collection Time: 01/03/22  6:28 AM  Result Value Ref Range   WBC 15.6 (H) 4.0 - 10.5 K/uL   RBC 3.25 (L) 4.22 - 5.81 MIL/uL   Hemoglobin 9.6 (L) 13.0 - 17.0 g/dL   HCT 28.6 (L) 39.0 - 52.0 %   MCV 88.0 80.0 - 100.0 fL   MCH 29.5 26.0 - 34.0 pg   MCHC 33.6 30.0 - 36.0 g/dL   RDW 14.4 11.5 - 15.5 %   Platelets 117 (L) 150 - 400 K/uL   nRBC 0.1 0.0 - 0.2 %  Basic metabolic panel     Status: Abnormal   Collection Time: 01/03/22  6:28 AM  Result Value Ref Range   Sodium 136 135 - 145 mmol/L   Potassium 3.4 (L) 3.5 - 5.1 mmol/L   Chloride 105 98 - 111 mmol/L   CO2 16 (L) 22 - 32 mmol/L   Glucose, Bld 100 (H) 70 - 99 mg/dL   BUN 33 (H) 8 - 23 mg/dL   Creatinine, Ser 3.17 (H) 0.61 - 1.24 mg/dL   Calcium 8.1 (L) 8.9 - 10.3 mg/dL   GFR, Estimated 21 (L) >60 mL/min   Anion gap 15 5 - 15  Magnesium     Status: Abnormal   Collection Time: 01/03/22  6:28 AM  Result Value Ref Range   Magnesium 1.1 (L) 1.7 - 2.4 mg/dL  Phosphorus     Status: None   Collection Time: 01/03/22  6:28 AM  Result Value Ref Range   Phosphorus 4.3 2.5 - 4.6 mg/dL  Glucose, capillary     Status: None   Collection Time: 01/03/22  7:29 AM   Result Value Ref Range   Glucose-Capillary 97 70 - 99 mg/dL  Lactic acid, plasma  Status: Abnormal   Collection Time: 01/03/22  7:31 AM  Result Value Ref Range   Lactic Acid, Venous 4.2 (HH) 0.5 - 1.9 mmol/L  Lactic acid, plasma     Status: Abnormal   Collection Time: 01/03/22 10:45 AM  Result Value Ref Range   Lactic Acid, Venous 5.2 (HH) 0.5 - 1.9 mmol/L   Recent Results (from the past 240 hour(s))  Resp Panel by RT-PCR (Flu A&B, Covid) Anterior Nasal Swab     Status: None   Collection Time: 01/03/22  2:40 AM   Specimen: Anterior Nasal Swab  Result Value Ref Range Status   SARS Coronavirus 2 by RT PCR NEGATIVE NEGATIVE Final    Comment: (NOTE) SARS-CoV-2 target nucleic acids are NOT DETECTED.  The SARS-CoV-2 RNA is generally detectable in upper respiratory specimens during the acute phase of infection. The lowest concentration of SARS-CoV-2 viral copies this assay can detect is 138 copies/mL. A negative result does not preclude SARS-Cov-2 infection and should not be used as the sole basis for treatment or other patient management decisions. A negative result may occur with  improper specimen collection/handling, submission of specimen other than nasopharyngeal swab, presence of viral mutation(s) within the areas targeted by this assay, and inadequate number of viral copies(<138 copies/mL). A negative result must be combined with clinical observations, patient history, and epidemiological information. The expected result is Negative.  Fact Sheet for Patients:  EntrepreneurPulse.com.au  Fact Sheet for Healthcare Providers:  IncredibleEmployment.be  This test is no t yet approved or cleared by the [place] FDA and  has been authorized for detection and/or diagnosis of SARS-CoV-2 by FDA under an Emergency Use Authorization (EUA). This EUA will remain  in effect (meaning this test can be used) for the duration of the COVID-19  declaration under Section 564(b)(1) of the Act, 21 U.S.C.section 360bbb-3(b)(1), unless the authorization is terminated  or revoked sooner.       Influenza A by PCR NEGATIVE NEGATIVE Final   Influenza B by PCR NEGATIVE NEGATIVE Final    Comment: (NOTE) The Xpert Xpress SARS-CoV-2/FLU/RSV plus assay is intended as an aid in the diagnosis of influenza from Nasopharyngeal swab specimens and should not be used as a sole basis for treatment. Nasal washings and aspirates are unacceptable for Xpert Xpress SARS-CoV-2/FLU/RSV testing.  Fact Sheet for Patients: EntrepreneurPulse.com.au  Fact Sheet for Healthcare Providers: IncredibleEmployment.be  This test is not yet approved or cleared by the [place] FDA and has been authorized for detection and/or diagnosis of SARS-CoV-2 by FDA under an Emergency Use Authorization (EUA). This EUA will remain in effect (meaning this test can be used) for the duration of the COVID-19 declaration under Section 564(b)(1) of the Act, 21 U.S.C. section 360bbb-3(b)(1), unless the authorization is terminated or revoked.  Performed at [facility], [place] [address] [place], [place] [address]   MRSA Next Gen by PCR, Nasal     Status: None   Collection Time: 01/03/22  5:37 AM   Specimen: Nasal Mucosa; Nasal Swab  Result Value Ref Range Status   MRSA by PCR Next Gen NOT DETECTED NOT DETECTED Final    Comment: (NOTE) The GeneXpert MRSA Assay (FDA approved for NASAL specimens only), is one component of a comprehensive MRSA colonization surveillance program. It is not intended to diagnose MRSA infection nor to guide or monitor treatment for MRSA infections. Test performance is not FDA approved in patients less than 22 years old. Performed at [facility], [place]  [address], [place], [place] [address]     Renal Function: Recent Labs    01/03/22 0233 01/03/22 9833  CREATININE 3.19* 3.17*   Estimated  Creatinine Clearance: 26.3 mL/min (A) (by C-G formula based on SCr of 3.17 mg/dL (H)).  Radiologic Imaging: CT CHEST ABDOMEN PELVIS WO CONTRAST  Result Date: 01/03/2022 CLINICAL DATA:  Sepsis.  History of rectosigmoid cancer. EXAM: CT CHEST, ABDOMEN AND PELVIS WITHOUT CONTRAST TECHNIQUE: Multidetector CT imaging of the chest, abdomen and pelvis was performed following the standard protocol without IV contrast. RADIATION DOSE REDUCTION: This exam was performed according to the departmental dose-optimization program which includes automated exposure control, adjustment of the mA and/or kV according to patient size and/or use of iterative reconstruction technique. COMPARISON:  CT chest 02/19/2021, MR abdomen 02/16/2021 and CT AP 01/19/2021 FINDINGS: CT CHEST FINDINGS Cardiovascular: Heart size appears normal. No pericardial effusion. Aortic atherosclerosis and coronary artery calcifications. Mediastinum/Nodes: Thyroid gland, trachea, and esophagus demonstrate no significant findings. No enlarged axillary, supraclavicular, or mediastinal lymph nodes. Lungs/Pleura: Centrilobular and paraseptal emphysema. Small bilateral pleural effusions. Mild bilateral lower lung zone predominant interlobular septal thickening concerning for interstitial edema. There is no airspace consolidation, atelectasis or pneumothorax. Tiny nodule within the left apex measures 4 mm, image 33/7. Not confidently identified on previous exam. Musculoskeletal: No chest wall mass or suspicious bone lesions identified. CT ABDOMEN PELVIS FINDINGS Hepatobiliary: Within the limitations of unenhanced technique there is no focal liver abnormality. Gallbladder appears unremarkable. No bile duct dilatation. Pancreas: Unremarkable. No pancreatic ductal dilatation or surrounding inflammatory changes. Spleen: Normal in size without focal abnormality. Adrenals/Urinary Tract: Normal appearance of the adrenal glands. Normal left kidney. Small stone within  inferior pole of the right kidney measures 4 mm, image 202/6. There is right pelvocaliectasis identified, similar to 01/19/2021. There is a stone within the distal right ureter measuring 3 mm, image 69/11. mild circumferential wall thickening involving the urinary bladder with surrounding soft tissue stranding. Stomach/Bowel: Stomach is unremarkable. 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 No pathologic dilatation of the large or small bowel loops to suggest bowel obstruction. Anastomotic suture line is identified within the lower rectum, image 123/4. Vascular/Lymphatic: Aortic atherosclerosis. No signs of abdominopelvic adenopathy. Reproductive: Prostate is unremarkable. Other: Soft tissue stranding and free fluid is noted extending along the right paracolic gutter towards the inferior margin of right lobe of liver, image 86/4. Within the presacral region there is a nonspecific fluid collection measuring 8.1 by 2.5 x 4.8 cm, image 118/4 and image 27/12. This is incompletely characterized without IV contrast. Within the anterior left pelvis there is a fat density area with internal soft tissue stranding with a thin peripheral soft tissue rim which likely represents an area of fat necrosis, image 111/4. Musculoskeletal: No acute osseous findings. No suspicious bone lesions noted. IMPRESSION: 1. Mild interstitial edema with small bilateral pleural effusions. 2. Right-sided pelvocaliectasis and right hydroureter secondary to 3 mm distal right ureteral stone. 3. Nonspecific fluid collection within the presacral region. This is incompletely characterized without IV contrast. Although this could represent a benign postoperative fluid collection, abscess is not excluded. 4. Free fluid and fat stranding is noted extending along the right pericolic gutter. 5. Tiny nodule within the left apex is new from previous exam measuring 4 mm. 6. Aortic Atherosclerosis (ICD10-I70.0) and Emphysema (ICD10-J43.9). Electronically  Signed   By: Kerby Moors M.D.   On: 01/03/2022 10:31   CT MAXILLOFACIAL WO CONTRAST  Result Date: 01/03/2022 CLINICAL DATA:  Sepsis with unknown source, severe  dental caries, evaluate for dental abscess. EXAM: CT MAXILLOFACIAL WITHOUT CONTRAST TECHNIQUE: Multidetector CT imaging of the maxillofacial structures was performed. Multiplanar CT image reconstructions were also generated. RADIATION DOSE REDUCTION: This exam was performed according to the departmental dose-optimization program which includes automated exposure control, adjustment of the mA and/or kV according to patient size and/or use of iterative reconstruction technique. COMPARISON:  None Available. FINDINGS: Osseous: Dental caries with periapical lucency involving left central incisor and remaining premolar in the left mandible. No evidence of adjacent cellulitis. No gross abscess. Orbits: Negative Sinuses: No active sinusitis. Soft tissues: No evidence of acute inflammation. Limited intracranial: Negative IMPRESSION: 1. Dental caries and left mandibular periapical erosions. No evidence of odontogenic soft tissue infection or abscess. 2. Negative for sinusitis. Electronically Signed   By: Jorje Guild M.D.   On: 01/03/2022 05:44   DG Chest Port 1 View  Result Date: 01/03/2022 CLINICAL DATA:  Sepsis EXAM: PORTABLE CHEST 1 VIEW COMPARISON:  None Available. FINDINGS: The heart size and mediastinal contours are within normal limits. Both lungs are clear. The visualized skeletal structures are unremarkable. Right chest wall Port-A-Cath tip in the lower SVC. IMPRESSION: No active disease. Electronically Signed   By: Ulyses Jarred M.D.   On: 01/03/2022 02:46    I independently reviewed the above imaging studies.    Impression/Recommendation 66 y/o male in 2 pressor septic shock with CT imaging showing 69m right distal ureteral stone and upstream mild hydronephrosis.   We discussed the indications for acute intervention including infected  obstruction, bilateral ureteral obstruction or unilateral obstruction of solitary kidney as well as other less urgent indications for decompression which would included intractable pain, N/V, and acute renal injury. We also discussed the possible inability to place a ureteral stent in which case, the next intervention recommended would be a percutaneous nephrostomy tube. Given infected obstruction is present, plan for emergent right ureteral stent placement.  -Proceed emergently to the OR for right ureteral stent placement.  -Consent obtained from patient at bedside. At time of evaluation he is A&O x 4. Risks and benefits discussed. Also discussed possibility of remaining intubated post procedurally given his profound hypotension  -He is on Vanc/Flagyl/Cefepime for broad coverage -Will place foley after procedure  -He will ultimately need outpatient ureteroscopy and laser lithotripsy in 2-3 weeks for stone removal once he has recovered from his current infection. Stent to stay in place until then.  -Attempted to call patient's son several times with no answer. EDaiwik [phone]  KAldine Contes6/05/2022, 12:07 PM  Addendum:I performed a history and physical examination of the patient and discussed the patients management with the resident.  I reviewed the resident's note and agree with the documented findings and plan of care.

## 2022-01-03 NOTE — Anesthesia Procedure Notes (Signed)
Procedure Name: Intubation Date/Time: 01/03/2022 1:35 PM  Performed by: Eligha Bridegroom, CRNAPre-anesthesia Checklist: Patient identified, Emergency Drugs available, Suction available, Patient being monitored and Timeout performed Patient Re-evaluated:Patient Re-evaluated prior to induction Oxygen Delivery Method: Circle system utilized Preoxygenation: Pre-oxygenation with 100% oxygen Induction Type: IV induction and Rapid sequence Laryngoscope Size: Glidescope Grade View: Grade II Tube type: Oral Tube size: 7.5 mm Airway Equipment and Method: Stylet Placement Confirmation: ETT inserted through vocal cords under direct vision, positive ETCO2 and breath sounds checked- equal and bilateral Secured at: 22 cm Tube secured with: Tape Dental Injury: Teeth and Oropharynx as per pre-operative assessment

## 2022-01-03 NOTE — H&P (Signed)
NAMEZeferino [name], MRN:  [id number], DOB:  May 26, 1956, LOS: 0 ADMISSION DATE:  01/03/2022, CONSULTATION DATE: 01/03/2022 REFERRING MD: Sedonia Small- EDP, CHIEF COMPLAINT: Septic shock  History of Present Illness:  Curtis [name] is a 66 year old gentleman with a history of rectosigmoid cancer status post resection who completed chemotherapy in December 2022 who presents with 2 days of chills and vomiting.  He did not have a fever at home.  He denies abdominal pain, change in stool/ostomy output, cough, sputum production, significant upper respiratory symptoms, wounds, new rashes.  He has chronic skin breakdown that is unchanged around his ostomy site.  He has a history of dental caries that has been more significant since chemotherapy.  He has not seen a dentist recently.  No recent dental changes. Per his son he has been breathing heavily and has been drifting in and out of sleep.  Upon arrival to the ED he was febrile to 101.7.  He was hypotensive requiring initiation of norepinephrine.  CVC placed in the ED.  Pertinent  Medical History  Rectal cancer, status post resection and chemotherapy  Significant Hospital Events: Including procedures, antibiotic start and stop dates in addition to other pertinent events   6/11 admission-started on nor epi, cefepime, Vanco in the ED.  Interim History / Subjective:    Objective   Blood pressure (!) 88/48, pulse 97, temperature 99.4 F (37.4 C), temperature source Rectal, resp. rate (!) 28, height '5\' 8"'$  (1.727 m), weight 84.4 kg, SpO2 100 %.        Intake/Output Summary (Last 24 hours) at 01/03/2022 0435 Last data filed at 01/03/2022 0307 Gross per 24 hour  Intake 1100 ml  Output --  Net 1100 ml   Filed Weights   01/03/22 0219  Weight: 84.4 kg    Examination: General: Critically ill-appearing man lying in bed in no acute distress, fatigued appearing HENT: [place]/AT, oral mucosa dry.  Multiple missing teeth, severe caries. Lungs: Breathing comfortably on  nasal cannula, CTA B Cardiovascular: S1 S2, regular rate and rhythm Abdomen: Soft, nontender, nondistended.  Ostomy with light brown stool output.  Mild erythema around ostomy site with no areas of induration or warmth Extremities: No peripheral edema, no clubbing or cyanosis.  Right femoral CVC. Neuro: Awake and alert, answers questions appropriately.  Globally weak, but no focal deficits. GU: Foley in place Derm: No diffuse rashes, skin tenting and slow cap refill  Bicarb 15 BUN 31 Creatinine 3.19 Lactic acid 7.6 WBC 8.4 H/H 10.6/33 INR 1.4 COVID and flu negative CXR personally reviewed-Port-A-Cath on the right, retrocardiac aorta silhouetted, but no focal opacities or air bronchograms. EKG-normal sinus rhythm, normal intervals, no ischemic changes  Resolved Hospital Problem list     Assessment & Plan:  Septic shock of unknown source.  Physical exam unrevealing other than significant dental caries. - Blood cultures pending - Continue cefepime vancomycin, adding metronidazole - CT maxillofacial, unfortunately has to be done without contrast due to AKI - Serial lactates -Additional volume resuscitation, previously received 2.5 L bolus per sepsis protocol  Lactic acidosis - Serial lactates - Additional 2 L volume resuscitation  Nausea and vomiting - Clear liquid diet - Zofran  AKI due to sepsis - Additional 2 L volume resuscitation - Maintain adequate renal perfusion - Renally dose meds and avoid nephrotoxic meds - Strict I's/O - Continue to monitor renal function, if no recovery will need renal ultrasound and additional work-up.  Hypocalcemia - Repleted  History of rectosigmoid cancer with ostomy bag - Routine  ostomy care  Surrogate decision maker-son Curtis [name].  Curtis [name] updated over the phone this morning.  Best Practice (right click and "Reselect all SmartList Selections" daily)   Diet/type: clear liquids DVT prophylaxis: prophylactic heparin  GI prophylaxis:  N/A Lines: Central line-femoral line, has port a catheter indwelling, not accessed Foley:  Yes, and it is still needed Code Status:  full code Last date of multidisciplinary goals of care discussion '[ ]'$   Labs   CBC: Recent Labs  Lab 01/03/22 0233 01/03/22 0234  WBC 8.4  --   NEUTROABS 7.0  --   HGB 10.6* 10.2*  HCT 33.0* 30.0*  MCV 91.7  --   PLT PLATELET CLUMPS NOTED ON SMEAR, UNABLE TO ESTIMATE  --     Basic Metabolic Panel: Recent Labs  Lab 01/03/22 0233 01/03/22 0234  NA 139 139  K 3.8 3.8  CL 106  --   CO2 15*  --   GLUCOSE 87  --   BUN 31*  --   CREATININE 3.19*  --   CALCIUM 8.7*  --    GFR: Estimated Creatinine Clearance: 24.8 mL/min (A) (by C-G formula based on SCr of 3.19 mg/dL (H)). Recent Labs  Lab 01/03/22 0233 01/03/22 0235  WBC 8.4  --   LATICACIDVEN  --  7.6*    Liver Function Tests: Recent Labs  Lab 01/03/22 0233  AST 32  ALT 22  ALKPHOS 111  BILITOT 1.0  PROT 5.6*  ALBUMIN 2.8*   No results for input(s): "LIPASE", "AMYLASE" in the last 168 hours. No results for input(s): "AMMONIA" in the last 168 hours.  ABG    Component Value Date/Time   HCO3 14.0 (L) 01/03/2022 0234   TCO2 15 (L) 01/03/2022 0234   ACIDBASEDEF 12.0 (H) 01/03/2022 0234   O2SAT 67 01/03/2022 0234     Coagulation Profile: Recent Labs  Lab 01/03/22 0233  INR 1.4*    Cardiac Enzymes: No results for input(s): "CKTOTAL", "CKMB", "CKMBINDEX", "TROPONINI" in the last 168 hours.  HbA1C: No results found for: "HGBA1C"  CBG: Recent Labs  Lab 01/03/22 0237  GLUCAP 81    Review of Systems:   Review of Systems  Constitutional:  Positive for chills, fever and malaise/fatigue.  HENT:  Negative for congestion and sore throat.   Respiratory:  Negative for cough, sputum production and shortness of breath.   Cardiovascular:  Negative for chest pain and leg swelling.  Gastrointestinal:  Positive for nausea and vomiting. Negative for blood in stool.   Genitourinary: Negative.   Skin:  Negative for rash.  Neurological:  Positive for sensory change.     Past Medical History:  He,  has a past medical history of Rectosigmoid cancer ([place]).   Surgical History:   Past Surgical History:  Procedure Laterality Date   BIOPSY  01/21/2021   Procedure: BIOPSY;  Surgeon: Mauri Pole, MD;  Location: [facility];  Service: Endoscopy;;   COLON RESECTION N/A 01/21/2021   Procedure: LAPAROSCOPIC ASSISTED LOOP COLOSTOMY;  Surgeon: Donnie Mesa, MD;  Location: [place];  Service: General;  Laterality: N/A;   FLEXIBLE SIGMOIDOSCOPY N/A 01/21/2021   Procedure: FLEXIBLE SIGMOIDOSCOPY;  Surgeon: Mauri Pole, MD;  Location: [facility];  Service: Endoscopy;  Laterality: N/A;   IR IMAGING GUIDED PORT INSERTION  03/04/2021   SUBMUCOSAL TATTOO INJECTION  01/21/2021   Procedure: SUBMUCOSAL TATTOO INJECTION;  Surgeon: Mauri Pole, MD;  Location: [facility];  Service: Endoscopy;;     Social History:   reports that  he has been smoking cigarettes. He has never used smokeless tobacco. He reports that he does not drink alcohol and does not use drugs.   Family History:  His family history includes Breast cancer in his sister; Hypertension in his father and mother.   Allergies Allergies  Allergen Reactions   Leucovorin Itching and Rash    Pt. had a reaction during infusion with Oxaliplatin. Complained of itching "all over". Red, raised rash noted to anterior chest, upper back, and abdominal area. See progress note 06/22/21  Medicated pt. with Benadryl 50 mg IV then Solumedrol '125mg'$  IV. Waited 20 minutes and chemotherapy restarted   Oxaliplatin Itching and Rash    Pt. had a reaction of itching all over and red, raised rash noted to anterior upper chest, upper back, and abdominal area. See progress note 06/22/21 Medicated pt. with Benadryl 50 mg IV then Solumedrol '125mg'$  IV. Waited 20 minutes and chemotherapy restarted     Home Medications   Prior to Admission medications   Medication Sig Start Date End Date Taking? Authorizing Provider  acetaminophen (TYLENOL) 500 MG tablet Take 1,000 mg by mouth every 8 (eight) hours as needed for headache or moderate pain.   Yes [provider]  lidocaine-prilocaine (EMLA) cream Apply 1 application topically as needed. Patient taking differently: Apply 1 application  topically as needed (prior blood work). 07/08/21  Yes Hayden Pedro, PA-C  loperamide (IMODIUM) 2 MG capsule Take 2 mg by mouth as needed for diarrhea or loose stools. Up to 6 /day   Yes [provider]  KLOR-CON M20 20 MEQ tablet Take 1 tablet by mouth twice daily Patient not taking: Reported on 01/03/2022 08/19/21   Orson Slick, MD  ondansetron (ZOFRAN) 8 MG tablet Take 1 tablet (8 mg total) by mouth every 8 (eight) hours as needed. Patient not taking: Reported on 08/27/2021 02/26/21   Orson Slick, MD  prochlorperazine (COMPAZINE) 10 MG tablet Take 1 tablet (10 mg total) by mouth every 6 (six) hours as needed for nausea or vomiting. Patient not taking: Reported on 08/27/2021 02/26/21   Orson Slick, MD     Critical care time: 50 min.    Julian Hy, DO 01/03/22 4:35 AM [facility]

## 2022-01-03 NOTE — Anesthesia Postprocedure Evaluation (Addendum)
Anesthesia Post Note  Patient: Curtis [name]  Procedure(s) Performed: CYSTOSCOPY WITH  URETERAL STENT PLACEMENT (Right: Ureter)     Patient location during evaluation: ICU Anesthesia Type: General Level of consciousness: sedated and patient remains intubated per anesthesia plan Pain management: pain level controlled Respiratory status: patient on ventilator - see flowsheet for VS and patient remains intubated per anesthesia plan Cardiovascular status: unstable Postop Assessment: no apparent nausea or vomiting Anesthetic complications: no Comments: Report to ICU MD   No notable events documented.  Last Vitals:  Vitals:   01/03/22 1245 01/03/22 1300  BP: 107/62 107/60  Pulse: (!) 102 (!) 104  Resp: (!) 28 (!) 25  Temp:    SpO2: 90% 92%    Last Pain:  Vitals:   01/03/22 0852  TempSrc:   PainSc: Asleep                 Benjimen Kelley,E. Silas Sedam

## 2022-01-03 NOTE — Progress Notes (Addendum)
eLink Physician-Brief Progress Note Patient Name: Curtis [name] DOB: January 08, 1956 MRN: [id number]   Date of Service  01/03/2022  HPI/Events of Note  E link was asked to follow up on 8 pm lactate. It is 5.7. Seen on camera. On 5 mic levophed and on 0.04 of vasopressin. SBP is good in 130s. Pressors being weaned. On 40% fio2. Not making much urine but no signs of fluid overload. Has a foley and sedated on propofol.   eICU Interventions  Is fairly positive in terms of fluid balance but will attempt a small bolus since no fluid overload on signs  500 cc LR over 1 hour Asked RN to send another LA with ABG at 1 am, along with CBC and BMP so we have AM labs then      Intervention Category Major Interventions: Sepsis - evaluation and management  Margaretmary Lombard 01/03/2022, 9:23 PM  Addendum at 4:30 am LA improved on last check  On 4 mic levo and vaso with improved Bps  Continue to trend, recheck ordered for 7 am

## 2022-01-03 NOTE — Progress Notes (Addendum)
eLink Physician-Brief Progress Note Patient Name: Curtis [name] DOB: June 11, 1956 MRN: [id number]   Date of Service  01/03/2022  HPI/Events of Note  Patient with septic shock of as yet unclear etiology, serum lactic acid increased from 7.6 to 8.3 despite 2.5 liters of crystalloid + antibiotics, patient has a history of rectal cancer s/p resection with colostomy. CT maxillofacial pending. Serum albumin 2.8 gm / dl. INR  4.5.  eICU Interventions  New Patient Evaluation. Will order a 500 ml iv  5 %  albumin bolus x 1, will request CT abdomen / pelvis for completeness, continue  Vancomycin + Cefepime + Flagyl.         Kerry Kass Beuna Bolding 01/03/2022, 5:43 AM

## 2022-01-03 NOTE — Progress Notes (Signed)
PHARMACY - PHYSICIAN COMMUNICATION CRITICAL VALUE ALERT - BLOOD CULTURE IDENTIFICATION (BCID)  Curtis [name] is an 66 y.o. male who presented to [facility] on 01/03/2022 with septic shock with R ureteral stone  s/p cystoscopy on 6/11.  Assessment:  1 of 4 bottles growing E.coli, no resistance.  Name of physician (or Provider) Contacted: Dr. Vaughan Browner  Current antibiotics: vancomycin and cefepime  Changes to prescribed antibiotics recommended:  Recommendations declined by provider due to clinical deterioration.  F/U narrow as able.  Results for orders placed or performed during the hospital encounter of 01/03/22  Blood Culture ID Panel (Reflexed) (Collected: 01/03/2022  2:35 AM)  Result Value Ref Range   Enterococcus faecalis NOT DETECTED NOT DETECTED   Enterococcus Faecium NOT DETECTED NOT DETECTED   Listeria monocytogenes NOT DETECTED NOT DETECTED   Staphylococcus species NOT DETECTED NOT DETECTED   Staphylococcus aureus (BCID) NOT DETECTED NOT DETECTED   Staphylococcus epidermidis NOT DETECTED NOT DETECTED   Staphylococcus lugdunensis NOT DETECTED NOT DETECTED   Streptococcus species NOT DETECTED NOT DETECTED   Streptococcus agalactiae NOT DETECTED NOT DETECTED   Streptococcus pneumoniae NOT DETECTED NOT DETECTED   Streptococcus pyogenes NOT DETECTED NOT DETECTED   A.calcoaceticus-baumannii NOT DETECTED NOT DETECTED   Bacteroides fragilis NOT DETECTED NOT DETECTED   Enterobacterales DETECTED (A) NOT DETECTED   Enterobacter cloacae complex NOT DETECTED NOT DETECTED   Escherichia coli DETECTED (A) NOT DETECTED   Klebsiella aerogenes NOT DETECTED NOT DETECTED   Klebsiella oxytoca NOT DETECTED NOT DETECTED   Klebsiella pneumoniae NOT DETECTED NOT DETECTED   Proteus species NOT DETECTED NOT DETECTED   Salmonella species NOT DETECTED NOT DETECTED   Serratia marcescens NOT DETECTED NOT DETECTED   Haemophilus influenzae NOT DETECTED NOT DETECTED   Neisseria meningitidis NOT DETECTED NOT  DETECTED   Pseudomonas aeruginosa NOT DETECTED NOT DETECTED   Stenotrophomonas maltophilia NOT DETECTED NOT DETECTED   Candida albicans NOT DETECTED NOT DETECTED   Candida auris NOT DETECTED NOT DETECTED   Candida glabrata NOT DETECTED NOT DETECTED   Candida krusei NOT DETECTED NOT DETECTED   Candida parapsilosis NOT DETECTED NOT DETECTED   Candida tropicalis NOT DETECTED NOT DETECTED   Cryptococcus neoformans/gattii NOT DETECTED NOT DETECTED   CTX-M ESBL NOT DETECTED NOT DETECTED   Carbapenem resistance IMP NOT DETECTED NOT DETECTED   Carbapenem resistance KPC NOT DETECTED NOT DETECTED   Carbapenem resistance NDM NOT DETECTED NOT DETECTED   Carbapenem resist OXA 48 LIKE NOT DETECTED NOT DETECTED   Carbapenem resistance VIM NOT DETECTED NOT DETECTED    Maeley Matton D. Mina Marble, PharmD, BCPS, [place] 01/03/2022, 6:16 PM

## 2022-01-03 NOTE — Anesthesia Procedure Notes (Signed)
Arterial Line Insertion Start/End6/05/2022 1:35 PM Performed by: Griffin Dakin, CRNA, CRNA  Patient location: OR. Preanesthetic checklist: IV checked Left, radial was placed Maximum sterile barriers used   Procedure performed without using ultrasound guided technique. Following insertion, dressing applied and Biopatch. Patient tolerated the procedure well with no immediate complications.

## 2022-01-03 NOTE — ED Provider Notes (Signed)
[facility] Emergency Department Provider Note MRN:  [id number]  Arrival date & time: 01/03/22     Chief Complaint   Unresponsive and Altered Mental Status   History of Present Illness   Curtis [name] is a 66 y.o. year-old male with a history of rectal cancer presenting to the ED with chief complaint of altered mental status.  Report of altered mental status coming from home.  Warm to the touch, reportedly with a blood pressure of 60 systolic, improved to 90 after fluids with EMS.  Review of Systems  I was unable to obtain a full/accurate HPI, PMH, or ROS due to the patient's altered mental status.  Patient's Health History    Past Medical History:  Diagnosis Date   Rectosigmoid cancer [facility])     Past Surgical History:  Procedure Laterality Date   BIOPSY  01/21/2021   Procedure: BIOPSY;  Surgeon: Mauri Pole, MD;  Location: [place];  Service: Endoscopy;;   COLON RESECTION N/A 01/21/2021   Procedure: LAPAROSCOPIC ASSISTED LOOP COLOSTOMY;  Surgeon: Donnie Mesa, MD;  Location: [place];  Service: General;  Laterality: N/A;   FLEXIBLE SIGMOIDOSCOPY N/A 01/21/2021   Procedure: FLEXIBLE SIGMOIDOSCOPY;  Surgeon: Mauri Pole, MD;  Location: [place];  Service: Endoscopy;  Laterality: N/A;   IR IMAGING GUIDED PORT INSERTION  03/04/2021   SUBMUCOSAL TATTOO INJECTION  01/21/2021   Procedure: SUBMUCOSAL TATTOO INJECTION;  Surgeon: Mauri Pole, MD;  [facility];  Service: Endoscopy;;    Family History  Problem Relation Age of Onset   Hypertension Mother    Hypertension Father    Breast cancer Sister     Social History   Socioeconomic History   Marital status: Divorced    Spouse name: Not on file   Number of children: Not on file   Years of education: Not on file   Highest education level: Not on file  Occupational History   Not on file  Tobacco Use   Smoking status: Every Day    Types: Cigarettes   Smokeless  tobacco: Never  Vaping Use   Vaping Use: Never used  Substance and Sexual Activity   Alcohol use: Never   Drug use: Never   Sexual activity: Never  Other Topics Concern   Not on file  Social History Narrative   Not on file   Social Determinants of Health   Financial Resource Strain: Low Risk  (02/09/2021)   Overall Financial Resource Strain (CARDIA)    Difficulty of Paying Living Expenses: Not very hard  Food Insecurity: No Food Insecurity (02/09/2021)   Hunger Vital Sign    Worried About Running Out of Food in the Last Year: Never true    Ran Out of Food in the Last Year: Never true  Transportation Needs: No Transportation Needs (02/09/2021)   PRAPARE - Hydrologist (Medical): No    Lack of Transportation (Non-Medical): No  Physical Activity: Not on file  Stress: Not on file  Social Connections: Not on file  Intimate Partner Violence: Not At Risk (02/26/2021)   Humiliation, Afraid, Rape, and Kick questionnaire    Fear of Current or Ex-Partner: No    Emotionally Abused: No    Physically Abused: No    Sexually Abused: No     Physical Exam   Vitals:   01/03/22 0630 01/03/22 0645  BP: (!) 84/49 (!) 93/58  Pulse: 97 95  Resp: (!) 21 19  Temp:  SpO2: 100% 100%    CONSTITUTIONAL: Ill-appearing, NAD NEURO/PSYCH: Somnolent, wakes to voice and light touch, answers some questions, moves all extremities EYES:  eyes equal and reactive ENT/NECK:  no LAD, no JVD CARDIO: Tachycardic rate, well-perfused, normal S1 and S2 PULM:  CTAB no wheezing or rhonchi GI/GU:  non-distended, non-tender, colostomy in place MSK/SPINE:  No gross deformities, no edema SKIN:  no rash, atraumatic, warm to the touch   *Additional and/or pertinent findings included in MDM below  Diagnostic and Interventional Summary    EKG Interpretation  Date/Time:  Sunday January 03 2022 02:18:38 EDT Ventricular Rate:  95 PR Interval:  187 QRS Duration: 99 QT Interval:  385 QTC  Calculation: 484 R Axis:   64 Text Interpretation: Sinus rhythm Atrial premature complex Borderline prolonged QT interval Confirmed by Gerlene Fee [phone]) on 01/03/2022 3:31:15 AM       Labs Reviewed  COMPREHENSIVE METABOLIC PANEL - Abnormal; Notable for the following components:      Result Value   CO2 15 (*)    BUN 31 (*)    Creatinine, Ser 3.19 (*)    Calcium 8.7 (*)    Total Protein 5.6 (*)    Albumin 2.8 (*)    GFR, Estimated 21 (*)    Anion gap 18 (*)    All other components within normal limits  LACTIC ACID, PLASMA - Abnormal; Notable for the following components:   Lactic Acid, Venous 7.6 (*)    All other components within normal limits  LACTIC ACID, PLASMA - Abnormal; Notable for the following components:   Lactic Acid, Venous 8.3 (*)    All other components within normal limits  CBC WITH DIFFERENTIAL/PLATELET - Abnormal; Notable for the following components:   RBC 3.60 (*)    Hemoglobin 10.6 (*)    HCT 33.0 (*)    nRBC 0.5 (*)    Lymphs Abs 0.3 (*)    nRBC 3 (*)    Abs Immature Granulocytes 1.00 (*)    All other components within normal limits  PROTIME-INR - Abnormal; Notable for the following components:   Prothrombin Time 16.8 (*)    INR 1.4 (*)    All other components within normal limits  GLUCOSE, CAPILLARY - Abnormal; Notable for the following components:   Glucose-Capillary 65 (*)    All other components within normal limits  I-STAT VENOUS BLOOD GAS, ED - Abnormal; Notable for the following components:   pCO2, Ven 30.6 (*)    Bicarbonate 14.0 (*)    TCO2 15 (*)    Acid-base deficit 12.0 (*)    Calcium, Ion 1.12 (*)    HCT 30.0 (*)    Hemoglobin 10.2 (*)    All other components within normal limits  RESP PANEL BY RT-PCR (FLU A&B, COVID) ARPGX2  CULTURE, BLOOD (ROUTINE X 2)  CULTURE, BLOOD (ROUTINE X 2)  URINE CULTURE  MRSA NEXT GEN BY PCR, NASAL  APTT  GLUCOSE, CAPILLARY  URINALYSIS, ROUTINE W REFLEX MICROSCOPIC  CBC  BASIC METABOLIC PANEL   MAGNESIUM  PHOSPHORUS  LACTIC ACID, PLASMA  LACTIC ACID, PLASMA  CBG MONITORING, ED    CT MAXILLOFACIAL WO CONTRAST  Final Result    DG Chest Port 1 View  Final Result    CT ABDOMEN PELVIS WO CONTRAST    (Results Pending)    Medications  lactated ringers infusion ( Intravenous New Bag/Given 01/03/22 0334)  docusate sodium (COLACE) capsule 100 mg (has no administration in time range)  polyethylene glycol (MIRALAX /  GLYCOLAX) packet 17 g (has no administration in time range)  heparin injection 5,000 Units (5,000 Units Subcutaneous Given 01/03/22 0628)  acetaminophen (TYLENOL) tablet 650 mg (has no administration in time range)  ondansetron (ZOFRAN) injection 4 mg (has no administration in time range)  metroNIDAZOLE (FLAGYL) IVPB 500 mg (500 mg Intravenous New Bag/Given 01/03/22 0622)  calcium gluconate 1 g/ 50 mL sodium chloride IVPB (has no administration in time range)  ceFEPIme (MAXIPIME) 2 g in sodium chloride 0.9 % 100 mL IVPB (has no administration in time range)  vancomycin variable dose per unstable renal function (pharmacist dosing) (has no administration in time range)  sodium chloride flush (NS) 0.9 % injection 10-40 mL (has no administration in time range)  sodium chloride flush (NS) 0.9 % injection 10-40 mL (has no administration in time range)  Chlorhexidine Gluconate Cloth 2 % PADS 6 each (6 each Topical Given 01/03/22 0540)  norepinephrine (LEVOPHED) 16 mg in 244m premix infusion (25 mcg/min Intravenous New Bag/Given 01/03/22 0617)  0.9 %  sodium chloride infusion ( Intravenous New Bag/Given 01/03/22 0620)  lactated ringers bolus 1,000 mL (0 mLs Intravenous Stopped 01/03/22 0251)    And  lactated ringers bolus 1,000 mL (0 mLs Intravenous Stopped 01/03/22 0307)    And  lactated ringers bolus 500 mL (0 mLs Intravenous Stopped 01/03/22 0333)  ceFEPIme (MAXIPIME) 2 g in sodium chloride 0.9 % 100 mL IVPB (0 g Intravenous Stopped 01/03/22 0306)  acetaminophen (TYLENOL)  suppository 650 mg (650 mg Rectal Given 01/03/22 0234)  vancomycin (VANCOREADY) IVPB 1750 mg/350 mL (0 mg Intravenous Stopped 01/03/22 0444)  lactated ringers bolus 2,000 mL (2,000 mLs Intravenous New Bag/Given 01/03/22 0633)  albumin human 5 % solution 25 g (25 g Intravenous New Bag/Given 01/03/22 0622)     Procedures  /  Critical Care .Critical Care  Performed by: BMaudie Flakes MD Authorized by: BMaudie Flakes MD   Critical care provider statement:    Critical care time (minutes):  44   Critical care was necessary to treat or prevent imminent or life-threatening deterioration of the following conditions:  Sepsis   Critical care was time spent personally by me on the following activities:  Development of treatment plan with patient or surrogate, discussions with consultants, evaluation of patient's response to treatment, examination of patient, ordering and review of laboratory studies, ordering and review of radiographic studies, ordering and performing treatments and interventions, pulse oximetry, re-evaluation of patient's condition and review of old charts .Central Line  Date/Time: 01/03/2022 7:01 AM  Performed by: BMaudie Flakes MD Authorized by: BMaudie Flakes MD   Consent:    Consent obtained:  Emergent situation Universal protocol:    Patient identity confirmed:  Arm band Pre-procedure details:    Indication(s): central venous access     Hand hygiene: Hand hygiene performed prior to insertion     Sterile barrier technique: All elements of maximal sterile technique followed     Skin preparation:  Chlorhexidine   Skin preparation agent: Skin preparation agent completely dried prior to procedure   Sedation:    Sedation type:  None Anesthesia:    Anesthesia method:  Local infiltration   Local anesthetic:  Lidocaine 1% w/o epi Procedure details:    Location:  R femoral   Patient position:  Supine   Procedural supplies:  Triple lumen   Catheter size:  7 Fr   Landmarks  identified: yes     Ultrasound guidance: yes     Ultrasound guidance  timing: real time     Sterile ultrasound techniques: Sterile gel and sterile probe covers were used     Number of attempts:  1   Successful placement: yes   Post-procedure details:    Post-procedure:  Dressing applied and line sutured   Assessment:  Blood return through all ports and free fluid flow   Procedure completion:  Tolerated well, no immediate complications   ED Course and Medical Decision Making  Initial Impression and Ddx Concern for sepsis, unclear source.  History of rectal cancer, per chart review had febrile neutropenia back in May and so this is a concern again at this time given history of recent chemotherapy.  Rectal temp 101.7, reportedly heart rate of 140 with blood pressure systolic 60 in the field, improved vitals at this time after fluid bolus with EMS, heart rate 94, blood pressure 704 systolic.  Providing 30 cc/kg fluid bolus, empiric antibiotics, awaiting infectious work-up.  Patient protecting airway, responding appropriately but mildly altered.  Abdomen soft and nontender, no hypoxia.  Past medical/surgical history that increases complexity of ED encounter: Rectal cancer, recent neutropenia  Interpretation of Diagnostics I personally reviewed the EKG and my interpretation is as follows: Sinus rhythm  Labs reveal significantly elevated lactate, acidosis.  Patient Reassessment and Ultimate Disposition/Management     Despite aggressive fluid resuscitation patient persistently hypotensive, and so norepinephrine initiated.  Central line obtained as described above.  With resuscitation patient is more alert, easily wakes, converses fairly well.  Admitted to intensivist service for further care.  Patient management required discussion with the following services or consulting groups:  Intensivist Service  Complexity of Problems Addressed Acute illness or injury that poses threat of life of bodily  function  Additional Data Reviewed and Analyzed Further history obtained from: Recent PCP notes  Additional Factors Impacting ED Encounter Risk Consideration of hospitalization and Major procedures  Barth Kirks. Sedonia Small, [place] [email or web address]  Final Clinical Impressions(s) / ED Diagnoses     ICD-10-CM   1. Septic shock ([place])  A41.9    R65.21       ED Discharge Orders     None        Discharge Instructions Discussed with and Provided to Patient:   Discharge Instructions   None      Maudie Flakes, MD 01/03/22 [id number]

## 2022-01-03 NOTE — ED Notes (Addendum)
Lactic acid 7.6. Dr. Sedonia Small informed via face to face encounter. No new verbal orders received.

## 2022-01-03 NOTE — Consult Note (Signed)
[facility] Nurse ostomy consult note Stoma type/location: LLQ colostomy Nursing consulted for peristoma skin irritation/erythema (contact dermatitis related to fecal stoma).  ICD-10 CM Codes for Irritant Dermatitis  L24B3 - Related to fecal or urinary stoma or fistula  Instructions provided for bedside RN regarding the provision of the type of ostomy pouching system used by patient and guidance for treatment of peristomal skin irritant contact dermatitis via the orders. They are also listed below:  Pouching Supplies: 1-piece convex ostomy pouching system with skin barrier ring and belt. Pouch is Kellie Simmering # [id number] Skin barrier ring is Kellie Simmering # [id number] Arlean Hopping is Kellie Simmering # [phone] Ostomy powder is Kellie Simmering #6  Treatment of peristomal skin irritant contact dermatitis: Remove pouch, discard. 2.   Cleanse peristomal skin with warm tap  water. Pat dry.  3.   Sprinkle stoma powder on peristomal skin, brush away excess (a thin layer of powder will adhere to areas of skin loss) 4.  Place skin barrier rings around stoma 5.  Cut ostomy pouch to size of stoma 6.  Remove plastic backing of pouch and place on top of skin barrier rings. 7.  Hold gentle warming hand pressure over pouching system to establish seal (about 1-2 minutes) 8.  Apply ostomy belt.  Change pouch when leaking, do not tape or otherwise delay change as this adds to peristoma skin damage.  Enrolled patient in [place] program: Yes, previously   [place] nursing team will not follow, but will remain available to this patient, the nursing and medical teams.  Please re-consult if needed.  Thank you for inviting [place] to participate in this patient's Plan of Care.  Maudie Flakes, MSN, RN, CNS, [place], Serita Grammes, [employer], [employer] phone:  [phone]

## 2022-01-03 NOTE — Progress Notes (Signed)
Pharmacy Antibiotic Note  Curtis [name] is a 66 y.o. male admitted on 01/03/2022 with sepsis.  Pharmacy has been consulted for vancomycin and cefepime dosing.  Pt w/ AKI: baseline SCr ~1, current 3.19.  Plan: Vancomycin '1750mg'$  IV x1; monitor SCr +/- vanc levels prior to redosing. Cefepime 2g IV Q24H.  Height: '5\' 8"'$  (172.7 cm) Weight: 84.4 kg (186 lb) IBW/kg (Calculated) : 68.4  Temp (24hrs), Avg:100.6 F (38.1 C), Min:99.4 F (37.4 C), Max:101.7 F (38.7 C)  Recent Labs  Lab 01/03/22 0233 01/03/22 0235 01/03/22 0356  WBC 8.4  --   --   CREATININE 3.19*  --   --   LATICACIDVEN  --  7.6* 8.3*    Estimated Creatinine Clearance: 24.8 mL/min (A) (by C-G formula based on SCr of 3.19 mg/dL (H)).    Allergies  Allergen Reactions   Leucovorin Itching and Rash    Pt. had a reaction during infusion with Oxaliplatin. Complained of itching "all over". Red, raised rash noted to anterior chest, upper back, and abdominal area. See progress note 06/22/21  Medicated pt. with Benadryl 50 mg IV then Solumedrol '125mg'$  IV. Waited 20 minutes and chemotherapy restarted   Oxaliplatin Itching and Rash    Pt. had a reaction of itching all over and red, raised rash noted to anterior upper chest, upper back, and abdominal area. See progress note 06/22/21 Medicated pt. with Benadryl 50 mg IV then Solumedrol '125mg'$  IV. Waited 20 minutes and chemotherapy restarted    Thank you for allowing pharmacy to be a part of this patient's care.  Wynona Neat, PharmD, BCPS  01/03/2022 4:45 AM

## 2022-01-03 NOTE — ED Notes (Signed)
Curtis [name] son would like calls for updates

## 2022-01-03 NOTE — Progress Notes (Signed)
NAMEJaqwan [name], MRN:  [id number], DOB:  1955-10-15, LOS: 0 ADMISSION DATE:  01/03/2022, CONSULTATION DATE: 01/03/2022 REFERRING MD: Sedonia Small- EDP, CHIEF COMPLAINT: Septic shock  History of Present Illness:  [name] is a 66 year old gentleman with a history of rectosigmoid cancer status post resection who completed chemotherapy in December 2022 who presents with 2 days of chills and vomiting.  He did not have a fever at home.  He denies abdominal pain, change in stool/ostomy output, cough, sputum production, significant upper respiratory symptoms, wounds, new rashes.  He has chronic skin breakdown that is unchanged around his ostomy site.  He has a history of dental caries that has been more significant since chemotherapy.  He has not seen a dentist recently.  No recent dental changes. Per his son he has been breathing heavily and has been drifting in and out of sleep.  Upon arrival to the ED he was febrile to 101.7.  He was hypotensive requiring initiation of norepinephrine.  CVC placed in the ED.  Pertinent  Medical History  Rectal cancer, status post resection and chemotherapy  Significant Hospital Events: Including procedures, antibiotic start and stop dates in addition to other pertinent events   6/11 admission-started on nor epi, cefepime, Vanco in the ED.  Interim History / Subjective:    Objective   Blood pressure (!) 90/56, pulse 97, temperature 99.1 F (37.3 C), temperature source Axillary, resp. rate (!) 21, height '5\' 10"'$  (1.778 m), weight 88.1 kg, SpO2 100 %.        Intake/Output Summary (Last 24 hours) at 01/03/2022 0829 Last data filed at 01/03/2022 0700 Gross per 24 hour  Intake 2532.23 ml  Output --  Net 2532.23 ml    Filed Weights   01/03/22 0219 01/03/22 0532  Weight: 84.4 kg 88.1 kg    Examination: Gen:   Mild distress HEENT:  EOMI, sclera anicteric Neck:     No masses; no thyromegaly Lungs:    Tachypnea CV:         Regular rate and rhythm; no murmurs Abd:       + bowel sounds; soft, non-tender; no palpable masses, no distension Ext:    No edema; adequate peripheral perfusion Skin:      Warm and dry; no rash Neuro: alert and oriented x 3 Psych: normal mood and affect   Labs/imaging reviewed Significant for potassium 3.4, BUN/creatinine 100/3.17 WBC 15.6, hemoglobin 9.6, platelets 117 Lactic acid improved to 4.2 CT maxillofacial with dental caries with no cellulitis or soft tissue infection/abscess  Resolved Hospital Problem list     Assessment & Plan:  Septic shock of unknown source.  Physical exam unrevealing other than significant dental caries. Continue IV fluids, follow lactic acid On broad antibiotic coverage with cefepime, vancomycin, Flagyl Wean down pressors as tolerated CT abdomen pelvis and chest to evaluate source of infection  Nausea and vomiting Zofran Follow CT abdomen pelvis  AKI due to sepsis Monitor urine output and creatinine  History of rectosigmoid cancer with ostomy bag Routine ostomy care  Surrogate decision maker-son [name].  [name] updated over the phone this morning by admitting team  Best Practice (right click and "Reselect all SmartList Selections" daily)   Diet/type: clear liquids DVT prophylaxis: prophylactic heparin  GI prophylaxis: N/A Lines: Central line-femoral line, has port a catheter indwelling, not accessed Foley:  Yes, and it is still needed Code Status:  full code Last date of multidisciplinary goals of care discussion '[ ]'$   Critical care time:  The patient is critically ill with multiple organ system failure and requires high complexity decision making for assessment and support, frequent evaluation and titration of therapies, advanced monitoring, review of radiographic studies and interpretation of complex data.   Critical Care Time devoted to patient care services, exclusive of separately billable procedures, described in this note is 35 minutes.   Marshell Garfinkel MD [place]  Pulmonary & Critical care See Amion for pager  If no response to pager , please call [phone] until 7pm After 7:00 pm call Elink  [phone] 01/03/2022, 8:30 AM

## 2022-01-03 NOTE — Op Note (Addendum)
PATIENT:  Curtis [name]  Preoperative diagnosis:  Sepsis Right ureteral stone    Postoperative diagnosis:  Sepsis  Right ureteral stone    Procedure:  Cystoscopy Right ureteral stent placement (6Fr x 26cm) no dangler  Right retrograde pyelography with interpretation   Surgeon: Dr. Harold Barban, M.D.  Resident: Aldine Contes PGY4  Anesthesia: General  Complications: None  EBL: Minimal  Specimens: None  Indication: Curtis [name] is a 66 y.o. male with severe sepsis found to have a right distal 3 mm ureteral stone with upstream hydronephrosis. After reviewing the management options for treatment, they have elected to proceed with the above surgical procedure(s). We have discussed the potential benefits and risks of the procedure, side effects of the proposed treatment, the likelihood of the patient achieving the goals of the procedure, and any potential problems that might occur during the procedure or recuperation. Informed consent has been obtained.  Findings:  -Tight right ureteral orifice -Successful right ureteral stent placement without dangler -Foley placement at end of the procedure   Description of procedure:   The patient was taken to the operating room and general anesthesia was induced.  The patient was placed in the dorsal lithotomy position, prepped and draped in the usual sterile fashion, and preoperative antibiotics were administered. A preoperative time-out was performed.   Cystourethroscopy was performed.  The patient's urethra was examined and was normal. The bladder was then systematically examined in its entirety. There was no evidence for any bladder tumors, stones, or other mucosal pathology.    Attention then turned to the right ureteral orifice and a ureteral catheter was used to intubate the ureteral orifice.  Omnipaque contrast was injected through the ureteral catheter and a retrograde pyelogram which revealed the above findings.  A Sensor guidewire  was then advanced up the right ureter into the renal pelvis under fluoroscopic guidance.  The wire was then backloaded through the cystoscope and a ureteral stent was advance over the wire using Seldinger technique.  The stent was positioned appropriately under fluoroscopic and cystoscopic guidance.  The wire was then removed with an adequate stent curl noted in the renal pelvis as well as in the bladder.  The bladder was then emptied and the procedure ended.  The patient appeared to tolerate the procedure well and without complications.  The patient was able to be awakened and transferred to the recovery unit in satisfactory condition.   Plan:  -Will return to the ICU intubated  -Continue broad spectrum antibiotics until cultures result  -He will need definitive stone treatment in 2-3 weeks after current infection resolves

## 2022-01-03 NOTE — Transfer of Care (Signed)
Immediate Anesthesia Transfer of Care Note  Patient: Curtis [name]  Procedure(s) Performed: CYSTOSCOPY WITH  URETERAL STENT PLACEMENT (Right: Ureter)  Patient Location: PACU  Anesthesia Type:General  Level of Consciousness: Patient remains intubated per anesthesia plan  Airway & Oxygen Therapy: Patient remains intubated per anesthesia plan and Patient placed on Ventilator (see vital sign flow sheet for setting)  Post-op Assessment: Report given to RN and Post -op Vital signs reviewed and stable  Post vital signs: Reviewed and stable  Last Vitals:  Vitals Value Taken Time  BP 98/49   Temp    Pulse 102 01/03/22 1419  Resp 24 01/03/22 1419  SpO2 52 % 01/03/22 1419  Vitals shown include unvalidated device data.  Last Pain:  Vitals:   01/03/22 0852  TempSrc:   PainSc: Asleep         Complications: No notable events documented.

## 2022-01-03 NOTE — ED Notes (Signed)
Dr. Sedonia Small at bedside to insert central line

## 2022-01-03 NOTE — ED Triage Notes (Signed)
PER EMS: pt is from home, initial call out by family with report of unresponsiveness. EMS arrived to find him hot to touch, unresponsive, BP 62G systolic, HR 315V, LSN 2pm. CBG 108. EMS adm 500 cc of NS en route. Pt arrived responsive to verbal stimuli, able to follow commands but very lethargic. GCS 11. Last BP 90 systolic, HR 90.  Pt has hx of colon cancer and has colostomy bag.

## 2022-01-04 ENCOUNTER — Encounter (HOSPITAL_COMMUNITY): Payer: Self-pay | Admitting: Urology

## 2022-01-04 LAB — MAGNESIUM
Magnesium: 2.2 mg/dL (ref 1.7–2.4)
Magnesium: 2.3 mg/dL (ref 1.7–2.4)
Magnesium: 2.4 mg/dL (ref 1.7–2.4)

## 2022-01-04 LAB — CBC
HCT: 26.2 % — ABNORMAL LOW (ref 39.0–52.0)
Hemoglobin: 9.1 g/dL — ABNORMAL LOW (ref 13.0–17.0)
MCH: 30.1 pg (ref 26.0–34.0)
MCHC: 34.7 g/dL (ref 30.0–36.0)
MCV: 86.8 fL (ref 80.0–100.0)
Platelets: 48 10*3/uL — ABNORMAL LOW (ref 150–400)
RBC: 3.02 MIL/uL — ABNORMAL LOW (ref 4.22–5.81)
RDW: 14.6 % (ref 11.5–15.5)
WBC: 6.9 10*3/uL (ref 4.0–10.5)
nRBC: 0 % (ref 0.0–0.2)

## 2022-01-04 LAB — POCT I-STAT 7, (LYTES, BLD GAS, ICA,H+H)
Acid-base deficit: 6 mmol/L — ABNORMAL HIGH (ref 0.0–2.0)
Bicarbonate: 18.8 mmol/L — ABNORMAL LOW (ref 20.0–28.0)
Calcium, Ion: 1.06 mmol/L — ABNORMAL LOW (ref 1.15–1.40)
HCT: 25 % — ABNORMAL LOW (ref 39.0–52.0)
Hemoglobin: 8.5 g/dL — ABNORMAL LOW (ref 13.0–17.0)
O2 Saturation: 99 %
Patient temperature: 37.4
Potassium: 4.3 mmol/L (ref 3.5–5.1)
Sodium: 136 mmol/L (ref 135–145)
TCO2: 20 mmol/L — ABNORMAL LOW (ref 22–32)
pCO2 arterial: 33.3 mmHg (ref 32–48)
pH, Arterial: 7.361 (ref 7.35–7.45)
pO2, Arterial: 127 mmHg — ABNORMAL HIGH (ref 83–108)

## 2022-01-04 LAB — VANCOMYCIN, RANDOM: Vancomycin Rm: 17 ug/mL

## 2022-01-04 LAB — COMPREHENSIVE METABOLIC PANEL
ALT: 211 U/L — ABNORMAL HIGH (ref 0–44)
AST: 322 U/L — ABNORMAL HIGH (ref 15–41)
Albumin: 2.5 g/dL — ABNORMAL LOW (ref 3.5–5.0)
Alkaline Phosphatase: 44 U/L (ref 38–126)
Anion gap: 13 (ref 5–15)
BUN: 37 mg/dL — ABNORMAL HIGH (ref 8–23)
CO2: 17 mmol/L — ABNORMAL LOW (ref 22–32)
Calcium: 7.5 mg/dL — ABNORMAL LOW (ref 8.9–10.3)
Chloride: 106 mmol/L (ref 98–111)
Creatinine, Ser: 2.78 mg/dL — ABNORMAL HIGH (ref 0.61–1.24)
GFR, Estimated: 25 mL/min — ABNORMAL LOW (ref >60)
Glucose, Bld: 89 mg/dL (ref 70–99)
Potassium: 4.4 mmol/L (ref 3.5–5.1)
Sodium: 136 mmol/L (ref 135–145)
Total Bilirubin: 1.3 mg/dL — ABNORMAL HIGH (ref 0.3–1.2)
Total Protein: 5 g/dL — ABNORMAL LOW (ref 6.5–8.1)

## 2022-01-04 LAB — GLUCOSE, CAPILLARY
Glucose-Capillary: 154 mg/dL — ABNORMAL HIGH (ref 70–99)
Glucose-Capillary: 70 mg/dL (ref 70–99)
Glucose-Capillary: 84 mg/dL (ref 70–99)
Glucose-Capillary: 84 mg/dL (ref 70–99)
Glucose-Capillary: 92 mg/dL (ref 70–99)
Glucose-Capillary: 96 mg/dL (ref 70–99)
Glucose-Capillary: <10 mg/dL — CL (ref 70–99)
Glucose-Capillary: <10 mg/dL — CL (ref 70–99)

## 2022-01-04 LAB — DIC (DISSEMINATED INTRAVASCULAR COAGULATION)PANEL
D-Dimer, Quant: 17.66 ug/mL-FEU — ABNORMAL HIGH (ref 0.00–0.50)
Fibrinogen: 613 mg/dL — ABNORMAL HIGH (ref 210–475)
INR: 2.5 — ABNORMAL HIGH (ref 0.8–1.2)
Platelets: UNDETERMINED 10*3/uL (ref 150–400)
Prothrombin Time: 27.2 seconds — ABNORMAL HIGH (ref 11.4–15.2)
Smear Review: NONE SEEN
aPTT: 68 seconds — ABNORMAL HIGH (ref 24–36)

## 2022-01-04 LAB — LACTIC ACID, PLASMA
Lactic Acid, Venous: 3.7 mmol/L (ref 0.5–1.9)
Lactic Acid, Venous: 3 mmol/L (ref 0.5–1.9)

## 2022-01-04 LAB — PHOSPHORUS
Phosphorus: 5 mg/dL — ABNORMAL HIGH (ref 2.5–4.6)
Phosphorus: 4.9 mg/dL — ABNORMAL HIGH (ref 2.5–4.6)

## 2022-01-04 LAB — TRIGLYCERIDES: Triglycerides: 381 mg/dL — ABNORMAL HIGH (ref <150)

## 2022-01-04 MED ORDER — DEXTROSE 50 % IV SOLN
INTRAVENOUS | Status: AC
  Filled 2022-01-04: qty 50

## 2022-01-04 MED ORDER — SODIUM CHLORIDE 0.9 % IV SOLN
2.0000 g | INTRAVENOUS | Status: DC
  Administered 2022-01-04 – 2022-01-05 (×2): 2 g via INTRAVENOUS
  Filled 2022-01-04 (×3): qty 20

## 2022-01-04 MED ORDER — DEXMEDETOMIDINE HCL IN NACL 400 MCG/100ML IV SOLN
0.0000 ug/kg/h | INTRAVENOUS | Status: DC
  Administered 2022-01-04 (×2): 0.8 ug/kg/h via INTRAVENOUS
  Administered 2022-01-04: 0.4 ug/kg/h via INTRAVENOUS
  Administered 2022-01-05 (×2): 0.7 ug/kg/h via INTRAVENOUS
  Filled 2022-01-04 (×5): qty 100

## 2022-01-04 MED ORDER — VITAL 1.5 CAL PO LIQD
1000.0000 mL | ORAL | Status: DC
Start: 2022-01-04 — End: 2022-01-05
  Administered 2022-01-04: 1000 mL

## 2022-01-04 MED ORDER — FENTANYL CITRATE (PF) 100 MCG/2ML IJ SOLN: 50.0000 ug | INTRAMUSCULAR | Status: DC | PRN

## 2022-01-04 MED ORDER — PANTOPRAZOLE 2 MG/ML SUSPENSION
40.0000 mg | Freq: Every day | ORAL | Status: DC
Start: 2022-01-04 — End: 2022-01-06
  Administered 2022-01-04 – 2022-01-05 (×2): 40 mg
  Filled 2022-01-04 (×3): qty 20

## 2022-01-04 MED ORDER — DOCUSATE SODIUM 50 MG/5ML PO LIQD: 100.0000 mg | Freq: Two times a day (BID) | ORAL | Status: DC

## 2022-01-04 MED ORDER — POLYETHYLENE GLYCOL 3350 17 G PO PACK: 17.0000 g | PACK | Freq: Every day | ORAL | Status: DC

## 2022-01-04 MED ORDER — PROSOURCE TF PO LIQD
45.0000 mL | Freq: Three times a day (TID) | ORAL | Status: DC
  Administered 2022-01-04 – 2022-01-05 (×3): 45 mL
  Filled 2022-01-04 (×3): qty 45

## 2022-01-04 MED ORDER — DEXTROSE 50 % IV SOLN
INTRAVENOUS | Status: AC
  Administered 2022-01-04: 50 mL
  Filled 2022-01-04: qty 50

## 2022-01-04 NOTE — Progress Notes (Addendum)
NAMEMacallan [name], MRN:  [id number], DOB:  1956/03/01, LOS: 1 ADMISSION DATE:  01/03/2022, CONSULTATION DATE: 01/03/2022 REFERRING MD: Curtis [name]- EDP, CHIEF COMPLAINT: Septic shock  History of Present Illness:  [name] is a 66 year old gentleman with a history of rectosigmoid cancer status post resection who completed chemotherapy in December 2022 who presents with 2 days of chills and vomiting.  He did not have a fever at home.  He denies abdominal pain, change in stool/ostomy output, cough, sputum production, significant upper respiratory symptoms, wounds, new rashes.  He has chronic skin breakdown that is unchanged around his ostomy site.  He has a history of dental caries that has been more significant since chemotherapy.  He has not seen a dentist recently.  No recent dental changes. Per his son he has been breathing heavily and has been drifting in and out of sleep.  Upon arrival to the ED he was febrile to 101.7.  He was hypotensive requiring initiation of norepinephrine.  CVC placed in the ED.  Pertinent  Medical History  Rectal cancer, status post resection and chemotherapy  Significant Hospital Events: Including procedures, antibiotic start and stop dates in addition to other pertinent events   6/11 admission-started on nor epi, cefepime, Vanco in the ED. Underwent right ureteral stent placement.  Surgery consulted for fluid collection in the presacral area.  Intubated and on pressors  Interim History / Subjective:   Remains on pressors and vent Lactic acid elevated Blood cultures growing E. coli  Objective   Blood pressure (!) 101/58, pulse 80, temperature 99.1 F (37.3 C), resp. rate 17, height '5\' 10"'$  (1.778 m), weight 97.1 kg, SpO2 100 %.    Vent Mode: PRVC FiO2 (%):  [40 %-100 %] 40 % Set Rate:  [24 bmp] 24 bmp Vt Set:  [580 mL] 580 mL PEEP:  [5 cmH20] 5 cmH20 Plateau Pressure:  [15 cmH20-18 cmH20] 15 cmH20   Intake/Output Summary (Last 24 hours) at 01/04/2022 0818 Last  data filed at 01/04/2022 0600 Gross per 24 hour  Intake 7026.45 ml  Output 1375 ml  Net 5651.45 ml   Filed Weights   01/03/22 0219 01/03/22 0532 01/04/22 0500  Weight: 84.4 kg 88.1 kg 97.1 kg    Examination: Gen:      No acute distress HEENT:  EOMI, sclera anicteric Neck:     No masses; no thyromegaly, ET tube Lungs:    Clear to auscultation bilaterally; normal respiratory effort CV:         Regular rate and rhythm; no murmurs Abd:      + bowel sounds; soft, non-tender; no palpable masses, no distension Ext:    No edema; adequate peripheral perfusion Skin:      Warm and dry; no rash Neuro: Sedated, unresponsive  Labs/imaging reviewed Significant for creatinine 2.78 AST 322, ALT 211, bilirubin 1.3 Platelets reduced to 48  CT chest abdomen pelvis with mild interstitial edema, fluid collection in the presacral area, right hydroureter with ureteral stone.  Resolved Hospital Problem list     Assessment & Plan:  Septic shock secondary to E. coli bacteremia Possible sources include urosepsis versus presacral abdominal fluid collection Appreciate input from surgery who feels that the fluid collection is likely not a big contributor Underwent ureteral stent placement Continue IV fluids, follow lactic acid Narrow antibiotics to ceftriaxone Wean down pressors as tolerated  AKI due to sepsis Monitor urine output and creatinine  History of rectosigmoid cancer with ostomy bag Routine ostomy care  Thrombocytopenia, elevated  INR Likely in DIC.  Check fibrinogen, D-dimer.  Monitor labs  Elevated LFTs Likely due to shock liver.  Monitor labs.  Best Practice (right click and "Reselect all SmartList Selections" daily)   Diet/type: tubefeeds DVT prophylaxis: prophylactic heparin  GI prophylaxis: N/A Lines: Central line-femoral line, has port a catheter indwelling, not accessed Foley:  Yes, and it is still needed Code Status:  full code Last date of multidisciplinary goals of  care discussion '[ ]'$   Critical care time:    The patient is critically ill with multiple organ system failure and requires high complexity decision making for assessment and support, frequent evaluation and titration of therapies, advanced monitoring, review of radiographic studies and interpretation of complex data.   Critical Care Time devoted to patient care services, exclusive of separately billable procedures, described in this note is 35 minutes.   Curtis Garfinkel MD [facility] See Amion for pager  If no response to pager , please call [phone] until 7pm After 7:00 pm call Elink  [phone] 01/04/2022, 8:28 AM

## 2022-01-04 NOTE — Progress Notes (Signed)
Initial Nutrition Assessment  DOCUMENTATION CODES:   Not applicable  INTERVENTION:   Initiate tube feeds via OG tube: - Start Vital 1.5 @ 20 ml/hr and advance by 10 ml q 8 hours to goal rate of 55 ml/hr (1320 ml/day) - ProSource TF 45 ml TID  Tube feeding regimen at goal rate provides 2100 kcal, 122 grams of protein, and 1008 ml of H2O.  NUTRITION DIAGNOSIS:   Inadequate oral intake related to inability to eat as evidenced by NPO status.  GOAL:   Patient will meet greater than or equal to 90% of their needs  MONITOR:   Vent status, Labs, Weight trends, TF tolerance, I & O's  REASON FOR ASSESSMENT:   Ventilator, Consult Enteral/tube feeding initiation and management  ASSESSMENT:   66 year old male who presented to the ED on 6/11 after being found unresponsive by family. 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 Pt admitted with septic shock secondary to E. coli bacteremia, AKI, R distal ureteral stone with mild upstream hydronephrosis.  06/11 - s/p cystoscopy, R ureteral stent placement, and R retrograde pyelography with interpretation  Discussed pt with RN and during ICU rounds. Pt remains intubated post-op. Consult received for enteral nutrition initiation and management. Pt with OG tube in stomach per abdominal x-ray, currently clamped.  Unable to obtain diet and weight history at this time. Reviewed weight history in chart. Current weight is elevated compared to weight on admission; suspect this is related to positive fluid balance. Otherwise, weight appears fairly stable over the last year and has been trending up slightly over the last 6 months.  RD to start tube feeds at trickle rate and slowly advance to goal rate. Discussed plan with RN.  Current weight: 88.1 kg Current weight: 97.1 kg  Patient is currently intubated on ventilator support MV: 13.7 L/min Temp (24hrs), Avg:98.9 F  (37.2 C), Min:97.5 F (36.4 C), Max:99.3 F (37.4 C)  Drips: Precedex Levophed Vasopressin LR: 125 ml/hr  Medications reviewed and include: protonix, IV abx  Labs reviewed: BUN 37, creatinine 2.78, ionized calcium 1.06, magnesium 1.1 on 6/11 (repeat magnesium pending), elevated LFTs, TG 381, lactic acid 3.0 (trending down), hemoglobin 9.1, platelets 48 CBG's: 79-92 x 24 hours  UOP: 600 ml x 24 hours Ileostomy: 775 ml x 24 hours I/O's: +9.2 L since admit  NUTRITION - FOCUSED PHYSICAL EXAM:  Flowsheet Row Most Recent Value  Orbital Region Mild depletion  Upper Arm Region No depletion  Thoracic and Lumbar Region No depletion  Buccal Region Unable to assess  Temple Region Moderate depletion  Clavicle Bone Region No depletion  Clavicle and Acromion Bone Region Mild depletion  Scapular Bone Region No depletion  Dorsal Hand No depletion  Patellar Region No depletion  Anterior Thigh Region Mild depletion  Posterior Calf Region No depletion  Edema (RD Assessment) None  Hair Reviewed  Eyes Reviewed  Mouth Reviewed  Skin Reviewed  Nails Reviewed       Diet Order:   Diet Order             Diet NPO time specified  Diet effective now                   EDUCATION NEEDS:   No education needs have been identified at this time  Skin:  Skin Assessment: Reviewed RN Assessment (incision to perineum)  Last BM:  01/03/22  Height:   Ht Readings from Last 1 Encounters:  01/03/22 '5\' 10"'$  (1.778 m)    Weight:   Wt Readings from Last 1 Encounters:  01/04/22 97.1 kg    BMI:  Body mass index is 30.72 kg/m.  Estimated Nutritional Needs:   Kcal:  2100-2300  Protein:  115-130 grams  Fluid:  2.0 L    Gustavus Bryant, MS, RD, LDN Inpatient Clinical Dietitian Please see AMiON for contact information.

## 2022-01-04 NOTE — Progress Notes (Signed)
Will plan to hold on aspiration as his urine is growing gram - rods, c/w with is noted in his blood cultures.  At this time, given no air in this presacral collection and overall benign appearing collection, would not recommend intervention or abx for this.  We will be available as needed.  Henreitta Cea 1:57 PM 01/04/2022

## 2022-01-04 NOTE — Progress Notes (Signed)
1 Day Post-Op  Subjective: Still on 2 pressors today and lactic acid still elevated but down trending.  Sedated on vent  ROS: unable  Objective: Vital signs in last 24 hours: Temp:  [97.5 F (36.4 C)-99.3 F (37.4 C)] 99.1 F (37.3 C) (06/12 0615) Pulse Rate:  [74-119] 77 (06/12 0750) Resp:  [14-34] 17 (06/12 0615) BP: (78-113)/(46-78) 85/48 (06/12 0750) SpO2:  [52 %-100 %] 98 % (06/12 0750) Arterial Line BP: (81-142)/(38-70) 142/62 (06/12 0615) FiO2 (%):  [40 %-100 %] 40 % (06/12 0750) Weight:  [97.1 kg] 97.1 kg (06/12 0500) Last BM Date : 01/03/22  Intake/Output from previous day: 06/11 0701 - 06/12 0700 In: 8092.9 [I.V.:2904.6; IV Piggyback:5188.3] Out: 1375 [Urine:600; Stool:775] Intake/Output this shift: No intake/output data recorded.  PE: Gen: critically ill on vent Abd: soft, ileostomy present with clear water like output, no bile tinge to this at all.  Stoma is viable.  One remaining staple from surgery present.  Lab Results:  Recent Labs    01/03/22 0628 01/03/22 1342 01/04/22 0115 01/04/22 0233  WBC 15.6*  --   --  6.9  HGB 9.6*   < > 8.5* 9.1*  HCT 28.6*   < > 25.0* 26.2*  PLT 117*  --   --  48*   < > = values in this interval not displayed.   BMET Recent Labs    01/03/22 0628 01/03/22 1342 01/04/22 0108 01/04/22 0115  NA 136   < > 136 136  K 3.4*   < > 4.4 4.3  CL 105  --  106  --   CO2 16*  --  17*  --   GLUCOSE 100*  --  89  --   BUN 33*  --  37*  --   CREATININE 3.17*  --  2.78*  --   CALCIUM 8.1*  --  7.5*  --    < > = values in this interval not displayed.   PT/INR Recent Labs    01/03/22 0233  LABPROT 16.8*  INR 1.4*   CMP     Component Value Date/Time   NA 136 01/04/2022 0115   K 4.3 01/04/2022 0115   CL 106 01/04/2022 0108   CO2 17 (L) 01/04/2022 0108   GLUCOSE 89 01/04/2022 0108   BUN 37 (H) 01/04/2022 0108   CREATININE 2.78 (H) 01/04/2022 0108   CREATININE 1.16 12/16/2021 1027   CALCIUM 7.5 (L) 01/04/2022  0108   PROT 5.0 (L) 01/04/2022 0108   ALBUMIN 2.5 (L) 01/04/2022 0108   AST 322 (H) 01/04/2022 0108   AST 16 12/16/2021 1027   ALT 211 (H) 01/04/2022 0108   ALT 16 12/16/2021 1027   ALKPHOS 44 01/04/2022 0108   BILITOT 1.3 (H) 01/04/2022 0108   BILITOT 0.3 12/16/2021 1027   GFRNONAA 25 (L) 01/04/2022 0108   GFRNONAA >60 12/16/2021 1027   Lipase     Component Value Date/Time   LIPASE 20 01/16/2021 1515       Studies/Results: DG Retrograde Pyelogram  Result Date: 01/03/2022 CLINICAL DATA:  Cystoscopy with stent placement EXAM: RETROGRADE PYELOGRAM COMPARISON:  KUB and CT CAP, same day. FLUOROSCOPY: Exposure Index (as provided by the fluoroscopic device): 8.4 mGy Kerma FINDINGS: Limited frontal planar images of the RIGHT lower quadrant obtained C-arm. Images demonstrating ureteroscopic instrumentation, retrograde pyelogram and ureteral stent placement. Distal catheter tip within the RIGHT urinary pelvis. No ureteral dilation. IMPRESSION: Fluoroscopic imaging for ureteroscopy and RIGHT ureteral stent placement. For complete  description of intra procedural findings, please see performing service dictation. Electronically Signed   By: Michaelle Birks M.D.   On: 01/03/2022 18:25   DG Abd Portable 1V  Result Date: 01/03/2022 CLINICAL DATA:  NG tube placement. EXAM: PORTABLE ABDOMEN - 1 VIEW COMPARISON:  CT, 01/03/2022 FINDINGS: Nasogastric tube passes below the diaphragm, tip in the proximal to mid stomach, well positioned. IMPRESSION: Well-positioned nasal/orogastric tube. Electronically Signed   By: Lajean Manes M.D.   On: 01/03/2022 15:59   CT CHEST ABDOMEN PELVIS WO CONTRAST  Result Date: 01/03/2022 CLINICAL DATA:  Sepsis.  History of rectosigmoid cancer. EXAM: CT CHEST, ABDOMEN AND PELVIS WITHOUT CONTRAST TECHNIQUE: Multidetector CT imaging of the chest, abdomen and pelvis was performed following the standard protocol without IV contrast. RADIATION DOSE REDUCTION: This exam was performed  according to the departmental dose-optimization program which includes automated exposure control, adjustment of the mA and/or kV according to patient size and/or use of iterative reconstruction technique. COMPARISON:  CT chest 02/19/2021, MR abdomen 02/16/2021 and CT AP 01/19/2021 FINDINGS: CT CHEST FINDINGS Cardiovascular: Heart size appears normal. No pericardial effusion. Aortic atherosclerosis and coronary artery calcifications. Mediastinum/Nodes: Thyroid gland, trachea, and esophagus demonstrate no significant findings. No enlarged axillary, supraclavicular, or mediastinal lymph nodes. Lungs/Pleura: Centrilobular and paraseptal emphysema. Small bilateral pleural effusions. Mild bilateral lower lung zone predominant interlobular septal thickening concerning for interstitial edema. There is no airspace consolidation, atelectasis or pneumothorax. Tiny nodule within the left apex measures 4 mm, image 33/7. Not confidently identified on previous exam. Musculoskeletal: No chest wall mass or suspicious bone lesions identified. CT ABDOMEN PELVIS FINDINGS Hepatobiliary: Within the limitations of unenhanced technique there is no focal liver abnormality. Gallbladder appears unremarkable. No bile duct dilatation. Pancreas: Unremarkable. No pancreatic ductal dilatation or surrounding inflammatory changes. Spleen: Normal in size without focal abnormality. Adrenals/Urinary Tract: Normal appearance of the adrenal glands. Normal left kidney. Small stone within inferior pole of the right kidney measures 4 mm, image 202/6. There is right pelvocaliectasis identified, similar to 01/19/2021. There is a stone within the distal right ureter measuring 3 mm, image 69/11. mild circumferential wall thickening involving the urinary bladder with surrounding soft tissue stranding. Stomach/Bowel: Stomach is unremarkable. 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 No pathologic dilatation of the large or small bowel loops to suggest bowel  obstruction. Anastomotic suture line is identified within the lower rectum, image 123/4. Vascular/Lymphatic: Aortic atherosclerosis. No signs of abdominopelvic adenopathy. Reproductive: Prostate is unremarkable. Other: Soft tissue stranding and free fluid is noted extending along the right paracolic gutter towards the inferior margin of right lobe of liver, image 86/4. Within the presacral region there is a nonspecific fluid collection measuring 8.1 by 2.5 x 4.8 cm, image 118/4 and image 27/12. This is incompletely characterized without IV contrast. Within the anterior left pelvis there is a fat density area with internal soft tissue stranding with a thin peripheral soft tissue rim which likely represents an area of fat necrosis, image 111/4. Musculoskeletal: No acute osseous findings. No suspicious bone lesions noted. IMPRESSION: 1. Mild interstitial edema with small bilateral pleural effusions. 2. Right-sided pelvocaliectasis and right hydroureter secondary to 3 mm distal right ureteral stone. 3. Nonspecific fluid collection within the presacral region. This is incompletely characterized without IV contrast. Although this could represent a benign postoperative fluid collection, abscess is not excluded. 4. Free fluid and fat stranding is noted extending along the right pericolic gutter. 5. Tiny nodule within the left apex is new from previous exam measuring  4 mm. 6. Aortic Atherosclerosis (ICD10-I70.0) and Emphysema (ICD10-J43.9). Electronically Signed   By: Kerby Moors M.D.   On: 01/03/2022 10:31   CT MAXILLOFACIAL WO CONTRAST  Result Date: 01/03/2022 CLINICAL DATA:  Sepsis with unknown source, severe dental caries, evaluate for dental abscess. EXAM: CT MAXILLOFACIAL WITHOUT CONTRAST TECHNIQUE: Multidetector CT imaging of the maxillofacial structures was performed. Multiplanar CT image reconstructions were also generated. RADIATION DOSE REDUCTION: This exam was performed according to the departmental  dose-optimization program which includes automated exposure control, adjustment of the mA and/or kV according to patient size and/or use of iterative reconstruction technique. COMPARISON:  None Available. FINDINGS: Osseous: Dental caries with periapical lucency involving left central incisor and remaining premolar in the left mandible. No evidence of adjacent cellulitis. No gross abscess. Orbits: Negative Sinuses: No active sinusitis. Soft tissues: No evidence of acute inflammation. Limited intracranial: Negative IMPRESSION: 1. Dental caries and left mandibular periapical erosions. No evidence of odontogenic soft tissue infection or abscess. 2. Negative for sinusitis. Electronically Signed   By: Jorje Guild M.D.   On: 01/03/2022 05:44   DG Chest Port 1 View  Result Date: 01/03/2022 CLINICAL DATA:  Sepsis EXAM: PORTABLE CHEST 1 VIEW COMPARISON:  None Available. FINDINGS: The heart size and mediastinal contours are within normal limits. Both lungs are clear. The visualized skeletal structures are unremarkable. Right chest wall Port-A-Cath tip in the lower SVC. IMPRESSION: No active disease. Electronically Signed   By: Ulyses Jarred M.D.   On: 01/03/2022 02:46    Anti-infectives: Anti-infectives (From admission, onward)    Start     Dose/Rate Route Frequency Ordered Stop   01/04/22 1000  cefTRIAXone (ROCEPHIN) 2 g in sodium chloride 0.9 % 100 mL IVPB        2 g 200 mL/hr over 30 Minutes Intravenous Every 24 hours 01/04/22 0828     01/04/22 0000  ceFEPIme (MAXIPIME) 2 g in sodium chloride 0.9 % 100 mL IVPB  Status:  Discontinued        2 g 200 mL/hr over 30 Minutes Intravenous Every 24 hours 01/03/22 0448 01/04/22 0828   01/03/22 0448  vancomycin variable dose per unstable renal function (pharmacist dosing)  Status:  Discontinued         Does not apply See admin instructions 01/03/22 0448 01/04/22 0828   01/03/22 0430  metroNIDAZOLE (FLAGYL) IVPB 500 mg  Status:  Discontinued        500 mg 100  mL/hr over 60 Minutes Intravenous Every 8 hours 01/03/22 0418 01/04/22 0828   01/03/22 0230  ceFEPIme (MAXIPIME) 2 g in sodium chloride 0.9 % 100 mL IVPB        2 g 200 mL/hr over 30 Minutes Intravenous  Once 01/03/22 0220 01/03/22 0306   01/03/22 0230  vancomycin (VANCOREADY) IVPB 1750 mg/350 mL        1,750 mg 175 mL/hr over 120 Minutes Intravenous  Once 01/03/22 0227 01/03/22 0444        Assessment/Plan 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  WBC improving, but still in shock on 2 pressors despite intervention for UTI/nephrolithiasis, as well as lactic acid elevation, etc, will ask IR to aspirate this presacral fluid collection to take it off the table as a possible source -he has had no imaging since his surgery so unclear if this is new or just  post op fluid collection.  FEN - NPO on vent/IVFs VTE - on hold due to suspected DIC ID - Rocephin  Septic shock, likely urosepsis in origin, s/p stent placement by uro E coli bacteremia Shock liver Suspect DIC AKI - improved somewhat today  I reviewed Consultant uro notes, CCM notes, last 24 h vitals and pain scores, last 48 h intake and output, last 24 h labs and trends, and last 24 h imaging results.   LOS: 1 day    Henreitta Cea , [facility] 01/04/2022, 8:45 AM Please see Amion for pager number during day hours 7:00am-4:30pm or 7:00am -11:30am on weekends

## 2022-01-04 NOTE — Progress Notes (Addendum)
1 Day Post-Op left ureteral stent placement   interval: -Remains intubated and sedated  -Coming down on pressors but still requiring norepi and vaso  -Oliguric but creatinine improving  -Blood and urine cultures with gram negative rods   Objective: Vital signs in last 24 hours: Temp:  [97.9 F (36.6 C)-99.3 F (37.4 C)] 98.6 F (37 C) (06/12 1200) Pulse Rate:  [66-94] 66 (06/12 1200) Resp:  [14-28] 22 (06/12 1200) BP: (78-113)/(46-59) 85/48 (06/12 0750) SpO2:  [52 %-100 %] 100 % (06/12 1200) Arterial Line BP: (81-142)/(38-70) 103/52 (06/12 1200) FiO2 (%):  [40 %-50 %] 40 % (06/12 1200) Weight:  [97.1 kg] 97.1 kg (06/12 0500)  Intake/Output from previous day: 06/11 0701 - 06/12 0700 In: 8320.3 [I.V.:3045.5; IV Piggyback:5274.8] Out: 2992 [Urine:600; Stool:775] Intake/Output this shift: Total I/O In: 1052.5 [I.V.:916.1; NG/GT:22.9; IV Piggyback:113.5] Out: 450 [Urine:125; Stool:325]  Physical Exam:  General: Intubated and sedated  CV: RRR Lungs: Clear Abdomen: Soft, ND, NGT in place GU: Foley catheter in place, urine is amber  Ext: NT, No erythema  Lab Results: Recent Labs    01/03/22 1955 01/04/22 0115 01/04/22 0233  HGB 8.8* 8.5* 9.1*  HCT 26.0* 25.0* 26.2*   BMET Recent Labs    01/03/22 0628 01/03/22 1342 01/04/22 0108 01/04/22 0115  NA 136   < > 136 136  K 3.4*   < > 4.4 4.3  CL 105  --  106  --   CO2 16*  --  17*  --   GLUCOSE 100*  --  89  --   BUN 33*  --  37*  --   CREATININE 3.17*  --  2.78*  --   CALCIUM 8.1*  --  7.5*  --    < > = values in this interval not displayed.     Studies/Results: DG Retrograde Pyelogram  Result Date: 01/03/2022 CLINICAL DATA:  Cystoscopy with stent placement EXAM: RETROGRADE PYELOGRAM COMPARISON:  KUB and CT CAP, same day. FLUOROSCOPY: Exposure Index (as provided by the fluoroscopic device): 8.4 mGy Kerma FINDINGS: Limited frontal planar images of the RIGHT lower quadrant obtained C-arm. Images demonstrating  ureteroscopic instrumentation, retrograde pyelogram and ureteral stent placement. Distal catheter tip within the RIGHT urinary pelvis. No ureteral dilation. IMPRESSION: Fluoroscopic imaging for ureteroscopy and RIGHT ureteral stent placement. For complete description of intra procedural findings, please see performing service dictation. Electronically Signed   By: Michaelle Birks M.D.   On: 01/03/2022 18:25   DG Abd Portable 1V  Result Date: 01/03/2022 CLINICAL DATA:  NG tube placement. EXAM: PORTABLE ABDOMEN - 1 VIEW COMPARISON:  CT, 01/03/2022 FINDINGS: Nasogastric tube passes below the diaphragm, tip in the proximal to mid stomach, well positioned. IMPRESSION: Well-positioned nasal/orogastric tube. Electronically Signed   By: Lajean Manes M.D.   On: 01/03/2022 15:59   CT CHEST ABDOMEN PELVIS WO CONTRAST  Result Date: 01/03/2022 CLINICAL DATA:  Sepsis.  History of rectosigmoid cancer. EXAM: CT CHEST, ABDOMEN AND PELVIS WITHOUT CONTRAST TECHNIQUE: Multidetector CT imaging of the chest, abdomen and pelvis was performed following the standard protocol without IV contrast. RADIATION DOSE REDUCTION: This exam was performed according to the departmental dose-optimization program which includes automated exposure control, adjustment of the mA and/or kV according to patient size and/or use of iterative reconstruction technique. COMPARISON:  CT chest 02/19/2021, MR abdomen 02/16/2021 and CT AP 01/19/2021 FINDINGS: CT CHEST FINDINGS Cardiovascular: Heart size appears normal. No pericardial effusion. Aortic atherosclerosis and coronary artery calcifications. Mediastinum/Nodes: Thyroid gland, trachea, and esophagus  demonstrate no significant findings. No enlarged axillary, supraclavicular, or mediastinal lymph nodes. Lungs/Pleura: Centrilobular and paraseptal emphysema. Small bilateral pleural effusions. Mild bilateral lower lung zone predominant interlobular septal thickening concerning for interstitial edema. There is  no airspace consolidation, atelectasis or pneumothorax. Tiny nodule within the left apex measures 4 mm, image 33/7. Not confidently identified on previous exam. Musculoskeletal: No chest wall mass or suspicious bone lesions identified. CT ABDOMEN PELVIS FINDINGS Hepatobiliary: Within the limitations of unenhanced technique there is no focal liver abnormality. Gallbladder appears unremarkable. No bile duct dilatation. Pancreas: Unremarkable. No pancreatic ductal dilatation or surrounding inflammatory changes. Spleen: Normal in size without focal abnormality. Adrenals/Urinary Tract: Normal appearance of the adrenal glands. Normal left kidney. Small stone within inferior pole of the right kidney measures 4 mm, image 202/6. There is right pelvocaliectasis identified, similar to 01/19/2021. There is a stone within the distal right ureter measuring 3 mm, image 69/11. mild circumferential wall thickening involving the urinary bladder with surrounding soft tissue stranding. Stomach/Bowel: Stomach is unremarkable. 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 No pathologic dilatation of the large or small bowel loops to suggest bowel obstruction. Anastomotic suture line is identified within the lower rectum, image 123/4. Vascular/Lymphatic: Aortic atherosclerosis. No signs of abdominopelvic adenopathy. Reproductive: Prostate is unremarkable. Other: Soft tissue stranding and free fluid is noted extending along the right paracolic gutter towards the inferior margin of right lobe of liver, image 86/4. Within the presacral region there is a nonspecific fluid collection measuring 8.1 by 2.5 x 4.8 cm, image 118/4 and image 27/12. This is incompletely characterized without IV contrast. Within the anterior left pelvis there is a fat density area with internal soft tissue stranding with a thin peripheral soft tissue rim which likely represents an area of fat necrosis, image 111/4. Musculoskeletal: No acute osseous findings. No  suspicious bone lesions noted. IMPRESSION: 1. Mild interstitial edema with small bilateral pleural effusions. 2. Right-sided pelvocaliectasis and right hydroureter secondary to 3 mm distal right ureteral stone. 3. Nonspecific fluid collection within the presacral region. This is incompletely characterized without IV contrast. Although this could represent a benign postoperative fluid collection, abscess is not excluded. 4. Free fluid and fat stranding is noted extending along the right pericolic gutter. 5. Tiny nodule within the left apex is new from previous exam measuring 4 mm. 6. Aortic Atherosclerosis (ICD10-I70.0) and Emphysema (ICD10-J43.9). Electronically Signed   By: Kerby Moors M.D.   On: 01/03/2022 10:31   CT MAXILLOFACIAL WO CONTRAST  Result Date: 01/03/2022 CLINICAL DATA:  Sepsis with unknown source, severe dental caries, evaluate for dental abscess. EXAM: CT MAXILLOFACIAL WITHOUT CONTRAST TECHNIQUE: Multidetector CT imaging of the maxillofacial structures was performed. Multiplanar CT image reconstructions were also generated. RADIATION DOSE REDUCTION: This exam was performed according to the departmental dose-optimization program which includes automated exposure control, adjustment of the mA and/or kV according to patient size and/or use of iterative reconstruction technique. COMPARISON:  None Available. FINDINGS: Osseous: Dental caries with periapical lucency involving left central incisor and remaining premolar in the left mandible. No evidence of adjacent cellulitis. No gross abscess. Orbits: Negative Sinuses: No active sinusitis. Soft tissues: No evidence of acute inflammation. Limited intracranial: Negative IMPRESSION: 1. Dental caries and left mandibular periapical erosions. No evidence of odontogenic soft tissue infection or abscess. 2. Negative for sinusitis. Electronically Signed   By: Jorje Guild M.D.   On: 01/03/2022 05:44   DG Chest Port 1 View  Result Date:  01/03/2022 CLINICAL DATA:  Sepsis EXAM:  PORTABLE CHEST 1 VIEW COMPARISON:  None Available. FINDINGS: The heart size and mediastinal contours are within normal limits. Both lungs are clear. The visualized skeletal structures are unremarkable. Right chest wall Port-A-Cath tip in the lower SVC. IMPRESSION: No active disease. Electronically Signed   By: Ulyses Jarred M.D.   On: 01/03/2022 02:46    Assessment/Plan: 66 y/o male with severe sepsis s/p left ureteral stent placement 01/03/22 for obstructing 56m distal ureteral stone   -Follow up urine and blood cultures  -Foley per primary team  -Patient will need outpatient ureteroscopy and laser lithotripsy for definitive stone management. Please call urology when patient is close to discharge to ensure appropriate follow up has been arranged.  -Plan discussed with patient's son who is aware of longterm plan and temporary nature of ureteral stent   LOS: 1 day   KAldine Contes6/06/2022, 3:20 PM I reviewed history and physical examination of the patient and discussed the patients management with the resident.  I reviewed the resident's note and agree with the documented findings and plan of care.

## 2022-01-05 ENCOUNTER — Inpatient Hospital Stay (HOSPITAL_COMMUNITY): Payer: Self-pay

## 2022-01-05 ENCOUNTER — Encounter: Payer: Self-pay | Admitting: Hematology and Oncology

## 2022-01-05 DIAGNOSIS — R0603 Acute respiratory distress: Secondary | ICD-10-CM

## 2022-01-05 DIAGNOSIS — M7989 Other specified soft tissue disorders: Secondary | ICD-10-CM

## 2022-01-05 LAB — COMPREHENSIVE METABOLIC PANEL
ALT: 1020 U/L — ABNORMAL HIGH (ref 0–44)
AST: 1360 U/L — ABNORMAL HIGH (ref 15–41)
Albumin: 2.2 g/dL — ABNORMAL LOW (ref 3.5–5.0)
Alkaline Phosphatase: 65 U/L (ref 38–126)
Anion gap: 14 (ref 5–15)
BUN: 56 mg/dL — ABNORMAL HIGH (ref 8–23)
CO2: 16 mmol/L — ABNORMAL LOW (ref 22–32)
Calcium: 7.3 mg/dL — ABNORMAL LOW (ref 8.9–10.3)
Chloride: 104 mmol/L (ref 98–111)
Creatinine, Ser: 2.89 mg/dL — ABNORMAL HIGH (ref 0.61–1.24)
GFR, Estimated: 24 mL/min — ABNORMAL LOW (ref >60)
Glucose, Bld: 121 mg/dL — ABNORMAL HIGH (ref 70–99)
Potassium: 4.1 mmol/L (ref 3.5–5.1)
Sodium: 134 mmol/L — ABNORMAL LOW (ref 135–145)
Total Bilirubin: 2 mg/dL — ABNORMAL HIGH (ref 0.3–1.2)
Total Protein: 4.7 g/dL — ABNORMAL LOW (ref 6.5–8.1)

## 2022-01-05 LAB — GLUCOSE, CAPILLARY
Glucose-Capillary: 109 mg/dL — ABNORMAL HIGH (ref 70–99)
Glucose-Capillary: 119 mg/dL — ABNORMAL HIGH (ref 70–99)
Glucose-Capillary: 123 mg/dL — ABNORMAL HIGH (ref 70–99)
Glucose-Capillary: 143 mg/dL — ABNORMAL HIGH (ref 70–99)
Glucose-Capillary: 146 mg/dL — ABNORMAL HIGH (ref 70–99)
Glucose-Capillary: 182 mg/dL — ABNORMAL HIGH (ref 70–99)

## 2022-01-05 LAB — CULTURE, BLOOD (ROUTINE X 2): Special Requests: ADEQUATE

## 2022-01-05 LAB — URINE CULTURE: Culture: 40000 — AB

## 2022-01-05 LAB — ECHOCARDIOGRAM COMPLETE
Area-P 1/2: 3.11 cm2
Height: 70 in
S' Lateral: 3.7 cm
Weight: 3495.61 [oz_av]

## 2022-01-05 LAB — CBC
HCT: 24.8 % — ABNORMAL LOW (ref 39.0–52.0)
Hemoglobin: 8.6 g/dL — ABNORMAL LOW (ref 13.0–17.0)
MCH: 30 pg (ref 26.0–34.0)
MCHC: 34.7 g/dL (ref 30.0–36.0)
MCV: 86.4 fL (ref 80.0–100.0)
Platelets: 41 K/uL — ABNORMAL LOW (ref 150–400)
RBC: 2.87 MIL/uL — ABNORMAL LOW (ref 4.22–5.81)
RDW: 14.8 % (ref 11.5–15.5)
WBC: 9.5 K/uL (ref 4.0–10.5)
nRBC: 0 % (ref 0.0–0.2)

## 2022-01-05 LAB — LACTIC ACID, PLASMA: Lactic Acid, Venous: 3.1 mmol/L (ref 0.5–1.9)

## 2022-01-05 LAB — MAGNESIUM
Magnesium: 2.2 mg/dL (ref 1.7–2.4)
Magnesium: 2.2 mg/dL (ref 1.7–2.4)

## 2022-01-05 LAB — PHOSPHORUS
Phosphorus: 4.5 mg/dL (ref 2.5–4.6)
Phosphorus: 3.1 mg/dL (ref 2.5–4.6)

## 2022-01-05 MED ORDER — PERFLUTREN LIPID MICROSPHERE
1.0000 mL | INTRAVENOUS | Status: AC | PRN
  Administered 2022-01-05: 2 mL via INTRAVENOUS

## 2022-01-05 MED ORDER — CHLORHEXIDINE GLUCONATE 0.12 % MT SOLN
15.0000 mL | Freq: Two times a day (BID) | OROMUCOSAL | Status: DC
  Administered 2022-01-05 – 2022-01-11 (×9): 15 mL via OROMUCOSAL
  Filled 2022-01-05 (×8): qty 15

## 2022-01-05 MED ORDER — FUROSEMIDE 10 MG/ML IJ SOLN
20.0000 mg | Freq: Once | INTRAMUSCULAR | Status: AC
  Administered 2022-01-05: 20 mg via INTRAVENOUS
  Filled 2022-01-05: qty 2

## 2022-01-05 MED ORDER — ORAL CARE MOUTH RINSE
15.0000 mL | Freq: Two times a day (BID) | OROMUCOSAL | Status: DC
  Administered 2022-01-06 – 2022-01-11 (×9): 15 mL via OROMUCOSAL

## 2022-01-05 NOTE — Progress Notes (Signed)
PCCM note  Patient has been weaning all day on PSV 5/5.  Sedation has been held and he is awake, following commands Orders placed for extubation.  Marshell Garfinkel MD [facility] 01/05/2022, 3:33 PM

## 2022-01-05 NOTE — Progress Notes (Addendum)
NAMEJermayne Sweeney, MRN:  [id number], DOB:  02/06/1956, LOS: 2 ADMISSION DATE:  01/03/2022, CONSULTATION DATE: 01/03/2022 REFERRING MD: Sedonia Small- EDP, CHIEF COMPLAINT: Septic shock  History of Present Illness:  [name] is a 66 year old gentleman with a history of rectosigmoid cancer status post resection who completed chemotherapy in December 2022 who presents with 2 days of chills and vomiting.  He did not have a fever at home.  He denies abdominal pain, change in stool/ostomy output, cough, sputum production, significant upper respiratory symptoms, wounds, new rashes.  He has chronic skin breakdown that is unchanged around his ostomy site.  He has a history of dental caries that has been more significant since chemotherapy.  He has not seen a dentist recently.  No recent dental changes. Per his son he has been breathing heavily and has been drifting in and out of sleep.  Upon arrival to the ED he was febrile to 101.7.  He was hypotensive requiring initiation of norepinephrine.  CVC placed in the ED.  Pertinent  Medical History  Rectal cancer, status post resection and chemotherapy  Significant Hospital Events: Including procedures, antibiotic start and stop dates in addition to other pertinent events   6/11 admission-started on nor epi, cefepime, Vanco in the ED. Underwent right ureteral stent placement.  Surgery consulted for fluid collection in the presacral area.  Intubated and on pressors 6/12-blood and urine cultures growing E. Coli 6/13- off pressors. On PSV weans  Interim History / Subjective:   He is currently off pressors. On pressure support 5/5  Objective   Blood pressure (!) 110/51, pulse (!) 58, temperature 99.5 F (37.5 C), resp. rate (!) 23, height '5\' 10"'$  (1.778 m), weight 99.1 kg, SpO2 99 %.    Vent Mode: PSV;CPAP FiO2 (%):  [30 %-40 %] 30 % Set Rate:  [24 bmp] 24 bmp Vt Set:  [580 mL] 580 mL PEEP:  [5 cmH20] 5 cmH20 Pressure Support:  [5 cmH20] 5 cmH20 Plateau  Pressure:  [17 cmH20] 17 cmH20   Intake/Output Summary (Last 24 hours) at 01/05/2022 0847 Last data filed at 01/05/2022 0800 Gross per 24 hour  Intake 4897.65 ml  Output 1000 ml  Net 3897.65 ml   Filed Weights   01/03/22 0532 01/04/22 0500 01/05/22 0423  Weight: 88.1 kg 97.1 kg 99.1 kg    Examination: Gen:      No acute distress HEENT:  EOMI, sclera anicteric, ETT Neck:     No masses; no thyromegaly Lungs:    Clear to auscultation bilaterally; normal respiratory effort CV:         Regular rate and rhythm; no murmurs Abd:      + bowel sounds; soft, non-tender; no palpable masses, no distension Ext:    No edema; adequate peripheral perfusion Skin:      Warm and dry; no rash Neuro: Sedated  Labs/imaging reviewed Sodium 134, creatinine 2.89 AST elevated to 1360 , ALT 1020, total bilirubin 2.0 Platelets 41 No new imaging  Resolved Hospital Problem list     Assessment & Plan:  Septic shock secondary to E. coli bacteremia and urosepsis. Present on admission Appreciate input from surgery who feels that the pre sacral fluid collection is likely not a big contributor Underwent ureteral stent placement Follow lactic acid Narrowed antibiotics to ceftriaxone on 6/13 Off pressors  Acute resp failure Weaning on PSV. May be ready for extubation today  AKI due to sepsis Monitor urine output and creatinine  History of rectosigmoid cancer with ostomy  bag Routine ostomy care  Thrombocytopenia, elevated INR due to sepsis No in DIC as fibrinogen is high Elevated d dimer is concerning but suspicion for PE is low as he is on minimal vent setting Get doppler for DVT and echo to eval Rv function 4T score is low but will check SRA for completion sake  Elevated LFTs > worse today Likely due to shock liver. Check RUQ [place]  Best Practice (right click and "Reselect all SmartList Selections" daily)   Diet/type: tubefeeds DVT prophylaxis: SCD. Heparin on hold due to thrombocytopenia GI  prophylaxis: N/A Lines: Central line-femoral line, has port a catheter indwelling, not accessed Foley:  Yes, and it is still needed Code Status:  full code Last date of multidisciplinary goals of care discussion '[ ]'$   Critical care time:    The patient is critically ill with multiple organ system failure and requires high complexity decision making for assessment and support, frequent evaluation and titration of therapies, advanced monitoring, review of radiographic studies and interpretation of complex data.   Critical Care Time devoted to patient care services, exclusive of separately billable procedures, described in this note is 35 minutes.   Marshell Garfinkel MD [facility] See Amion for pager  If no response to pager , please call [phone] until 7pm After 7:00 pm call Elink  [phone] 01/05/2022, 8:47 AM

## 2022-01-05 NOTE — Procedures (Signed)
Extubation Procedure Note  Patient Details:   Name: Curtis [name] DOB: April 11, 1956 MRN: [id number]   Airway Documentation:    Vent end date: 01/05/22 Vent end time: 1610   Evaluation  O2 sats: stable throughout Complications: No apparent complications Patient did tolerate procedure well. Bilateral Breath Sounds: Clear   Yes  Patient extubated per order to 4L [place] with no apparent complications. Positive cuff leak was noted prior to extubation. Patient is alert and oriented and is able to speak. Vitals are stable. RT will continue to monitor   Tangie Stay Clyda Greener 01/05/2022, 4:12 PM

## 2022-01-05 NOTE — Progress Notes (Signed)
Bilateral upper extremity and bilateral lower extremity venous duplex has been completed. Preliminary results can be found in CV Proc through chart review.   01/05/22 12:14 PM Curtis [name] RVT

## 2022-01-06 ENCOUNTER — Inpatient Hospital Stay (HOSPITAL_COMMUNITY): Payer: Self-pay

## 2022-01-06 LAB — COMPREHENSIVE METABOLIC PANEL
ALT: 828 U/L — ABNORMAL HIGH (ref 0–44)
AST: 525 U/L — ABNORMAL HIGH (ref 15–41)
Albumin: 2.3 g/dL — ABNORMAL LOW (ref 3.5–5.0)
Alkaline Phosphatase: 167 U/L — ABNORMAL HIGH (ref 38–126)
Anion gap: 10 (ref 5–15)
BUN: 60 mg/dL — ABNORMAL HIGH (ref 8–23)
CO2: 21 mmol/L — ABNORMAL LOW (ref 22–32)
Calcium: 7.7 mg/dL — ABNORMAL LOW (ref 8.9–10.3)
Chloride: 108 mmol/L (ref 98–111)
Creatinine, Ser: 2.72 mg/dL — ABNORMAL HIGH (ref 0.61–1.24)
GFR, Estimated: 25 mL/min — ABNORMAL LOW (ref >60)
Glucose, Bld: 101 mg/dL — ABNORMAL HIGH (ref 70–99)
Potassium: 3.2 mmol/L — ABNORMAL LOW (ref 3.5–5.1)
Sodium: 139 mmol/L (ref 135–145)
Total Bilirubin: 3.2 mg/dL — ABNORMAL HIGH (ref 0.3–1.2)
Total Protein: 5.1 g/dL — ABNORMAL LOW (ref 6.5–8.1)

## 2022-01-06 LAB — CBC
HCT: 25.7 % — ABNORMAL LOW (ref 39.0–52.0)
Hemoglobin: 8.6 g/dL — ABNORMAL LOW (ref 13.0–17.0)
MCH: 29.2 pg (ref 26.0–34.0)
MCHC: 33.5 g/dL (ref 30.0–36.0)
MCV: 87.1 fL (ref 80.0–100.0)
Platelets: 48 10*3/uL — ABNORMAL LOW (ref 150–400)
RBC: 2.95 MIL/uL — ABNORMAL LOW (ref 4.22–5.81)
RDW: 15.1 % (ref 11.5–15.5)
WBC: 16.2 10*3/uL — ABNORMAL HIGH (ref 4.0–10.5)
nRBC: 0.1 % (ref 0.0–0.2)

## 2022-01-06 LAB — GLUCOSE, CAPILLARY
Glucose-Capillary: 103 mg/dL — ABNORMAL HIGH (ref 70–99)
Glucose-Capillary: 87 mg/dL (ref 70–99)
Glucose-Capillary: 96 mg/dL (ref 70–99)
Glucose-Capillary: 83 mg/dL (ref 70–99)

## 2022-01-06 LAB — LIPASE, BLOOD: Lipase: 42 U/L (ref 11–51)

## 2022-01-06 LAB — LACTIC ACID, PLASMA: Lactic Acid, Venous: 1.8 mmol/L (ref 0.5–1.9)

## 2022-01-06 MED ORDER — POTASSIUM CHLORIDE 20 MEQ PO PACK
20.0000 meq | PACK | Freq: Once | ORAL | Status: AC
Start: 2022-01-06 — End: 2022-01-06
  Administered 2022-01-06: 20 meq via ORAL
  Filled 2022-01-06: qty 1

## 2022-01-06 MED ORDER — POTASSIUM CHLORIDE 20 MEQ PO PACK
20.0000 meq | PACK | Freq: Once | ORAL | Status: DC
Start: 2022-01-06 — End: 2022-01-06

## 2022-01-06 MED ORDER — FENTANYL CITRATE (PF) 100 MCG/2ML IJ SOLN
INTRAMUSCULAR | Status: AC
  Administered 2022-01-06: 50 ug via INTRAVENOUS
  Filled 2022-01-06: qty 2

## 2022-01-06 MED ORDER — FENTANYL CITRATE (PF) 100 MCG/2ML IJ SOLN: 50.0000 ug | Freq: Once | INTRAMUSCULAR | Status: AC

## 2022-01-06 MED ORDER — ADULT MULTIVITAMIN W/MINERALS CH
1.0000 | ORAL_TABLET | Freq: Every day | ORAL | Status: DC
  Administered 2022-01-06 – 2022-01-11 (×6): 1 via ORAL
  Filled 2022-01-06 (×6): qty 1

## 2022-01-06 MED ORDER — CEFAZOLIN SODIUM-DEXTROSE 2-4 GM/100ML-% IV SOLN
2.0000 g | Freq: Three times a day (TID) | INTRAVENOUS | Status: DC
  Administered 2022-01-06 – 2022-01-07 (×3): 2 g via INTRAVENOUS
  Filled 2022-01-06 (×4): qty 100

## 2022-01-06 NOTE — Progress Notes (Signed)
NAMEJoeseph Verville, MRN:  [id number], DOB:  1956-01-27, LOS: 3 ADMISSION DATE:  01/03/2022, CONSULTATION DATE: 01/03/2022 REFERRING MD: Sedonia Small- EDP, CHIEF COMPLAINT: Septic shock  History of Present Illness:  [name] is a 66 year old gentleman with a history of rectosigmoid cancer status post resection who completed chemotherapy in December 2022 who presents with 2 days of chills and vomiting.  He did not have a fever at home.  He denies abdominal pain, change in stool/ostomy output, cough, sputum production, significant upper respiratory symptoms, wounds, new rashes.  He has chronic skin breakdown that is unchanged around his ostomy site.  He has a history of dental caries that has been more significant since chemotherapy.  He has not seen a dentist recently.  No recent dental changes. Per his son he has been breathing heavily and has been drifting in and out of sleep.  Upon arrival to the ED he was febrile to 101.7.  He was hypotensive requiring initiation of norepinephrine.  CVC placed in the ED.  Pertinent  Medical History  Rectal cancer, status post resection and chemotherapy  Significant Hospital Events: Including procedures, antibiotic start and stop dates in addition to other pertinent events   6/11 admission-started on nor epi, cefepime, Vanco in the ED. Underwent right ureteral stent placement.  Surgery consulted for fluid collection in the presacral area.  Intubated and on pressors 6/12-blood and urine cultures growing E. Coli 6/13- off pressors. On PSV weans, extubated 6/13 UE duplex neg for DVT. Echo EF 45-50%  Interim History / Subjective:  Stable after extubation yesterday.  Objective   Blood pressure 123/60, pulse 79, temperature 98.2 F (36.8 C), temperature source Oral, resp. rate (!) 22, height '5\' 10"'$  (1.778 m), weight 98 kg, SpO2 96 %.    Vent Mode: PSV;CPAP FiO2 (%):  [30 %-36 %] 36 % PEEP:  [5 cmH20] 5 cmH20 Pressure Support:  [5 cmH20] 5 cmH20   Intake/Output  Summary (Last 24 hours) at 01/06/2022 0849 Last data filed at 01/06/2022 0400 Gross per 24 hour  Intake 379.97 ml  Output 2450 ml  Net -2070.03 ml    Filed Weights   01/04/22 0500 01/05/22 0423 01/06/22 0400  Weight: 97.1 kg 99.1 kg 98 kg    Examination: General: Adult male, resting in bed, in NAD. Neuro: A&O x 3, no deficits. HEENT: [place]/AT. Sclerae anicteric. EOMI. Cardiovascular: RRR, no M/R/G.  Lungs: Respirations even and unlabored.  CTA bilaterally, No W/R/R. Abdomen: BS x 4, soft, NT/ND.  Musculoskeletal: No gross deformities, no edema.  Skin: Intact, warm, no rashes.  Labs/imaging reviewed Sodium 134, creatinine 2.89 AST elevated to 1360 , ALT 1020, total bilirubin 2.0 Platelets 41 No new imaging    Assessment & Plan:   Septic shock secondary to E. coli bacteremia and urosepsis. Present on admission - s/p ureteral stent placement. Appreciate input from surgery who feels that the pre sacral fluid collection is likely not a big contributor. Narrow CTX to Cefazolin today 6/14. Continue supportive care.  Acute resp failure - resolved, s/p extubation 6/13. Bronchial hygiene. Mobilize.  AKI due to sepsis - gradually improving. Hypokalemia. 20 mEq K. Monitor BMP and UOP.  History of rectosigmoid cancer with ostomy bag Routine ostomy care.  Thrombocytopenia, elevated INR due to sepsis. No in DIC as fibrinogen is high. Elevated d dimer is concerning but suspicion for PE is low and echo reassuring. LE duplex also negative. 4T score is low but will check SRA for completion sake  Elevated LFTs -  improved. Liver US negative. Trend LFT's intermittently.  Nutrition. SLP eval.   Stable to transfer out of ICU. Will ask TRH to assume care in AM 6/15 with PCCM off.   Best Practice (right click and "Reselect all SmartList Selections" daily)   Diet/type: NPO - SLP eval today. DVT prophylaxis: SCD. Heparin on hold due to thrombocytopenia GI prophylaxis: N/A Lines:  Central line-femoral line, has port a catheter indwelling, not accessed Foley:  Yes, and it is still needed Code Status:  full code Last date of multidisciplinary goals of care discussion  Critical care time: N/A.    Montey Hora, [facility] For pager details, please see AMION or use Epic chat  After 1900, please call [place] for cross coverage needs 01/06/2022, 9:02 AM

## 2022-01-06 NOTE — Progress Notes (Signed)
Nutrition Follow-up  DOCUMENTATION CODES:   Not applicable  INTERVENTION:   - Magic Cup TID with meals, each supplement provides 290 kcal and 9 grams of protein  - MVI with minerals daily  - Encourage PO intake  NUTRITION DIAGNOSIS:   Inadequate oral intake related to inability to eat as evidenced by NPO status.  Progressing, pt now on Regular diet with thin liquids  GOAL:   Patient will meet greater than or equal to 90% of their needs  Progressing  MONITOR:   PO intake, Supplement acceptance, Labs, Weight trends, I & O's  REASON FOR ASSESSMENT:   Ventilator, Consult Enteral/tube feeding initiation and management  ASSESSMENT:   66 year old male who presented to the ED on 6/11 after being found unresponsive by family. 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 Pt admitted with septic shock secondary to E. coli bacteremia, AKI, R distal ureteral stone with mild upstream hydronephrosis.  06/11 - s/p cystoscopy, R ureteral stent placement, and R retrograde pyelography with interpretation 06/13 - extubated  Discussed pt with SLP. Diet advanced to regular with thin liquids today.  Attempted to speak with pt at bedside. However, RN and NT in room about to start a bath.  RD to order oral nutrition supplements to maximize kcal and protein intake. Will also order daily MVI with minerals.  Current weight: 88.1 kg Current weight: 98 kg  Medications reviewed and include: IV abx  Labs reviewed: potassium 3.2 (pt received klor-con 20 mEq x 1), BUN 60, creatinine 2.72, elevated LFTs (trending down), WBC 16.2, hemoglobin 8.6, platelets 48 CBG's: 87-182 x 24 hours  UOP: 1725 ml x 24 hours Ileostomy: 725 ml x 24 hours I/O's: +11.4 L since admit  Diet Order:   Diet Order             Diet regular Room service appropriate? Yes; Fluid consistency: Thin  Diet effective now                    EDUCATION NEEDS:   No education needs have been identified at this time  Skin:  Skin Assessment: Reviewed RN Assessment (incision to perineum)  Last BM:  01/06/22 ileostomy  Height:   Ht Readings from Last 1 Encounters:  01/03/22 '5\' 10"'$  (1.778 m)    Weight:   Wt Readings from Last 1 Encounters:  01/06/22 98 kg    BMI:  Body mass index is 31 kg/m.  Estimated Nutritional Needs:   Kcal:  2100-2300  Protein:  115-130 grams  Fluid:  2.0 L    Gustavus Bryant, MS, RD, LDN Inpatient Clinical Dietitian Please see AMiON for contact information.

## 2022-01-06 NOTE — Progress Notes (Addendum)
eLink Physician-Brief Progress Note Patient Name: Curtis [name] DOB: [birth year] MRN: [id number]   Date of Service  01/06/2022  HPI/Events of Note  Patient c/o abdominal pain. Abdominal film 01/03/2022 without evidence of free air or distended bowl. BP = 140/55 with MAP = 79. CBC and CMP already ordered for 5 AM.  eICU Interventions  Plan: Repeat Abdominal film now. Lactic Acid and Lipase at 5 AM. Fentanyl 50 mcg IV X 1.     Intervention Category Major Interventions: Other:  Lysle Dingwall 01/06/2022, 3:27 AM

## 2022-01-06 NOTE — Evaluation (Signed)
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[place])    Past Surgical History:  Past Surgical History:  Procedure Laterality Date   BIOPSY  01/21/2021   Procedure: BIOPSY;  Surgeon: Mauri Pole, MD;  Location: [facility];  Service: Endoscopy;;   COLON RESECTION N/A 01/21/2021   Procedure: LAPAROSCOPIC ASSISTED LOOP COLOSTOMY;  Surgeon: Donnie Mesa, MD;  Location: [facility];  Service: General;  Laterality: N/A;   CYSTOSCOPY W/ URETERAL STENT PLACEMENT Right 01/03/2022   Procedure: CYSTOSCOPY WITH  URETERAL STENT PLACEMENT;  Surgeon: Remi Haggard, MD;  Location: [facility];  Service: Urology;  Laterality: Right;   FLEXIBLE SIGMOIDOSCOPY N/A 01/21/2021   Procedure: FLEXIBLE SIGMOIDOSCOPY;  Surgeon: Mauri Pole, MD;  Location: [facility];  Service: Endoscopy;  Laterality: N/A;   IR IMAGING GUIDED PORT INSERTION  03/04/2021   SUBMUCOSAL TATTOO INJECTION  01/21/2021   Procedure: SUBMUCOSAL TATTOO INJECTION;  Surgeon: Mauri Pole, MD;  Location: [facility];  Service: Endoscopy;;   HPI:  66 year old gentleman with a history of rectosigmoid cancer status post resection who completed chemotherapy in December 2022 who presents with 2 days of chills and vomiting. Per chart history of dental caries that has been more significant since chemotherapy. MD notes state per his son he has been breathing heavily and has been drifting in and out of sleep. Found to be in septic shock due to E. coli bacteremia and urosepsis and underwent stent placement 6/11. Intubated 6/11-6/13.    Assessment / Plan / Recommendation  Clinical Impression  Pt adequately awake but slightly groggy during  assessment. He has upper and lower partials with observable and history of dental caries from chemotherapy. Upper partial cleaned and pt donned. Straw sips thin were coordinated with respirations without signs aspiration.Appetite decreased but pt agreeable to small piece cracker which he managed and propelled nicely to initiate a subjectively timely swallow. No residue. He denies odonophagia, vocal quality clear with weak volitional cough. Recommend regular texture, thin liquids, pills with thin, set up assist and possible feeding assist with meals. No further ST is needed. SLP Visit Diagnosis: Dysphagia, unspecified (R13.10)    Aspiration Risk  Mild aspiration risk    Diet Recommendation Regular;Thin liquid   Liquid Administration via: Cup;Straw Medication Administration: Whole meds with liquid Supervision: Staff to assist with self feeding (set up) Compensations: Small sips/bites;Slow rate Postural Changes: Seated upright at 90 degrees    Other  Recommendations Oral Care Recommendations: Oral care BID    Recommendations for follow up therapy are one component of a multi-disciplinary discharge planning process, led by the attending physician.  Recommendations may be updated based on patient status, additional functional criteria and insurance authorization.  Follow up Recommendations No SLP follow up      Assistance Recommended at Discharge    Functional Status Assessment    Frequency and Duration            Prognosis        Swallow Study   General HPI: 65 year old gentleman with a history of rectosigmoid cancer status post resection who completed chemotherapy in December 2022 who presents with 2 days of chills and vomiting. Per chart history of dental caries that has been more significant since  chemotherapy. MD notes state per his son he has been breathing heavily and has been drifting in and out of sleep. Found to be in septic shock due to E. coli bacteremia and urosepsis and  underwent stent placement 6/11. Intubated 6/11-6/13. Type of Study: Bedside Swallow Evaluation Previous Swallow Assessment:  (no) Diet Prior to this Study: NPO Temperature Spikes Noted: No Respiratory Status: Nasal cannula History of Recent Intubation: Yes Length of Intubations (days): 2 days Date extubated: 01/05/22 Behavior/Cognition: Alert;Cooperative;Pleasant mood Oral Cavity Assessment:  (? candidia, mild lingual bleeding) Oral Care Completed by SLP: Yes Oral Cavity - Dentition: Other (Comment) (upper/lower partial) Vision: Functional for self-feeding Self-Feeding Abilities: Needs assist;Able to feed self Patient Positioning: Upright in bed Baseline Vocal Quality: Normal Volitional Cough: Weak Volitional Swallow: Able to elicit    Oral/Motor/Sensory Function Overall Oral Motor/Sensory Function: Within functional limits   Ice Chips Ice chips: Not tested   Thin Liquid Thin Liquid: Within functional limits Presentation: Straw    Nectar Thick Nectar Thick Liquid: Not tested   Honey Thick Honey Thick Liquid: Not tested   Puree Puree: Within functional limits   Solid     Solid: Within functional limits      [name] 01/06/2022,11:42 AM

## 2022-01-06 NOTE — Progress Notes (Signed)
[facility] Progress Note Patient Name: Davyon Fisch DOB: [birth year] MRN: [id number]   Date of Service  01/06/2022  HPI/Events of Note  A-line waveform damped. Patient no longer on vasopressors. Nursing request to remove A-line.  eICU Interventions  Plan: D/C A-line.      Intervention Category Major Interventions: Other:  Lysle Dingwall 01/06/2022, 5:08 AM

## 2022-01-07 ENCOUNTER — Inpatient Hospital Stay (HOSPITAL_COMMUNITY): Payer: Self-pay

## 2022-01-07 DIAGNOSIS — R7401 Elevation of levels of liver transaminase levels: Secondary | ICD-10-CM

## 2022-01-07 DIAGNOSIS — M79674 Pain in right toe(s): Secondary | ICD-10-CM

## 2022-01-07 DIAGNOSIS — I739 Peripheral vascular disease, unspecified: Secondary | ICD-10-CM

## 2022-01-07 DIAGNOSIS — M79675 Pain in left toe(s): Secondary | ICD-10-CM

## 2022-01-07 DIAGNOSIS — D696 Thrombocytopenia, unspecified: Secondary | ICD-10-CM

## 2022-01-07 LAB — BASIC METABOLIC PANEL
Anion gap: 12 (ref 5–15)
BUN: 51 mg/dL — ABNORMAL HIGH (ref 8–23)
CO2: 22 mmol/L (ref 22–32)
Calcium: 8 mg/dL — ABNORMAL LOW (ref 8.9–10.3)
Chloride: 109 mmol/L (ref 98–111)
Creatinine, Ser: 2.05 mg/dL — ABNORMAL HIGH (ref 0.61–1.24)
GFR, Estimated: 36 mL/min — ABNORMAL LOW (ref >60)
Glucose, Bld: 95 mg/dL (ref 70–99)
Potassium: 2.9 mmol/L — ABNORMAL LOW (ref 3.5–5.1)
Sodium: 143 mmol/L (ref 135–145)

## 2022-01-07 LAB — GLUCOSE, CAPILLARY
Glucose-Capillary: 108 mg/dL — ABNORMAL HIGH (ref 70–99)
Glucose-Capillary: 126 mg/dL — ABNORMAL HIGH (ref 70–99)
Glucose-Capillary: 134 mg/dL — ABNORMAL HIGH (ref 70–99)
Glucose-Capillary: 144 mg/dL — ABNORMAL HIGH (ref 70–99)
Glucose-Capillary: 96 mg/dL (ref 70–99)
Glucose-Capillary: 97 mg/dL (ref 70–99)

## 2022-01-07 LAB — COMPREHENSIVE METABOLIC PANEL
ALT: 390 U/L — ABNORMAL HIGH (ref 0–44)
AST: 296 U/L — ABNORMAL HIGH (ref 15–41)
Albumin: 2.3 g/dL — ABNORMAL LOW (ref 3.5–5.0)
Alkaline Phosphatase: 237 U/L — ABNORMAL HIGH (ref 38–126)
Anion gap: 10 (ref 5–15)
BUN: 48 mg/dL — ABNORMAL HIGH (ref 8–23)
CO2: 23 mmol/L (ref 22–32)
Calcium: 8 mg/dL — ABNORMAL LOW (ref 8.9–10.3)
Chloride: 111 mmol/L (ref 98–111)
Creatinine, Ser: 1.88 mg/dL — ABNORMAL HIGH (ref 0.61–1.24)
GFR, Estimated: 39 mL/min — ABNORMAL LOW (ref >60)
Glucose, Bld: 111 mg/dL — ABNORMAL HIGH (ref 70–99)
Potassium: 3.2 mmol/L — ABNORMAL LOW (ref 3.5–5.1)
Sodium: 144 mmol/L (ref 135–145)
Total Bilirubin: 3.8 mg/dL — ABNORMAL HIGH (ref 0.3–1.2)
Total Protein: 5.2 g/dL — ABNORMAL LOW (ref 6.5–8.1)

## 2022-01-07 LAB — CBC WITH DIFFERENTIAL/PLATELET
Abs Immature Granulocytes: 0.26 10*3/uL — ABNORMAL HIGH (ref 0.00–0.07)
Band Neutrophils: 0 %
Basophils Absolute: 0 10*3/uL (ref 0.0–0.1)
Basophils Relative: 0 %
Blasts: 0 %
Eosinophils Absolute: 0.3 10*3/uL (ref 0.0–0.5)
Eosinophils Relative: 1 %
HCT: 27 % — ABNORMAL LOW (ref 39.0–52.0)
Hemoglobin: 8.9 g/dL — ABNORMAL LOW (ref 13.0–17.0)
Lymphocytes Relative: 3 %
Lymphs Abs: 0.8 10*3/uL (ref 0.7–4.0)
MCH: 28.9 pg (ref 26.0–34.0)
MCHC: 33 g/dL (ref 30.0–36.0)
MCV: 87.7 fL (ref 80.0–100.0)
Metamyelocytes Relative: 0 %
Monocytes Absolute: 1 10*3/uL (ref 0.1–1.0)
Monocytes Relative: 4 %
Myelocytes: 1 %
Neutro Abs: 23.4 10*3/uL — ABNORMAL HIGH (ref 1.7–7.7)
Neutrophils Relative %: 91 %
Other: 0 %
Platelets: 69 10*3/uL — ABNORMAL LOW (ref 150–400)
Promyelocytes Relative: 0 %
RBC: 3.08 MIL/uL — ABNORMAL LOW (ref 4.22–5.81)
RDW: 15.5 % (ref 11.5–15.5)
WBC: 25.8 10*3/uL — ABNORMAL HIGH (ref 4.0–10.5)
nRBC: 0 /100 WBC
nRBC: 0.5 % — ABNORMAL HIGH (ref 0.0–0.2)

## 2022-01-07 LAB — CBC
HCT: 25.8 % — ABNORMAL LOW (ref 39.0–52.0)
Hemoglobin: 8.6 g/dL — ABNORMAL LOW (ref 13.0–17.0)
MCH: 29.2 pg (ref 26.0–34.0)
MCHC: 33.3 g/dL (ref 30.0–36.0)
MCV: 87.5 fL (ref 80.0–100.0)
Platelets: 57 10*3/uL — ABNORMAL LOW (ref 150–400)
RBC: 2.95 MIL/uL — ABNORMAL LOW (ref 4.22–5.81)
RDW: 15.5 % (ref 11.5–15.5)
WBC: 23.8 10*3/uL — ABNORMAL HIGH (ref 4.0–10.5)
nRBC: 0.3 % — ABNORMAL HIGH (ref 0.0–0.2)

## 2022-01-07 LAB — PROTIME-INR
INR: 1.4 — ABNORMAL HIGH (ref 0.8–1.2)
Prothrombin Time: 16.6 seconds — ABNORMAL HIGH (ref 11.4–15.2)

## 2022-01-07 LAB — PROCALCITONIN: Procalcitonin: 59.17 ng/mL

## 2022-01-07 LAB — MAGNESIUM: Magnesium: 2.1 mg/dL (ref 1.7–2.4)

## 2022-01-07 LAB — PHOSPHORUS: Phosphorus: 2.3 mg/dL — ABNORMAL LOW (ref 2.5–4.6)

## 2022-01-07 MED ORDER — DIPHENHYDRAMINE HCL 50 MG/ML IJ SOLN
12.5000 mg | Freq: Three times a day (TID) | INTRAMUSCULAR | Status: DC | PRN
  Administered 2022-01-07 – 2022-01-08 (×4): 12.5 mg via INTRAVENOUS
  Filled 2022-01-07 (×4): qty 1

## 2022-01-07 MED ORDER — SODIUM CHLORIDE 0.9 % IV SOLN
2.0000 g | Freq: Three times a day (TID) | INTRAVENOUS | Status: DC
  Administered 2022-01-07 (×2): 2 g via INTRAVENOUS
  Filled 2022-01-07 (×2): qty 12.5

## 2022-01-07 MED ORDER — POTASSIUM CHLORIDE CRYS ER 20 MEQ PO TBCR
40.0000 meq | EXTENDED_RELEASE_TABLET | Freq: Four times a day (QID) | ORAL | Status: AC
  Administered 2022-01-07 (×2): 40 meq via ORAL
  Filled 2022-01-07 (×2): qty 2

## 2022-01-07 MED ORDER — POTASSIUM PHOSPHATES 15 MMOLE/5ML IV SOLN
30.0000 mmol | Freq: Once | INTRAVENOUS | Status: AC
Start: 2022-01-07 — End: 2022-01-08
  Administered 2022-01-07: 30 mmol via INTRAVENOUS
  Filled 2022-01-07: qty 10

## 2022-01-07 MED ORDER — SODIUM CHLORIDE 0.9 % IV SOLN
2.0000 g | Freq: Two times a day (BID) | INTRAVENOUS | Status: DC
  Administered 2022-01-08: 2 g via INTRAVENOUS
  Filled 2022-01-07: qty 12.5

## 2022-01-07 NOTE — Plan of Care (Signed)
  Problem: Clinical Measurements: Goal: Will remain free from infection Outcome: Progressing Goal: Diagnostic test results will improve Outcome: Progressing Goal: Respiratory complications will improve Outcome: Progressing Goal: Cardiovascular complication will be avoided Outcome: Progressing   Problem: Clinical Measurements: Goal: Diagnostic test results will improve Outcome: Progressing   Problem: Clinical Measurements: Goal: Respiratory complications will improve Outcome: Progressing   Problem: Clinical Measurements: Goal: Cardiovascular complication will be avoided Outcome: Progressing   Problem: Nutrition: Goal: Adequate nutrition will be maintained Outcome: Progressing   Problem: Pain Managment: Goal: General experience of comfort will improve Outcome: Progressing

## 2022-01-07 NOTE — Plan of Care (Signed)

## 2022-01-07 NOTE — Progress Notes (Signed)
PROGRESS NOTE    Curtis [name]  RPR:[id number] DOB: [birth year] DOA: 01/03/2022 PCP: Pcp, No   Chief Complaint  Patient presents with   Unresponsive   Altered Mental Status    Brief Narrative:   Curtis [name] is a 66 year old gentleman with a history of rectosigmoid cancer status post resection who completed chemotherapy in December 2022 who presents with 2 days of chills and vomiting.  He did not have a fever at home.  He denies abdominal pain, change in stool/ostomy output, cough, sputum production, significant upper respiratory symptoms, wounds, new rashes.  He has chronic skin breakdown that is unchanged around his ostomy site.  He has a history of dental caries that has been more significant since chemotherapy.  He has not seen a dentist recently.  No recent dental changes. Per his son he has been breathing heavily and has been drifting in and out of sleep.  Upon arrival to the ED he was febrile to 101.7.  He was hypotensive requiring initiation of norepinephrine.  CVC placed in the ED. patient admitted to ICU  Significant events 6/11 admission-started on nor epi, cefepime, Vanco in the ED. Underwent right ureteral stent placement.  Surgery consulted for fluid collection in the presacral area.  Intubated and on pressors 6/12-blood and urine cultures growing E. Coli 6/13- off pressors. On PSV weans, extubated 6/13 UE duplex neg for DVT. Echo EF 45-50%    Assessment & Plan:   Principal Problem:   Septic shock ([place])   Septic shock secondary to E. coli bacteremia and urosepsis.  With infected right ureteral stone present on admission  - s/p ureteral stent placement by Dr. Milford Cage 6/11 - Appreciate input from surgery who feels that the pre sacral fluid collection is likely not a big contributor. -Initially on IV cefepime, blood culture significant for E. coli, sensitive to cefazolin, narrowed 6/15, he developed hives today, as well white blood cell count trending up, so we will change back to  cefepime. -White blood cell count trending up, will trend procalcitonin, will monitor on IV cefepime, if continues trending up likely will need repeat imaging.    Acute resp failure - resolved, s/p extubation 6/13. Bronchial hygiene. Mobilize. Wean oxygen as tolerated   AKI -Due to sepsis, hypotension -Trending down, avoid nephrotoxic medications  Hypokalemia. - Repleting   History of rectosigmoid cancer with ostomy bag - Routine ostomy care. -Patient with fluid collection, general surgery input greatly appreciated, at this point no need for sampling given septic work-up related to bacteremia and infected kidney stones -Leukocytosis continue trending up and then will need repeat imaging   Thrombocytopenia, elevated INR due to sepsis. -  No in DIC as fibrinogen is high. - Elevated d dimer is concerning but suspicion for PE is low and echo reassuring. LE duplex also negative. 4T score is low, SRA is pending, will check HIT   Elevated LFTs - improved. Liver US negative. Likely from shock liver due to septic shock  Hypokalemia/hypophosphatemia -Repleted  Left toes cyanosis -Dopplers negative for DVT, Patient  with good peripheral pulses, ABIs pending, requested vascular surgery consult   Nutrition. SLP eval.   DVT prophylaxis: Mechanical prophylaxis on hold due to thrombocytopenia) Code Status: Full Family Communication: None at [place] Disposition:   Status is: Inpatient    Consultants:  Urology  PCCM Vascular   Subjective:  Hives earlier today, with itching, complains of left foot pain as well.  Objective: Vitals:   01/07/22 0400 01/07/22 0500 01/07/22 0751 01/07/22 1204  BP: 139/64  132/74 (!) 147/74  Pulse: 80  76 76  Resp: '19  20 17  '$ Temp: 97.9 F (36.6 C)  97.7 F (36.5 C) 97.8 F (36.6 C)  TempSrc: Oral  Oral Oral  SpO2: 96%  96% 96%  Weight:  98.9 kg    Height:  '5\' 10"'$  (1.778 m)      Intake/Output Summary (Last 24 hours) at 01/07/2022  1347 Last data filed at 01/07/2022 0926 Gross per 24 hour  Intake 10 ml  Output 2000 ml  Net -1990 ml   Filed Weights   01/05/22 0423 01/06/22 0400 01/07/22 0500  Weight: 99.1 kg 98 kg 98.9 kg    Examination:  Awake Alert, Oriented X 3, No new F.N deficits, Normal affect, ill-appearing Symmetrical Chest wall movement, diminished air entry at the bases RRR,No Gallops,Rubs or new Murmurs, No Parasternal Heave +ve B.Sounds, Abd Soft, No tenderness, left abdomen ileostomy No Cyanosis, Clubbing or edema, left toe cyanosis, but has good peripheral pulses.     Data Reviewed: I have personally reviewed following labs and imaging studies  CBC: Recent Labs  Lab 01/03/22 0233 01/03/22 0234 01/03/22 0628 01/03/22 1342 01/04/22 0115 01/04/22 0233 01/04/22 1224 01/05/22 0339 01/06/22 0345 01/07/22 0407  WBC 8.4  --  15.6*  --   --  6.9  --  9.5 16.2* 23.8*  NEUTROABS 7.0  --   --   --   --   --   --   --   --   --   HGB 10.6*   < > 9.6*   < > 8.5* 9.1*  --  8.6* 8.6* 8.6*  HCT 33.0*   < > 28.6*   < > 25.0* 26.2*  --  24.8* 25.7* 25.8*  MCV 91.7  --  88.0  --   --  86.8  --  86.4 87.1 87.5  PLT PLATELET CLUMPS NOTED ON SMEAR, UNABLE TO ESTIMATE  --  117*  --   --  48* PLATELET CLUMPS NOTED ON SMEAR, UNABLE TO ESTIMATE 41* 48* 57*   < > = values in this interval not displayed.    Basic Metabolic Panel: Recent Labs  Lab 01/03/22 0628 01/03/22 1342 01/04/22 0108 01/04/22 0115 01/04/22 1224 01/04/22 1742 01/05/22 0339 01/05/22 1717 01/06/22 0345 01/07/22 0407  NA 136   < > 136 136  --   --  134*  --  139 143  K 3.4*   < > 4.4 4.3  --   --  4.1  --  3.2* 2.9*  CL 105  --  106  --   --   --  104  --  108 109  CO2 16*  --  17*  --   --   --  16*  --  21* 22  GLUCOSE 100*  --  89  --   --   --  121*  --  101* 95  BUN 33*  --  37*  --   --   --  56*  --  60* 51*  CREATININE 3.17*  --  2.78*  --   --   --  2.89*  --  2.72* 2.05*  CALCIUM 8.1*  --  7.5*  --   --   --  7.3*  --   7.7* 8.0*  MG 1.1*  --  2.2  --  2.3 2.4 2.2 2.2  --  2.1  PHOS 4.3  --   --   --  5.0* 4.9* 4.5 3.1  --  2.3*   < > = values in this interval not displayed.    GFR: Estimated Creatinine Clearance: 42.9 mL/min (A) (by C-G formula based on SCr of 2.05 mg/dL (H)).  Liver Function Tests: Recent Labs  Lab 01/03/22 0233 01/04/22 0108 01/05/22 0339 01/06/22 0345  AST 32 322* 1,360* 525*  ALT 22 211* 1,020* 828*  ALKPHOS 111 44 65 167*  BILITOT 1.0 1.3* 2.0* 3.2*  PROT 5.6* 5.0* 4.7* 5.1*  ALBUMIN 2.8* 2.5* 2.2* 2.3*    CBG: Recent Labs  Lab 01/06/22 1525 01/07/22 0011 01/07/22 0418 01/07/22 0814 01/07/22 1206  GLUCAP 83 97 96 108* 134*     Recent Results (from the past 240 hour(s))  Culture, blood (Routine x 2)     Status: Abnormal   Collection Time: 01/02/22  8:30 PM   Specimen: BLOOD RIGHT FOREARM  Result Value Ref Range Status   Specimen Description BLOOD RIGHT FOREARM  Final   Special Requests   Final    BOTTLES DRAWN AEROBIC AND ANAEROBIC Blood Culture results may not be optimal due to an inadequate volume of blood received in culture bottles   Culture  Setup Time   Final    GRAM NEGATIVE RODS IN BOTH AEROBIC AND ANAEROBIC BOTTLES CRITICAL VALUE NOTED.  VALUE IS CONSISTENT WITH PREVIOUSLY REPORTED AND CALLED VALUE. Performed at [facility], [place] [address], [place], [place] [address]    Culture ESCHERICHIA COLI (A)  Final   Report Status 01/05/2022 FINAL  Final  Culture, blood (Routine x 2)     Status: Abnormal   Collection Time: 01/03/22  2:35 AM   Specimen: BLOOD LEFT FOREARM  Result Value Ref Range Status   Specimen Description BLOOD LEFT FOREARM  Final   Special Requests   Final    BOTTLES DRAWN AEROBIC AND ANAEROBIC Blood Culture adequate volume   Culture  Setup Time   Final    GRAM NEGATIVE RODS IN BOTH AEROBIC AND ANAEROBIC BOTTLES CRITICAL RESULT CALLED TO, READ BACK BY AND VERIFIED WITH: P. DANG PHARMD, AT 1811 01/03/22 D. VANHOOK Performed  at [facility], [place] [address], [place], [place] [address]    Culture ESCHERICHIA COLI (A)  Final   Report Status 01/05/2022 FINAL  Final   Organism ID, Bacteria ESCHERICHIA COLI  Final      Susceptibility   Escherichia coli - MIC*    AMPICILLIN >=32 RESISTANT Resistant     CEFAZOLIN <=4 SENSITIVE Sensitive     CEFEPIME <=0.12 SENSITIVE Sensitive     CEFTAZIDIME <=1 SENSITIVE Sensitive     CEFTRIAXONE <=0.25 SENSITIVE Sensitive     CIPROFLOXACIN <=0.25 SENSITIVE Sensitive     GENTAMICIN <=1 SENSITIVE Sensitive     IMIPENEM <=0.25 SENSITIVE Sensitive     TRIMETH/SULFA >=320 RESISTANT Resistant     AMPICILLIN/SULBACTAM >=32 RESISTANT Resistant     PIP/TAZO >=128 RESISTANT Resistant     * ESCHERICHIA COLI  Blood Culture ID Panel (Reflexed)     Status: Abnormal   Collection Time: 01/03/22  2:35 AM  Result Value Ref Range Status   Enterococcus faecalis NOT DETECTED NOT DETECTED Final   Enterococcus Faecium NOT DETECTED NOT DETECTED Final   Listeria monocytogenes NOT DETECTED NOT DETECTED Final   Staphylococcus species NOT DETECTED NOT DETECTED Final   Staphylococcus aureus (BCID) NOT DETECTED NOT DETECTED Final   Staphylococcus epidermidis NOT DETECTED NOT DETECTED Final   Staphylococcus lugdunensis NOT DETECTED NOT DETECTED Final  Streptococcus species NOT DETECTED NOT DETECTED Final   Streptococcus agalactiae NOT DETECTED NOT DETECTED Final   Streptococcus pneumoniae NOT DETECTED NOT DETECTED Final   Streptococcus pyogenes NOT DETECTED NOT DETECTED Final   A.calcoaceticus-baumannii NOT DETECTED NOT DETECTED Final   Bacteroides fragilis NOT DETECTED NOT DETECTED Final   Enterobacterales DETECTED (A) NOT DETECTED Final    Comment: Enterobacterales represent a large order of gram negative bacteria, not a single organism. CRITICAL RESULT CALLED TO, READ BACK BY AND VERIFIED WITH: P. DANG PHARMD, AT 1811 01/03/22 D. VANHOOK    Enterobacter cloacae complex NOT DETECTED NOT  DETECTED Final   Escherichia coli DETECTED (A) NOT DETECTED Final    Comment: CRITICAL RESULT CALLED TO, READ BACK BY AND VERIFIED WITH: P. DANG PHARMD, AT 1811 01/03/22 D. VANHOOK    Klebsiella aerogenes NOT DETECTED NOT DETECTED Final   Klebsiella oxytoca NOT DETECTED NOT DETECTED Final   Klebsiella pneumoniae NOT DETECTED NOT DETECTED Final   Proteus species NOT DETECTED NOT DETECTED Final   Salmonella species NOT DETECTED NOT DETECTED Final   Serratia marcescens NOT DETECTED NOT DETECTED Final   Haemophilus influenzae NOT DETECTED NOT DETECTED Final   Neisseria meningitidis NOT DETECTED NOT DETECTED Final   Pseudomonas aeruginosa NOT DETECTED NOT DETECTED Final   Stenotrophomonas maltophilia NOT DETECTED NOT DETECTED Final   Candida albicans NOT DETECTED NOT DETECTED Final   Candida auris NOT DETECTED NOT DETECTED Final   Candida glabrata NOT DETECTED NOT DETECTED Final   Candida krusei NOT DETECTED NOT DETECTED Final   Candida parapsilosis NOT DETECTED NOT DETECTED Final   Candida tropicalis NOT DETECTED NOT DETECTED Final   Cryptococcus neoformans/gattii NOT DETECTED NOT DETECTED Final   CTX-M ESBL NOT DETECTED NOT DETECTED Final   Carbapenem resistance IMP NOT DETECTED NOT DETECTED Final   Carbapenem resistance KPC NOT DETECTED NOT DETECTED Final   Carbapenem resistance NDM NOT DETECTED NOT DETECTED Final   Carbapenem resist OXA 48 LIKE NOT DETECTED NOT DETECTED Final   Carbapenem resistance VIM NOT DETECTED NOT DETECTED Final    Comment: Performed at [facility], [address], [place], [place] [address]  Resp Panel by RT-PCR (Flu A&B, Covid) Anterior Nasal Swab     Status: None   Collection Time: 01/03/22  2:40 AM   Specimen: Anterior Nasal Swab  Result Value Ref Range Status   SARS Coronavirus 2 by RT PCR NEGATIVE NEGATIVE Final    Comment: (NOTE) SARS-CoV-2 target nucleic acids are NOT DETECTED.  The SARS-CoV-2 RNA is generally detectable in upper  respiratory specimens during the acute phase of infection. The lowest concentration of SARS-CoV-2 viral copies this assay can detect is 138 copies/mL. A negative result does not preclude SARS-Cov-2 infection and should not be used as the sole basis for treatment or other patient management decisions. A negative result may occur with  improper specimen collection/handling, submission of specimen other than nasopharyngeal swab, presence of viral mutation(s) within the areas targeted by this assay, and inadequate number of viral copies(<138 copies/mL). A negative result must be combined with clinical observations, patient history, and epidemiological information. The expected result is Negative.  Fact Sheet for Patients:  EntrepreneurPulse.com.au  Fact Sheet for Healthcare Providers:  IncredibleEmployment.be  This test is no t yet approved or cleared by the [place] FDA and  has been authorized for detection and/or diagnosis of SARS-CoV-2 by FDA under an Emergency Use Authorization (EUA). This EUA will remain  in effect (meaning this test can be  used) for the duration of the COVID-19 declaration under Section 564(b)(1) of the Act, 21 U.S.C.section 360bbb-3(b)(1), unless the authorization is terminated  or revoked sooner.       Influenza A by PCR NEGATIVE NEGATIVE Final   Influenza B by PCR NEGATIVE NEGATIVE Final    Comment: (NOTE) The Xpert Xpress SARS-CoV-2/FLU/RSV plus assay is intended as an aid in the diagnosis of influenza from Nasopharyngeal swab specimens and should not be used as a sole basis for treatment. Nasal washings and aspirates are unacceptable for Xpert Xpress SARS-CoV-2/FLU/RSV testing.  Fact Sheet for Patients: EntrepreneurPulse.com.au  Fact Sheet for Healthcare Providers: IncredibleEmployment.be  This test is not yet approved or cleared by the [place] FDA and has been  authorized for detection and/or diagnosis of SARS-CoV-2 by FDA under an Emergency Use Authorization (EUA). This EUA will remain in effect (meaning this test can be used) for the duration of the COVID-19 declaration under Section 564(b)(1) of the Act, 21 U.S.C. section 360bbb-3(b)(1), unless the authorization is terminated or revoked.  Performed at [facility], [place] [address] [place], [place] [address]   Urine Culture     Status: Abnormal   Collection Time: 01/03/22  3:56 AM   Specimen: Urine  Result Value Ref Range Status   Specimen Description IN/OUT CATH URINE  Final   Special Requests   Final    NONE Performed at [facility], [place] [address], [place], [place] [address]    Culture 40,000 COLONIES/mL ESCHERICHIA COLI (A)  Final   Report Status 01/05/2022 FINAL  Final   Organism ID, Bacteria ESCHERICHIA COLI (A)  Final      Susceptibility   Escherichia coli - MIC*    AMPICILLIN >=32 RESISTANT Resistant     CEFAZOLIN <=4 SENSITIVE Sensitive     CEFEPIME <=0.12 SENSITIVE Sensitive     CEFTRIAXONE <=0.25 SENSITIVE Sensitive     CIPROFLOXACIN <=0.25 SENSITIVE Sensitive     GENTAMICIN <=1 SENSITIVE Sensitive     IMIPENEM <=0.25 SENSITIVE Sensitive     NITROFURANTOIN <=16 SENSITIVE Sensitive     TRIMETH/SULFA >=320 RESISTANT Resistant     AMPICILLIN/SULBACTAM >=32 RESISTANT Resistant     PIP/TAZO 64 INTERMEDIATE Intermediate     * 40,000 COLONIES/mL ESCHERICHIA COLI  MRSA Next Gen by PCR, Nasal     Status: None   Collection Time: 01/03/22  5:37 AM   Specimen: Nasal Mucosa; Nasal Swab  Result Value Ref Range Status   MRSA by PCR Next Gen NOT DETECTED NOT DETECTED Final    Comment: (NOTE) The GeneXpert MRSA Assay (FDA approved for NASAL specimens only), is one component of a comprehensive MRSA colonization surveillance program. It is not intended to diagnose MRSA infection nor to guide or monitor treatment for MRSA infections. Test performance is not FDA approved  in patients less than 36 years old. Performed at [facility], [place] [address] [place], [place] [address]          Radiology Studies: DG CHEST PORT 1 VIEW  Result Date: 01/06/2022 CLINICAL DATA:  Fever, chills, hypertension EXAM: PORTABLE CHEST 1 VIEW COMPARISON:  None Available. FINDINGS: Right chest port catheter tip overlies the mid superior vena cava. Unchanged cardiomediastinal silhouette. There are bibasilar airspace opacities, left greater than right. Suspected trace pleural effusions. There are mild interstitial opacities. Upper lung predominant emphysema. No pneumothorax. No acute osseous abnormality. IMPRESSION: Bibasilar airspace opacities, left greater than right, could be atelectasis or pneumonia. Interstitial edema and trace pleural  effusions. Electronically Signed   By: Maurine Simmering M.D.   On: 01/06/2022 08:12   DG Abd Portable 1V  Result Date: 01/06/2022 CLINICAL DATA:  Fever, chills, vomiting, hypertension EXAM: PORTABLE ABDOMEN - 1 VIEW COMPARISON:  Radiograph 01/03/2022 FINDINGS: Nasogastric tube has been removed. There is a right-sided nephroureteral stent in place with unchanged appearance. The bowel gas pattern is nonobstructive. IMPRESSION: No evidence of bowel obstruction. Electronically Signed   By: Maurine Simmering M.D.   On: 01/06/2022 08:10   US LIVER DOPPLER  Result Date: 01/06/2022 CLINICAL DATA:  Elevated LFTs. EXAM: DUPLEX ULTRASOUND OF LIVER TECHNIQUE: Color and duplex Doppler ultrasound was performed to evaluate the hepatic in-flow and out-flow vessels. COMPARISON:  Limited abdominal ultrasound 01/05/2022 FINDINGS: Liver: Limited evaluation of the liver parenchyma. Please refer to the right upper quadrant ultrasound from the same date. Main Portal Vein size: 1.2 cm Portal Vein Velocities Main Prox:  12.6 cm/sec Main Mid: 13.1 cm/sec Main Dist:  12.8 cm/sec Right: 12.8 cm/sec Left: 15.9 cm/sec Hepatic Vein Velocities Right:  27.8 cm/sec Middle:  36.2 cm/sec Left:   18.0 cm/sec IVC: Patent with color Doppler flow. Hepatic Artery Velocity:  107 cm/sec Splenic Vein Velocity:  14.5 cm/sec Spleen: 11.8 cm x 3.8 cm x 4.7 cm with a total volume of 110 cm^3 (411 cm^3 is upper limit normal) Portal Vein Occlusion/Thrombus: No Splenic Vein Occlusion/Thrombus: No Ascites: Present Varices: None Normal hepatopetal flow in the portal veins. Normal hepatofugal flow in the hepatic veins. Ascites in the left upper quadrant. Evidence for bilateral pleural effusions. IMPRESSION: 1. Portal venous system is patent with normal direction of flow. 2. Bilateral pleural effusions.  Small amount of ascites. Electronically Signed   By: Markus Daft M.D.   On: 01/06/2022 08:06   US Abdomen Limited RUQ (LIVER/GB)  Result Date: 01/05/2022 CLINICAL DATA:  Elevated LFT EXAM: ULTRASOUND ABDOMEN LIMITED RIGHT UPPER QUADRANT COMPARISON:  CT 01/03/2022, MRI 02/16/2021 FINDINGS: Gallbladder: Negative for shadowing stones. Slight increased gallbladder wall thickness at 4.9 mm. Negative sonographic Murphy. Common bile duct: Diameter: 2.1 mm Liver: Echogenic nodule at the right hepatic dome, likely corresponds to MRI demonstrated hemangioma. Hepatic echogenicity grossly within normal limits. There is limited visibility due to rib shadowing. Portal vein is patent on color Doppler imaging with normal direction of blood flow towards the liver. Other: Incidental note made of right pleural effusion IMPRESSION: 1. Nonspecific thickened gallbladder wall without stone disease. Findings are nonspecific and could be secondary to liver disease, edema forming states, or cholecystitis. If further imaging is required, correlation with nuclear medicine hepatobiliary imaging could be obtained 2. Probable small hemangioma at the hepatic dome. Slightly limited assessment of liver due to rib shadowing and patient physical condition 3. Right pleural effusion Electronically Signed   By: Donavan Foil M.D.   On: 01/05/2022 21:19    ECHOCARDIOGRAM COMPLETE  Result Date: 01/05/2022    ECHOCARDIOGRAM REPORT   Patient Name:   Curtis [name] Date of Exam: 01/05/2022 Medical Rec #:  [id number]    Height:       70.0 in Accession #:    [id number]   Weight:       218.5 lb Date of Birth:  [birth year]    BSA:          2.167 m Patient Age:    37 years     BP:           103/52 mmHg Patient Gender: M  HR:           58 bpm. Exam Location:  Inpatient Procedure: 2D Echo, Cardiac Doppler, Color Doppler and Intracardiac            Opacification Agent Indications:     Acute Respiratory failure  History:         Patient has no prior history of Echocardiogram examinations.  Sonographer:     Rosann Auerbach, FE, PE Referring Phys:  [id number] [facility] Diagnosing Phys: Oswaldo Milian MD IMPRESSIONS  1. Left ventricular ejection fraction, by estimation, is 45 to 50%. The left ventricle has mildly decreased function. The left ventricle demonstrates regional wall motion abnormalities (see scoring diagram/findings for description). Apical hypokinesis. There is mild left ventricular hypertrophy. Left ventricular diastolic parameters are indeterminate.  2. Right ventricular systolic function is mildly reduced. The right ventricular size is mildly enlarged.  3. Right atrial size was mildly dilated.  4. The mitral valve is normal in structure. No evidence of mitral valve regurgitation. No evidence of mitral stenosis.  5. The aortic valve is tricuspid. Aortic valve regurgitation is not visualized. Aortic valve sclerosis is present, with no evidence of aortic valve stenosis.  6. Aortic dilatation noted. There is mild dilatation of the aortic root, measuring 41 mm. FINDINGS  Left Ventricle: Left ventricular ejection fraction, by estimation, is 45 to 50%. The left ventricle has mildly decreased function. The left ventricle demonstrates regional wall motion abnormalities. The left ventricular internal cavity size was normal in size. There is mild left  ventricular hypertrophy. Left ventricular diastolic parameters are indeterminate.  LV Wall Scoring: The apical septal segment, apical inferior segment, and apex are hypokinetic. The entire anterior wall, entire lateral wall, anterior septum, inferior wall, mid inferoseptal segment, and basal inferoseptal segment are normal. Right Ventricle: The right ventricular size is mildly enlarged. No increase in right ventricular wall thickness. Right ventricular systolic function is mildly reduced. The tricuspid regurgitant velocity is 1.85 m/s, and with an assumed right atrial pressure of 15 mmHg, the estimated right ventricular systolic pressure is 30.0 mmHg. Left Atrium: Left atrial size was normal in size. Right Atrium: Right atrial size was mildly dilated. Pericardium: There is no evidence of pericardial effusion. Mitral Valve: The mitral valve is normal in structure. No evidence of mitral valve regurgitation. No evidence of mitral valve stenosis. Tricuspid Valve: The tricuspid valve is normal in structure. Tricuspid valve regurgitation is trivial. Aortic Valve: The aortic valve is tricuspid. Aortic valve regurgitation is not visualized. Aortic valve sclerosis is present, with no evidence of aortic valve stenosis. Pulmonic Valve: The pulmonic valve was not well visualized. Pulmonic valve regurgitation is not visualized. Aorta: Aortic dilatation noted. There is mild dilatation of the aortic root, measuring 41 mm. IAS/Shunts: The interatrial septum was not well visualized.  LEFT VENTRICLE PLAX 2D LVIDd:         4.90 cm   Diastology LVIDs:         3.70 cm   LV e' medial:    6.53 cm/s LV PW:         1.00 cm   LV E/e' medial:  12.1 LV IVS:        1.10 cm   LV e' lateral:   7.83 cm/s LVOT diam:     2.20 cm   LV E/e' lateral: 10.1 LV SV:         70 LV SV Index:   32 LVOT Area:     3.80 cm  RIGHT VENTRICLE RV  S prime:     9.25 cm/s TAPSE (M-mode): 1.6 cm LEFT ATRIUM           Index        RIGHT ATRIUM           Index LA diam:       3.70 cm 1.71 cm/m   RA Area:     23.40 cm LA Vol (A4C): 59.7 ml 27.55 ml/m  RA Volume:   80.80 ml  37.28 ml/m  AORTIC VALVE LVOT Vmax:   86.80 cm/s LVOT Vmean:  65.700 cm/s LVOT VTI:    0.184 m  AORTA Ao Root diam: 4.05 cm Ao Asc diam:  3.70 cm MITRAL VALVE               TRICUSPID VALVE MV Area (PHT): 3.11 cm    TR Peak grad:   13.7 mmHg MV Decel Time: 244 msec    TR Vmax:        185.00 cm/s MV E velocity: 79.30 cm/s MV A velocity: 44.10 cm/s  SHUNTS MV E/A ratio:  1.80        Systemic VTI:  0.18 m                            Systemic Diam: 2.20 cm Oswaldo Milian MD Electronically signed by Oswaldo Milian MD Signature Date/Time: 01/05/2022/4:17:20 PM    Final (Updated)         Scheduled Meds:  chlorhexidine  15 mL Mouth Rinse BID   Chlorhexidine Gluconate Cloth  6 each Topical Daily   mouth rinse  15 mL Mouth Rinse q12n4p   multivitamin with minerals  1 tablet Oral Daily   sodium chloride flush  10-40 mL Intracatheter Q12H   Continuous Infusions:  sodium chloride Stopped (01/03/22 2157)   ceFEPime (MAXIPIME) IV 2 g (01/07/22 0923)     LOS: 4 days      Phillips Climes, MD [facility]   To contact the attending provider between 7A-7P or the covering provider during after hours 7P-7A, please log into the web site [email or web address] and access using universal [place] password for that web site. If you do not have the password, please call the hospital operator.  01/07/2022, 1:47 PM

## 2022-01-07 NOTE — Consult Note (Addendum)
[name] NOTE   MRN : [id number]  Reason for Consult: Blue toes with pain last 24 hours left LE Referring Physician: Dr. Waldron Labs  History of Present Illness: [name] is a 66 year old gentleman with a history of rectosigmoid cancer status post resection who completed chemotherapy in December 2022 who presents with 2 days of chills and vomiting.  He is being managed for sepsis.  Upon arrival to the ED he was febrile to 101.7.  He was hypotensive requiring initiation of norepinephrine.  CVC placed in the ED. patient admitted to ICU.  He states yesterday he had left toe pains and noticed darkening of the left toe tips.  He was maintained on pressors from 6/11-6/13.  We have been asked to examine his lower extremities and make recommendations.   Past medical history if negative for CAD, DM or PAD.       Current Facility-Administered Medications  Medication Dose Route Frequency Provider Last Rate Last Admin   0.9 %  sodium chloride infusion   Intravenous PRN Julian Hy, DO   Paused at 01/03/22 2157   acetaminophen (TYLENOL) tablet 650 mg  650 mg Oral Q4H PRN Julian Hy, DO   650 mg at 01/07/22 0236   ceFEPIme (MAXIPIME) 2 g in sodium chloride 0.9 % 100 mL IVPB  2 g Intravenous Q8H Rozann Lesches, RPH 200 mL/hr at 01/07/22 6812 2 g at 01/07/22 7517   chlorhexidine (PERIDEX) 0.12 % solution 15 mL  15 mL Mouth Rinse BID Mannam, Praveen, MD   15 mL at 01/07/22 0017   Chlorhexidine Gluconate Cloth 2 % PADS 6 each  6 each Topical Daily Julian Hy, DO   6 each at 01/07/22 4944   diphenhydrAMINE (BENADRYL) injection 12.5 mg  12.5 mg Intravenous Q8H PRN Elgergawy, Silver Huguenin, MD   12.5 mg at 01/07/22 0911   docusate sodium (COLACE) capsule 100 mg  100 mg Oral BID PRN Noemi Chapel P, DO       MEDLINE mouth rinse  15 mL Mouth Rinse q12n4p Mannam, Praveen, MD   15 mL at 01/07/22 1248   multivitamin with minerals tablet 1 tablet  1 tablet Oral Daily  Mannam, Praveen, MD   1 tablet at 01/07/22 0911   ondansetron (ZOFRAN) injection 4 mg  4 mg Intravenous Q6H PRN Noemi Chapel P, DO       polyethylene glycol (MIRALAX / GLYCOLAX) packet 17 g  17 g Oral Daily PRN Noemi Chapel P, DO       potassium PHOSPHATE 30 mmol in dextrose 5 % 500 mL infusion  30 mmol Intravenous Once Elgergawy, Silver Huguenin, MD       sodium chloride flush (NS) 0.9 % injection 10-40 mL  10-40 mL Intracatheter Q12H Noemi Chapel P, DO   10 mL at 01/07/22 0912   sodium chloride flush (NS) 0.9 % injection 10-40 mL  10-40 mL Intracatheter PRN Noemi Chapel P, DO   10 mL at 01/03/22 0959    Pt meds include: Statin :No Betablocker: No ASA: No Other anticoagulants/antiplatelets: none  Past Medical History:  Diagnosis Date   Rectosigmoid cancer [facility])     Past Surgical History:  Procedure Laterality Date   BIOPSY  01/21/2021   Procedure: BIOPSY;  Surgeon: Mauri Pole, MD;  Location: [place];  Service: Endoscopy;;   COLON RESECTION N/A 01/21/2021   Procedure: LAPAROSCOPIC ASSISTED LOOP COLOSTOMY;  Surgeon: Donnie Mesa, MD;  Location: [place];  Service:  General;  Laterality: N/A;   CYSTOSCOPY W/ URETERAL STENT PLACEMENT Right 01/03/2022   Procedure: CYSTOSCOPY WITH  URETERAL STENT PLACEMENT;  Surgeon: Remi Haggard, MD;  Location: [place];  Service: Urology;  Laterality: Right;   FLEXIBLE SIGMOIDOSCOPY N/A 01/21/2021   Procedure: FLEXIBLE SIGMOIDOSCOPY;  Surgeon: Mauri Pole, MD;  Location: [facility];  Service: Endoscopy;  Laterality: N/A;   IR IMAGING GUIDED PORT INSERTION  03/04/2021   SUBMUCOSAL TATTOO INJECTION  01/21/2021   Procedure: SUBMUCOSAL TATTOO INJECTION;  Surgeon: Mauri Pole, MD;  Location: [facility];  Service: Endoscopy;;    Social History Social History   Tobacco Use   Smoking status: Every Day    Types: Cigarettes   Smokeless tobacco: Never  Vaping Use   Vaping Use: Never used  Substance Use Topics   Alcohol use: Never    Drug use: Never    Family History Family History  Problem Relation Age of Onset   Hypertension Mother    Hypertension Father    Breast cancer Sister     Allergies  Allergen Reactions   Cefazolin Hives    Tolerated cefepime/ceftriaxone   Leucovorin Itching and Rash    Pt. had a reaction during infusion with Oxaliplatin. Complained of itching "all over". Red, raised rash noted to anterior chest, upper back, and abdominal area. See progress note 06/22/21  Medicated pt. with Benadryl 50 mg IV then Solumedrol '125mg'$  IV. Waited 20 minutes and chemotherapy restarted   Oxaliplatin Itching and Rash    Pt. had a reaction of itching all over and red, raised rash noted to anterior upper chest, upper back, and abdominal area. See progress note 06/22/21 Medicated pt. with Benadryl 50 mg IV then Solumedrol '125mg'$  IV. Waited 20 minutes and chemotherapy restarted     REVIEW OF SYSTEMS  General: '[ ]'$  Weight loss, '[ ]'$  Fever, '[ ]'$  chills Neurologic: '[ ]'$  Dizziness, '[ ]'$  Blackouts, '[ ]'$  Seizure '[ ]'$  Stroke, '[ ]'$  "Mini stroke", '[ ]'$  Slurred speech, '[ ]'$  Temporary blindness; '[ ]'$  weakness in arms or legs, '[ ]'$  Hoarseness '[ ]'$  Dysphagia Cardiac: '[ ]'$  Chest pain/pressure, '[ ]'$  Shortness of breath at rest '[ ]'$  Shortness of breath with exertion, '[ ]'$  Atrial fibrillation or irregular heartbeat  Vascular: '[ ]'$  Pain in legs with walking, '[ ]'$  Pain in legs at rest, '[ ]'$  Pain in legs at night,  '[ ]'$  Non-healing ulcer, '[ ]'$  Blood clot in vein/DVT,   Pulmonary: '[ ]'$  Home oxygen, '[ ]'$  Productive cough, '[ ]'$  Coughing up blood, '[ ]'$  Asthma,  '[ ]'$  Wheezing '[ ]'$  COPD Musculoskeletal:  '[ ]'$  Arthritis, '[ ]'$  Low back pain, '[ ]'$  Joint pain Hematologic: '[ ]'$  Easy Bruising, '[ ]'$  Anemia; '[ ]'$  Hepatitis Gastrointestinal: '[ ]'$  Blood in stool, '[ ]'$  Gastroesophageal Reflux/heartburn, Urinary: '[ ]'$  chronic Kidney disease, '[ ]'$  on HD - '[ ]'$  MWF or '[ ]'$  TTHS, '[ ]'$  Burning with urination, '[ ]'$  Difficulty urinating Skin: '[ ]'$  Rashes, '[ ]'$  Wounds Psychological: '[ ]'$  Anxiety, [  ] Depression  Physical Examination Vitals:   01/07/22 0400 01/07/22 0500 01/07/22 0751 01/07/22 1204  BP: 139/64  132/74 (!) 147/74  Pulse: 80  76 76  Resp: '19  20 17  '$ Temp: 97.9 F (36.6 C)  97.7 F (36.5 C) 97.8 F (36.6 C)  TempSrc: Oral  Oral Oral  SpO2: 96%  96% 96%  Weight:  98.9 kg    Height:  '5\' 10"'$  (1.778 m)  Body mass index is 31.28 kg/m.  General:  WDWN in NAD HENT: WNL Eyes: Pupils equal Pulmonary: normal non-labored breathing  Cardiac: RRR, without  Murmurs, rubs or gallops; No carotid bruits Abdomen: Stoma type/location: LLQ colostomy Skin: no rashes, ulcers noted;  no Gangrene , no cellulitis; left foot toe tips dark without open wounds  Vascular Exam/Pulses:palpable radial, femoral and DP pulses B LE   Musculoskeletal: no muscle wasting or atrophy; left UE and left LE mild edema edema  Neurologic: A&O X 3; Appropriate Affect ;   MOTOR FUNCTION: grossly intact, generalized weakness Speech is fluent/normal   Significant Diagnostic Studies: CBC Lab Results  Component Value Date   WBC 23.8 (H) 01/07/2022   HGB 8.6 (L) 01/07/2022   HCT 25.8 (L) 01/07/2022   MCV 87.5 01/07/2022   PLT 57 (L) 01/07/2022    BMET    Component Value Date/Time   NA 143 01/07/2022 0407   K 2.9 (L) 01/07/2022 0407   CL 109 01/07/2022 0407   CO2 22 01/07/2022 0407   GLUCOSE 95 01/07/2022 0407   BUN 51 (H) 01/07/2022 0407   CREATININE 2.05 (H) 01/07/2022 0407   CREATININE 1.16 12/16/2021 1027   CALCIUM 8.0 (L) 01/07/2022 0407   GFRNONAA 36 (L) 01/07/2022 0407   GFRNONAA >60 12/16/2021 1027   Estimated Creatinine Clearance: 42.9 mL/min (A) (by C-G formula based on SCr of 2.05 mg/dL (H)).  COAG Lab Results  Component Value Date   INR 2.5 (H) 01/04/2022   INR 1.4 (H) 01/03/2022     Non-Invasive Vascular Imaging:  ABI's pending for baseline  Previous DVT studies negative  ASSESSMENT/PLAN:  Left foot with dusky toe tips and reported pain that has  improved    This could be pressor induced and may resolve now that he is off the pressors.  It could be related to his cancer as well or Thrombocytopenia with elevated INR due to sepsis.  I recommend continued treatment for sepsis and watchful waiting.  It he toes demarcate he may need intervention with amputation.    Palpable DP pulses B, off pressors as of 01/05/22 for sepsis event No vascular intervention indicated with easily palpable pedal pulses.  Roxy Horseman 01/07/2022 2:09 PM  VASCULAR STAFF ADDENDUM: I have independently interviewed and examined the patient. I agree with the above.   Patient with palpable pulses bilaterally in the feet.  Interestingly the blue toes are only appreciated on the left.  Usually with pressor induced small vessel ischemia, this is seen bilaterally.  The discoloration spans all digits which is less common in cardioembolic or atheroembolic etiologies.  No recent intra-arterial manipulation, no history of A-fib.    We will continue to follow and allow this to demarcate.  No plans for vascular surgery intervention at this time.  We will see in follow-up in 1 month.  Cassandria Santee, MD [facility] Phone Number: [phone] 01/07/2022 5:46 PM

## 2022-01-07 NOTE — Progress Notes (Signed)
ABI with TBI study completed. Please see CV Proc for preliminary results.  Blenda Wisecup BS, RVT 01/07/2022 3:59 PM

## 2022-01-07 NOTE — Progress Notes (Signed)
PHARMACY NOTE:  ANTIMICROBIAL RENAL DOSAGE ADJUSTMENT  Current antimicrobial regimen includes a mismatch between antimicrobial dosage and estimated renal function.  As per policy approved by the Pharmacy & Therapeutics and Medical Executive Committees, the antimicrobial dosage will be adjusted accordingly.  Current antimicrobial dosage:  Cefepime 2g IV q8  Indication: e.coli bacteremia  Renal Function:  Estimated Creatinine Clearance: 46.8 mL/min (A) (by C-G formula based on SCr of 1.88 mg/dL (H)). '[]'$      On intermittent HD, scheduled: '[]'$      On CRRT    Antimicrobial dosage has been changed to:  Cefepime 2g IV q12  Additional comments:   Onnie Boer, PharmD, BCIDP, AAHIVP, CPP Infectious Disease Pharmacist 01/07/2022 6:47 PM

## 2022-01-08 DIAGNOSIS — D696 Thrombocytopenia, unspecified: Secondary | ICD-10-CM

## 2022-01-08 DIAGNOSIS — D72829 Elevated white blood cell count, unspecified: Secondary | ICD-10-CM

## 2022-01-08 DIAGNOSIS — N132 Hydronephrosis with renal and ureteral calculous obstruction: Secondary | ICD-10-CM

## 2022-01-08 DIAGNOSIS — R7881 Bacteremia: Secondary | ICD-10-CM

## 2022-01-08 DIAGNOSIS — B9620 Unspecified Escherichia coli [E. coli] as the cause of diseases classified elsewhere: Secondary | ICD-10-CM

## 2022-01-08 LAB — CBC
HCT: 26.8 % — ABNORMAL LOW (ref 39.0–52.0)
Hemoglobin: 9.3 g/dL — ABNORMAL LOW (ref 13.0–17.0)
MCH: 29.7 pg (ref 26.0–34.0)
MCHC: 34.7 g/dL (ref 30.0–36.0)
MCV: 85.6 fL (ref 80.0–100.0)
Platelets: 71 10*3/uL — ABNORMAL LOW (ref 150–400)
RBC: 3.13 MIL/uL — ABNORMAL LOW (ref 4.22–5.81)
RDW: 15.8 % — ABNORMAL HIGH (ref 11.5–15.5)
WBC: 23.4 10*3/uL — ABNORMAL HIGH (ref 4.0–10.5)
nRBC: 0.8 % — ABNORMAL HIGH (ref 0.0–0.2)

## 2022-01-08 LAB — BASIC METABOLIC PANEL
Anion gap: 9 (ref 5–15)
BUN: 43 mg/dL — ABNORMAL HIGH (ref 8–23)
CO2: 22 mmol/L (ref 22–32)
Calcium: 8 mg/dL — ABNORMAL LOW (ref 8.9–10.3)
Chloride: 112 mmol/L — ABNORMAL HIGH (ref 98–111)
Creatinine, Ser: 1.66 mg/dL — ABNORMAL HIGH (ref 0.61–1.24)
GFR, Estimated: 46 mL/min — ABNORMAL LOW (ref >60)
Glucose, Bld: 138 mg/dL — ABNORMAL HIGH (ref 70–99)
Potassium: 3.6 mmol/L (ref 3.5–5.1)
Sodium: 143 mmol/L (ref 135–145)

## 2022-01-08 LAB — GLUCOSE, CAPILLARY
Glucose-Capillary: 148 mg/dL — ABNORMAL HIGH (ref 70–99)
Glucose-Capillary: 163 mg/dL — ABNORMAL HIGH (ref 70–99)

## 2022-01-08 LAB — SEROTONIN RELEASE ASSAY (SRA)
SRA .2 IU/mL UFH Ser-aCnc: <1 % (ref 0–20)
SRA 100IU/mL UFH Ser-aCnc: <1 % (ref 0–20)

## 2022-01-08 LAB — HEPARIN INDUCED PLATELET AB (HIT ANTIBODY): Heparin Induced Plt Ab: 0.068 OD (ref 0.000–0.400)

## 2022-01-08 LAB — PROCALCITONIN: Procalcitonin: 35.54 ng/mL

## 2022-01-08 LAB — PHOSPHORUS: Phosphorus: 3.3 mg/dL (ref 2.5–4.6)

## 2022-01-08 LAB — MAGNESIUM: Magnesium: 1.8 mg/dL (ref 1.7–2.4)

## 2022-01-08 MED ORDER — SODIUM CHLORIDE 0.9 % IV SOLN
2.0000 g | INTRAVENOUS | Status: DC
  Administered 2022-01-08 – 2022-01-11 (×4): 2 g via INTRAVENOUS
  Filled 2022-01-08 (×4): qty 20

## 2022-01-08 NOTE — Progress Notes (Signed)
Inpatient Rehab Admissions Coordinator Note:   Per PT recommendations patient was screened for CIR candidacy by Michel Santee, PT. At this time, pt appears to be a potential candidate for CIR. I will place an order for rehab consult for full assessment, per our protocol.  Please contact me any with questions.Shann Medal, PT, DPT [phone] 01/08/22 2:09 PM

## 2022-01-08 NOTE — Progress Notes (Signed)
PROGRESS NOTE    Curtis [name]  JOI:[id number] DOB: 1956/01/28 DOA: 01/03/2022 PCP: Pcp, No   Chief Complaint  Patient presents with   Unresponsive   Altered Mental Status    Brief Narrative:   [name] is a 66 year old gentleman with a history of rectosigmoid cancer status post resection who completed chemotherapy in December 2022 who presents with 2 days of chills and vomiting.  He did not have a fever at home.  He denies abdominal pain, change in stool/ostomy output, cough, sputum production, significant upper respiratory symptoms, wounds, new rashes.  He has chronic skin breakdown that is unchanged around his ostomy site.  He has a history of dental caries that has been more significant since chemotherapy.  He has not seen a dentist recently.  No recent dental changes. Per his son he has been breathing heavily and has been drifting in and out of sleep.  Upon arrival to the ED he was febrile to 101.7.  He was hypotensive requiring initiation of norepinephrine.  CVC placed in the ED. patient admitted to ICU  Significant events 6/11 admission-started on nor epi, cefepime, Vanco in the ED. Underwent right ureteral stent placement.  Surgery consulted for fluid collection in the presacral area.  Intubated and on pressors 6/12-blood and urine cultures growing E. Coli 6/13- off pressors. On PSV weans, extubated 6/13 UE duplex neg for DVT. Echo EF 45-50%    Assessment & Plan:   Principal Problem:   Septic shock ([place]) Active Problems:   E coli bacteremia   Septic shock secondary to E. coli bacteremia and urosepsis.  With infected right ureteral stone present on admission  - s/p ureteral stent placement by Dr. Milford Cage 6/11 - Appreciate input from surgery who feels that the pre sacral fluid collection is likely not a big contributor. -Initially on IV cefepime, blood culture significant for E. coli, sensitive to cefazolin, narrowed 6/15, he developed hives 6/15, cefazolin changed back to  cefepime.. -Patient with significant leukocytosis, procalcitonin remains elevated, but trending down, I have requested ID input regarding further recommendations. -To follow with urology as an outpatient after discharge. -Leukocytosis and procalcitonin trending down today, which is reassuring.   Acute resp failure - resolved, s/p extubation 6/13. Bronchial hygiene. Mobilize. Wean oxygen as tolerated   AKI -Due to sepsis, hypotension -Continues improving, peak at 3.19, it was 1.66 today, avoid nephrotoxic medications  Hypokalemia. - Repleting   History of rectosigmoid cancer with ostomy bag - Routine ostomy care. -Patient with fluid collection, general surgery input greatly appreciated, at this point no need for sampling given septic work-up related to bacteremia and infected kidney stones    Thrombocytopenia, elevated INR due to sepsis. -  No in DIC as fibrinogen is high. - Elevated d dimer is concerning but suspicion for PE is low and echo reassuring. LE duplex also negative. 4T score is low, SRA is pending,  HIT is pending as well   Elevated LFTs - improved. Liver US negative. Likely from shock liver due to septic shock  Hypokalemia/hypophosphatemia -Repleted  Left toes cyanosis -Dopplers negative for DVT, Patient  with good peripheral pulses, ABIs pending, requested vascular surgery consult   Nutrition. SLP eval.   DVT prophylaxis: Mechanical prophylaxis on hold due to thrombocytopenia) Code Status: Full Family Communication: None at bedside Disposition:   Status is: Inpatient    Consultants:  Urology  PCCM Vascular ID   Subjective:  Reports generalized weakness, fatigue, complaining of left foot pain  Objective: Vitals:   01/07/22  2343 01/08/22 0336 01/08/22 0736 01/08/22 1219  BP: (!) 126/58 (!) 145/58 (!) 149/68 128/69  Pulse:  86 84 75  Resp:  16 (!) 21 20  Temp: 97.9 F (36.6 C) 98.2 F (36.8 C) 98.9 F (37.2 C) 98.6 F (37 C)  TempSrc:  Oral Oral Oral Oral  SpO2:  98% 97% 96%  Weight:      Height:        Intake/Output Summary (Last 24 hours) at 01/08/2022 1254 Last data filed at 01/08/2022 1000 Gross per 24 hour  Intake 1187.24 ml  Output 3350 ml  Net -2162.76 ml   Filed Weights   01/05/22 0423 01/06/22 0400 01/07/22 0500  Weight: 99.1 kg 98 kg 98.9 kg    Examination:  Awake Alert, Oriented X 3, No new F.N deficits, Normal affect, frail,  ill-appearing Symmetrical Chest wall movement, Good air movement bilaterally, CTAB RRR,No Gallops,Rubs or new Murmurs, No Parasternal Heave +ve B.Sounds, Abd Soft, No tenderness, left abdomen ileostomy No Cyanosis, Clubbing or edema, left toe cyanosis, but has good peripheral pulses.     Data Reviewed: I have personally reviewed following labs and imaging studies  CBC: Recent Labs  Lab 01/03/22 0233 01/03/22 0234 01/05/22 0339 01/06/22 0345 01/07/22 0407 01/07/22 1411 01/08/22 0055  WBC 8.4   < > 9.5 16.2* 23.8* 25.8* 23.4*  NEUTROABS 7.0  --   --   --   --  23.4*  --   HGB 10.6*   < > 8.6* 8.6* 8.6* 8.9* 9.3*  HCT 33.0*   < > 24.8* 25.7* 25.8* 27.0* 26.8*  MCV 91.7   < > 86.4 87.1 87.5 87.7 85.6  PLT PLATELET CLUMPS NOTED ON SMEAR, UNABLE TO ESTIMATE   < > 41* 48* 57* 69* 71*   < > = values in this interval not displayed.    Basic Metabolic Panel: Recent Labs  Lab 01/04/22 1742 01/05/22 0339 01/05/22 1717 01/06/22 0345 01/07/22 0407 01/07/22 1411 01/08/22 0055  NA  --  134*  --  139 143 144 143  K  --  4.1  --  3.2* 2.9* 3.2* 3.6  CL  --  104  --  108 109 111 112*  CO2  --  16*  --  21* '22 23 22  '$ GLUCOSE  --  121*  --  101* 95 111* 138*  BUN  --  56*  --  60* 51* 48* 43*  CREATININE  --  2.89*  --  2.72* 2.05* 1.88* 1.66*  CALCIUM  --  7.3*  --  7.7* 8.0* 8.0* 8.0*  MG 2.4 2.2 2.2  --  2.1  --  1.8  PHOS 4.9* 4.5 3.1  --  2.3*  --  3.3    GFR: Estimated Creatinine Clearance: 53 mL/min (A) (by C-G formula based on SCr of 1.66 mg/dL  (H)).  Liver Function Tests: Recent Labs  Lab 01/03/22 0233 01/04/22 0108 01/05/22 0339 01/06/22 0345 01/07/22 1411  AST 32 322* 1,360* 525* 296*  ALT 22 211* 1,020* 828* 390*  ALKPHOS 111 44 65 167* 237*  BILITOT 1.0 1.3* 2.0* 3.2* 3.8*  PROT 5.6* 5.0* 4.7* 5.1* 5.2*  ALBUMIN 2.8* 2.5* 2.2* 2.3* 2.3*    CBG: Recent Labs  Lab 01/07/22 1206 01/07/22 1607 01/07/22 2046 01/08/22 0117 01/08/22 1222  GLUCAP 134* 126* 144* 148* 163*     Recent Results (from the past 240 hour(s))  Culture, blood (Routine x 2)     Status: Abnormal  Collection Time: 01/02/22  8:30 PM   Specimen: BLOOD RIGHT FOREARM  Result Value Ref Range Status   Specimen Description BLOOD RIGHT FOREARM  Final   Special Requests   Final    BOTTLES DRAWN AEROBIC AND ANAEROBIC Blood Culture results may not be optimal due to an inadequate volume of blood received in culture bottles   Culture  Setup Time   Final    GRAM NEGATIVE RODS IN BOTH AEROBIC AND ANAEROBIC BOTTLES CRITICAL VALUE NOTED.  VALUE IS CONSISTENT WITH PREVIOUSLY REPORTED AND CALLED VALUE. Performed at [facility], [place] [address], [place], [place] [address]    Culture ESCHERICHIA COLI (A)  Final   Report Status 01/05/2022 FINAL  Final  Culture, blood (Routine x 2)     Status: Abnormal   Collection Time: 01/03/22  2:35 AM   Specimen: BLOOD LEFT FOREARM  Result Value Ref Range Status   Specimen Description BLOOD LEFT FOREARM  Final   Special Requests   Final    BOTTLES DRAWN AEROBIC AND ANAEROBIC Blood Culture adequate volume   Culture  Setup Time   Final    GRAM NEGATIVE RODS IN BOTH AEROBIC AND ANAEROBIC BOTTLES CRITICAL RESULT CALLED TO, READ BACK BY AND VERIFIED WITH: P. DANG PHARMD, AT 1811 01/03/22 D. VANHOOK Performed at [facility], [place] [address] [place], [place] [address]    Culture ESCHERICHIA COLI (A)  Final   Report Status 01/05/2022 FINAL  Final   Organism ID, Bacteria ESCHERICHIA COLI  Final       Susceptibility   Escherichia coli - MIC*    AMPICILLIN >=32 RESISTANT Resistant     CEFAZOLIN <=4 SENSITIVE Sensitive     CEFEPIME <=0.12 SENSITIVE Sensitive     CEFTAZIDIME <=1 SENSITIVE Sensitive     CEFTRIAXONE <=0.25 SENSITIVE Sensitive     CIPROFLOXACIN <=0.25 SENSITIVE Sensitive     GENTAMICIN <=1 SENSITIVE Sensitive     IMIPENEM <=0.25 SENSITIVE Sensitive     TRIMETH/SULFA >=320 RESISTANT Resistant     AMPICILLIN/SULBACTAM >=32 RESISTANT Resistant     PIP/TAZO >=128 RESISTANT Resistant     * ESCHERICHIA COLI  Blood Culture ID Panel (Reflexed)     Status: Abnormal   Collection Time: 01/03/22  2:35 AM  Result Value Ref Range Status   Enterococcus faecalis NOT DETECTED NOT DETECTED Final   Enterococcus Faecium NOT DETECTED NOT DETECTED Final   Listeria monocytogenes NOT DETECTED NOT DETECTED Final   Staphylococcus species NOT DETECTED NOT DETECTED Final   Staphylococcus aureus (BCID) NOT DETECTED NOT DETECTED Final   Staphylococcus epidermidis NOT DETECTED NOT DETECTED Final   Staphylococcus lugdunensis NOT DETECTED NOT DETECTED Final   Streptococcus species NOT DETECTED NOT DETECTED Final   Streptococcus agalactiae NOT DETECTED NOT DETECTED Final   Streptococcus pneumoniae NOT DETECTED NOT DETECTED Final   Streptococcus pyogenes NOT DETECTED NOT DETECTED Final   A.calcoaceticus-baumannii NOT DETECTED NOT DETECTED Final   Bacteroides fragilis NOT DETECTED NOT DETECTED Final   Enterobacterales DETECTED (A) NOT DETECTED Final    Comment: Enterobacterales represent a large order of gram negative bacteria, not a single organism. CRITICAL RESULT CALLED TO, READ BACK BY AND VERIFIED WITH: P. DANG PHARMD, AT 1811 01/03/22 D. VANHOOK    Enterobacter cloacae complex NOT DETECTED NOT DETECTED Final   Escherichia coli DETECTED (A) NOT DETECTED Final    Comment: CRITICAL RESULT CALLED TO, READ BACK BY AND VERIFIED WITH: P. DANG PHARMD, AT 1811 01/03/22 D. VANHOOK  Klebsiella aerogenes  NOT DETECTED NOT DETECTED Final   Klebsiella oxytoca NOT DETECTED NOT DETECTED Final   Klebsiella pneumoniae NOT DETECTED NOT DETECTED Final   Proteus species NOT DETECTED NOT DETECTED Final   Salmonella species NOT DETECTED NOT DETECTED Final   Serratia marcescens NOT DETECTED NOT DETECTED Final   Haemophilus influenzae NOT DETECTED NOT DETECTED Final   Neisseria meningitidis NOT DETECTED NOT DETECTED Final   Pseudomonas aeruginosa NOT DETECTED NOT DETECTED Final   Stenotrophomonas maltophilia NOT DETECTED NOT DETECTED Final   Candida albicans NOT DETECTED NOT DETECTED Final   Candida auris NOT DETECTED NOT DETECTED Final   Candida glabrata NOT DETECTED NOT DETECTED Final   Candida krusei NOT DETECTED NOT DETECTED Final   Candida parapsilosis NOT DETECTED NOT DETECTED Final   Candida tropicalis NOT DETECTED NOT DETECTED Final   Cryptococcus neoformans/gattii NOT DETECTED NOT DETECTED Final   CTX-M ESBL NOT DETECTED NOT DETECTED Final   Carbapenem resistance IMP NOT DETECTED NOT DETECTED Final   Carbapenem resistance KPC NOT DETECTED NOT DETECTED Final   Carbapenem resistance NDM NOT DETECTED NOT DETECTED Final   Carbapenem resist OXA 48 LIKE NOT DETECTED NOT DETECTED Final   Carbapenem resistance VIM NOT DETECTED NOT DETECTED Final    Comment: Performed at [facility], [address], [place], [place] [address]  Resp Panel by RT-PCR (Flu A&B, Covid) Anterior Nasal Swab     Status: None   Collection Time: 01/03/22  2:40 AM   Specimen: Anterior Nasal Swab  Result Value Ref Range Status   SARS Coronavirus 2 by RT PCR NEGATIVE NEGATIVE Final    Comment: (NOTE) SARS-CoV-2 target nucleic acids are NOT DETECTED.  The SARS-CoV-2 RNA is generally detectable in upper respiratory specimens during the acute phase of infection. The lowest concentration of SARS-CoV-2 viral copies this assay can detect is 138 copies/mL. A negative result does not preclude SARS-Cov-2 infection and should  not be used as the sole basis for treatment or other patient management decisions. A negative result may occur with  improper specimen collection/handling, submission of specimen other than nasopharyngeal swab, presence of viral mutation(s) within the areas targeted by this assay, and inadequate number of viral copies(<138 copies/mL). A negative result must be combined with clinical observations, patient history, and epidemiological information. The expected result is Negative.  Fact Sheet for Patients:  EntrepreneurPulse.com.au  Fact Sheet for Healthcare Providers:  IncredibleEmployment.be  This test is no t yet approved or cleared by the [place] FDA and  has been authorized for detection and/or diagnosis of SARS-CoV-2 by FDA under an Emergency Use Authorization (EUA). This EUA will remain  in effect (meaning this test can be used) for the duration of the COVID-19 declaration under Section 564(b)(1) of the Act, 21 U.S.C.section 360bbb-3(b)(1), unless the authorization is terminated  or revoked sooner.       Influenza A by PCR NEGATIVE NEGATIVE Final   Influenza B by PCR NEGATIVE NEGATIVE Final    Comment: (NOTE) The Xpert Xpress SARS-CoV-2/FLU/RSV plus assay is intended as an aid in the diagnosis of influenza from Nasopharyngeal swab specimens and should not be used as a sole basis for treatment. Nasal washings and aspirates are unacceptable for Xpert Xpress SARS-CoV-2/FLU/RSV testing.  Fact Sheet for Patients: EntrepreneurPulse.com.au  Fact Sheet for Healthcare Providers: IncredibleEmployment.be  This test is not yet approved or cleared by the [place] FDA and has been authorized for detection and/or diagnosis of SARS-CoV-2 by FDA under an Emergency Use Authorization (  EUA). This EUA will remain in effect (meaning this test can be used) for the duration of the COVID-19 declaration under  Section 564(b)(1) of the Act, 21 U.S.C. section 360bbb-3(b)(1), unless the authorization is terminated or revoked.  Performed at [facility], [place] [address], [place], [place] [address]   Urine Culture     Status: Abnormal   Collection Time: 01/03/22  3:56 AM   Specimen: Urine  Result Value Ref Range Status   Specimen Description IN/OUT CATH URINE  Final   Special Requests   Final    NONE Performed at [facility], [place] [address] [place], [place] [address]    Culture 40,000 COLONIES/mL ESCHERICHIA COLI (A)  Final   Report Status 01/05/2022 FINAL  Final   Organism ID, Bacteria ESCHERICHIA COLI (A)  Final      Susceptibility   Escherichia coli - MIC*    AMPICILLIN >=32 RESISTANT Resistant     CEFAZOLIN <=4 SENSITIVE Sensitive     CEFEPIME <=0.12 SENSITIVE Sensitive     CEFTRIAXONE <=0.25 SENSITIVE Sensitive     CIPROFLOXACIN <=0.25 SENSITIVE Sensitive     GENTAMICIN <=1 SENSITIVE Sensitive     IMIPENEM <=0.25 SENSITIVE Sensitive     NITROFURANTOIN <=16 SENSITIVE Sensitive     TRIMETH/SULFA >=320 RESISTANT Resistant     AMPICILLIN/SULBACTAM >=32 RESISTANT Resistant     PIP/TAZO 64 INTERMEDIATE Intermediate     * 40,000 COLONIES/mL ESCHERICHIA COLI  MRSA Next Gen by PCR, Nasal     Status: None   Collection Time: 01/03/22  5:37 AM   Specimen: Nasal Mucosa; Nasal Swab  Result Value Ref Range Status   MRSA by PCR Next Gen NOT DETECTED NOT DETECTED Final    Comment: (NOTE) The GeneXpert MRSA Assay (FDA approved for NASAL specimens only), is one component of a comprehensive MRSA colonization surveillance program. It is not intended to diagnose MRSA infection nor to guide or monitor treatment for MRSA infections. Test performance is not FDA approved in patients less than 40 years old. Performed at [facility], [place] [address] [place], [place] [address]          Radiology Studies: VAS [place] ABI WITH/WO TBI  Result Date: 01/07/2022  LOWER EXTREMITY DOPPLER  STUDY Patient Name:  Curtis [name]  Date of Exam:   01/07/2022 Medical Rec #: [id number]     Accession #:    [id number] Date of Birth: [birth year]     Patient Gender: M Patient Age:   83 years Exam Location:  [facility] Procedure:      VAS [place] ABI WITH/WO TBI Referring Phys: Jamielyn Petrucci --------------------------------------------------------------------------------  Indications: Peripheral artery disease, and Pain in toes, bilateral. High Risk Factors: Current smoker. Other Factors: Septic shock.  Comparison Study: No previous exam noted. Performing Technologist: Bobetta Lime BS, RVT  Examination Guidelines: A complete evaluation includes at minimum, Doppler waveform signals and systolic blood pressure reading at the level of bilateral brachial, anterior tibial, and posterior tibial arteries, when vessel segments are accessible. Bilateral testing is considered an integral part of a complete examination. Photoelectric Plethysmograph (PPG) waveforms and toe systolic pressure readings are included as required and additional duplex testing as needed. Limited examinations for reoccurring indications may be performed as noted.  ABI Findings: +---------+------------------+-----+---------+---------+ Right    Rt Pressure (mmHg)IndexWaveform Comment   +---------+------------------+-----+---------+---------+ Brachial 157                    triphasic          +---------+------------------+-----+---------+---------+  PTA      183               1.12 triphasicHyperemic +---------+------------------+-----+---------+---------+ DP       204               1.25 triphasicHyperemic +---------+------------------+-----+---------+---------+ Great Toe135               0.83                    +---------+------------------+-----+---------+---------+ +---------+------------------+-----+---------+---------+ Left     Lt Pressure (mmHg)IndexWaveform Comment    +---------+------------------+-----+---------+---------+ Brachial 163                    triphasic          +---------+------------------+-----+---------+---------+ PTA      205               1.26 triphasicHyperemic +---------+------------------+-----+---------+---------+ DP       196               1.20 triphasicHyperemic +---------+------------------+-----+---------+---------+ Esau Grew                0.51                    +---------+------------------+-----+---------+---------+  Summary: Right: Resting right ankle-brachial index is within normal range. No evidence of significant right lower extremity arterial disease. The right toe-brachial index is normal. Left: Resting left ankle-brachial index is within normal range. No evidence of significant left lower extremity arterial disease. The left toe-brachial index is abnormal. *See table(s) above for measurements and observations.  Electronically signed by Orlie Pollen on 01/07/2022 at 5:35:34 PM.    Final         Scheduled Meds:  chlorhexidine  15 mL Mouth Rinse BID   Chlorhexidine Gluconate Cloth  6 each Topical Daily   mouth rinse  15 mL Mouth Rinse q12n4p   multivitamin with minerals  1 tablet Oral Daily   sodium chloride flush  10-40 mL Intracatheter Q12H   Continuous Infusions:  sodium chloride Stopped (01/03/22 2157)   cefTRIAXone (ROCEPHIN)  IV       LOS: 5 days      Phillips Climes, MD [facility]   To contact the attending provider between 7A-7P or the covering provider during after hours 7P-7A, please log into the web site [email or web address] and access using universal [place] password for that web site. If you do not have the password, please call the hospital operator.  01/08/2022, 12:54 PM

## 2022-01-08 NOTE — Progress Notes (Signed)
  Transition of Care (TOC) Screening Note   Patient Details  Name: Curtis [name] Date of Birth: 08/23/1955   Transition of Care [facility]) CM/SW Contact:    Benard Halsted, LCSW Phone Number: 01/08/2022, 9:32 AM    Transition of Care Department [facility]) has reviewed patient who has transferred to the unit from ICU on nasal cannula. We will continue to monitor patient advancement through interdisciplinary progression rounds. If new patient transition needs arise, please place a TOC consult.

## 2022-01-08 NOTE — Progress Notes (Signed)
  Transition of Care [facility]) Screening Note   Patient Details  Name: Curtis [name] Date of Birth: [birth year]   Transition of Care [facility]) CM/SW Contact:    Dawayne Patricia, RN Phone Number: 01/08/2022, 1:58 PM    Transition of Care Department [facility]) has reviewed patient and note PT has seen pt today and recommendation for acute INPT rehab. Will need consult for CIR to evaluate. We will continue to monitor patient advancement through interdisciplinary progression rounds. If new patient transition needs arise, please place a TOC consult.

## 2022-01-08 NOTE — Consult Note (Addendum)
[facility]    Date of Admission:  01/03/2022     Total days of antibiotics 5  Cefepime 6/16 >>  Ceftriaxone                Reason for Consult: E coli bacteremia, leukocytosis     Referring Provider: Elgergawy Primary Care Provider: Pcp, No   Assessment: Curtis [name] is a 66 y.o. male with pmhx of stage 3 anal cancer admitted with AMS in the setting of sepsis. Found to have e coli bacteremia in the setting of obstructing kidney stone. He is s/p ureteral stent placement on 6/11. Clinically he reports feeling better on antibiotics but very weak in general. He has had some worsening leukocytosis after procedure but seems to be plateauing. Would put him back on ceftriaxone and follow counts. E coli is covered nicely with ceftriaxone so would consider alternative source control issue for the leukocytosis. If does not continue to downtrend, ?kidney ultrasound to rule out perinephritic abscess. ?repeat CT to ensure no enlarging presacral collection.   High fall risk - agree with PT eval and likely needs some rehab (maybe home health PT?) but he was very shaky with just me assisting to bathroom today.   Rectal discomfort - ?due to PR administered medication; has a presacral fluid collection identified on scan earlier. Maybe if he continues to have this complaint re-scan to ensure not enlarging. Monitor for now.   Lip Sores - ? Hsv. They are drying up from what I can tell and not sure any antivirals would be beneficial now.   Left toe ischemia - appears better than described in chart. Darkened tips to toes, no purulence. DVT negative. ABIs pending as is surgery consult. Follow    Plan: Change back to ceftriaxone Follow leukocytosis over weekend --> seems to be plateau   If no resolving pattern of #2, Consider kidney u/s to rule out complicating feature of pyelo, consider CT of pelvis to re-eval presacral collection.    Principal Problem:   Septic shock  ([place]) E coli bacteremia Hydronephrosis 2/2 ureteral obstruction stone Leukocytosis      chlorhexidine  15 mL Mouth Rinse BID   Chlorhexidine Gluconate Cloth  6 each Topical Daily   mouth rinse  15 mL Mouth Rinse q12n4p   multivitamin with minerals  1 tablet Oral Daily   sodium chloride flush  10-40 mL Intracatheter Q12H    HPI: Curtis [name] is a 66 y.o. male admitted from home due to unresponsiveness.   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 Admission in May 2023 for febrile neutropenia.   EMS arrived to find him minimally responsive to verbal stimuli briefly with SBP in 60s. Fever > 101 F and tachycardic to 140s. Central line placed after minimal response to fluid resuscitation and vasopressors started. AKI on admission with some improvement  but CrCl today still 53. Presacral fluid collection identified on imaging (unclear if this was post op changes or new fluid collection since no imaging following original surgery). Plan was to originally aspirate fluid collection but decided to hold on this given matching GNRs in blood and urine (surgery signed off 6/12).  He has some chronic skin breakdown around the stoma site.   Elevated LFTs --> ultrasound unremarkable. Good liver blood flow.   CT scan 6/11 --> 2. Right-sided pelvocaliectasis and right hydroureter secondary to 3 mm  distal right ureteral stone.  Nonspecific fluid collection within the presacral region. This is incompletely characterized without IV contrast. Although this could represent a benign postoperative fluid collection, abscess is not excluded. Underwent ureteroscopy with right stent placement 6/11  He feels a lot of rectal pressure that is worse today. Attributes this to recent rectally administered medication. He wanted to stand up to get to bathroom as we were evaluating him. Strong urge to use the bathroom for BM.   Has had some trouble with oral  ulcers. Something Dr. Lorenso Courier gives him (liquid) helps to knock it out.    Review of Systems: Review of Systems  Constitutional:  Positive for chills, fever and malaise/fatigue.  Respiratory:  Negative for cough.   Cardiovascular:  Negative for chest pain.  Gastrointestinal:        High output ileostomy   Genitourinary:        Foley in place   Musculoskeletal: Negative.   Neurological:  Positive for weakness. Negative for dizziness and headaches.     Past Medical History:  Diagnosis Date   Rectosigmoid cancer ([place])     Social History   Tobacco Use   Smoking status: Every Day    Types: Cigarettes   Smokeless tobacco: Never  Vaping Use   Vaping Use: Never used  Substance Use Topics   Alcohol use: Never   Drug use: Never    Family History  Problem Relation Age of Onset   Hypertension Mother    Hypertension Father    Breast cancer Sister    Allergies  Allergen Reactions   Cefazolin Hives    Tolerated cefepime/ceftriaxone   Leucovorin Itching and Rash    Pt. had a reaction during infusion with Oxaliplatin. Complained of itching "all over". Red, raised rash noted to anterior chest, upper back, and abdominal area. See progress note 06/22/21  Medicated pt. with Benadryl 50 mg IV then Solumedrol '125mg'$  IV. Waited 20 minutes and chemotherapy restarted   Oxaliplatin Itching and Rash    Pt. had a reaction of itching all over and red, raised rash noted to anterior upper chest, upper back, and abdominal area. See progress note 06/22/21 Medicated pt. with Benadryl 50 mg IV then Solumedrol '125mg'$  IV. Waited 20 minutes and chemotherapy restarted    OBJECTIVE: Blood pressure (!) 149/68, pulse 84, temperature 98.9 F (37.2 C), temperature source Oral, resp. rate (!) 21, height '5\' 10"'$  (1.778 m), weight 98.9 kg, SpO2 97 %.  Physical Exam Vitals and nursing note reviewed.  Constitutional:      Appearance: He is ill-appearing.  HENT:     Mouth/Throat:     Mouth: Mucous membranes  are dry.     Comments: Scabbed ulcerations on lips.  Cardiovascular:     Rate and Rhythm: Normal rate and regular rhythm.  Pulmonary:     Effort: Pulmonary effort is normal.     Breath sounds: Normal breath sounds.  Abdominal:     General: Bowel sounds are normal. There is no distension.     Comments: Ileostomy bag in place, difficult to see stoma peri-skin  Musculoskeletal:     Comments: Lt digits on foot with dusky tips. No purulence.   Skin:    General: Skin is warm and dry.     Capillary Refill: Capillary refill takes less than 2 seconds.  Neurological:     Mental Status: He is alert and oriented to person, place, and time.     Lab Results Lab Results  Component Value Date  WBC 23.4 (H) 01/08/2022   HGB 9.3 (L) 01/08/2022   HCT 26.8 (L) 01/08/2022   MCV 85.6 01/08/2022   PLT 71 (L) 01/08/2022    Lab Results  Component Value Date   CREATININE 1.66 (H) 01/08/2022   BUN 43 (H) 01/08/2022   NA 143 01/08/2022   K 3.6 01/08/2022   CL 112 (H) 01/08/2022   CO2 22 01/08/2022    Lab Results  Component Value Date   ALT 390 (H) 01/07/2022   AST 296 (H) 01/07/2022   ALKPHOS 237 (H) 01/07/2022   BILITOT 3.8 (H) 01/07/2022     Microbiology: Recent Results (from the past 240 hour(s))  Culture, blood (Routine x 2)     Status: Abnormal   Collection Time: 01/02/22  8:30 PM   Specimen: BLOOD RIGHT FOREARM  Result Value Ref Range Status   Specimen Description BLOOD RIGHT FOREARM  Final   Special Requests   Final    BOTTLES DRAWN AEROBIC AND ANAEROBIC Blood Culture results may not be optimal due to an inadequate volume of blood received in culture bottles   Culture  Setup Time   Final    GRAM NEGATIVE RODS IN BOTH AEROBIC AND ANAEROBIC BOTTLES CRITICAL VALUE NOTED.  VALUE IS CONSISTENT WITH PREVIOUSLY REPORTED AND CALLED VALUE. Performed at [facility], [place] [address], [place], [place] [address]    Culture ESCHERICHIA COLI (A)  Final   Report Status 01/05/2022  FINAL  Final  Culture, blood (Routine x 2)     Status: Abnormal   Collection Time: 01/03/22  2:35 AM   Specimen: BLOOD LEFT FOREARM  Result Value Ref Range Status   Specimen Description BLOOD LEFT FOREARM  Final   Special Requests   Final    BOTTLES DRAWN AEROBIC AND ANAEROBIC Blood Culture adequate volume   Culture  Setup Time   Final    GRAM NEGATIVE RODS IN BOTH AEROBIC AND ANAEROBIC BOTTLES CRITICAL RESULT CALLED TO, READ BACK BY AND VERIFIED WITH: P. DANG PHARMD, AT 1811 01/03/22 D. VANHOOK Performed at [facility], [place] [address], [place],  [address]    Culture ESCHERICHIA COLI (A)  Final   Report Status 01/05/2022 FINAL  Final   Organism ID, Bacteria ESCHERICHIA COLI  Final      Susceptibility   Escherichia coli - MIC*    AMPICILLIN >=32 RESISTANT Resistant     CEFAZOLIN <=4 SENSITIVE Sensitive     CEFEPIME <=0.12 SENSITIVE Sensitive     CEFTAZIDIME <=1 SENSITIVE Sensitive     CEFTRIAXONE <=0.25 SENSITIVE Sensitive     CIPROFLOXACIN <=0.25 SENSITIVE Sensitive     GENTAMICIN <=1 SENSITIVE Sensitive     IMIPENEM <=0.25 SENSITIVE Sensitive     TRIMETH/SULFA >=320 RESISTANT Resistant     AMPICILLIN/SULBACTAM >=32 RESISTANT Resistant     PIP/TAZO >=128 RESISTANT Resistant     * ESCHERICHIA COLI  Blood Culture ID Panel (Reflexed)     Status: Abnormal   Collection Time: 01/03/22  2:35 AM  Result Value Ref Range Status   Enterococcus faecalis NOT DETECTED NOT DETECTED Final   Enterococcus Faecium NOT DETECTED NOT DETECTED Final   Listeria monocytogenes NOT DETECTED NOT DETECTED Final   Staphylococcus species NOT DETECTED NOT DETECTED Final   Staphylococcus aureus (BCID) NOT DETECTED NOT DETECTED Final   Staphylococcus epidermidis NOT DETECTED NOT DETECTED Final   Staphylococcus lugdunensis NOT DETECTED NOT DETECTED Final   Streptococcus species NOT DETECTED NOT DETECTED Final   Streptococcus agalactiae  NOT DETECTED NOT DETECTED Final   Streptococcus pneumoniae  NOT DETECTED NOT DETECTED Final   Streptococcus pyogenes NOT DETECTED NOT DETECTED Final   A.calcoaceticus-baumannii NOT DETECTED NOT DETECTED Final   Bacteroides fragilis NOT DETECTED NOT DETECTED Final   Enterobacterales DETECTED (A) NOT DETECTED Final    Comment: Enterobacterales represent a large order of gram negative bacteria, not a single organism. CRITICAL RESULT CALLED TO, READ BACK BY AND VERIFIED WITH: P. DANG PHARMD, AT 1811 01/03/22 D. VANHOOK    Enterobacter cloacae complex NOT DETECTED NOT DETECTED Final   Escherichia coli DETECTED (A) NOT DETECTED Final    Comment: CRITICAL RESULT CALLED TO, READ BACK BY AND VERIFIED WITH: P. DANG PHARMD, AT 1811 01/03/22 D. VANHOOK    Klebsiella aerogenes NOT DETECTED NOT DETECTED Final   Klebsiella oxytoca NOT DETECTED NOT DETECTED Final   Klebsiella pneumoniae NOT DETECTED NOT DETECTED Final   Proteus species NOT DETECTED NOT DETECTED Final   Salmonella species NOT DETECTED NOT DETECTED Final   Serratia marcescens NOT DETECTED NOT DETECTED Final   Haemophilus influenzae NOT DETECTED NOT DETECTED Final   Neisseria meningitidis NOT DETECTED NOT DETECTED Final   Pseudomonas aeruginosa NOT DETECTED NOT DETECTED Final   Stenotrophomonas maltophilia NOT DETECTED NOT DETECTED Final   Candida albicans NOT DETECTED NOT DETECTED Final   Candida auris NOT DETECTED NOT DETECTED Final   Candida glabrata NOT DETECTED NOT DETECTED Final   Candida krusei NOT DETECTED NOT DETECTED Final   Candida parapsilosis NOT DETECTED NOT DETECTED Final   Candida tropicalis NOT DETECTED NOT DETECTED Final   Cryptococcus neoformans/gattii NOT DETECTED NOT DETECTED Final   CTX-M ESBL NOT DETECTED NOT DETECTED Final   Carbapenem resistance IMP NOT DETECTED NOT DETECTED Final   Carbapenem resistance KPC NOT DETECTED NOT DETECTED Final   Carbapenem resistance NDM NOT DETECTED NOT DETECTED Final   Carbapenem resist OXA 48 LIKE NOT DETECTED NOT DETECTED Final    Carbapenem resistance VIM NOT DETECTED NOT DETECTED Final    Comment: Performed at [facility], [address] [place], [place] [address]  Resp Panel by RT-PCR (Flu A&B, Covid) Anterior Nasal Swab     Status: None   Collection Time: 01/03/22  2:40 AM   Specimen: Anterior Nasal Swab  Result Value Ref Range Status   SARS Coronavirus 2 by RT PCR NEGATIVE NEGATIVE Final    Comment: (NOTE) SARS-CoV-2 target nucleic acids are NOT DETECTED.  The SARS-CoV-2 RNA is generally detectable in upper respiratory specimens during the acute phase of infection. The lowest concentration of SARS-CoV-2 viral copies this assay can detect is 138 copies/mL. A negative result does not preclude SARS-Cov-2 infection and should not be used as the sole basis for treatment or other patient management decisions. A negative result may occur with  improper specimen collection/handling, submission of specimen other than nasopharyngeal swab, presence of viral mutation(s) within the areas targeted by this assay, and inadequate number of viral copies(<138 copies/mL). A negative result must be combined with clinical observations, patient history, and epidemiological information. The expected result is Negative.  Fact Sheet for Patients:  EntrepreneurPulse.com.au  Fact Sheet for Healthcare Providers:  IncredibleEmployment.be  This test is no t yet approved or cleared by the [place] FDA and  has been authorized for detection and/or diagnosis of SARS-CoV-2 by FDA under an Emergency Use Authorization (EUA). This EUA will remain  in effect (meaning this test can be used) for the duration of the COVID-19 declaration under Section 564(b)(1)  of the Act, 21 U.S.C.section 360bbb-3(b)(1), unless the authorization is terminated  or revoked sooner.       Influenza A by PCR NEGATIVE NEGATIVE Final   Influenza B by PCR NEGATIVE NEGATIVE Final    Comment: (NOTE) The Xpert Xpress  SARS-CoV-2/FLU/RSV plus assay is intended as an aid in the diagnosis of influenza from Nasopharyngeal swab specimens and should not be used as a sole basis for treatment. Nasal washings and aspirates are unacceptable for Xpert Xpress SARS-CoV-2/FLU/RSV testing.  Fact Sheet for Patients: EntrepreneurPulse.com.au  Fact Sheet for Healthcare Providers: IncredibleEmployment.be  This test is not yet approved or cleared by the [place] FDA and has been authorized for detection and/or diagnosis of SARS-CoV-2 by FDA under an Emergency Use Authorization (EUA). This EUA will remain in effect (meaning this test can be used) for the duration of the COVID-19 declaration under Section 564(b)(1) of the Act, 21 U.S.C. section 360bbb-3(b)(1), unless the authorization is terminated or revoked.  Performed at [facility], [place] [address], [place], [place] [address]   Urine Culture     Status: Abnormal   Collection Time: 01/03/22  3:56 AM   Specimen: Urine  Result Value Ref Range Status   Specimen Description IN/OUT CATH URINE  Final   Special Requests   Final    NONE Performed at [facility], [place] [address] [place], [place] [address]    Culture 40,000 COLONIES/mL ESCHERICHIA COLI (A)  Final   Report Status 01/05/2022 FINAL  Final   Organism ID, Bacteria ESCHERICHIA COLI (A)  Final      Susceptibility   Escherichia coli - MIC*    AMPICILLIN >=32 RESISTANT Resistant     CEFAZOLIN <=4 SENSITIVE Sensitive     CEFEPIME <=0.12 SENSITIVE Sensitive     CEFTRIAXONE <=0.25 SENSITIVE Sensitive     CIPROFLOXACIN <=0.25 SENSITIVE Sensitive     GENTAMICIN <=1 SENSITIVE Sensitive     IMIPENEM <=0.25 SENSITIVE Sensitive     NITROFURANTOIN <=16 SENSITIVE Sensitive     TRIMETH/SULFA >=320 RESISTANT Resistant     AMPICILLIN/SULBACTAM >=32 RESISTANT Resistant     PIP/TAZO 64 INTERMEDIATE Intermediate     * 40,000 COLONIES/mL ESCHERICHIA COLI  MRSA Next  Gen by PCR, Nasal     Status: None   Collection Time: 01/03/22  5:37 AM   Specimen: Nasal Mucosa; Nasal Swab  Result Value Ref Range Status   MRSA by PCR Next Gen NOT DETECTED NOT DETECTED Final    Comment: (NOTE) The GeneXpert MRSA Assay (FDA approved for NASAL specimens only), is one component of a comprehensive MRSA colonization surveillance program. It is not intended to diagnose MRSA infection nor to guide or monitor treatment for MRSA infections. Test performance is not FDA approved in patients less than 13 years old. Performed at [facility], [place] [address] [place], [place] [address]     Janene Madeira, MSN, NP-C [facility] [email or web address] Pager: [phone] Office: [phone] [facility] Main Line: [place] Communication Welcome

## 2022-01-08 NOTE — Evaluation (Signed)
Physical Therapy Evaluation Patient Details Name: Curtis [name] MRN: [id number] DOB: 1956-02-20 Today's Date: 01/08/2022  History of Present Illness  Pt is a 66 y/o male admitted6/11 secondary to chills/vomiting and hypotension. Pt found to have urosepsis and R uretal stone. Pt is s/p R uretal stent and cystoscopy on 6/11 and remained intubated. Pt was extubated on 6/13. Pt found to have L toe discoloration, however, per vascular, had good pulses. PMH includes rectosigmoid cancer s/p resection with ostomy.  Clinical Impression  Pt admitted secondary to problem above with deficits below. Pt requiring heavy min A for transfers and very short distance gait. Pt with decreased safety awareness and requiring continuous cues for safety during mobility tasks. Pt with increased fatigue during mobility tasks. Pt currently at increased risk for falls and has a flight of steps to enter home. Recommending AIR level therapies to increase independence and safety. Will continue to follow acutely.         Recommendations for follow up therapy are one component of a multi-disciplinary discharge planning process, led by the attending physician.  Recommendations may be updated based on patient status, additional functional criteria and insurance authorization.  Follow Up Recommendations Acute inpatient rehab (3hours/day)    Assistance Recommended at Discharge Frequent or constant Supervision/Assistance  Patient can return home with the following  A little help with walking and/or transfers;A little help with bathing/dressing/bathroom;Help with stairs or ramp for entrance;Assist for transportation;Assistance with cooking/housework (a lot of help with steps)    Equipment Recommendations Rolling walker (2 wheels);BSC/3in1  Recommendations for Other Services       Functional Status Assessment Patient has had a recent decline in their functional status and demonstrates the ability to make significant improvements in  function in a reasonable and predictable amount of time.     Precautions / Restrictions Precautions Precautions: Fall Precaution Comments: ostomy Restrictions Weight Bearing Restrictions: No      Mobility  Bed Mobility               General bed mobility comments: In bathroom upon entry    Transfers Overall transfer level: Needs assistance Equipment used: Rolling walker (2 wheels) Transfers: Sit to/from Stand Sit to Stand: Min assist           General transfer comment: Min A for lift assist and steadying to stand.    Ambulation/Gait Ambulation/Gait assistance: Min assist Gait Distance (Feet): 25 Feet Assistive device: Rolling walker (2 wheels) Gait Pattern/deviations: Step-through pattern, Decreased stride length, Trunk flexed Gait velocity: Decreased     General Gait Details: Heavy reliance on BUE throughout. Unsteady and requiring continuous safety cues for use of RW. heavy min A throughout.  Stairs            Wheelchair Mobility    Modified Rankin (Stroke Patients Only)       Balance Overall balance assessment: Needs assistance Sitting-balance support: No upper extremity supported, Feet supported Sitting balance-Leahy Scale: Fair     Standing balance support: Bilateral upper extremity supported Standing balance-Leahy Scale: Poor Standing balance comment: reliant on BUE and external support                             Pertinent Vitals/Pain Pain Assessment Pain Assessment: Faces Faces Pain Scale: Hurts a little bit Pain Location: generalized Pain Descriptors / Indicators: Discomfort Pain Intervention(s): Limited activity within patient's tolerance, Monitored during session, Repositioned    Home Living Family/patient expects to be discharged  to:: Private residence Living Arrangements: Children Available Help at Discharge: Family Type of Home: Apartment Home Access: Stairs to enter Entrance Stairs-Rails: Right Entrance  Stairs-Number of Steps: flight   Home Layout: One level Home Equipment: None      Prior Function Prior Level of Function : Independent/Modified Independent;Working/employed                     Hand Dominance        Extremity/Trunk Assessment   Upper Extremity Assessment Upper Extremity Assessment: Defer to OT evaluation (increased swelling and weakness in L hand)    Lower Extremity Assessment Lower Extremity Assessment: LLE deficits/detail;Generalized weakness LLE Deficits / Details: Increased swelling and weakness in LLE, especially in foot. Noted discoloration at tips of toes    Cervical / Trunk Assessment Cervical / Trunk Assessment: Normal  Communication   Communication: No difficulties  Cognition Arousal/Alertness: Awake/alert Behavior During Therapy: Impulsive Overall Cognitive Status: No family/caregiver present to determine baseline cognitive functioning                                 General Comments: Impulsive at times. Decreased safety awareness and awareness of deficits.        General Comments      Exercises     Assessment/Plan    PT Assessment Patient needs continued PT services  PT Problem List Decreased strength;Decreased activity tolerance;Decreased balance;Decreased mobility;Decreased knowledge of use of DME;Decreased knowledge of precautions;Decreased safety awareness;Decreased cognition       PT Treatment Interventions DME instruction;Gait training;Stair training;Therapeutic activities;Functional mobility training;Therapeutic exercise;Balance training;Patient/family education    PT Goals (Current goals can be found in the Care Plan section)  Acute Rehab PT Goals Patient Stated Goal: to be independent PT Goal Formulation: With patient Time For Goal Achievement: 01/22/22 Potential to Achieve Goals: Good    Frequency Min 3X/week     Co-evaluation               AM-PAC PT "6 Clicks" Mobility  Outcome  Measure Help needed turning from your back to your side while in a flat bed without using bedrails?: A Little Help needed moving from lying on your back to sitting on the side of a flat bed without using bedrails?: A Lot Help needed moving to and from a bed to a chair (including a wheelchair)?: A Little Help needed standing up from a chair using your arms (e.g., wheelchair or bedside chair)?: A Little Help needed to walk in hospital room?: A Little Help needed climbing 3-5 steps with a railing? : Total 6 Click Score: 15    End of Session Equipment Utilized During Treatment: Gait belt Activity Tolerance: Patient tolerated treatment well Patient left: in chair;with call bell/phone within reach;with chair alarm set Nurse Communication: Mobility status;Other (comment) (pt reports ostomy back felt loose) PT Visit Diagnosis: Unsteadiness on feet (R26.81);Muscle weakness (generalized) (M62.81);Difficulty in walking, not elsewhere classified (R26.2)    Time: 1121-1140 PT Time Calculation (min) (ACUTE ONLY): 19 min   Charges:   PT Evaluation $PT Eval Moderate Complexity: 1 Mod          Curtis [name], PT, DPT  Acute Rehabilitation Services  Office: [phone]   Rudean Hitt 01/08/2022, 1:53 PM

## 2022-01-09 DIAGNOSIS — C20 Malignant neoplasm of rectum: Secondary | ICD-10-CM

## 2022-01-09 LAB — CBC
HCT: 26.4 % — ABNORMAL LOW (ref 39.0–52.0)
Hemoglobin: 8.8 g/dL — ABNORMAL LOW (ref 13.0–17.0)
MCH: 28.9 pg (ref 26.0–34.0)
MCHC: 33.3 g/dL (ref 30.0–36.0)
MCV: 86.8 fL (ref 80.0–100.0)
Platelets: 90 10*3/uL — ABNORMAL LOW (ref 150–400)
RBC: 3.04 MIL/uL — ABNORMAL LOW (ref 4.22–5.81)
RDW: 16.2 % — ABNORMAL HIGH (ref 11.5–15.5)
WBC: 20 10*3/uL — ABNORMAL HIGH (ref 4.0–10.5)
nRBC: 0.2 % (ref 0.0–0.2)

## 2022-01-09 LAB — BASIC METABOLIC PANEL
Anion gap: 13 (ref 5–15)
BUN: 34 mg/dL — ABNORMAL HIGH (ref 8–23)
CO2: 23 mmol/L (ref 22–32)
Calcium: 8.1 mg/dL — ABNORMAL LOW (ref 8.9–10.3)
Chloride: 107 mmol/L (ref 98–111)
Creatinine, Ser: 1.4 mg/dL — ABNORMAL HIGH (ref 0.61–1.24)
GFR, Estimated: 56 mL/min — ABNORMAL LOW (ref >60)
Glucose, Bld: 120 mg/dL — ABNORMAL HIGH (ref 70–99)
Potassium: 3 mmol/L — ABNORMAL LOW (ref 3.5–5.1)
Sodium: 143 mmol/L (ref 135–145)

## 2022-01-09 LAB — PROCALCITONIN: Procalcitonin: 16.85 ng/mL

## 2022-01-09 LAB — PHOSPHORUS: Phosphorus: 2.8 mg/dL (ref 2.5–4.6)

## 2022-01-09 MED ORDER — MAGIC MOUTHWASH
10.0000 mL | Freq: Four times a day (QID) | ORAL | Status: DC
  Administered 2022-01-09 – 2022-01-11 (×7): 10 mL via ORAL
  Filled 2022-01-09 (×11): qty 10

## 2022-01-09 MED ORDER — POTASSIUM CHLORIDE CRYS ER 20 MEQ PO TBCR
30.0000 meq | EXTENDED_RELEASE_TABLET | Freq: Four times a day (QID) | ORAL | Status: AC
  Administered 2022-01-09 (×3): 30 meq via ORAL
  Filled 2022-01-09 (×3): qty 1

## 2022-01-09 MED ORDER — HEPARIN SODIUM (PORCINE) 5000 UNIT/ML IJ SOLN
5000.0000 [IU] | Freq: Three times a day (TID) | INTRAMUSCULAR | Status: DC
  Administered 2022-01-09 – 2022-01-11 (×7): 5000 [IU] via SUBCUTANEOUS
  Filled 2022-01-09 (×7): qty 1

## 2022-01-09 MED ORDER — ACYCLOVIR 5 % EX OINT
TOPICAL_OINTMENT | CUTANEOUS | Status: DC
  Administered 2022-01-10: 1 via TOPICAL
  Filled 2022-01-09: qty 15

## 2022-01-09 MED ORDER — VALACYCLOVIR HCL 500 MG PO TABS
2000.0000 mg | ORAL_TABLET | Freq: Two times a day (BID) | ORAL | Status: AC
Start: 2022-01-09 — End: 2022-01-09
  Administered 2022-01-09 (×2): 2000 mg via ORAL
  Filled 2022-01-09 (×2): qty 4

## 2022-01-09 MED ORDER — ACYCLOVIR 5 % EX CREA
TOPICAL_CREAM | CUTANEOUS | Status: DC
Start: 2022-01-09 — End: 2022-01-09

## 2022-01-09 MED ORDER — LACTATED RINGERS IV SOLN: INTRAVENOUS | Status: AC

## 2022-01-09 NOTE — Evaluation (Signed)
Occupational Therapy Evaluation Patient Details Name: Curtis [name] MRN: [id number] DOB: Oct 21, 1955 Today's Date: 01/09/2022   History of Present Illness Pt is a 66 y/o male admitted6/11 secondary to chills/vomiting and hypotension. Pt found to have urosepsis and R uretal stone. Pt is s/p R uretal stent and cystoscopy on 6/11 and remained intubated. Pt was extubated on 6/13. Pt found to have L toe discoloration, however, per vascular, had good pulses. PMH includes rectosigmoid cancer s/p resection with ostomy.   Clinical Impression   Pt presents with decline in function and safety with ADLs and ADL mobility with impaired strength, balance, endurance and cognition with Poor safety awareness. PTA pt lived at home with family and was Ind with ADLs, mobility, was driving and working. Pt currently requires min guard A with UB ADLs, min A with LB ADLs, min A with transfers.  Pt with decreased safety awareness and requiring continuous cues for safety during functional tasks. Pt would benefit from skilled OT services to      Recommendations for follow up therapy are one component of a multi-disciplinary discharge planning process, led by the attending physician.  Recommendations may be updated based on patient status, additional functional criteria and insurance authorization.   Follow Up Recommendations  Acute inpatient rehab (3hours/day)    Assistance Recommended at Discharge Frequent or constant Supervision/Assistance  Patient can return home with the following A little help with bathing/dressing/bathroom;A little help with walking and/or transfers;Direct supervision/assist for medications management    Functional Status Assessment  Patient has had a recent decline in their functional status and demonstrates the ability to make significant improvements in function in a reasonable and predictable amount of time.  Equipment Recommendations  BSC/3in1;Tub/shower seat;Other (comment) (RW)     Recommendations for Other Services       Precautions / Restrictions Precautions Precautions: Fall Precaution Comments: ostomy Restrictions Weight Bearing Restrictions: No      Mobility Bed Mobility Overal bed mobility: Needs Assistance Bed Mobility: Supine to Sit     Supine to sit: Supervision          Transfers Overall transfer level: Needs assistance Equipment used: Rolling walker (2 wheels) Transfers: Sit to/from Stand Sit to Stand: Min assist                  Balance Overall balance assessment: Needs assistance Sitting-balance support: No upper extremity supported, Feet supported Sitting balance-Leahy Scale: Fair     Standing balance support: Bilateral upper extremity supported Standing balance-Leahy Scale: Poor                             ADL either performed or assessed with clinical judgement   ADL Overall ADL's : Needs assistance/impaired Eating/Feeding: Independent   Grooming: Wash/dry hands;Wash/dry face;Min guard;Sitting   Upper Body Bathing: Min guard;Sitting   Lower Body Bathing: Moderate assistance   Upper Body Dressing : Min guard;Sitting   Lower Body Dressing: Moderate assistance   Toilet Transfer: Minimal assistance;Rolling walker (2 wheels);Ambulation;Cueing for safety;Cueing for sequencing;Grab bars Toilet Transfer Details (indicate cue type and reason): pt with ostomy and foley Toileting- Clothing Manipulation and Hygiene: Minimal assistance;Sit to/from stand       Functional mobility during ADLs: Minimal assistance;Rolling walker (2 wheels);Cueing for safety;Cueing for sequencing General ADL Comments: pt impulsive, poor safety awareness     Vision Baseline Vision/History: 1 Wears glasses Ability to See in Adequate Light: 0 Adequate Patient Visual Report: No change from baseline  Perception     Praxis      Pertinent Vitals/Pain Pain Assessment Pain Assessment: No/denies pain Faces Pain Scale:  Hurts a little bit Pain Location: generalized Pain Descriptors / Indicators: Discomfort Pain Intervention(s): Monitored during session, Repositioned     Hand Dominance Right   Extremity/Trunk Assessment Upper Extremity Assessment Upper Extremity Assessment: Generalized weakness   Lower Extremity Assessment Lower Extremity Assessment: Defer to PT evaluation   Cervical / Trunk Assessment Cervical / Trunk Assessment: Normal   Communication Communication Communication: No difficulties   Cognition Arousal/Alertness: Awake/alert Behavior During Therapy: Impulsive Overall Cognitive Status: No family/caregiver present to determine baseline cognitive functioning Area of Impairment: Memory, Following commands, Safety/judgement, Awareness, Problem solving                       Following Commands: Follows multi-step commands inconsistently     Problem Solving: Requires verbal cues, Requires tactile cues General Comments: Impulsive at times. Decreased safety awareness and awareness of deficits.     General Comments       Exercises     Shoulder Instructions      Home Living Family/patient expects to be discharged to:: Private residence Living Arrangements: Children Available Help at Discharge: Family Type of Home: Apartment Home Access: Stairs to enter Technical brewer of Steps: flight Entrance Stairs-Rails: Right Home Layout: One level     Bathroom Shower/Tub: Teacher, early years/pre: Standard     Home Equipment: None          Prior Functioning/Environment Prior Level of Function : Independent/Modified Independent;Working/employed                        OT Problem List: Decreased strength;Impaired balance (sitting and/or standing);Decreased cognition;Decreased safety awareness;Decreased activity tolerance;Decreased coordination;Decreased knowledge of use of DME or AE      OT Treatment/Interventions: Self-care/ADL training;DME  and/or AE instruction;Therapeutic activities;Balance training;Therapeutic exercise;Cognitive remediation/compensation;Neuromuscular education;Patient/family education    OT Goals(Current goals can be found in the care plan section) Acute Rehab OT Goals Patient Stated Goal: 'get out of here on Tuesday" OT Goal Formulation: With patient Time For Goal Achievement: 01/23/22 Potential to Achieve Goals: Good ADL Goals Pt Will Perform Grooming: with supervision;with set-up;standing Pt Will Perform Upper Body Bathing: with supervision;with set-up;sitting Pt Will Perform Lower Body Bathing: with min guard assist;sit to/from stand Pt Will Perform Upper Body Dressing: with supervision;with set-up;sitting Pt Will Perform Lower Body Dressing: with min guard assist;sit to/from stand Pt Will Transfer to Toilet: with min guard assist;with supervision;ambulating Pt Will Perform Toileting - Clothing Manipulation and hygiene: with min guard assist;with supervision;sit to/from stand  OT Frequency: Min 2X/week    Co-evaluation              AM-PAC OT "6 Clicks" Daily Activity     Outcome Measure Help from another person eating meals?: None Help from another person taking care of personal grooming?: A Little Help from another person toileting, which includes using toliet, bedpan, or urinal?: A Little Help from another person bathing (including washing, rinsing, drying)?: A Little Help from another person to put on and taking off regular upper body clothing?: A Little Help from another person to put on and taking off regular lower body clothing?: A Little 6 Click Score: 19   End of Session Equipment Utilized During Treatment: Gait belt;Rolling walker (2 wheels) Nurse Communication: Mobility status  Activity Tolerance: Patient tolerated treatment well Patient left: in chair;with call bell/phone  within reach;with chair alarm set  OT Visit Diagnosis: Unsteadiness on feet (R26.81);Other abnormalities of  gait and mobility (R26.89);Muscle weakness (generalized) (M62.81);Cognitive communication deficit (R41.841)                Time: 0104-0459 OT Time Calculation (min): 25 min Charges:  OT General Charges $OT Visit: 1 Visit OT Evaluation $OT Eval Moderate Complexity: 1 Mod OT Treatments $Therapeutic Activity: 8-22 mins    Britt Bottom 01/09/2022, 12:37 PM

## 2022-01-09 NOTE — Progress Notes (Signed)
PROGRESS NOTE    Curtis [name]  TIR:[id number] DOB: 1955/11/08 DOA: 01/03/2022 PCP: Pcp, No   Chief Complaint  Patient presents with   Unresponsive   Altered Mental Status    Brief Narrative:   [name] is a 66 year old gentleman with a history of rectosigmoid cancer status post resection who completed chemotherapy in December 2022 who presents with 2 days of chills and vomiting.  He did not have a fever at home.  He denies abdominal pain, change in stool/ostomy output, cough, sputum production, significant upper respiratory symptoms, wounds, new rashes.  He has chronic skin breakdown that is unchanged around his ostomy site.  He has a history of dental caries that has been more significant since chemotherapy.  He has not seen a dentist recently.  No recent dental changes. Per his son he has been breathing heavily and has been drifting in and out of sleep.  Upon arrival to the ED he was febrile to 101.7.  He was hypotensive requiring initiation of norepinephrine.  CVC placed in the ED. patient admitted to ICU  Significant events 6/11 admission-started on nor epi, cefepime, Vanco in the ED. Underwent right ureteral stent placement.  Surgery consulted for fluid collection in the presacral area.  Intubated and on pressors 6/12-blood and urine cultures growing E. Coli 6/13- off pressors. On PSV weans, extubated 6/13 UE duplex neg for DVT. Echo EF 45-50%    Assessment & Plan:   Principal Problem:   Septic shock ([place]) Active Problems:   Rectal cancer ([place])   E coli bacteremia   Thrombocytopenia ([facility])   Septic shock secondary to E. coli bacteremia and urosepsis.  With infected right ureteral stone present on admission  - s/p ureteral stent placement by Dr. Milford Cage 6/11 - Appreciate input from surgery who feels that the pre sacral fluid collection is likely not a big contributor. -Antibiotics management per ID, currently on IV Rocephin. -White blood cell count and procalcitonin is  improving, continue to monitor closely -To follow with urology as an outpatient after discharge. -Leukocytosis and procalcitonin trending down today, which is reassuring.   Acute resp failure - resolved, s/p extubation 6/13. Bronchial hygiene. Mobilize. Wean oxygen as tolerated Encouraged to use incentive spirometry and flutter valve   AKI -Due to sepsis, hypotension -Continues improving, peak at 3.19, it was 1.4 today, avoid nephrotoxic medications -We will DC Foley catheter today, monitor closely for retention  Hypokalemia. - Repleting   History of rectosigmoid cancer with ostomy bag - Routine ostomy care. -Patient with fluid collection, general surgery input greatly appreciated, at this point no need for sampling given septic work-up related to bacteremia and infected kidney stones    Thrombocytopenia, elevated INR due to sepsis. -  No in DIC as fibrinogen is high. - Elevated d dimer is concerning but suspicion for PE is low and echo reassuring. LE duplex also negative. 4T score is low, SRA and HIT within normal limit, no indication for HIT   Elevated LFTs - improved. Liver US negative. Likely from shock liver due to septic shock Improving  Hypokalemia/hypophosphatemia -Repleted  Left toes cyanosis -Dopplers negative for DVT, Patient  with good peripheral pulses, ABIs with no significant vascular disease, vascular surgery input appreciated.     Herpes Labialis -Will give acyclovir ointment and Valtrex  Nutrition. SLP eval.   DVT prophylaxis: Heparin Code Status: Full Family Communication: None at bedside Disposition:   Status is: Inpatient    Consultants:  Urology  PCCM Vascular ID   Subjective:  Reports generalized weakness, fatigue, reports  left with pain has improved  Objective: Vitals:   01/08/22 2000 01/09/22 0000 01/09/22 0310 01/09/22 0804  BP: (!) 153/64 (!) 150/62 (!) 163/77 (!) 135/53  Pulse: 88 77 78 79  Resp: '20 20 20 20  '$ Temp:  98.1 F (36.7 C) 98.3 F (36.8 C) 98.6 F (37 C)   TempSrc: Oral Oral Oral   SpO2: 94% 95% 94% 93%  Weight:      Height:        Intake/Output Summary (Last 24 hours) at 01/09/2022 1442 Last data filed at 01/09/2022 1230 Gross per 24 hour  Intake 580 ml  Output 3025 ml  Net -2445 ml   Filed Weights   01/05/22 0423 01/06/22 0400 01/07/22 0500  Weight: 99.1 kg 98 kg 98.9 kg    Examination:  Awake Alert, Oriented X 3, No new F.N deficits, Normal affect appearing, lip lesions Symmetrical Chest wall movement, Good air movement bilaterally, CTAB RRR,No Gallops,Rubs or new Murmurs, No Parasternal Heave +ve B.Sounds, Abd Soft, No tenderness, No rebound - guarding or rigidity.iliostomy + Lower extremity edema has improved, no significant change with tip of his left toe discoloration     Data Reviewed: I have personally reviewed following labs and imaging studies  CBC: Recent Labs  Lab 01/03/22 0233 01/03/22 0234 01/06/22 0345 01/07/22 0407 01/07/22 1411 01/08/22 0055 01/09/22 0340  WBC 8.4   < > 16.2* 23.8* 25.8* 23.4* 20.0*  NEUTROABS 7.0  --   --   --  23.4*  --   --   HGB 10.6*   < > 8.6* 8.6* 8.9* 9.3* 8.8*  HCT 33.0*   < > 25.7* 25.8* 27.0* 26.8* 26.4*  MCV 91.7   < > 87.1 87.5 87.7 85.6 86.8  PLT PLATELET CLUMPS NOTED ON SMEAR, UNABLE TO ESTIMATE   < > 48* 57* 69* 71* 90*   < > = values in this interval not displayed.    Basic Metabolic Panel: Recent Labs  Lab 01/04/22 1742 01/05/22 0339 01/05/22 1717 01/06/22 0345 01/07/22 0407 01/07/22 1411 01/08/22 0055 01/09/22 0340  NA  --  134*  --  139 143 144 143 143  K  --  4.1  --  3.2* 2.9* 3.2* 3.6 3.0*  CL  --  104  --  108 109 111 112* 107  CO2  --  16*  --  21* '22 23 22 23  '$ GLUCOSE  --  121*  --  101* 95 111* 138* 120*  BUN  --  56*  --  60* 51* 48* 43* 34*  CREATININE  --  2.89*  --  2.72* 2.05* 1.88* 1.66* 1.40*  CALCIUM  --  7.3*  --  7.7* 8.0* 8.0* 8.0* 8.1*  MG 2.4 2.2 2.2  --  2.1  --  1.8  --    PHOS 4.9* 4.5 3.1  --  2.3*  --  3.3 2.8    GFR: Estimated Creatinine Clearance: 62.9 mL/min (A) (by C-G formula based on SCr of 1.4 mg/dL (H)).  Liver Function Tests: Recent Labs  Lab 01/03/22 0233 01/04/22 0108 01/05/22 0339 01/06/22 0345 01/07/22 1411  AST 32 322* 1,360* 525* 296*  ALT 22 211* 1,020* 828* 390*  ALKPHOS 111 44 65 167* 237*  BILITOT 1.0 1.3* 2.0* 3.2* 3.8*  PROT 5.6* 5.0* 4.7* 5.1* 5.2*  ALBUMIN 2.8* 2.5* 2.2* 2.3* 2.3*    CBG: Recent Labs  Lab 01/07/22 1206 01/07/22 1607 01/07/22 2046 01/08/22 0117  01/08/22 1222  GLUCAP 134* 126* 144* 148* 163*     Recent Results (from the past 240 hour(s))  Culture, blood (Routine x 2)     Status: Abnormal   Collection Time: 01/02/22  8:30 PM   Specimen: BLOOD RIGHT FOREARM  Result Value Ref Range Status   Specimen Description BLOOD RIGHT FOREARM  Final   Special Requests   Final    BOTTLES DRAWN AEROBIC AND ANAEROBIC Blood Culture results may not be optimal due to an inadequate volume of blood received in culture bottles   Culture  Setup Time   Final    GRAM NEGATIVE RODS IN BOTH AEROBIC AND ANAEROBIC BOTTLES CRITICAL VALUE NOTED.  VALUE IS CONSISTENT WITH PREVIOUSLY REPORTED AND CALLED VALUE. Performed at [facility], [place] [address] [place], [place] [address]    Culture ESCHERICHIA COLI (A)  Final   Report Status 01/05/2022 FINAL  Final  Culture, blood (Routine x 2)     Status: Abnormal   Collection Time: 01/03/22  2:35 AM   Specimen: BLOOD LEFT FOREARM  Result Value Ref Range Status   Specimen Description BLOOD LEFT FOREARM  Final   Special Requests   Final    BOTTLES DRAWN AEROBIC AND ANAEROBIC Blood Culture adequate volume   Culture  Setup Time   Final    GRAM NEGATIVE RODS IN BOTH AEROBIC AND ANAEROBIC BOTTLES CRITICAL RESULT CALLED TO, READ BACK BY AND VERIFIED WITH: P. DANG PHARMD, AT 1811 01/03/22 D. VANHOOK Performed at [facility], [place] [address], [place], [place] [address]     Culture ESCHERICHIA COLI (A)  Final   Report Status 01/05/2022 FINAL  Final   Organism ID, Bacteria ESCHERICHIA COLI  Final      Susceptibility   Escherichia coli - MIC*    AMPICILLIN >=32 RESISTANT Resistant     CEFAZOLIN <=4 SENSITIVE Sensitive     CEFEPIME <=0.12 SENSITIVE Sensitive     CEFTAZIDIME <=1 SENSITIVE Sensitive     CEFTRIAXONE <=0.25 SENSITIVE Sensitive     CIPROFLOXACIN <=0.25 SENSITIVE Sensitive     GENTAMICIN <=1 SENSITIVE Sensitive     IMIPENEM <=0.25 SENSITIVE Sensitive     TRIMETH/SULFA >=320 RESISTANT Resistant     AMPICILLIN/SULBACTAM >=32 RESISTANT Resistant     PIP/TAZO >=128 RESISTANT Resistant     * ESCHERICHIA COLI  Blood Culture ID Panel (Reflexed)     Status: Abnormal   Collection Time: 01/03/22  2:35 AM  Result Value Ref Range Status   Enterococcus faecalis NOT DETECTED NOT DETECTED Final   Enterococcus Faecium NOT DETECTED NOT DETECTED Final   Listeria monocytogenes NOT DETECTED NOT DETECTED Final   Staphylococcus species NOT DETECTED NOT DETECTED Final   Staphylococcus aureus (BCID) NOT DETECTED NOT DETECTED Final   Staphylococcus epidermidis NOT DETECTED NOT DETECTED Final   Staphylococcus lugdunensis NOT DETECTED NOT DETECTED Final   Streptococcus species NOT DETECTED NOT DETECTED Final   Streptococcus agalactiae NOT DETECTED NOT DETECTED Final   Streptococcus pneumoniae NOT DETECTED NOT DETECTED Final   Streptococcus pyogenes NOT DETECTED NOT DETECTED Final   A.calcoaceticus-baumannii NOT DETECTED NOT DETECTED Final   Bacteroides fragilis NOT DETECTED NOT DETECTED Final   Enterobacterales DETECTED (A) NOT DETECTED Final    Comment: Enterobacterales represent a large order of gram negative bacteria, not a single organism. CRITICAL RESULT CALLED TO, READ BACK BY AND VERIFIED WITH: P. DANG PHARMD, AT 1811 01/03/22 D. VANHOOK    Enterobacter cloacae complex NOT DETECTED NOT DETECTED Final  Escherichia coli DETECTED (A) NOT DETECTED Final     Comment: CRITICAL RESULT CALLED TO, READ BACK BY AND VERIFIED WITH: P. DANG PHARMD, AT 1811 01/03/22 D. VANHOOK    Klebsiella aerogenes NOT DETECTED NOT DETECTED Final   Klebsiella oxytoca NOT DETECTED NOT DETECTED Final   Klebsiella pneumoniae NOT DETECTED NOT DETECTED Final   Proteus species NOT DETECTED NOT DETECTED Final   Salmonella species NOT DETECTED NOT DETECTED Final   Serratia marcescens NOT DETECTED NOT DETECTED Final   Haemophilus influenzae NOT DETECTED NOT DETECTED Final   Neisseria meningitidis NOT DETECTED NOT DETECTED Final   Pseudomonas aeruginosa NOT DETECTED NOT DETECTED Final   Stenotrophomonas maltophilia NOT DETECTED NOT DETECTED Final   Candida albicans NOT DETECTED NOT DETECTED Final   Candida auris NOT DETECTED NOT DETECTED Final   Candida glabrata NOT DETECTED NOT DETECTED Final   Candida krusei NOT DETECTED NOT DETECTED Final   Candida parapsilosis NOT DETECTED NOT DETECTED Final   Candida tropicalis NOT DETECTED NOT DETECTED Final   Cryptococcus neoformans/gattii NOT DETECTED NOT DETECTED Final   CTX-M ESBL NOT DETECTED NOT DETECTED Final   Carbapenem resistance IMP NOT DETECTED NOT DETECTED Final   Carbapenem resistance KPC NOT DETECTED NOT DETECTED Final   Carbapenem resistance NDM NOT DETECTED NOT DETECTED Final   Carbapenem resist OXA 48 LIKE NOT DETECTED NOT DETECTED Final   Carbapenem resistance VIM NOT DETECTED NOT DETECTED Final    Comment: Performed at [facility], [address] [place], [place] [address]  Resp Panel by RT-PCR (Flu A&B, Covid) Anterior Nasal Swab     Status: None   Collection Time: 01/03/22  2:40 AM   Specimen: Anterior Nasal Swab  Result Value Ref Range Status   SARS Coronavirus 2 by RT PCR NEGATIVE NEGATIVE Final    Comment: (NOTE) SARS-CoV-2 target nucleic acids are NOT DETECTED.  The SARS-CoV-2 RNA is generally detectable in upper respiratory specimens during the acute phase of infection. The lowest concentration  of SARS-CoV-2 viral copies this assay can detect is 138 copies/mL. A negative result does not preclude SARS-Cov-2 infection and should not be used as the sole basis for treatment or other patient management decisions. A negative result may occur with  improper specimen collection/handling, submission of specimen other than nasopharyngeal swab, presence of viral mutation(s) within the areas targeted by this assay, and inadequate number of viral copies(<138 copies/mL). A negative result must be combined with clinical observations, patient history, and epidemiological information. The expected result is Negative.  Fact Sheet for Patients:  EntrepreneurPulse.com.au  Fact Sheet for Healthcare Providers:  IncredibleEmployment.be  This test is no t yet approved or cleared by the [place] FDA and  has been authorized for detection and/or diagnosis of SARS-CoV-2 by FDA under an Emergency Use Authorization (EUA). This EUA will remain  in effect (meaning this test can be used) for the duration of the COVID-19 declaration under Section 564(b)(1) of the Act, 21 U.S.C.section 360bbb-3(b)(1), unless the authorization is terminated  or revoked sooner.       Influenza A by PCR NEGATIVE NEGATIVE Final   Influenza B by PCR NEGATIVE NEGATIVE Final    Comment: (NOTE) The Xpert Xpress SARS-CoV-2/FLU/RSV plus assay is intended as an aid in the diagnosis of influenza from Nasopharyngeal swab specimens and should not be used as a sole basis for treatment. Nasal washings and aspirates are unacceptable for Xpert Xpress SARS-CoV-2/FLU/RSV testing.  Fact Sheet for Patients: EntrepreneurPulse.com.au  Fact Sheet for Healthcare Providers:  IncredibleEmployment.be  This test is not yet approved or cleared by the [place] and has been authorized for detection and/or diagnosis of SARS-CoV-2 by FDA under an Emergency Use  Authorization (EUA). This EUA will remain in effect (meaning this test can be used) for the duration of the COVID-19 declaration under Section 564(b)(1) of the Act, 21 U.S.C. section 360bbb-3(b)(1), unless the authorization is terminated or revoked.  Performed at [facility], [place] [address] [place], [place] [address]   Urine Culture     Status: Abnormal   Collection Time: 01/03/22  3:56 AM   Specimen: Urine  Result Value Ref Range Status   Specimen Description IN/OUT CATH URINE  Final   Special Requests   Final    NONE Performed at [facility], [place] [address] [place], [place] [address]    Culture 40,000 COLONIES/mL ESCHERICHIA COLI (A)  Final   Report Status 01/05/2022 FINAL  Final   Organism ID, Bacteria ESCHERICHIA COLI (A)  Final      Susceptibility   Escherichia coli - MIC*    AMPICILLIN >=32 RESISTANT Resistant     CEFAZOLIN <=4 SENSITIVE Sensitive     CEFEPIME <=0.12 SENSITIVE Sensitive     CEFTRIAXONE <=0.25 SENSITIVE Sensitive     CIPROFLOXACIN <=0.25 SENSITIVE Sensitive     GENTAMICIN <=1 SENSITIVE Sensitive     IMIPENEM <=0.25 SENSITIVE Sensitive     NITROFURANTOIN <=16 SENSITIVE Sensitive     TRIMETH/SULFA >=320 RESISTANT Resistant     AMPICILLIN/SULBACTAM >=32 RESISTANT Resistant     PIP/TAZO 64 INTERMEDIATE Intermediate     * 40,000 COLONIES/mL ESCHERICHIA COLI  MRSA Next Gen by PCR, Nasal     Status: None   Collection Time: 01/03/22  5:37 AM   Specimen: Nasal Mucosa; Nasal Swab  Result Value Ref Range Status   MRSA by PCR Next Gen NOT DETECTED NOT DETECTED Final    Comment: (NOTE) The GeneXpert MRSA Assay (FDA approved for NASAL specimens only), is one component of a comprehensive MRSA colonization surveillance program. It is not intended to diagnose MRSA infection nor to guide or monitor treatment for MRSA infections. Test performance is not FDA approved in patients less than 18 years old. Performed at [facility], [place] [address] [place], [place] [address]          Radiology Studies: VAS [place] ABI WITH/WO TBI  Result Date: 01/07/2022  LOWER EXTREMITY DOPPLER STUDY Patient Name:  RADEN BYINGTON  Date of Exam:   01/07/2022 Medical Rec #: [id number]     Accession #:    [id number] Date of Birth: 16-Apr-1956     Patient Gender: M Patient Age:   89 years Exam Location:  [facility] Procedure:      VAS [place] ABI WITH/WO TBI Referring Phys: Edwinna Rochette --------------------------------------------------------------------------------  Indications: Peripheral artery disease, and Pain in toes, bilateral. High Risk Factors: Current smoker. Other Factors: Septic shock.  Comparison Study: No previous exam noted. Performing Technologist: Bobetta Lime BS, RVT  Examination Guidelines: A complete evaluation includes at minimum, Doppler waveform signals and systolic blood pressure reading at the level of bilateral brachial, anterior tibial, and posterior tibial arteries, when vessel segments are accessible. Bilateral testing is considered an integral part of a complete examination. Photoelectric Plethysmograph (PPG) waveforms and toe systolic pressure readings are included as required and additional duplex testing as needed. Limited examinations for reoccurring indications may be performed as noted.  ABI Findings: +---------+------------------+-----+---------+---------+ Right  Rt Pressure (mmHg)IndexWaveform Comment   +---------+------------------+-----+---------+---------+ Brachial 157                    triphasic          +---------+------------------+-----+---------+---------+ PTA      183               1.12 triphasicHyperemic +---------+------------------+-----+---------+---------+ DP       204               1.25 triphasicHyperemic +---------+------------------+-----+---------+---------+ Great Toe135               0.83                    +---------+------------------+-----+---------+---------+  +---------+------------------+-----+---------+---------+ Left     Lt Pressure (mmHg)IndexWaveform Comment   +---------+------------------+-----+---------+---------+ Brachial 163                    triphasic          +---------+------------------+-----+---------+---------+ PTA      205               1.26 triphasicHyperemic +---------+------------------+-----+---------+---------+ DP       196               1.20 triphasicHyperemic +---------+------------------+-----+---------+---------+ Esau Grew                0.51                    +---------+------------------+-----+---------+---------+  Summary: Right: Resting right ankle-brachial index is within normal range. No evidence of significant right lower extremity arterial disease. The right toe-brachial index is normal. Left: Resting left ankle-brachial index is within normal range. No evidence of significant left lower extremity arterial disease. The left toe-brachial index is abnormal. *See table(s) above for measurements and observations.  Electronically signed by Orlie Pollen on 01/07/2022 at 5:35:34 PM.    Final         Scheduled Meds:  acyclovir ointment   Topical Q3H   chlorhexidine  15 mL Mouth Rinse BID   Chlorhexidine Gluconate Cloth  6 each Topical Daily   heparin injection (subcutaneous)  5,000 Units Subcutaneous Q8H   mouth rinse  15 mL Mouth Rinse q12n4p   multivitamin with minerals  1 tablet Oral Daily   potassium chloride  30 mEq Oral Q6H   sodium chloride flush  10-40 mL Intracatheter Q12H   Continuous Infusions:  sodium chloride Stopped (01/03/22 2157)   cefTRIAXone (ROCEPHIN)  IV Stopped (01/08/22 1736)     LOS: 6 days      Phillips Climes, MD [facility]   To contact the attending provider between 7A-7P or the covering provider during after hours 7P-7A, please log into the web site [email or web address] and access using universal [place] password for that web site. If you do not have  the password, please call the hospital operator.  01/09/2022, 2:42 PM

## 2022-01-09 NOTE — Progress Notes (Signed)
Inpatient Rehab Admissions:  Inpatient Rehab Consult received.  I met with patient at the bedside for rehabilitation assessment and to discuss goals and expectations of an inpatient rehab admission.  Pt acknowledged understanding. Pt informed AC he prefers to go home and not pursue CIR. TOC made aware.  Signed: Lauren Graves Madden, MS, CCC-SLP Admissions Coordinator [phone]   

## 2022-01-10 LAB — CBC
HCT: 27.2 % — ABNORMAL LOW (ref 39.0–52.0)
Hemoglobin: 8.8 g/dL — ABNORMAL LOW (ref 13.0–17.0)
MCH: 28.4 pg (ref 26.0–34.0)
MCHC: 32.4 g/dL (ref 30.0–36.0)
MCV: 87.7 fL (ref 80.0–100.0)
Platelets: 127 10*3/uL — ABNORMAL LOW (ref 150–400)
RBC: 3.1 MIL/uL — ABNORMAL LOW (ref 4.22–5.81)
RDW: 16.7 % — ABNORMAL HIGH (ref 11.5–15.5)
WBC: 18.4 10*3/uL — ABNORMAL HIGH (ref 4.0–10.5)
nRBC: 0 % (ref 0.0–0.2)

## 2022-01-10 LAB — BASIC METABOLIC PANEL
Anion gap: 11 (ref 5–15)
BUN: 27 mg/dL — ABNORMAL HIGH (ref 8–23)
CO2: 23 mmol/L (ref 22–32)
Calcium: 8.3 mg/dL — ABNORMAL LOW (ref 8.9–10.3)
Chloride: 108 mmol/L (ref 98–111)
Creatinine, Ser: 1.38 mg/dL — ABNORMAL HIGH (ref 0.61–1.24)
GFR, Estimated: 57 mL/min — ABNORMAL LOW (ref >60)
Glucose, Bld: 125 mg/dL — ABNORMAL HIGH (ref 70–99)
Potassium: 3.3 mmol/L — ABNORMAL LOW (ref 3.5–5.1)
Sodium: 142 mmol/L (ref 135–145)

## 2022-01-10 LAB — HEPATIC FUNCTION PANEL
ALT: 124 U/L — ABNORMAL HIGH (ref 0–44)
AST: 59 U/L — ABNORMAL HIGH (ref 15–41)
Albumin: 2.4 g/dL — ABNORMAL LOW (ref 3.5–5.0)
Alkaline Phosphatase: 224 U/L — ABNORMAL HIGH (ref 38–126)
Bilirubin, Direct: 1.4 mg/dL — ABNORMAL HIGH (ref 0.0–0.2)
Indirect Bilirubin: 1.3 mg/dL — ABNORMAL HIGH (ref 0.3–0.9)
Total Bilirubin: 2.7 mg/dL — ABNORMAL HIGH (ref 0.3–1.2)
Total Protein: 5.6 g/dL — ABNORMAL LOW (ref 6.5–8.1)

## 2022-01-10 LAB — PHOSPHORUS: Phosphorus: 2.7 mg/dL (ref 2.5–4.6)

## 2022-01-10 MED ORDER — POTASSIUM CHLORIDE CRYS ER 20 MEQ PO TBCR
30.0000 meq | EXTENDED_RELEASE_TABLET | Freq: Four times a day (QID) | ORAL | Status: AC
  Administered 2022-01-10 (×3): 30 meq via ORAL
  Filled 2022-01-10 (×3): qty 1

## 2022-01-10 NOTE — Progress Notes (Signed)
Inpatient Rehab Admissions Coordinator:  After talking with TOC about the decreased likelihood that pt would receive therapy benefits d/t being uninsured, spoke with pt again at bedside. Explained the decreased likelihood of therapy to pt d/t his insurance status. He acknowledged understanding and is still not interested in pursuing CIR. TOC made aware.   Gayland Curry, [place], [place] Admissions Coordinator [phone]

## 2022-01-10 NOTE — Progress Notes (Signed)
PROGRESS NOTE    Curtis [name]  HDQ:[id number] DOB: April 08, 1956 DOA: 01/03/2022 PCP: Pcp, No   Chief Complaint  Patient presents with   Unresponsive   Altered Mental Status    Brief Narrative:   Curtis [name] is a 66 year old gentleman with a history of rectosigmoid cancer status post resection who completed chemotherapy in December 2022 who presents with 2 days of chills and vomiting.  He did not have a fever at home.  He denies abdominal pain, change in stool/ostomy output, cough, sputum production, significant upper respiratory symptoms, wounds, new rashes.  He has chronic skin breakdown that is unchanged around his ostomy site.  He has a history of dental caries that has been more significant since chemotherapy.  He has not seen a dentist recently.  No recent dental changes. Per his son he has been breathing heavily and has been drifting in and out of sleep.  Upon arrival to the ED he was febrile to 101.7.  He was hypotensive requiring initiation of norepinephrine.  CVC placed in the ED. patient admitted to ICU  Significant events 6/11 admission-started on nor epi, cefepime, Vanco in the ED. Underwent right ureteral stent placement.  Surgery consulted for fluid collection in the presacral area.  Intubated and on pressors 6/12-blood and urine cultures growing E. Coli 6/13- off pressors. On PSV weans, extubated 6/13 UE duplex neg for DVT. Echo EF 45-50%    Assessment & Plan:   Principal Problem:   Septic shock ([place]) Active Problems:   Rectal cancer ([place])   E coli bacteremia   Thrombocytopenia ([facility])   Septic shock secondary to E. coli bacteremia and urosepsis.  With infected right ureteral stone present on admission  - s/p ureteral stent placement by Dr. Milford Cage 6/11 - Appreciate input from surgery who feels that the pre sacral fluid collection is likely not a big contributor. -Antibiotics management per ID, currently on IV Rocephin. -White blood cell count and procalcitonin is  improving, continue to monitor closely -To follow with urology as an outpatient after discharge. -Leukocytosis and procalcitonin trending down which is reassuring.   Acute resp failure - resolved, s/p extubation 6/13. Bronchial hygiene. Mobilize. Wean oxygen as tolerated Encouraged to use incentive spirometry and flutter valve   AKI -Due to sepsis, hypotension -Continues improving, peak at 3.19, it was 1.4 today, avoid nephrotoxic medications -We will DC Foley catheter today, monitor closely for retention  Hypokalemia. - Repleting   History of rectosigmoid cancer with ostomy bag - Routine ostomy care. -Patient with fluid collection, general surgery input greatly appreciated, at this point no need for sampling given septic work-up related to bacteremia and infected kidney stones    Thrombocytopenia, elevated INR due to sepsis. -  No in DIC as fibrinogen is high. - Elevated d dimer is concerning but suspicion for PE is low and echo reassuring. LE duplex also negative. 4T score is low, SRA and HIT within normal limit, no indication for HIT   Elevated LFTs - improved. Liver US negative. Likely from shock liver due to septic shock Improving  Hypokalemia/hypophosphatemia -Repleted  Left toes cyanosis -Dopplers negative for DVT, Patient  with good peripheral pulses, ABIs with no significant vascular disease, vascular surgery input appreciated.     Herpes Labialis -Treated with Valtrex, continue with acyclovir ointment.  Nutrition. SLP eval.   DVT prophylaxis: Heparin Code Status: Full Family Communication: None at bedside Disposition:   Status is: Inpatient    Consultants:  Urology  PCCM Vascular ID   Subjective:  Reports generalized weakness, fatigue, reports reports good appetite, no nausea, no vomiting.  Objective: Vitals:   01/09/22 2001 01/09/22 2304 01/10/22 0320 01/10/22 0459  BP: 140/69 (!) 143/61 139/67   Pulse: 88 83 90   Resp: '20 20 17   '$ Temp:  98.2 F (36.8 C) 98 F (36.7 C) 98.5 F (36.9 C)   TempSrc: Oral Oral Oral   SpO2: 94% 95% 92%   Weight:    90.5 kg  Height:        Intake/Output Summary (Last 24 hours) at 01/10/2022 1228 Last data filed at 01/10/2022 0200 Gross per 24 hour  Intake 1510.95 ml  Output 2150 ml  Net -639.05 ml   Filed Weights   01/06/22 0400 01/07/22 0500 01/10/22 0459  Weight: 98 kg 98.9 kg 90.5 kg    Examination:  Awake Alert, Oriented X 3, No new F.N deficits, Normal affect Symmetrical Chest wall movement, Good air movement bilaterally, CTAB RRR,No Gallops,Rubs or new Murmurs, No Parasternal Heave +ve B.Sounds, Abd Soft, No tenderness, No rebound - guarding or rigidity. No Cyanosis, Clubbing or edema, No new Rash or bruise  .iliostomy + Lower extremity edema has improved, no significant change with tip of his left toe discoloration     Data Reviewed: I have personally reviewed following labs and imaging studies  CBC: Recent Labs  Lab 01/07/22 0407 01/07/22 1411 01/08/22 0055 01/09/22 0340 01/10/22 0323  WBC 23.8* 25.8* 23.4* 20.0* 18.4*  NEUTROABS  --  23.4*  --   --   --   HGB 8.6* 8.9* 9.3* 8.8* 8.8*  HCT 25.8* 27.0* 26.8* 26.4* 27.2*  MCV 87.5 87.7 85.6 86.8 87.7  PLT 57* 69* 71* 90* 127*    Basic Metabolic Panel: Recent Labs  Lab 01/04/22 1742 01/05/22 0339 01/05/22 1717 01/06/22 0345 01/07/22 0407 01/07/22 1411 01/08/22 0055 01/09/22 0340 01/10/22 0323  NA  --  134*  --    < > 143 144 143 143 142  K  --  4.1  --    < > 2.9* 3.2* 3.6 3.0* 3.3*  CL  --  104  --    < > 109 111 112* 107 108  CO2  --  16*  --    < > '22 23 22 23 23  '$ GLUCOSE  --  121*  --    < > 95 111* 138* 120* 125*  BUN  --  56*  --    < > 51* 48* 43* 34* 27*  CREATININE  --  2.89*  --    < > 2.05* 1.88* 1.66* 1.40* 1.38*  CALCIUM  --  7.3*  --    < > 8.0* 8.0* 8.0* 8.1* 8.3*  MG 2.4 2.2 2.2  --  2.1  --  1.8  --   --   PHOS 4.9* 4.5 3.1  --  2.3*  --  3.3 2.8 2.7   < > = values in this  interval not displayed.    GFR: Estimated Creatinine Clearance: 61.2 mL/min (A) (by C-G formula based on SCr of 1.38 mg/dL (H)).  Liver Function Tests: Recent Labs  Lab 01/04/22 0108 01/05/22 0339 01/06/22 0345 01/07/22 1411 01/10/22 0323  AST 322* 1,360* 525* 296* 59*  ALT 211* 1,020* 828* 390* 124*  ALKPHOS 44 65 167* 237* 224*  BILITOT 1.3* 2.0* 3.2* 3.8* 2.7*  PROT 5.0* 4.7* 5.1* 5.2* 5.6*  ALBUMIN 2.5* 2.2* 2.3* 2.3* 2.4*    CBG: Recent Labs  Lab 01/07/22 1206  01/07/22 1607 01/07/22 2046 01/08/22 0117 01/08/22 1222  GLUCAP 134* 126* 144* 148* 163*     Recent Results (from the past 240 hour(s))  Culture, blood (Routine x 2)     Status: Abnormal   Collection Time: 01/02/22  8:30 PM   Specimen: BLOOD RIGHT FOREARM  Result Value Ref Range Status   Specimen Description BLOOD RIGHT FOREARM  Final   Special Requests   Final    BOTTLES DRAWN AEROBIC AND ANAEROBIC Blood Culture results may not be optimal due to an inadequate volume of blood received in culture bottles   Culture  Setup Time   Final    GRAM NEGATIVE RODS IN BOTH AEROBIC AND ANAEROBIC BOTTLES CRITICAL VALUE NOTED.  VALUE IS CONSISTENT WITH PREVIOUSLY REPORTED AND CALLED VALUE. Performed at [facility], [place] [address] [place], [place] [address]    Culture ESCHERICHIA COLI (A)  Final   Report Status 01/05/2022 FINAL  Final  Culture, blood (Routine x 2)     Status: Abnormal   Collection Time: 01/03/22  2:35 AM   Specimen: BLOOD LEFT FOREARM  Result Value Ref Range Status   Specimen Description BLOOD LEFT FOREARM  Final   Special Requests   Final    BOTTLES DRAWN AEROBIC AND ANAEROBIC Blood Culture adequate volume   Culture  Setup Time   Final    GRAM NEGATIVE RODS IN BOTH AEROBIC AND ANAEROBIC BOTTLES CRITICAL RESULT CALLED TO, READ BACK BY AND VERIFIED WITH: P. DANG PHARMD, AT 1811 01/03/22 D. VANHOOK Performed at [facility], [place] [address], [place], [place] [address]    Culture  ESCHERICHIA COLI (A)  Final   Report Status 01/05/2022 FINAL  Final   Organism ID, Bacteria ESCHERICHIA COLI  Final      Susceptibility   Escherichia coli - MIC*    AMPICILLIN >=32 RESISTANT Resistant     CEFAZOLIN <=4 SENSITIVE Sensitive     CEFEPIME <=0.12 SENSITIVE Sensitive     CEFTAZIDIME <=1 SENSITIVE Sensitive     CEFTRIAXONE <=0.25 SENSITIVE Sensitive     CIPROFLOXACIN <=0.25 SENSITIVE Sensitive     GENTAMICIN <=1 SENSITIVE Sensitive     IMIPENEM <=0.25 SENSITIVE Sensitive     TRIMETH/SULFA >=320 RESISTANT Resistant     AMPICILLIN/SULBACTAM >=32 RESISTANT Resistant     PIP/TAZO >=128 RESISTANT Resistant     * ESCHERICHIA COLI  Blood Culture ID Panel (Reflexed)     Status: Abnormal   Collection Time: 01/03/22  2:35 AM  Result Value Ref Range Status   Enterococcus faecalis NOT DETECTED NOT DETECTED Final   Enterococcus Faecium NOT DETECTED NOT DETECTED Final   Listeria monocytogenes NOT DETECTED NOT DETECTED Final   Staphylococcus species NOT DETECTED NOT DETECTED Final   Staphylococcus aureus (BCID) NOT DETECTED NOT DETECTED Final   Staphylococcus epidermidis NOT DETECTED NOT DETECTED Final   Staphylococcus lugdunensis NOT DETECTED NOT DETECTED Final   Streptococcus species NOT DETECTED NOT DETECTED Final   Streptococcus agalactiae NOT DETECTED NOT DETECTED Final   Streptococcus pneumoniae NOT DETECTED NOT DETECTED Final   Streptococcus pyogenes NOT DETECTED NOT DETECTED Final   A.calcoaceticus-baumannii NOT DETECTED NOT DETECTED Final   Bacteroides fragilis NOT DETECTED NOT DETECTED Final   Enterobacterales DETECTED (A) NOT DETECTED Final    Comment: Enterobacterales represent a large order of gram negative bacteria, not a single organism. CRITICAL RESULT CALLED TO, READ BACK BY AND VERIFIED WITH: P. DANG PHARMD, AT 1811 01/03/22 D. VANHOOK    Enterobacter cloacae  complex NOT DETECTED NOT DETECTED Final   Escherichia coli DETECTED (A) NOT DETECTED Final    Comment:  CRITICAL RESULT CALLED TO, READ BACK BY AND VERIFIED WITH: P. DANG PHARMD, AT 1811 01/03/22 D. VANHOOK    Klebsiella aerogenes NOT DETECTED NOT DETECTED Final   Klebsiella oxytoca NOT DETECTED NOT DETECTED Final   Klebsiella pneumoniae NOT DETECTED NOT DETECTED Final   Proteus species NOT DETECTED NOT DETECTED Final   Salmonella species NOT DETECTED NOT DETECTED Final   Serratia marcescens NOT DETECTED NOT DETECTED Final   Haemophilus influenzae NOT DETECTED NOT DETECTED Final   Neisseria meningitidis NOT DETECTED NOT DETECTED Final   Pseudomonas aeruginosa NOT DETECTED NOT DETECTED Final   Stenotrophomonas maltophilia NOT DETECTED NOT DETECTED Final   Candida albicans NOT DETECTED NOT DETECTED Final   Candida auris NOT DETECTED NOT DETECTED Final   Candida glabrata NOT DETECTED NOT DETECTED Final   Candida krusei NOT DETECTED NOT DETECTED Final   Candida parapsilosis NOT DETECTED NOT DETECTED Final   Candida tropicalis NOT DETECTED NOT DETECTED Final   Cryptococcus neoformans/gattii NOT DETECTED NOT DETECTED Final   CTX-M ESBL NOT DETECTED NOT DETECTED Final   Carbapenem resistance IMP NOT DETECTED NOT DETECTED Final   Carbapenem resistance KPC NOT DETECTED NOT DETECTED Final   Carbapenem resistance NDM NOT DETECTED NOT DETECTED Final   Carbapenem resist OXA 48 LIKE NOT DETECTED NOT DETECTED Final   Carbapenem resistance VIM NOT DETECTED NOT DETECTED Final    Comment: Performed at [facility], [address] [place], [place] [address]  Resp Panel by RT-PCR (Flu A&B, Covid) Anterior Nasal Swab     Status: None   Collection Time: 01/03/22  2:40 AM   Specimen: Anterior Nasal Swab  Result Value Ref Range Status   SARS Coronavirus 2 by RT PCR NEGATIVE NEGATIVE Final    Comment: (NOTE) SARS-CoV-2 target nucleic acids are NOT DETECTED.  The SARS-CoV-2 RNA is generally detectable in upper respiratory specimens during the acute phase of infection. The lowest concentration of  SARS-CoV-2 viral copies this assay can detect is 138 copies/mL. A negative result does not preclude SARS-Cov-2 infection and should not be used as the sole basis for treatment or other patient management decisions. A negative result may occur with  improper specimen collection/handling, submission of specimen other than nasopharyngeal swab, presence of viral mutation(s) within the areas targeted by this assay, and inadequate number of viral copies(<138 copies/mL). A negative result must be combined with clinical observations, patient history, and epidemiological information. The expected result is Negative.  Fact Sheet for Patients:  EntrepreneurPulse.com.au  Fact Sheet for Healthcare Providers:  IncredibleEmployment.be  This test is no t yet approved or cleared by the [place] FDA and  has been authorized for detection and/or diagnosis of SARS-CoV-2 by FDA under an Emergency Use Authorization (EUA). This EUA will remain  in effect (meaning this test can be used) for the duration of the COVID-19 declaration under Section 564(b)(1) of the Act, 21 U.S.C.section 360bbb-3(b)(1), unless the authorization is terminated  or revoked sooner.       Influenza A by PCR NEGATIVE NEGATIVE Final   Influenza B by PCR NEGATIVE NEGATIVE Final    Comment: (NOTE) The Xpert Xpress SARS-CoV-2/FLU/RSV plus assay is intended as an aid in the diagnosis of influenza from Nasopharyngeal swab specimens and should not be used as a sole basis for treatment. Nasal washings and aspirates are unacceptable for Xpert Xpress SARS-CoV-2/FLU/RSV testing.  Fact Sheet for  Patients: EntrepreneurPulse.com.au  Fact Sheet for Healthcare Providers: IncredibleEmployment.be  This test is not yet approved or cleared by the [place] FDA and has been authorized for detection and/or diagnosis of SARS-CoV-2 by FDA under an Emergency Use  Authorization (EUA). This EUA will remain in effect (meaning this test can be used) for the duration of the COVID-19 declaration under Section 564(b)(1) of the Act, 21 U.S.C. section 360bbb-3(b)(1), unless the authorization is terminated or revoked.  Performed at [facility], [place] [address] [place], [place] [address]   Urine Culture     Status: Abnormal   Collection Time: 01/03/22  3:56 AM   Specimen: Urine  Result Value Ref Range Status   Specimen Description IN/OUT CATH URINE  Final   Special Requests   Final    NONE Performed at [facility], [place] [address], [place], [place] [address]    Culture 40,000 COLONIES/mL ESCHERICHIA COLI (A)  Final   Report Status 01/05/2022 FINAL  Final   Organism ID, Bacteria ESCHERICHIA COLI (A)  Final      Susceptibility   Escherichia coli - MIC*    AMPICILLIN >=32 RESISTANT Resistant     CEFAZOLIN <=4 SENSITIVE Sensitive     CEFEPIME <=0.12 SENSITIVE Sensitive     CEFTRIAXONE <=0.25 SENSITIVE Sensitive     CIPROFLOXACIN <=0.25 SENSITIVE Sensitive     GENTAMICIN <=1 SENSITIVE Sensitive     IMIPENEM <=0.25 SENSITIVE Sensitive     NITROFURANTOIN <=16 SENSITIVE Sensitive     TRIMETH/SULFA >=320 RESISTANT Resistant     AMPICILLIN/SULBACTAM >=32 RESISTANT Resistant     PIP/TAZO 64 INTERMEDIATE Intermediate     * 40,000 COLONIES/mL ESCHERICHIA COLI  MRSA Next Gen by PCR, Nasal     Status: None   Collection Time: 01/03/22  5:37 AM   Specimen: Nasal Mucosa; Nasal Swab  Result Value Ref Range Status   MRSA by PCR Next Gen NOT DETECTED NOT DETECTED Final    Comment: (NOTE) The GeneXpert MRSA Assay (FDA approved for NASAL specimens only), is one component of a comprehensive MRSA colonization surveillance program. It is not intended to diagnose MRSA infection nor to guide or monitor treatment for MRSA infections. Test performance is not FDA approved in patients less than 76 years old. Performed at [facility], [place] [address] [place], [place] [address]          Radiology Studies: No results found.      Scheduled Meds:  acyclovir ointment   Topical Q3H   chlorhexidine  15 mL Mouth Rinse BID   Chlorhexidine Gluconate Cloth  6 each Topical Daily   heparin injection (subcutaneous)  5,000 Units Subcutaneous Q8H   magic mouthwash  10 mL Oral QID   mouth rinse  15 mL Mouth Rinse q12n4p   multivitamin with minerals  1 tablet Oral Daily   potassium chloride  30 mEq Oral Q6H   sodium chloride flush  10-40 mL Intracatheter Q12H   Continuous Infusions:  sodium chloride Stopped (01/03/22 2157)   cefTRIAXone (ROCEPHIN)  IV 2 g (01/09/22 1530)   lactated ringers 75 mL/hr at 01/10/22 0436     LOS: 7 days      Phillips Climes, MD [facility]   To contact the attending provider between 7A-7P or the covering provider during after hours 7P-7A, please log into the web site [email or web address] and access using universal [place] password for that web site. If you do not have the password, please call  the hospital operator.  01/10/2022, 12:28 PM

## 2022-01-11 LAB — CBC
HCT: 25.8 % — ABNORMAL LOW (ref 39.0–52.0)
Hemoglobin: 8.6 g/dL — ABNORMAL LOW (ref 13.0–17.0)
MCH: 29 pg (ref 26.0–34.0)
MCHC: 33.3 g/dL (ref 30.0–36.0)
MCV: 86.9 fL (ref 80.0–100.0)
Platelets: 158 10*3/uL (ref 150–400)
RBC: 2.97 MIL/uL — ABNORMAL LOW (ref 4.22–5.81)
RDW: 16.8 % — ABNORMAL HIGH (ref 11.5–15.5)
WBC: 14.8 10*3/uL — ABNORMAL HIGH (ref 4.0–10.5)
nRBC: 0 % (ref 0.0–0.2)

## 2022-01-11 LAB — BASIC METABOLIC PANEL
Anion gap: 11 (ref 5–15)
BUN: 23 mg/dL (ref 8–23)
CO2: 22 mmol/L (ref 22–32)
Calcium: 8.1 mg/dL — ABNORMAL LOW (ref 8.9–10.3)
Chloride: 108 mmol/L (ref 98–111)
Creatinine, Ser: 1.16 mg/dL (ref 0.61–1.24)
GFR, Estimated: >60 mL/min (ref >60)
Glucose, Bld: 110 mg/dL — ABNORMAL HIGH (ref 70–99)
Potassium: 3.3 mmol/L — ABNORMAL LOW (ref 3.5–5.1)
Sodium: 141 mmol/L (ref 135–145)

## 2022-01-11 LAB — PHOSPHORUS: Phosphorus: 2.8 mg/dL (ref 2.5–4.6)

## 2022-01-11 MED ORDER — POTASSIUM CHLORIDE CRYS ER 20 MEQ PO TBCR: EXTENDED_RELEASE_TABLET | ORAL | 0 refills | Status: DC

## 2022-01-11 MED ORDER — POTASSIUM CHLORIDE CRYS ER 20 MEQ PO TBCR: 20.0000 meq | EXTENDED_RELEASE_TABLET | Freq: Two times a day (BID) | ORAL | 0 refills | Status: DC

## 2022-01-11 MED ORDER — CIPROFLOXACIN HCL 500 MG PO TABS: 500.0000 mg | ORAL_TABLET | Freq: Two times a day (BID) | ORAL | 0 refills | Status: AC

## 2022-01-11 MED ORDER — POTASSIUM CHLORIDE CRYS ER 20 MEQ PO TBCR
40.0000 meq | EXTENDED_RELEASE_TABLET | Freq: Four times a day (QID) | ORAL | Status: DC
  Administered 2022-01-11 (×2): 40 meq via ORAL
  Filled 2022-01-11 (×2): qty 2

## 2022-01-11 NOTE — Discharge Instructions (Signed)
Follow with Primary MD  in 7 days   Get CBC, CMP,  checked  by Primary MD next visit.    Activity: As tolerated with Full fall precautions use walker/cane & assistance as needed   Disposition Home    Diet: Regular diet   On your next visit with your primary care physician please Get Medicines reviewed and adjusted.   Please request your Prim.MD to go over all Hospital Tests and Procedure/Radiological results at the follow up, please get all Hospital records sent to your Prim MD by signing hospital release before you go home.   If you experience worsening of your admission symptoms, develop shortness of breath, life threatening emergency, suicidal or homicidal thoughts you must seek medical attention immediately by calling 911 or calling your MD immediately  if symptoms less severe.  You Must read complete instructions/literature along with all the possible adverse reactions/side effects for all the Medicines you take and that have been prescribed to you. Take any new Medicines after you have completely understood and accpet all the possible adverse reactions/side effects.   Do not drive, operating heavy machinery, perform activities at heights, swimming or participation in water activities or provide baby sitting services if your were admitted for syncope or siezures until you have seen by Primary MD or a Neurologist and advised to do so again.  Do not drive when taking Pain medications.    Do not take more than prescribed Pain, Sleep and Anxiety Medications  Special Instructions: If you have smoked or chewed Tobacco  in the last 2 yrs please stop smoking, stop any regular Alcohol  and or any Recreational drug use.  Wear Seat belts while driving.   Please note  You were cared for by a hospitalist during your hospital stay. If you have any questions about your discharge medications or the care you received while you were in the hospital after you are discharged, you can call the  unit and asked to speak with the hospitalist on call if the hospitalist that took care of you is not available. Once you are discharged, your primary care physician will handle any further medical issues. Please note that NO REFILLS for any discharge medications will be authorized once you are discharged, as it is imperative that you return to your primary care physician (or establish a relationship with a primary care physician if you do not have one) for your aftercare needs so that they can reassess your need for medications and monitor your lab values.  

## 2022-01-11 NOTE — Progress Notes (Signed)
[name] for Infectious Disease   Reason for visit: Follow up on E coli bacteremia  Interval History: WBC 14.8, remains afebrile; no diarrhea  Day 10 antibiotics  Physical Exam: Constitutional:  Vitals:   01/11/22 0600 01/11/22 0826  BP: (!) 148/58 139/63  Pulse: 76 86  Resp: 20 20  Temp: 98.3 F (36.8 C) 97.9 F (36.6 C)  SpO2: 94% 93%   patient appears in NAD Respiratory: Normal respiratory effort  Review of Systems: Constitutional: negative for fevers and chills  Lab Results  Component Value Date   WBC 14.8 (H) 01/11/2022   HGB 8.6 (L) 01/11/2022   HCT 25.8 (L) 01/11/2022   MCV 86.9 01/11/2022   PLT 158 01/11/2022    Lab Results  Component Value Date   CREATININE 1.16 01/11/2022   BUN 23 01/11/2022   NA 141 01/11/2022   K 3.3 (L) 01/11/2022   CL 108 01/11/2022   CO2 22 01/11/2022    Lab Results  Component Value Date   ALT 124 (H) 01/10/2022   AST 59 (H) 01/10/2022   ALKPHOS 224 (H) 01/10/2022     Microbiology: Recent Results (from the past 240 hour(s))  Culture, blood (Routine x 2)     Status: Abnormal   Collection Time: 01/02/22  8:30 PM   Specimen: BLOOD RIGHT FOREARM  Result Value Ref Range Status   Specimen Description BLOOD RIGHT FOREARM  Final   Special Requests   Final    BOTTLES DRAWN AEROBIC AND ANAEROBIC Blood Culture results may not be optimal due to an inadequate volume of blood received in culture bottles   Culture  Setup Time   Final    GRAM NEGATIVE RODS IN BOTH AEROBIC AND ANAEROBIC BOTTLES CRITICAL VALUE NOTED.  VALUE IS CONSISTENT WITH PREVIOUSLY REPORTED AND CALLED VALUE. Performed at [facility], [place] [address], [place], [place] [address]    Culture ESCHERICHIA COLI (A)  Final   Report Status 01/05/2022 FINAL  Final  Culture, blood (Routine x 2)     Status: Abnormal   Collection Time: 01/03/22  2:35 AM   Specimen: BLOOD LEFT FOREARM  Result Value Ref Range Status   Specimen Description BLOOD LEFT FOREARM   Final   Special Requests   Final    BOTTLES DRAWN AEROBIC AND ANAEROBIC Blood Culture adequate volume   Culture  Setup Time   Final    GRAM NEGATIVE RODS IN BOTH AEROBIC AND ANAEROBIC BOTTLES CRITICAL RESULT CALLED TO, READ BACK BY AND VERIFIED WITH: P. DANG PHARMD, AT 1811 01/03/22 D. VANHOOK Performed at [facility], [place] [address], [place], [place] [address]    Culture ESCHERICHIA COLI (A)  Final   Report Status 01/05/2022 FINAL  Final   Organism ID, Bacteria ESCHERICHIA COLI  Final      Susceptibility   Escherichia coli - MIC*    AMPICILLIN >=32 RESISTANT Resistant     CEFAZOLIN <=4 SENSITIVE Sensitive     CEFEPIME <=0.12 SENSITIVE Sensitive     CEFTAZIDIME <=1 SENSITIVE Sensitive     CEFTRIAXONE <=0.25 SENSITIVE Sensitive     CIPROFLOXACIN <=0.25 SENSITIVE Sensitive     GENTAMICIN <=1 SENSITIVE Sensitive     IMIPENEM <=0.25 SENSITIVE Sensitive     TRIMETH/SULFA >=320 RESISTANT Resistant     AMPICILLIN/SULBACTAM >=32 RESISTANT Resistant     PIP/TAZO >=128 RESISTANT Resistant     * ESCHERICHIA COLI  Blood Culture ID Panel (Reflexed)     Status: Abnormal  Collection Time: 01/03/22  2:35 AM  Result Value Ref Range Status   Enterococcus faecalis NOT DETECTED NOT DETECTED Final   Enterococcus Faecium NOT DETECTED NOT DETECTED Final   Listeria monocytogenes NOT DETECTED NOT DETECTED Final   Staphylococcus species NOT DETECTED NOT DETECTED Final   Staphylococcus aureus (BCID) NOT DETECTED NOT DETECTED Final   Staphylococcus epidermidis NOT DETECTED NOT DETECTED Final   Staphylococcus lugdunensis NOT DETECTED NOT DETECTED Final   Streptococcus species NOT DETECTED NOT DETECTED Final   Streptococcus agalactiae NOT DETECTED NOT DETECTED Final   Streptococcus pneumoniae NOT DETECTED NOT DETECTED Final   Streptococcus pyogenes NOT DETECTED NOT DETECTED Final   A.calcoaceticus-baumannii NOT DETECTED NOT DETECTED Final   Bacteroides fragilis NOT DETECTED NOT DETECTED Final    Enterobacterales DETECTED (A) NOT DETECTED Final    Comment: Enterobacterales represent a large order of gram negative bacteria, not a single organism. CRITICAL RESULT CALLED TO, READ BACK BY AND VERIFIED WITH: P. DANG PHARMD, AT 1811 01/03/22 D. VANHOOK    Enterobacter cloacae complex NOT DETECTED NOT DETECTED Final   Escherichia coli DETECTED (A) NOT DETECTED Final    Comment: CRITICAL RESULT CALLED TO, READ BACK BY AND VERIFIED WITH: P. DANG PHARMD, AT 1811 01/03/22 D. VANHOOK    Klebsiella aerogenes NOT DETECTED NOT DETECTED Final   Klebsiella oxytoca NOT DETECTED NOT DETECTED Final   Klebsiella pneumoniae NOT DETECTED NOT DETECTED Final   Proteus species NOT DETECTED NOT DETECTED Final   Salmonella species NOT DETECTED NOT DETECTED Final   Serratia marcescens NOT DETECTED NOT DETECTED Final   Haemophilus influenzae NOT DETECTED NOT DETECTED Final   Neisseria meningitidis NOT DETECTED NOT DETECTED Final   Pseudomonas aeruginosa NOT DETECTED NOT DETECTED Final   Stenotrophomonas maltophilia NOT DETECTED NOT DETECTED Final   Candida albicans NOT DETECTED NOT DETECTED Final   Candida auris NOT DETECTED NOT DETECTED Final   Candida glabrata NOT DETECTED NOT DETECTED Final   Candida krusei NOT DETECTED NOT DETECTED Final   Candida parapsilosis NOT DETECTED NOT DETECTED Final   Candida tropicalis NOT DETECTED NOT DETECTED Final   Cryptococcus neoformans/gattii NOT DETECTED NOT DETECTED Final   CTX-M ESBL NOT DETECTED NOT DETECTED Final   Carbapenem resistance IMP NOT DETECTED NOT DETECTED Final   Carbapenem resistance KPC NOT DETECTED NOT DETECTED Final   Carbapenem resistance NDM NOT DETECTED NOT DETECTED Final   Carbapenem resist OXA 48 LIKE NOT DETECTED NOT DETECTED Final   Carbapenem resistance VIM NOT DETECTED NOT DETECTED Final    Comment: Performed at [facility], [address] [place], [place] [address]  Resp Panel by RT-PCR (Flu A&B, Covid) Anterior Nasal Swab      Status: None   Collection Time: 01/03/22  2:40 AM   Specimen: Anterior Nasal Swab  Result Value Ref Range Status   SARS Coronavirus 2 by RT PCR NEGATIVE NEGATIVE Final    Comment: (NOTE) SARS-CoV-2 target nucleic acids are NOT DETECTED.  The SARS-CoV-2 RNA is generally detectable in upper respiratory specimens during the acute phase of infection. The lowest concentration of SARS-CoV-2 viral copies this assay can detect is 138 copies/mL. A negative result does not preclude SARS-Cov-2 infection and should not be used as the sole basis for treatment or other patient management decisions. A negative result may occur with  improper specimen collection/handling, submission of specimen other than nasopharyngeal swab, presence of viral mutation(s) within the areas targeted by this assay, and inadequate number of viral copies(<138 copies/mL). A negative  result must be combined with clinical observations, patient history, and epidemiological information. The expected result is Negative.  Fact Sheet for Patients:  EntrepreneurPulse.com.au  Fact Sheet for Healthcare Providers:  IncredibleEmployment.be  This test is no t yet approved or cleared by the [place] FDA and  has been authorized for detection and/or diagnosis of SARS-CoV-2 by FDA under an Emergency Use Authorization (EUA). This EUA will remain  in effect (meaning this test can be used) for the duration of the COVID-19 declaration under Section 564(b)(1) of the Act, 21 U.S.C.section 360bbb-3(b)(1), unless the authorization is terminated  or revoked sooner.       Influenza A by PCR NEGATIVE NEGATIVE Final   Influenza B by PCR NEGATIVE NEGATIVE Final    Comment: (NOTE) The Xpert Xpress SARS-CoV-2/FLU/RSV plus assay is intended as an aid in the diagnosis of influenza from Nasopharyngeal swab specimens and should not be used as a sole basis for treatment. Nasal washings and aspirates are  unacceptable for Xpert Xpress SARS-CoV-2/FLU/RSV testing.  Fact Sheet for Patients: EntrepreneurPulse.com.au  Fact Sheet for Healthcare Providers: IncredibleEmployment.be  This test is not yet approved or cleared by the [place] FDA and has been authorized for detection and/or diagnosis of SARS-CoV-2 by FDA under an Emergency Use Authorization (EUA). This EUA will remain in effect (meaning this test can be used) for the duration of the COVID-19 declaration under Section 564(b)(1) of the Act, 21 U.S.C. section 360bbb-3(b)(1), unless the authorization is terminated or revoked.  Performed at [facility], [place] [address] [place], [place] [address]   Urine Culture     Status: Abnormal   Collection Time: 01/03/22  3:56 AM   Specimen: Urine  Result Value Ref Range Status   Specimen Description IN/OUT CATH URINE  Final   Special Requests   Final    NONE Performed at [facility], [place] [address], [place], [place] [address]    Culture 40,000 COLONIES/mL ESCHERICHIA COLI (A)  Final   Report Status 01/05/2022 FINAL  Final   Organism ID, Bacteria ESCHERICHIA COLI (A)  Final      Susceptibility   Escherichia coli - MIC*    AMPICILLIN >=32 RESISTANT Resistant     CEFAZOLIN <=4 SENSITIVE Sensitive     CEFEPIME <=0.12 SENSITIVE Sensitive     CEFTRIAXONE <=0.25 SENSITIVE Sensitive     CIPROFLOXACIN <=0.25 SENSITIVE Sensitive     GENTAMICIN <=1 SENSITIVE Sensitive     IMIPENEM <=0.25 SENSITIVE Sensitive     NITROFURANTOIN <=16 SENSITIVE Sensitive     TRIMETH/SULFA >=320 RESISTANT Resistant     AMPICILLIN/SULBACTAM >=32 RESISTANT Resistant     PIP/TAZO 64 INTERMEDIATE Intermediate     * 40,000 COLONIES/mL ESCHERICHIA COLI  MRSA Next Gen by PCR, Nasal     Status: None   Collection Time: 01/03/22  5:37 AM   Specimen: Nasal Mucosa; Nasal Swab  Result Value Ref Range Status   MRSA by PCR Next Gen NOT DETECTED NOT DETECTED Final     Comment: (NOTE) The GeneXpert MRSA Assay (FDA approved for NASAL specimens only), is one component of a comprehensive MRSA colonization surveillance program. It is not intended to diagnose MRSA infection nor to guide or monitor treatment for MRSA infections. Test performance is not FDA approved in patients less than 42 years old. Performed at [facility], [place] [address] [place], [place] [address]     Impression/Plan:  1. E coli bacteremia - in the setting of an obstructing stone  with ureteral stent placement on 6/11.  Doing well now with no fever and leukocytosis trending down appropriately.  At this point I recommend he continue with 14 days total (5 more days) of antibiotics and can use cipro 500 mg twice a day.    2.  Acute kidney injury - creat has trended down and now wnl at 1.16.  As above, will use full dose cipro and for just 5 days.  He should have a follow up creat by his PCP after discharge.    3.  Thrombocytopenia - likely from sepsis and now wnl at 158 this am.    I will sign off, call with any questions.

## 2022-01-11 NOTE — Progress Notes (Signed)
Patient does not like food provided and wants to go home soon.  He only drinks water and lemonade.  He has large amounts of liquid green output from ostomy.  Ostomy appliance has been changed multiple times over last 24  hours due to improper fit.  Ostomy nurse consulted.

## 2022-01-11 NOTE — TOC Transition Note (Signed)
Transition of Care [facility]) - CM/SW Discharge Note   Patient Details  Name: Raghav Verrilli MRN: [id number] Date of Birth: 09-May-1956  [facility] CM/SW Contact:  [name], RN Phone Number: 01/11/2022, 2:19 PM   Clinical Narrative:    Patient is for dc today, he lives with son.  He states his daughter will transport him home.  He states that he does not want to be set up with outpatient physical therapy at this time.  He has an follow up apt with [facility] on 6/21 at 2:30, he states he will go to the apt.  NCM sent him a good RX coupon, and also informed him that he can get the good RX app on his phone as well.     Final next level of care: Home/Self Care Barriers to Discharge: No Barriers Identified   Patient Goals and CMS Choice Patient states their goals for this hospitalization and ongoing recovery are:: return home   Choice offered to / list presented to : NA  Discharge Placement                       Discharge Plan and Services In-house Referral: NA Discharge Planning Services: CM Consult Post Acute Care Choice: NA            DME Agency: NA                  Social Determinants of Health (SDOH) Interventions     Readmission Risk Interventions    01/11/2022    2:14 PM  Readmission Risk Prevention Plan  Transportation Screening Complete  PCP or Specialist Appt within 3-5 Days Complete  HRI or [place] Complete  Social Work Consult for [place] Planning/Counseling Complete  Palliative Care Screening Not Applicable  Medication Review [employer]) Complete

## 2022-01-11 NOTE — Progress Notes (Signed)
TRH night cross cover note:   I was contacted by RN requesting clarification regarding need for continuation of telemetry monitoring.  RN conveys that the patient is now MedSurg status, although he still does have an order for cardiac monitoring.  Per chart review, it appears that his heart rates and blood pressure been stable throughout the day, with heart rates in the 70s to 80s and normotensive blood pressures throughout. No CP or SOB.  I conveyed that it is okay to leave the patient off of telemetry overnight, and discontinued existing order for cardiac monitoring.     Babs Bertin, DO Hospitalist

## 2022-01-11 NOTE — Progress Notes (Signed)
Physical Therapy Treatment Patient Details Name: Curtis [name] MRN: [id number] DOB: [birth year] Today's Date: 01/11/2022   History of Present Illness Pt is a 66 y/o male admitted6/11 secondary to chills/vomiting and hypotension. Pt found to have urosepsis and R uretal stone. Pt is s/p R uretal stent and cystoscopy on 6/11 and remained intubated. Pt was extubated on 6/13. Pt found to have L toe discoloration, however, per vascular, had good pulses. PMH includes rectosigmoid cancer s/p resection with ostomy.    PT Comments    Pt is making good progress towards his goals today. Pt continues to require increased cuing to wait until PT is ready. Pt is mod I for bed mobility, supervision for transfers and min guard for ambulation and stairs. PT changing recommendation to Outpatient PT. PT will continue to follow acutely.    Recommendations for follow up therapy are one component of a multi-disciplinary discharge planning process, led by the attending physician.  Recommendations may be updated based on patient status, additional functional criteria and insurance authorization.  Follow Up Recommendations  Outpatient PT     Assistance Recommended at Discharge Frequent or constant Supervision/Assistance  Patient can return home with the following A little help with walking and/or transfers;A little help with bathing/dressing/bathroom;Help with stairs or ramp for entrance;Assist for transportation;Assistance with cooking/housework   Equipment Recommendations  Rolling walker (2 wheels)       Precautions / Restrictions Precautions Precautions: Fall Precaution Comments: ostomy Restrictions Weight Bearing Restrictions: No     Mobility  Bed Mobility Overal bed mobility: Needs Assistance Bed Mobility: Supine to Sit     Supine to sit: Modified independent (Device/Increase time)     General bed mobility comments: use of rail to come to EoB    Transfers Overall transfer level: Needs  assistance Equipment used: None Transfers: Sit to/from Stand Sit to Stand: Supervision           General transfer comment: supervision for safety , good power up and self steady    Ambulation/Gait Ambulation/Gait assistance: Min guard Gait Distance (Feet): 10 Feet Assistive device: Rolling walker (2 wheels), None Gait Pattern/deviations: Step-through pattern, Decreased stride length, Trunk flexed Gait velocity: Decreased Gait velocity interpretation: <1.31 ft/sec, indicative of household ambulator   General Gait Details: min guard for safety, mildly unsteady,   Stairs Stairs: Yes Stairs assistance: Min guard Stair Management: Forwards, Backwards (walker over single step) Number of Stairs: 20 (up and back over one step with RW to simulate handrails) General stair comments: good power up and down, 3/4 DoE       Balance Overall balance assessment: Needs assistance Sitting-balance support: No upper extremity supported, Feet supported Sitting balance-Leahy Scale: Good     Standing balance support: No upper extremity supported Standing balance-Leahy Scale: Fair Standing balance comment: able to perform dynamic activity with no UE support                            Cognition Arousal/Alertness: Awake/alert Behavior During Therapy: Impulsive   Area of Impairment: Memory, Following commands, Safety/judgement, Awareness, Problem solving                       Following Commands: Follows multi-step commands inconsistently     Problem Solving: Requires verbal cues, Requires tactile cues General Comments: requires cues to wait           General Comments General comments (skin integrity, edema, etc.): VSS on RA  Pertinent Vitals/Pain Pain Assessment Pain Assessment: No/denies pain           PT Goals (current goals can now be found in the care plan section) Acute Rehab PT Goals Patient Stated Goal: to be independent PT Goal Formulation:  With patient Time For Goal Achievement: 01/22/22 Potential to Achieve Goals: Good Progress towards PT goals: Progressing toward goals    Frequency    Min 3X/week      PT Plan Discharge plan needs to be updated       AM-PAC PT "6 Clicks" Mobility   Outcome Measure  Help needed turning from your back to your side while in a flat bed without using bedrails?: None Help needed moving from lying on your back to sitting on the side of a flat bed without using bedrails?: None Help needed moving to and from a bed to a chair (including a wheelchair)?: None Help needed standing up from a chair using your arms (e.g., wheelchair or bedside chair)?: None Help needed to walk in hospital room?: A Little Help needed climbing 3-5 steps with a railing? : A Lot 6 Click Score: 21    End of Session Equipment Utilized During Treatment: Gait belt Activity Tolerance: Patient tolerated treatment well Patient left: in chair;with call bell/phone within reach;with chair alarm set Nurse Communication: Mobility status;Other (comment) (pt reports ostomy back felt loose) PT Visit Diagnosis: Unsteadiness on feet (R26.81);Muscle weakness (generalized) (M62.81);Difficulty in walking, not elsewhere classified (R26.2)     Time: 9030-0923 PT Time Calculation (min) (ACUTE ONLY): 22 min  Charges:  $Therapeutic Exercise: 8-22 mins $Therapeutic Activity: 8-22 mins                     Alayasia Breeding B. Migdalia Dk PT, DPT Acute Rehabilitation Services Please use secure chat or  Call Office [phone]    [place] 01/11/2022, 1:30 PM

## 2022-01-11 NOTE — Progress Notes (Signed)
VAT rounding for line care; patient's PAC due to be de-accessed and re-accessed + dressing change. Patient reports that he is going home today and wants to wait for change.Primary RN Gregary Signs stated that she isn't aware of this plan. Patient requested waiting until provider rounds before dressing change.   Paislee Szatkowski Lorita Officer, RN

## 2022-01-11 NOTE — Consult Note (Signed)
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 Patient reports he is indpendent at home using convex and belt, however staff have not been using required items despite ostomy care orders with written instructions and with Kellie Simmering # for items needed.  Today he has a small Eakin pouch on and it is working quite well, I am not surprised at the [employer] is standard to withstand fistula drainage  Stomal assessment/size: 1" round, pink, budded  Peristomal assessment: NA Treatment options for stomal/peristomal skin: NA Output high output, liquid stool  Ostomy pouching: 1pc. Eakin in place  Education provided: none; patient wishes to return home today and use previous regimen for care; agreed to have Eakin attached  to BSD for travel and to keep pouch from overfilling    [place], [place], [place]

## 2022-01-11 NOTE — Plan of Care (Signed)
  Problem: Education: Goal: Knowledge of General Education information will improve Description: Including pain rating scale, medication(s)/side effects and non-pharmacologic comfort measures 01/11/2022 1655 by Myriam Jacobson, RN Outcome: Completed/Met 01/11/2022 1655 by Myriam Jacobson, RN Outcome: Progressing   Problem: Health Behavior/Discharge Planning: Goal: Ability to manage health-related needs will improve 01/11/2022 1655 by Myriam Jacobson, RN Outcome: Completed/Met 01/11/2022 1655 by Myriam Jacobson, RN Outcome: Progressing   Problem: Clinical Measurements: Goal: Ability to maintain clinical measurements within normal limits will improve 01/11/2022 1655 by Myriam Jacobson, RN Outcome: Completed/Met 01/11/2022 1655 by Myriam Jacobson, RN Outcome: Progressing Goal: Will remain free from infection 01/11/2022 1655 by Myriam Jacobson, RN Outcome: Completed/Met 01/11/2022 1655 by Myriam Jacobson, RN Outcome: Progressing Goal: Diagnostic test results will improve 01/11/2022 1655 by Myriam Jacobson, RN Outcome: Completed/Met 01/11/2022 1655 by Myriam Jacobson, RN Outcome: Progressing Goal: Respiratory complications will improve 01/11/2022 1655 by Myriam Jacobson, RN Outcome: Completed/Met 01/11/2022 1655 by Myriam Jacobson, RN Outcome: Progressing Goal: Cardiovascular complication will be avoided 01/11/2022 1655 by Myriam Jacobson, RN Outcome: Completed/Met 01/11/2022 1655 by Myriam Jacobson, RN Outcome: Progressing   Problem: Activity: Goal: Risk for activity intolerance will decrease 01/11/2022 1655 by Myriam Jacobson, RN Outcome: Completed/Met 01/11/2022 1655 by Myriam Jacobson, RN Outcome: Progressing   Problem: Nutrition: Goal: Adequate nutrition will be maintained 01/11/2022 1655 by Myriam Jacobson, RN Outcome: Completed/Met 01/11/2022 1655 by Myriam Jacobson, RN Outcome: Progressing   Problem: Coping: Goal: Level of anxiety will decrease 01/11/2022 1655 by Myriam Jacobson, RN Outcome: Completed/Met 01/11/2022 1655 by  Myriam Jacobson, RN Outcome: Progressing   Problem: Elimination: Goal: Will not experience complications related to bowel motility 01/11/2022 1655 by Myriam Jacobson, RN Outcome: Completed/Met 01/11/2022 1655 by Myriam Jacobson, RN Outcome: Progressing Goal: Will not experience complications related to urinary retention 01/11/2022 1655 by Myriam Jacobson, RN Outcome: Completed/Met 01/11/2022 1655 by Myriam Jacobson, RN Outcome: Progressing   Problem: Pain Managment: Goal: General experience of comfort will improve 01/11/2022 1655 by Myriam Jacobson, RN Outcome: Completed/Met 01/11/2022 1655 by Myriam Jacobson, RN Outcome: Progressing   Problem: Safety: Goal: Ability to remain free from injury will improve 01/11/2022 1655 by Myriam Jacobson, RN Outcome: Completed/Met 01/11/2022 1655 by Myriam Jacobson, RN Outcome: Progressing   Problem: Skin Integrity: Goal: Risk for impaired skin integrity will decrease 01/11/2022 1655 by Myriam Jacobson, RN Outcome: Completed/Met 01/11/2022 1655 by Myriam Jacobson, RN Outcome: Progressing

## 2022-01-11 NOTE — TOC Initial Note (Signed)
Transition of Care [facility]) - Initial/Assessment Note    Patient Details  Name: Curtis [name] MRN: [id number] Date of Birth: 1956-02-07  Transition [facility] CM/SW Contact:    Curtis Mayo, RN Phone Number: 01/11/2022, 2:16 PM  Clinical Narrative:                 Patient is for dc today, he lives with son.  He states his daughter will transport him home.  He states that he does not want to be set up with outpatient physical therapy at this time.  He has an follow up apt with [facility] on 6/21 at 2:30, he states he will go to the apt.  NCM sent him a good RX coupon, and also informed him that he can get the good RX app on his phone as well.    Expected Discharge Plan: Home/Self Care Barriers to Discharge: No Barriers Identified   Patient Goals and CMS Choice Patient states their goals for this hospitalization and ongoing recovery are:: return home   Choice offered to / list presented to : NA  Expected Discharge Plan and Services Expected Discharge Plan: Home/Self Care In-house Referral: NA Discharge Planning Services: CM Consult Post Acute Care Choice: NA   Expected Discharge Date: 01/11/22                 DME Agency: NA                  Prior Living Arrangements/Services   Lives with:: Adult Children (Son) Patient language and need for interpreter reviewed:: Yes Do you feel safe going back to the place where you live?: Yes      Need for Family Participation in Patient Care: Yes (Comment) Care giver support system in place?: Yes (comment)   Criminal Activity/Legal Involvement Pertinent to Current Situation/Hospitalization: No - Comment as needed  Activities of Daily Living      Permission Sought/Granted                  Emotional Assessment   Attitude/Demeanor/Rapport: Engaged Affect (typically observed): Appropriate Orientation: : Oriented to Self, Oriented to Place, Oriented to  Time, Oriented to Situation Alcohol / Substance Use: Not  Applicable Psych Involvement: No (comment)  Admission diagnosis:  Septic shock ([place]) [A41.9, R65.21] Patient Active Problem List   Diagnosis Date Noted   E coli bacteremia 01/08/2022   Thrombocytopenia ([place]) 01/08/2022   Septic shock ([place]) 01/03/2022   Neutropenia ([place]) 06/10/2021   Hypokalemia 06/10/2021   Port-A-Cath in place 04/02/2021   Rectal cancer ([place]) 02/26/2021   Colonic mass    Bowel perforation ([place]) 01/16/2021   Intra-abdominal abscess ([place]) 01/16/2021   Elevated blood pressure reading 01/16/2021   Leukocytosis 01/16/2021   Hepatic steatosis 01/16/2021   Bladder wall thickening 01/16/2021   Microscopic hematuria 01/16/2021   PCP:  Pcp, No Pharmacy:   [facility] [address], [place] - [address] [place] [place] [address] Phone: [phone] Fax: [place] [address] Phone: [phone] Fax: [phone]     Social Determinants of Health (SDOH) Interventions    Readmission Risk Interventions    01/11/2022    2:14 PM  Readmission Risk Prevention Plan  Transportation Screening Complete  PCP or Specialist Appt within 3-5 Days Complete  HRI or [place] Complete  Social Work Consult for [place] Planning/Counseling Complete  Palliative Care Screening Not Applicable  Medication Review (RN  Care Manager) Complete

## 2022-01-11 NOTE — Discharge Summary (Addendum)
Physician Discharge Summary  Curtis [name] WYO:[id number] DOB: 1956/01/02 DOA: 01/03/2022  PCP: Pcp, No  Admit date: 01/03/2022 Discharge date: 01/11/2022  Admitted From:Home Disposition: Home/patient declined SNF  Recommendations for Outpatient Follow-up:  Follow up with PCP, appointment scheduled for 01/13/2022 Please obtain CMP/CBC during next visit Please follow-up with urology as an outpatient Dr. Elmyra Ricks office will call you with an appointment   Discharge Condition:Stable CODE STATUS:FULL Diet recommendation: Regular     Brief/Interim Summary:  [name] is a 66 year old gentleman with a history of rectosigmoid cancer status post resection who completed chemotherapy in December 2022 who presents with 2 days of chills and vomiting.  He did not have a fever at home.  He denies abdominal pain, change in stool/ostomy output, cough, sputum production, significant upper respiratory symptoms, wounds, new rashes.  He has chronic skin breakdown that is unchanged around his ostomy site.  He has a history of dental caries that has been more significant since chemotherapy.  He has not seen a dentist recently.  No recent dental changes. Per his son he has been breathing heavily and has been drifting in and out of sleep.  Upon arrival to the ED he was febrile to 101.7.  He was hypotensive requiring initiation of norepinephrine.  CVC placed in the ED. patient admitted to ICU   Significant events 6/11 admission-started on nor epi, cefepime, Vanco in the ED. Underwent right ureteral stent placement.  Surgery consulted for fluid collection in the presacral area.  Intubated and on pressors 6/12-blood and urine cultures growing E. Coli 6/13- off pressors. On PSV weans, extubated 6/13 UE duplex neg for DVT. Echo EF 45-50%       Septic shock secondary to E. coli bacteremia and urosepsis.  With infected right ureteral stone present on admission  - s/p ureteral stent placement by Dr. Milford Cage 6/11 -  Appreciate input from surgery who feels that the pre sacral fluid collection is likely not a big contributor. -White blood cell count and procalcitonin is improving,  -To follow with urology as an outpatient after discharge.  discussed with Dr. Milford Cage, he will arrange for outpatient follow-up for definitive treatment of his atrial stone. -Patient was treated with IV Rocephin, during hospital stay, will need another 5 days of oral Cipro on discharge for total of 15 days treatment, ID input greatly appreciated   Acute resp failure - resolved, s/p extubation 6/13. Resolved, currently on room air   AKI -Due to sepsis, hypotension and kidney stones - improving, creatinine peak at 3.19, it is 1.1 on discharge  Hypokalemia. - Repleting, will discharge on supplements   History of rectosigmoid cancer with ostomy bag - Routine ostomy care.  Patient to follow-up with [facility] as an outpatient regarding reversal of his ostomy bag -Patient with fluid collection, general surgery input greatly appreciated, at this point no need for sampling given septic work-up related to bacteremia and infected kidney stones     Thrombocytopenia, elevated INR due to sepsis. -  No in DIC as fibrinogen is high. - Elevated d dimer is concerning but suspicion for PE is low and echo reassuring. LE duplex also negative. 4T score is low, SRA and HIT within normal limit, no indication for HIT -Lately it has normalized at time of discharge at 158 K.   Elevated LFTs - improved. Liver US negative. Likely from shock liver due to septic shock Improving Recheck as an outpatient   Hypokalemia/hypophosphatemia -Repleted   Left toes cyanosis -Dopplers negative for DVT, Patient  with good peripheral pulses, ABIs with no significant vascular disease, vascular surgery input appreciated.    -Follow-up with vascular surgery if no improvement as an outpatient   Herpes Labialis -Treated with Valtrex, continue with acyclovir  ointment.        Discharge Diagnoses:  Principal Problem:   Septic shock ([place]) Active Problems:   Rectal cancer ([place])   E coli bacteremia   Thrombocytopenia [facility])    Discharge Instructions  Discharge Instructions     Diet - low sodium heart healthy   Complete by: As directed    Discharge instructions   Complete by: As directed    Follow with Primary MD Pcp,  Get CBC, CMP,  checked  by Primary MD next visit.    Activity: As tolerated with Full fall precautions use walker/cane & assistance as needed   Disposition Home    Diet: Regular diet   On your next visit with your primary care physician please Get Medicines reviewed and adjusted.   Please request your Prim.MD to go over all Hospital Tests and Procedure/Radiological results at the follow up, please get all Hospital records sent to your Prim MD by signing hospital release before you go home.   If you experience worsening of your admission symptoms, develop shortness of breath, life threatening emergency, suicidal or homicidal thoughts you must seek medical attention immediately by calling 911 or calling your MD immediately  if symptoms less severe.  You Must read complete instructions/literature along with all the possible adverse reactions/side effects for all the Medicines you take and that have been prescribed to you. Take any new Medicines after you have completely understood and accpet all the possible adverse reactions/side effects.   Do not drive, operating heavy machinery, perform activities at heights, swimming or participation in water activities or provide baby sitting services if your were admitted for syncope or siezures until you have seen by Primary MD or a Neurologist and advised to do so again.  Do not drive when taking Pain medications.    Do not take more than prescribed Pain, Sleep and Anxiety Medications  Special Instructions: If you have smoked or chewed Tobacco  in the last 2 yrs please  stop smoking, stop any regular Alcohol  and or any Recreational drug use.  Wear Seat belts while driving.   Please note  You were cared for by a hospitalist during your hospital stay. If you have any questions about your discharge medications or the care you received while you were in the hospital after you are discharged, you can call the unit and asked to speak with the hospitalist on call if the hospitalist that took care of you is not available. Once you are discharged, your primary care physician will handle any further medical issues. Please note that NO REFILLS for any discharge medications will be authorized once you are discharged, as it is imperative that you return to your primary care physician (or establish a relationship with a primary care physician if you do not have one) for your aftercare needs so that they can reassess your need for medications and monitor your lab values.   Increase activity slowly   Complete by: As directed    No wound care   Complete by: As directed       Allergies as of 01/11/2022       Reactions   Cefazolin Hives   Tolerated cefepime/ceftriaxone   Leucovorin Itching, Rash   Pt. had a reaction during infusion with Oxaliplatin.  Complained of itching "all over". Red, raised rash noted to anterior chest, upper back, and abdominal area. See progress note 06/22/21 Medicated pt. with Benadryl 50 mg IV then Solumedrol '125mg'$  IV. Waited 20 minutes and chemotherapy restarted   Oxaliplatin Itching, Rash   Pt. had a reaction of itching all over and red, raised rash noted to anterior upper chest, upper back, and abdominal area. See progress note 06/22/21 Medicated pt. with Benadryl 50 mg IV then Solumedrol '125mg'$  IV. Waited 20 minutes and chemotherapy restarted        Medication List     STOP taking these medications    prochlorperazine 10 MG tablet Commonly known as: COMPAZINE       TAKE these medications    acetaminophen 500 MG tablet Commonly  known as: TYLENOL Take 1,000 mg by mouth every 8 (eight) hours as needed for headache or moderate pain.   ciprofloxacin 500 MG tablet Commonly known as: CIPRO Take 1 tablet (500 mg total) by mouth 2 (two) times daily for 5 days.   lidocaine-prilocaine cream Commonly known as: EMLA Apply 1 application topically as needed. What changed: reasons to take this   loperamide 2 MG capsule Commonly known as: IMODIUM Take 2 mg by mouth as needed for diarrhea or loose stools. Up to 6 /day   ondansetron 8 MG tablet Commonly known as: ZOFRAN Take 1 tablet (8 mg total) by mouth every 8 (eight) hours as needed.   potassium chloride SA 20 MEQ tablet Commonly known as: Klor-Con M20 Please take 1 tablet twice daily x5 days then 1 tablet oral daily. What changed:  how much to take how to take this when to take this additional instructions        Follow-up Information     [facility] Follow up on 01/13/2022.   Why: 2:30 eo hoapital follow up Contact information: [address] [address] [phone]        Remi Haggard, MD Follow up.   Specialty: Urology Why: Office will call you with an appointment Contact information: [address] [place] [address] [phone]                Allergies  Allergen Reactions   Cefazolin Hives    Tolerated cefepime/ceftriaxone   Leucovorin Itching and Rash    Pt. had a reaction during infusion with Oxaliplatin. Complained of itching "all over". Red, raised rash noted to anterior chest, upper back, and abdominal area. See progress note 06/22/21  Medicated pt. with Benadryl 50 mg IV then Solumedrol '125mg'$  IV. Waited 20 minutes and chemotherapy restarted   Oxaliplatin Itching and Rash    Pt. had a reaction of itching all over and red, raised rash noted to anterior upper chest, upper back, and abdominal area. See progress note 06/22/21 Medicated pt. with  Benadryl 50 mg IV then Solumedrol '125mg'$  IV. Waited 20 minutes and chemotherapy restarted    Consultations: Urology  PCCM Vascular ID   Procedures/Studies: VAS [place] ABI WITH/WO TBI  Result Date: 01/07/2022  LOWER EXTREMITY DOPPLER STUDY Patient Name:  Curtis [name]  Date of Exam:   01/07/2022 Medical Rec #: [id number]     Accession #:    [id number] Date of Birth: Dec 11, 1955     Patient Gender: M Patient Age:   16 years Exam Location:  [facility] Procedure:      VAS [place] ABI WITH/WO TBI Referring Phys: Ruba Outen --------------------------------------------------------------------------------  Indications: Peripheral artery disease, and Pain in toes, bilateral. High Risk Factors: Current smoker. Other Factors: Septic shock.  Comparison Study: No previous exam noted. Performing Technologist: Bobetta Lime BS, RVT  Examination Guidelines: A complete evaluation includes at minimum, Doppler waveform signals and systolic blood pressure reading at the level of bilateral brachial, anterior tibial, and posterior tibial arteries, when vessel segments are accessible. Bilateral testing is considered an integral part of a complete examination. Photoelectric Plethysmograph (PPG) waveforms and toe systolic pressure readings are included as required and additional duplex testing as needed. Limited examinations for reoccurring indications may be performed as noted.  ABI Findings: +---------+------------------+-----+---------+---------+ Right    Rt Pressure (mmHg)IndexWaveform Comment   +---------+------------------+-----+---------+---------+ Brachial 157                    triphasic          +---------+------------------+-----+---------+---------+ PTA      183               1.12 triphasicHyperemic +---------+------------------+-----+---------+---------+ DP       204               1.25 triphasicHyperemic +---------+------------------+-----+---------+---------+ Great Toe135                0.83                    +---------+------------------+-----+---------+---------+ +---------+------------------+-----+---------+---------+ Left     Lt Pressure (mmHg)IndexWaveform Comment   +---------+------------------+-----+---------+---------+ Brachial 163                    triphasic          +---------+------------------+-----+---------+---------+ PTA      205               1.26 triphasicHyperemic +---------+------------------+-----+---------+---------+ DP       196               1.20 triphasicHyperemic +---------+------------------+-----+---------+---------+ Esau Grew                0.51                    +---------+------------------+-----+---------+---------+  Summary: Right: Resting right ankle-brachial index is within normal range. No evidence of significant right lower extremity arterial disease. The right toe-brachial index is normal. Left: Resting left ankle-brachial index is within normal range. No evidence of significant left lower extremity arterial disease. The left toe-brachial index is abnormal. *See table(s) above for measurements and observations.  Electronically signed by Orlie Pollen on 01/07/2022 at 5:35:34 PM.    Final    DG CHEST PORT 1 VIEW  Result Date: 01/06/2022 CLINICAL DATA:  Fever, chills, hypertension EXAM: PORTABLE CHEST 1 VIEW COMPARISON:  None Available. FINDINGS: Right chest port catheter tip overlies the mid superior vena cava. Unchanged cardiomediastinal silhouette. There are bibasilar airspace opacities, left greater than right. Suspected trace pleural effusions. There are mild interstitial opacities. Upper lung predominant emphysema. No pneumothorax. No acute osseous abnormality. IMPRESSION: Bibasilar airspace opacities, left greater than right, could be atelectasis or pneumonia. Interstitial edema and trace pleural effusions. Electronically Signed   By: Maurine Simmering M.D.   On: 01/06/2022 08:12   DG Abd Portable 1V  Result Date:  01/06/2022 CLINICAL DATA:  Fever, chills, vomiting, hypertension EXAM: PORTABLE ABDOMEN - 1 VIEW COMPARISON:  Radiograph 01/03/2022 FINDINGS: Nasogastric tube has been removed. There is a right-sided nephroureteral stent in place with unchanged appearance. The bowel gas pattern is nonobstructive. IMPRESSION: No evidence  of bowel obstruction. Electronically Signed   By: Maurine Simmering M.D.   On: 01/06/2022 08:10   US LIVER DOPPLER  Result Date: 01/06/2022 CLINICAL DATA:  Elevated LFTs. EXAM: DUPLEX ULTRASOUND OF LIVER TECHNIQUE: Color and duplex Doppler ultrasound was performed to evaluate the hepatic in-flow and out-flow vessels. COMPARISON:  Limited abdominal ultrasound 01/05/2022 FINDINGS: Liver: Limited evaluation of the liver parenchyma. Please refer to the right upper quadrant ultrasound from the same date. Main Portal Vein size: 1.2 cm Portal Vein Velocities Main Prox:  12.6 cm/sec Main Mid: 13.1 cm/sec Main Dist:  12.8 cm/sec Right: 12.8 cm/sec Left: 15.9 cm/sec Hepatic Vein Velocities Right:  27.8 cm/sec Middle:  36.2 cm/sec Left:  18.0 cm/sec IVC: Patent with color Doppler flow. Hepatic Artery Velocity:  107 cm/sec Splenic Vein Velocity:  14.5 cm/sec Spleen: 11.8 cm x 3.8 cm x 4.7 cm with a total volume of 110 cm^3 (411 cm^3 is upper limit normal) Portal Vein Occlusion/Thrombus: No Splenic Vein Occlusion/Thrombus: No Ascites: Present Varices: None Normal hepatopetal flow in the portal veins. Normal hepatofugal flow in the hepatic veins. Ascites in the left upper quadrant. Evidence for bilateral pleural effusions. IMPRESSION: 1. Portal venous system is patent with normal direction of flow. 2. Bilateral pleural effusions.  Small amount of ascites. Electronically Signed   By: Markus Daft M.D.   On: 01/06/2022 08:06   US Abdomen Limited RUQ (LIVER/GB)  Result Date: 01/05/2022 CLINICAL DATA:  Elevated LFT EXAM: ULTRASOUND ABDOMEN LIMITED RIGHT UPPER QUADRANT COMPARISON:  CT 01/03/2022, MRI 02/16/2021  FINDINGS: Gallbladder: Negative for shadowing stones. Slight increased gallbladder wall thickness at 4.9 mm. Negative sonographic Murphy. Common bile duct: Diameter: 2.1 mm Liver: Echogenic nodule at the right hepatic dome, likely corresponds to MRI demonstrated hemangioma. Hepatic echogenicity grossly within normal limits. There is limited visibility due to rib shadowing. Portal vein is patent on color Doppler imaging with normal direction of blood flow towards the liver. Other: Incidental note made of right pleural effusion IMPRESSION: 1. Nonspecific thickened gallbladder wall without stone disease. Findings are nonspecific and could be secondary to liver disease, edema forming states, or cholecystitis. If further imaging is required, correlation with nuclear medicine hepatobiliary imaging could be obtained 2. Probable small hemangioma at the hepatic dome. Slightly limited assessment of liver due to rib shadowing and patient physical condition 3. Right pleural effusion Electronically Signed   By: Donavan Foil M.D.   On: 01/05/2022 21:19   ECHOCARDIOGRAM COMPLETE  Result Date: 01/05/2022    ECHOCARDIOGRAM REPORT   Patient Name:   Curtis [name] Date of Exam: 01/05/2022 Medical Rec #:  [id number]    Height:       70.0 in Accession #:    [id number]   Weight:       218.5 lb Date of Birth:  1955-08-28    BSA:          2.167 m Patient Age:    66 years     BP:           103/52 mmHg Patient Gender: M            HR:           58 bpm. Exam Location:  Inpatient Procedure: 2D Echo, Cardiac Doppler, Color Doppler and Intracardiac            Opacification Agent Indications:     Acute Respiratory failure  History:         Patient has no prior history of Echocardiogram examinations.  Sonographer:     Rosann Auerbach, FE, PE Referring Phys:  [id number] [facility] Diagnosing Phys: Oswaldo Milian MD IMPRESSIONS  1. Left ventricular ejection fraction, by estimation, is 45 to 50%. The left ventricle has mildly decreased  function. The left ventricle demonstrates regional wall motion abnormalities (see scoring diagram/findings for description). Apical hypokinesis. There is mild left ventricular hypertrophy. Left ventricular diastolic parameters are indeterminate.  2. Right ventricular systolic function is mildly reduced. The right ventricular size is mildly enlarged.  3. Right atrial size was mildly dilated.  4. The mitral valve is normal in structure. No evidence of mitral valve regurgitation. No evidence of mitral stenosis.  5. The aortic valve is tricuspid. Aortic valve regurgitation is not visualized. Aortic valve sclerosis is present, with no evidence of aortic valve stenosis.  6. Aortic dilatation noted. There is mild dilatation of the aortic root, measuring 41 mm. FINDINGS  Left Ventricle: Left ventricular ejection fraction, by estimation, is 45 to 50%. The left ventricle has mildly decreased function. The left ventricle demonstrates regional wall motion abnormalities. The left ventricular internal cavity size was normal in size. There is mild left ventricular hypertrophy. Left ventricular diastolic parameters are indeterminate.  LV Wall Scoring: The apical septal segment, apical inferior segment, and apex are hypokinetic. The entire anterior wall, entire lateral wall, anterior septum, inferior wall, mid inferoseptal segment, and basal inferoseptal segment are normal. Right Ventricle: The right ventricular size is mildly enlarged. No increase in right ventricular wall thickness. Right ventricular systolic function is mildly reduced. The tricuspid regurgitant velocity is 1.85 m/s, and with an assumed right atrial pressure of 15 mmHg, the estimated right ventricular systolic pressure is 71.0 mmHg. Left Atrium: Left atrial size was normal in size. Right Atrium: Right atrial size was mildly dilated. Pericardium: There is no evidence of pericardial effusion. Mitral Valve: The mitral valve is normal in structure. No evidence of  mitral valve regurgitation. No evidence of mitral valve stenosis. Tricuspid Valve: The tricuspid valve is normal in structure. Tricuspid valve regurgitation is trivial. Aortic Valve: The aortic valve is tricuspid. Aortic valve regurgitation is not visualized. Aortic valve sclerosis is present, with no evidence of aortic valve stenosis. Pulmonic Valve: The pulmonic valve was not well visualized. Pulmonic valve regurgitation is not visualized. Aorta: Aortic dilatation noted. There is mild dilatation of the aortic root, measuring 41 mm. IAS/Shunts: The interatrial septum was not well visualized.  LEFT VENTRICLE PLAX 2D LVIDd:         4.90 cm   Diastology LVIDs:         3.70 cm   LV e' medial:    6.53 cm/s LV PW:         1.00 cm   LV E/e' medial:  12.1 LV IVS:        1.10 cm   LV e' lateral:   7.83 cm/s LVOT diam:     2.20 cm   LV E/e' lateral: 10.1 LV SV:         70 LV SV Index:   32 LVOT Area:     3.80 cm  RIGHT VENTRICLE RV S prime:     9.25 cm/s TAPSE (M-mode): 1.6 cm LEFT ATRIUM           Index        RIGHT ATRIUM           Index LA diam:      3.70 cm 1.71 cm/m   RA Area:     23.40  cm LA Vol (A4C): 59.7 ml 27.55 ml/m  RA Volume:   80.80 ml  37.28 ml/m  AORTIC VALVE LVOT Vmax:   86.80 cm/s LVOT Vmean:  65.700 cm/s LVOT VTI:    0.184 m  AORTA Ao Root diam: 4.05 cm Ao Asc diam:  3.70 cm MITRAL VALVE               TRICUSPID VALVE MV Area (PHT): 3.11 cm    TR Peak grad:   13.7 mmHg MV Decel Time: 244 msec    TR Vmax:        185.00 cm/s MV E velocity: 79.30 cm/s MV A velocity: 44.10 cm/s  SHUNTS MV E/A ratio:  1.80        Systemic VTI:  0.18 m                            Systemic Diam: 2.20 cm Oswaldo Milian MD Electronically signed by Oswaldo Milian MD Signature Date/Time: 01/05/2022/4:17:20 PM    Final (Updated)    VAS [place] UPPER EXTREMITY VENOUS DUPLEX  Result Date: 01/05/2022 UPPER VENOUS STUDY  Patient Name:  Curtis [name]  Date of Exam:   01/05/2022 Medical Rec #: [id number]     Accession #:     [id number] Date of Birth: Feb 26, 1956     Patient Gender: M Patient Age:   13 years Exam [facility]  [facility] Procedure:      VAS [place] UPPER EXTREMITY VENOUS DUPLEX Referring Phys: Marshell Garfinkel --------------------------------------------------------------------------------  Indications: Swelling Risk Factors: None identified. Limitations: Poor ultrasound/tissue interface, line, bandages and patient immobility. Comparison Study: No prior studies. Performing Technologist: Oliver Hum RVT  Examination Guidelines: A complete evaluation includes B-mode imaging, spectral Doppler, color Doppler, and power Doppler as needed of all accessible portions of each vessel. Bilateral testing is considered an integral part of a complete examination. Limited examinations for reoccurring indications may be performed as noted.  Right Findings: +----------+------------+---------+-----------+----------+-------+ RIGHT     CompressiblePhasicitySpontaneousPropertiesSummary +----------+------------+---------+-----------+----------+-------+ IJV           Full       Yes       Yes                      +----------+------------+---------+-----------+----------+-------+ Subclavian    Full       Yes       Yes                      +----------+------------+---------+-----------+----------+-------+ Axillary      Full       Yes       Yes                      +----------+------------+---------+-----------+----------+-------+ Brachial      Full       Yes       Yes                      +----------+------------+---------+-----------+----------+-------+ Radial        Full                                          +----------+------------+---------+-----------+----------+-------+ Ulnar         Full                                          +----------+------------+---------+-----------+----------+-------+  Cephalic      Full                                           +----------+------------+---------+-----------+----------+-------+ Basilic       Full                                          +----------+------------+---------+-----------+----------+-------+  Left Findings: +----------+------------+---------+-----------+----------+-------+ LEFT      CompressiblePhasicitySpontaneousPropertiesSummary +----------+------------+---------+-----------+----------+-------+ IJV           Full       Yes       Yes                      +----------+------------+---------+-----------+----------+-------+ Subclavian    Full       Yes       Yes                      +----------+------------+---------+-----------+----------+-------+ Axillary      Full       Yes       Yes                      +----------+------------+---------+-----------+----------+-------+ Brachial      Full       Yes       Yes                      +----------+------------+---------+-----------+----------+-------+ Radial        Full                                          +----------+------------+---------+-----------+----------+-------+ Ulnar         Full                                          +----------+------------+---------+-----------+----------+-------+ Cephalic      Full                                          +----------+------------+---------+-----------+----------+-------+ Basilic       Full                                          +----------+------------+---------+-----------+----------+-------+  Summary:  Right: No evidence of deep vein thrombosis in the upper extremity. No evidence of superficial vein thrombosis in the upper extremity.  Left: No evidence of deep vein thrombosis in the upper extremity. No evidence of superficial vein thrombosis in the upper extremity.  *See table(s) above for measurements and observations.  Diagnosing physician: Deitra Mayo MD Electronically signed by Deitra Mayo MD on 01/05/2022 at 3:17:46 PM.     Final    VAS [place] LOWER EXTREMITY VENOUS (DVT)  Result Date: 01/05/2022  Lower Venous DVT Study Patient Name:  Curtis [name]  Date of Exam:   01/05/2022 Medical Rec #: [id number]  Accession #:    [id number] Date of Birth: 01-16-1956     Patient Gender: M Patient Age:   49 years Exam Location:  [facility] Procedure:      VAS [place] LOWER EXTREMITY VENOUS (DVT) Referring Phys: Marshell Garfinkel --------------------------------------------------------------------------------  Indications: Swelling.  Risk Factors: None identified. Limitations: Poor ultrasound/tissue interface and patient positioning, patient immobility. Comparison Study: No prior studies. Performing Technologist: Oliver Hum RVT  Examination Guidelines: A complete evaluation includes B-mode imaging, spectral Doppler, color Doppler, and power Doppler as needed of all accessible portions of each vessel. Bilateral testing is considered an integral part of a complete examination. Limited examinations for reoccurring indications may be performed as noted. The reflux portion of the exam is performed with the patient in reverse Trendelenburg.  +---------+---------------+---------+-----------+----------+--------------+ RIGHT    CompressibilityPhasicitySpontaneityPropertiesThrombus Aging +---------+---------------+---------+-----------+----------+--------------+ CFV      Full           Yes      Yes                                 +---------+---------------+---------+-----------+----------+--------------+ SFJ      Full                                                        +---------+---------------+---------+-----------+----------+--------------+ FV Prox  Full                                                        +---------+---------------+---------+-----------+----------+--------------+ FV Mid   Full                                                         +---------+---------------+---------+-----------+----------+--------------+ FV Distal               Yes      Yes                                 +---------+---------------+---------+-----------+----------+--------------+ PFV      Full                                                        +---------+---------------+---------+-----------+----------+--------------+ POP      Full           Yes      Yes                                 +---------+---------------+---------+-----------+----------+--------------+ PTV      Full                                                        +---------+---------------+---------+-----------+----------+--------------+  PERO     Full                                                        +---------+---------------+---------+-----------+----------+--------------+   +---------+---------------+---------+-----------+----------+--------------+ LEFT     CompressibilityPhasicitySpontaneityPropertiesThrombus Aging +---------+---------------+---------+-----------+----------+--------------+ CFV      Full           Yes      Yes                                 +---------+---------------+---------+-----------+----------+--------------+ SFJ      Full                                                        +---------+---------------+---------+-----------+----------+--------------+ FV Prox  Full                                                        +---------+---------------+---------+-----------+----------+--------------+ FV Mid   Full                                                        +---------+---------------+---------+-----------+----------+--------------+ FV Distal               Yes      Yes                                 +---------+---------------+---------+-----------+----------+--------------+ PFV      Full                                                         +---------+---------------+---------+-----------+----------+--------------+ POP      Full           Yes      Yes                                 +---------+---------------+---------+-----------+----------+--------------+ PTV      Full                                                        +---------+---------------+---------+-----------+----------+--------------+ PERO     Full                                                        +---------+---------------+---------+-----------+----------+--------------+  Summary: RIGHT: - There is no evidence of deep vein thrombosis in the lower extremity. However, portions of this examination were limited- see technologist comments above.  - No cystic structure found in the popliteal fossa.  LEFT: - There is no evidence of deep vein thrombosis in the lower extremity. However, portions of this examination were limited- see technologist comments above.  - No cystic structure found in the popliteal fossa.  *See table(s) above for measurements and observations. Electronically signed by Deitra Mayo MD on 01/05/2022 at 3:11:54 PM.    Final    DG Retrograde Pyelogram  Result Date: 01/03/2022 CLINICAL DATA:  Cystoscopy with stent placement EXAM: RETROGRADE PYELOGRAM COMPARISON:  KUB and CT CAP, same day. FLUOROSCOPY: Exposure Index (as provided by the fluoroscopic device): 8.4 mGy Kerma FINDINGS: Limited frontal planar images of the RIGHT lower quadrant obtained C-arm. Images demonstrating ureteroscopic instrumentation, retrograde pyelogram and ureteral stent placement. Distal catheter tip within the RIGHT urinary pelvis. No ureteral dilation. IMPRESSION: Fluoroscopic imaging for ureteroscopy and RIGHT ureteral stent placement. For complete description of intra procedural findings, please see performing service dictation. Electronically Signed   By: Michaelle Birks M.D.   On: 01/03/2022 18:25   DG Abd Portable 1V  Result Date: 01/03/2022 CLINICAL DATA:   NG tube placement. EXAM: PORTABLE ABDOMEN - 1 VIEW COMPARISON:  CT, 01/03/2022 FINDINGS: Nasogastric tube passes below the diaphragm, tip in the proximal to mid stomach, well positioned. IMPRESSION: Well-positioned nasal/orogastric tube. Electronically Signed   By: Lajean Manes M.D.   On: 01/03/2022 15:59   CT CHEST ABDOMEN PELVIS WO CONTRAST  Result Date: 01/03/2022 CLINICAL DATA:  Sepsis.  History of rectosigmoid cancer. EXAM: CT CHEST, ABDOMEN AND PELVIS WITHOUT CONTRAST TECHNIQUE: Multidetector CT imaging of the chest, abdomen and pelvis was performed following the standard protocol without IV contrast. RADIATION DOSE REDUCTION: This exam was performed according to the departmental dose-optimization program which includes automated exposure control, adjustment of the mA and/or kV according to patient size and/or use of iterative reconstruction technique. COMPARISON:  CT chest 02/19/2021, MR abdomen 02/16/2021 and CT AP 01/19/2021 FINDINGS: CT CHEST FINDINGS Cardiovascular: Heart size appears normal. No pericardial effusion. Aortic atherosclerosis and coronary artery calcifications. Mediastinum/Nodes: Thyroid gland, trachea, and esophagus demonstrate no significant findings. No enlarged axillary, supraclavicular, or mediastinal lymph nodes. Lungs/Pleura: Centrilobular and paraseptal emphysema. Small bilateral pleural effusions. Mild bilateral lower lung zone predominant interlobular septal thickening concerning for interstitial edema. There is no airspace consolidation, atelectasis or pneumothorax. Tiny nodule within the left apex measures 4 mm, image 33/7. Not confidently identified on previous exam. Musculoskeletal: No chest wall mass or suspicious bone lesions identified. CT ABDOMEN PELVIS FINDINGS Hepatobiliary: Within the limitations of unenhanced technique there is no focal liver abnormality. Gallbladder appears unremarkable. No bile duct dilatation. Pancreas: Unremarkable. No pancreatic ductal  dilatation or surrounding inflammatory changes. Spleen: Normal in size without focal abnormality. Adrenals/Urinary Tract: Normal appearance of the adrenal glands. Normal left kidney. Small stone within inferior pole of the right kidney measures 4 mm, image 202/6. There is right pelvocaliectasis identified, similar to 01/19/2021. There is a stone within the distal right ureter measuring 3 mm, image 69/11. mild circumferential wall thickening involving the urinary bladder with surrounding soft tissue stranding. Stomach/Bowel: Stomach is unremarkable. 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 No pathologic dilatation of the large or small bowel loops to suggest bowel obstruction. Anastomotic suture line is identified within the lower rectum, image 123/4. Vascular/Lymphatic: Aortic atherosclerosis. No signs of abdominopelvic adenopathy. Reproductive:  Prostate is unremarkable. Other: Soft tissue stranding and free fluid is noted extending along the right paracolic gutter towards the inferior margin of right lobe of liver, image 86/4. Within the presacral region there is a nonspecific fluid collection measuring 8.1 by 2.5 x 4.8 cm, image 118/4 and image 27/12. This is incompletely characterized without IV contrast. Within the anterior left pelvis there is a fat density area with internal soft tissue stranding with a thin peripheral soft tissue rim which likely represents an area of fat necrosis, image 111/4. Musculoskeletal: No acute osseous findings. No suspicious bone lesions noted. IMPRESSION: 1. Mild interstitial edema with small bilateral pleural effusions. 2. Right-sided pelvocaliectasis and right hydroureter secondary to 3 mm distal right ureteral stone. 3. Nonspecific fluid collection within the presacral region. This is incompletely characterized without IV contrast. Although this could represent a benign postoperative fluid collection, abscess is not excluded. 4. Free fluid and fat stranding is noted  extending along the right pericolic gutter. 5. Tiny nodule within the left apex is new from previous exam measuring 4 mm. 6. Aortic Atherosclerosis (ICD10-I70.0) and Emphysema (ICD10-J43.9). Electronically Signed   By: Kerby Moors M.D.   On: 01/03/2022 10:31   CT MAXILLOFACIAL WO CONTRAST  Result Date: 01/03/2022 CLINICAL DATA:  Sepsis with unknown source, severe dental caries, evaluate for dental abscess. EXAM: CT MAXILLOFACIAL WITHOUT CONTRAST TECHNIQUE: Multidetector CT imaging of the maxillofacial structures was performed. Multiplanar CT image reconstructions were also generated. RADIATION DOSE REDUCTION: This exam was performed according to the departmental dose-optimization program which includes automated exposure control, adjustment of the mA and/or kV according to patient size and/or use of iterative reconstruction technique. COMPARISON:  None Available. FINDINGS: Osseous: Dental caries with periapical lucency involving left central incisor and remaining premolar in the left mandible. No evidence of adjacent cellulitis. No gross abscess. Orbits: Negative Sinuses: No active sinusitis. Soft tissues: No evidence of acute inflammation. Limited intracranial: Negative IMPRESSION: 1. Dental caries and left mandibular periapical erosions. No evidence of odontogenic soft tissue infection or abscess. 2. Negative for sinusitis. Electronically Signed   By: Jorje Guild M.D.   On: 01/03/2022 05:44   DG Chest Port 1 View  Result Date: 01/03/2022 CLINICAL DATA:  Sepsis EXAM: PORTABLE CHEST 1 VIEW COMPARISON:  None Available. FINDINGS: The heart size and mediastinal contours are within normal limits. Both lungs are clear. The visualized skeletal structures are unremarkable. Right chest wall Port-A-Cath tip in the lower SVC. IMPRESSION: No active disease. Electronically Signed   By: Ulyses Jarred M.D.   On: 01/03/2022 02:46      Subjective: No nausea, no vomiting, intake, no urinary retention since Foley  catheter discontinued 48 hours ago Discharge Exam: Vitals:   01/11/22 0600 01/11/22 0826  BP: (!) 148/58 139/63  Pulse: 76 86  Resp: 20 20  Temp: 98.3 F (36.8 C) 97.9 F (36.6 C)  SpO2: 94% 93%   Vitals:   01/11/22 0126 01/11/22 0443 01/11/22 0600 01/11/22 0826  BP: (!) 146/51  (!) 148/58 139/63  Pulse: 80  76 86  Resp: '20  20 20  '$ Temp: 97.9 F (36.6 C)  98.3 F (36.8 C) 97.9 F (36.6 C)  TempSrc:    Oral  SpO2: 94%  94% 93%  Weight:  88.3 kg    Height:        General: Pt is alert, awake, not in acute distress, obese ulcers are improving Cardiovascular: RRR, S1/S2 +, no rubs, no gallops Respiratory: CTA bilaterally, no wheezing, no rhonchi  Abdominal: Soft, NT, ND, bowel sounds +, ostomy present left lower quadrant area Extremities: no edema, left foot toe tips cyanotic    The results of significant diagnostics from this hospitalization (including imaging, microbiology, ancillary and laboratory) are listed below for reference.     Microbiology: Recent Results (from the past 240 hour(s))  Culture, blood (Routine x 2)     Status: Abnormal   Collection Time: 01/02/22  8:30 PM   Specimen: BLOOD RIGHT FOREARM  Result Value Ref Range Status   Specimen Description BLOOD RIGHT FOREARM  Final   Special Requests   Final    BOTTLES DRAWN AEROBIC AND ANAEROBIC Blood Culture results may not be optimal due to an inadequate volume of blood received in culture bottles   Culture  Setup Time   Final    GRAM NEGATIVE RODS IN BOTH AEROBIC AND ANAEROBIC BOTTLES CRITICAL VALUE NOTED.  VALUE IS CONSISTENT WITH PREVIOUSLY REPORTED AND CALLED VALUE. Performed at [facility], [place] [address] [place], [place] [address]    Culture ESCHERICHIA COLI (A)  Final   Report Status 01/05/2022 FINAL  Final  Culture, blood (Routine x 2)     Status: Abnormal   Collection Time: 01/03/22  2:35 AM   Specimen: BLOOD LEFT FOREARM  Result Value Ref Range Status   Specimen Description BLOOD LEFT  FOREARM  Final   Special Requests   Final    BOTTLES DRAWN AEROBIC AND ANAEROBIC Blood Culture adequate volume   Culture  Setup Time   Final    GRAM NEGATIVE RODS IN BOTH AEROBIC AND ANAEROBIC BOTTLES CRITICAL RESULT CALLED TO, READ BACK BY AND VERIFIED WITH: P. DANG PHARMD, AT 1811 01/03/22 D. VANHOOK Performed at [facility], [place] [address] [place], [place] [address]    Culture ESCHERICHIA COLI (A)  Final   Report Status 01/05/2022 FINAL  Final   Organism ID, Bacteria ESCHERICHIA COLI  Final      Susceptibility   Escherichia coli - MIC*    AMPICILLIN >=32 RESISTANT Resistant     CEFAZOLIN <=4 SENSITIVE Sensitive     CEFEPIME <=0.12 SENSITIVE Sensitive     CEFTAZIDIME <=1 SENSITIVE Sensitive     CEFTRIAXONE <=0.25 SENSITIVE Sensitive     CIPROFLOXACIN <=0.25 SENSITIVE Sensitive     GENTAMICIN <=1 SENSITIVE Sensitive     IMIPENEM <=0.25 SENSITIVE Sensitive     TRIMETH/SULFA >=320 RESISTANT Resistant     AMPICILLIN/SULBACTAM >=32 RESISTANT Resistant     PIP/TAZO >=128 RESISTANT Resistant     * ESCHERICHIA COLI  Blood Culture ID Panel (Reflexed)     Status: Abnormal   Collection Time: 01/03/22  2:35 AM  Result Value Ref Range Status   Enterococcus faecalis NOT DETECTED NOT DETECTED Final   Enterococcus Faecium NOT DETECTED NOT DETECTED Final   Listeria monocytogenes NOT DETECTED NOT DETECTED Final   Staphylococcus species NOT DETECTED NOT DETECTED Final   Staphylococcus aureus (BCID) NOT DETECTED NOT DETECTED Final   Staphylococcus epidermidis NOT DETECTED NOT DETECTED Final   Staphylococcus lugdunensis NOT DETECTED NOT DETECTED Final   Streptococcus species NOT DETECTED NOT DETECTED Final   Streptococcus agalactiae NOT DETECTED NOT DETECTED Final   Streptococcus pneumoniae NOT DETECTED NOT DETECTED Final   Streptococcus pyogenes NOT DETECTED NOT DETECTED Final   A.calcoaceticus-baumannii NOT DETECTED NOT DETECTED Final   Bacteroides fragilis NOT DETECTED NOT DETECTED  Final   Enterobacterales DETECTED (A) NOT DETECTED Final    Comment: Enterobacterales represent a large order of  gram negative bacteria, not a single organism. CRITICAL RESULT CALLED TO, READ BACK BY AND VERIFIED WITH: P. DANG PHARMD, AT 1811 01/03/22 D. VANHOOK    Enterobacter cloacae complex NOT DETECTED NOT DETECTED Final   Escherichia coli DETECTED (A) NOT DETECTED Final    Comment: CRITICAL RESULT CALLED TO, READ BACK BY AND VERIFIED WITH: P. DANG PHARMD, AT 1811 01/03/22 D. VANHOOK    Klebsiella aerogenes NOT DETECTED NOT DETECTED Final   Klebsiella oxytoca NOT DETECTED NOT DETECTED Final   Klebsiella pneumoniae NOT DETECTED NOT DETECTED Final   Proteus species NOT DETECTED NOT DETECTED Final   Salmonella species NOT DETECTED NOT DETECTED Final   Serratia marcescens NOT DETECTED NOT DETECTED Final   Haemophilus influenzae NOT DETECTED NOT DETECTED Final   Neisseria meningitidis NOT DETECTED NOT DETECTED Final   Pseudomonas aeruginosa NOT DETECTED NOT DETECTED Final   Stenotrophomonas maltophilia NOT DETECTED NOT DETECTED Final   Candida albicans NOT DETECTED NOT DETECTED Final   Candida auris NOT DETECTED NOT DETECTED Final   Candida glabrata NOT DETECTED NOT DETECTED Final   Candida krusei NOT DETECTED NOT DETECTED Final   Candida parapsilosis NOT DETECTED NOT DETECTED Final   Candida tropicalis NOT DETECTED NOT DETECTED Final   Cryptococcus neoformans/gattii NOT DETECTED NOT DETECTED Final   CTX-M ESBL NOT DETECTED NOT DETECTED Final   Carbapenem resistance IMP NOT DETECTED NOT DETECTED Final   Carbapenem resistance KPC NOT DETECTED NOT DETECTED Final   Carbapenem resistance NDM NOT DETECTED NOT DETECTED Final   Carbapenem resist OXA 48 LIKE NOT DETECTED NOT DETECTED Final   Carbapenem resistance VIM NOT DETECTED NOT DETECTED Final    Comment: Performed at [facility], [address] [place], [place] [address]  Resp Panel by RT-PCR (Flu A&B, Covid) Anterior Nasal Swab      Status: None   Collection Time: 01/03/22  2:40 AM   Specimen: Anterior Nasal Swab  Result Value Ref Range Status   SARS Coronavirus 2 by RT PCR NEGATIVE NEGATIVE Final    Comment: (NOTE) SARS-CoV-2 target nucleic acids are NOT DETECTED.  The SARS-CoV-2 RNA is generally detectable in upper respiratory specimens during the acute phase of infection. The lowest concentration of SARS-CoV-2 viral copies this assay can detect is 138 copies/mL. A negative result does not preclude SARS-Cov-2 infection and should not be used as the sole basis for treatment or other patient management decisions. A negative result may occur with  improper specimen collection/handling, submission of specimen other than nasopharyngeal swab, presence of viral mutation(s) within the areas targeted by this assay, and inadequate number of viral copies(<138 copies/mL). A negative result must be combined with clinical observations, patient history, and epidemiological information. The expected result is Negative.  Fact Sheet for Patients:  EntrepreneurPulse.com.au  Fact Sheet for Healthcare Providers:  IncredibleEmployment.be  This test is no t yet approved or cleared by the [place] FDA and  has been authorized for detection and/or diagnosis of SARS-CoV-2 by FDA under an Emergency Use Authorization (EUA). This EUA will remain  in effect (meaning this test can be used) for the duration of the COVID-19 declaration under Section 564(b)(1) of the Act, 21 U.S.C.section 360bbb-3(b)(1), unless the authorization is terminated  or revoked sooner.       Influenza A by PCR NEGATIVE NEGATIVE Final   Influenza B by PCR NEGATIVE NEGATIVE Final    Comment: (NOTE) The Xpert Xpress SARS-CoV-2/FLU/RSV plus assay is intended as an aid in the diagnosis of influenza from  Nasopharyngeal swab specimens and should not be used as a sole basis for treatment. Nasal washings and aspirates are  unacceptable for Xpert Xpress SARS-CoV-2/FLU/RSV testing.  Fact Sheet for Patients: EntrepreneurPulse.com.au  Fact Sheet for Healthcare Providers: IncredibleEmployment.be  This test is not yet approved or cleared by the [place] FDA and has been authorized for detection and/or diagnosis of SARS-CoV-2 by FDA under an Emergency Use Authorization (EUA). This EUA will remain in effect (meaning this test can be used) for the duration of the COVID-19 declaration under Section 564(b)(1) of the Act, 21 U.S.C. section 360bbb-3(b)(1), unless the authorization is terminated or revoked.  Performed at [facility], [place] [address] [place], [place] [address]   Urine Culture     Status: Abnormal   Collection Time: 01/03/22  3:56 AM   Specimen: Urine  Result Value Ref Range Status   Specimen Description IN/OUT CATH URINE  Final   Special Requests   Final    NONE Performed at [facility], [place] [address], [place], [place] [address]    Culture 40,000 COLONIES/mL ESCHERICHIA COLI (A)  Final   Report Status 01/05/2022 FINAL  Final   Organism ID, Bacteria ESCHERICHIA COLI (A)  Final      Susceptibility   Escherichia coli - MIC*    AMPICILLIN >=32 RESISTANT Resistant     CEFAZOLIN <=4 SENSITIVE Sensitive     CEFEPIME <=0.12 SENSITIVE Sensitive     CEFTRIAXONE <=0.25 SENSITIVE Sensitive     CIPROFLOXACIN <=0.25 SENSITIVE Sensitive     GENTAMICIN <=1 SENSITIVE Sensitive     IMIPENEM <=0.25 SENSITIVE Sensitive     NITROFURANTOIN <=16 SENSITIVE Sensitive     TRIMETH/SULFA >=320 RESISTANT Resistant     AMPICILLIN/SULBACTAM >=32 RESISTANT Resistant     PIP/TAZO 64 INTERMEDIATE Intermediate     * 40,000 COLONIES/mL ESCHERICHIA COLI  MRSA Next Gen by PCR, Nasal     Status: None   Collection Time: 01/03/22  5:37 AM   Specimen: Nasal Mucosa; Nasal Swab  Result Value Ref Range Status   MRSA by PCR Next Gen NOT DETECTED NOT DETECTED Final     Comment: (NOTE) The GeneXpert MRSA Assay (FDA approved for NASAL specimens only), is one component of a comprehensive MRSA colonization surveillance program. It is not intended to diagnose MRSA infection nor to guide or monitor treatment for MRSA infections. Test performance is not FDA approved in patients less than 33 years old. Performed at [facility], [place] [address], [place], [place] [address]      Labs: BNP (last 3 results) No results for input(s): "BNP" in the last 8760 hours. Basic Metabolic Panel: Recent Labs  Lab 01/04/22 1742 01/05/22 0339 01/05/22 1717 01/06/22 0345 01/07/22 0407 01/07/22 1411 01/08/22 0055 01/09/22 0340 01/10/22 0323 01/11/22 0339  NA  --  134*  --    < > 143 144 143 143 142 141  K  --  4.1  --    < > 2.9* 3.2* 3.6 3.0* 3.3* 3.3*  CL  --  104  --    < > 109 111 112* 107 108 108  CO2  --  16*  --    < > '22 23 22 23 23 22  '$ GLUCOSE  --  121*  --    < > 95 111* 138* 120* 125* 110*  BUN  --  56*  --    < > 51* 48* 43* 34* 27* 23  CREATININE  --  2.89*  --    < > 2.05* 1.88* 1.66* 1.40* 1.38* 1.16  CALCIUM  --  7.3*  --    < > 8.0* 8.0* 8.0* 8.1* 8.3* 8.1*  MG 2.4 2.2 2.2  --  2.1  --  1.8  --   --   --   PHOS 4.9* 4.5 3.1  --  2.3*  --  3.3 2.8 2.7 2.8   < > = values in this interval not displayed.   Liver Function Tests: Recent Labs  Lab 01/05/22 0339 01/06/22 0345 01/07/22 1411 01/10/22 0323  AST 1,360* 525* 296* 59*  ALT 1,020* 828* 390* 124*  ALKPHOS 65 167* 237* 224*  BILITOT 2.0* 3.2* 3.8* 2.7*  PROT 4.7* 5.1* 5.2* 5.6*  ALBUMIN 2.2* 2.3* 2.3* 2.4*   Recent Labs  Lab 01/06/22 0345  LIPASE 42   No results for input(s): "AMMONIA" in the last 168 hours. CBC: Recent Labs  Lab 01/07/22 1411 01/08/22 0055 01/09/22 0340 01/10/22 0323 01/11/22 0339  WBC 25.8* 23.4* 20.0* 18.4* 14.8*  NEUTROABS 23.4*  --   --   --   --   HGB 8.9* 9.3* 8.8* 8.8* 8.6*  HCT 27.0* 26.8* 26.4* 27.2* 25.8*  MCV 87.7 85.6 86.8 87.7 86.9  PLT  69* 71* 90* 127* 158   Cardiac Enzymes: No results for input(s): "CKTOTAL", "CKMB", "CKMBINDEX", "TROPONINI" in the last 168 hours. BNP: Invalid input(s): "POCBNP" CBG: Recent Labs  Lab 01/07/22 1206 01/07/22 1607 01/07/22 2046 01/08/22 0117 01/08/22 1222  GLUCAP 134* 126* 144* 148* 163*   D-Dimer No results for input(s): "DDIMER" in the last 72 hours. Hgb A1c No results for input(s): "HGBA1C" in the last 72 hours. Lipid Profile No results for input(s): "CHOL", "HDL", "LDLCALC", "TRIG", "CHOLHDL", "LDLDIRECT" in the last 72 hours. Thyroid function studies No results for input(s): "TSH", "T4TOTAL", "T3FREE", "THYROIDAB" in the last 72 hours.  Invalid input(s): "FREET3" Anemia work up No results for input(s): "VITAMINB12", "FOLATE", "FERRITIN", "TIBC", "IRON", "RETICCTPCT" in the last 72 hours. Urinalysis    Component Value Date/Time   COLORURINE AMBER (A) 01/03/2022 0356   APPEARANCEUR HAZY (A) 01/03/2022 0356   LABSPEC 1.017 01/03/2022 0356   PHURINE 5.0 01/03/2022 0356   GLUCOSEU NEGATIVE 01/03/2022 0356   HGBUR LARGE (A) 01/03/2022 0356   BILIRUBINUR NEGATIVE 01/03/2022 0356   KETONESUR NEGATIVE 01/03/2022 0356   PROTEINUR 30 (A) 01/03/2022 0356   NITRITE NEGATIVE 01/03/2022 0356   LEUKOCYTESUR TRACE (A) 01/03/2022 0356   Sepsis Labs Recent Labs  Lab 01/08/22 0055 01/09/22 0340 01/10/22 0323 01/11/22 0339  WBC 23.4* 20.0* 18.4* 14.8*   Microbiology Recent Results (from the past 240 hour(s))  Culture, blood (Routine x 2)     Status: Abnormal   Collection Time: 01/02/22  8:30 PM   Specimen: BLOOD RIGHT FOREARM  Result Value Ref Range Status   Specimen Description BLOOD RIGHT FOREARM  Final   Special Requests   Final    BOTTLES DRAWN AEROBIC AND ANAEROBIC Blood Culture results may not be optimal due to an inadequate volume of blood received in culture bottles   Culture  Setup Time   Final    GRAM NEGATIVE RODS IN BOTH AEROBIC AND ANAEROBIC  BOTTLES CRITICAL VALUE NOTED.  VALUE IS CONSISTENT WITH PREVIOUSLY REPORTED AND CALLED VALUE. Performed at [facility], [place] [address] [place], [place] [address]    Culture ESCHERICHIA COLI (A)  Final   Report Status 01/05/2022 FINAL  Final  Culture, blood (Routine  x 2)     Status: Abnormal   Collection Time: 01/03/22  2:35 AM   Specimen: BLOOD LEFT FOREARM  Result Value Ref Range Status   Specimen Description BLOOD LEFT FOREARM  Final   Special Requests   Final    BOTTLES DRAWN AEROBIC AND ANAEROBIC Blood Culture adequate volume   Culture  Setup Time   Final    GRAM NEGATIVE RODS IN BOTH AEROBIC AND ANAEROBIC BOTTLES CRITICAL RESULT CALLED TO, READ BACK BY AND VERIFIED WITH: P. DANG PHARMD, AT 1811 01/03/22 D. VANHOOK Performed at [facility], [place] [address] [place], [place] [address]    Culture ESCHERICHIA COLI (A)  Final   Report Status 01/05/2022 FINAL  Final   Organism ID, Bacteria ESCHERICHIA COLI  Final      Susceptibility   Escherichia coli - MIC*    AMPICILLIN >=32 RESISTANT Resistant     CEFAZOLIN <=4 SENSITIVE Sensitive     CEFEPIME <=0.12 SENSITIVE Sensitive     CEFTAZIDIME <=1 SENSITIVE Sensitive     CEFTRIAXONE <=0.25 SENSITIVE Sensitive     CIPROFLOXACIN <=0.25 SENSITIVE Sensitive     GENTAMICIN <=1 SENSITIVE Sensitive     IMIPENEM <=0.25 SENSITIVE Sensitive     TRIMETH/SULFA >=320 RESISTANT Resistant     AMPICILLIN/SULBACTAM >=32 RESISTANT Resistant     PIP/TAZO >=128 RESISTANT Resistant     * ESCHERICHIA COLI  Blood Culture ID Panel (Reflexed)     Status: Abnormal   Collection Time: 01/03/22  2:35 AM  Result Value Ref Range Status   Enterococcus faecalis NOT DETECTED NOT DETECTED Final   Enterococcus Faecium NOT DETECTED NOT DETECTED Final   Listeria monocytogenes NOT DETECTED NOT DETECTED Final   Staphylococcus species NOT DETECTED NOT DETECTED Final   Staphylococcus aureus (BCID) NOT DETECTED NOT DETECTED Final   Staphylococcus epidermidis  NOT DETECTED NOT DETECTED Final   Staphylococcus lugdunensis NOT DETECTED NOT DETECTED Final   Streptococcus species NOT DETECTED NOT DETECTED Final   Streptococcus agalactiae NOT DETECTED NOT DETECTED Final   Streptococcus pneumoniae NOT DETECTED NOT DETECTED Final   Streptococcus pyogenes NOT DETECTED NOT DETECTED Final   A.calcoaceticus-baumannii NOT DETECTED NOT DETECTED Final   Bacteroides fragilis NOT DETECTED NOT DETECTED Final   Enterobacterales DETECTED (A) NOT DETECTED Final    Comment: Enterobacterales represent a large order of gram negative bacteria, not a single organism. CRITICAL RESULT CALLED TO, READ BACK BY AND VERIFIED WITH: P. DANG PHARMD, AT 1811 01/03/22 D. VANHOOK    Enterobacter cloacae complex NOT DETECTED NOT DETECTED Final   Escherichia coli DETECTED (A) NOT DETECTED Final    Comment: CRITICAL RESULT CALLED TO, READ BACK BY AND VERIFIED WITH: P. DANG PHARMD, AT 1811 01/03/22 D. VANHOOK    Klebsiella aerogenes NOT DETECTED NOT DETECTED Final   Klebsiella oxytoca NOT DETECTED NOT DETECTED Final   Klebsiella pneumoniae NOT DETECTED NOT DETECTED Final   Proteus species NOT DETECTED NOT DETECTED Final   Salmonella species NOT DETECTED NOT DETECTED Final   Serratia marcescens NOT DETECTED NOT DETECTED Final   Haemophilus influenzae NOT DETECTED NOT DETECTED Final   Neisseria meningitidis NOT DETECTED NOT DETECTED Final   Pseudomonas aeruginosa NOT DETECTED NOT DETECTED Final   Stenotrophomonas maltophilia NOT DETECTED NOT DETECTED Final   Candida albicans NOT DETECTED NOT DETECTED Final   Candida auris NOT DETECTED NOT DETECTED Final   Candida glabrata NOT DETECTED NOT DETECTED Final   Candida krusei NOT DETECTED NOT DETECTED Final   Candida parapsilosis  NOT DETECTED NOT DETECTED Final   Candida tropicalis NOT DETECTED NOT DETECTED Final   Cryptococcus neoformans/gattii NOT DETECTED NOT DETECTED Final   CTX-M ESBL NOT DETECTED NOT DETECTED Final   Carbapenem  resistance IMP NOT DETECTED NOT DETECTED Final   Carbapenem resistance KPC NOT DETECTED NOT DETECTED Final   Carbapenem resistance NDM NOT DETECTED NOT DETECTED Final   Carbapenem resist OXA 48 LIKE NOT DETECTED NOT DETECTED Final   Carbapenem resistance VIM NOT DETECTED NOT DETECTED Final    Comment: Performed at [facility], [place] [address] [place], [place] [address]  Resp Panel by RT-PCR (Flu A&B, Covid) Anterior Nasal Swab     Status: None   Collection Time: 01/03/22  2:40 AM   Specimen: Anterior Nasal Swab  Result Value Ref Range Status   SARS Coronavirus 2 by RT PCR NEGATIVE NEGATIVE Final    Comment: (NOTE) SARS-CoV-2 target nucleic acids are NOT DETECTED.  The SARS-CoV-2 RNA is generally detectable in upper respiratory specimens during the acute phase of infection. The lowest concentration of SARS-CoV-2 viral copies this assay can detect is 138 copies/mL. A negative result does not preclude SARS-Cov-2 infection and should not be used as the sole basis for treatment or other patient management decisions. A negative result may occur with  improper specimen collection/handling, submission of specimen other than nasopharyngeal swab, presence of viral mutation(s) within the areas targeted by this assay, and inadequate number of viral copies(<138 copies/mL). A negative result must be combined with clinical observations, patient history, and epidemiological information. The expected result is Negative.  Fact Sheet for Patients:  EntrepreneurPulse.com.au  Fact Sheet for Healthcare Providers:  IncredibleEmployment.be  This test is no t yet approved or cleared by the [place] FDA and  has been authorized for detection and/or diagnosis of SARS-CoV-2 by FDA under an Emergency Use Authorization (EUA). This EUA will remain  in effect (meaning this test can be used) for the duration of the COVID-19 declaration under Section 564(b)(1) of the  Act, 21 U.S.C.section 360bbb-3(b)(1), unless the authorization is terminated  or revoked sooner.       Influenza A by PCR NEGATIVE NEGATIVE Final   Influenza B by PCR NEGATIVE NEGATIVE Final    Comment: (NOTE) The Xpert Xpress SARS-CoV-2/FLU/RSV plus assay is intended as an aid in the diagnosis of influenza from Nasopharyngeal swab specimens and should not be used as a sole basis for treatment. Nasal washings and aspirates are unacceptable for Xpert Xpress SARS-CoV-2/FLU/RSV testing.  Fact Sheet for Patients: EntrepreneurPulse.com.au  Fact Sheet for Healthcare Providers: IncredibleEmployment.be  This test is not yet approved or cleared by the [place] FDA and has been authorized for detection and/or diagnosis of SARS-CoV-2 by FDA under an Emergency Use Authorization (EUA). This EUA will remain in effect (meaning this test can be used) for the duration of the COVID-19 declaration under Section 564(b)(1) of the Act, 21 U.S.C. section 360bbb-3(b)(1), unless the authorization is terminated or revoked.  Performed at [facility], [place] [address] [place],  [address]   Urine Culture     Status: Abnormal   Collection Time: 01/03/22  3:56 AM   Specimen: Urine  Result Value Ref Range Status   Specimen Description IN/OUT CATH URINE  Final   Special Requests   Final    NONE Performed at [facility], [place] [address] [place] [address], [place], [place] [address]    Culture 40,000 COLONIES/mL ESCHERICHIA COLI (A)  Final   Report Status 01/05/2022  FINAL  Final   Organism ID, Bacteria ESCHERICHIA COLI (A)  Final      Susceptibility   Escherichia coli - MIC*    AMPICILLIN >=32 RESISTANT Resistant     CEFAZOLIN <=4 SENSITIVE Sensitive     CEFEPIME <=0.12 SENSITIVE Sensitive     CEFTRIAXONE <=0.25 SENSITIVE Sensitive     CIPROFLOXACIN <=0.25 SENSITIVE Sensitive     GENTAMICIN <=1 SENSITIVE Sensitive     IMIPENEM <=0.25 SENSITIVE Sensitive      NITROFURANTOIN <=16 SENSITIVE Sensitive     TRIMETH/SULFA >=320 RESISTANT Resistant     AMPICILLIN/SULBACTAM >=32 RESISTANT Resistant     PIP/TAZO 64 INTERMEDIATE Intermediate     * 40,000 COLONIES/mL ESCHERICHIA COLI  MRSA Next Gen by PCR, Nasal     Status: None   Collection Time: 01/03/22  5:37 AM   Specimen: Nasal Mucosa; Nasal Swab  Result Value Ref Range Status   MRSA by PCR Next Gen NOT DETECTED NOT DETECTED Final    Comment: (NOTE) The GeneXpert MRSA Assay (FDA approved for NASAL specimens only), is one component of a comprehensive MRSA colonization surveillance program. It is not intended to diagnose MRSA infection nor to guide or monitor treatment for MRSA infections. Test performance is not FDA approved in patients less than 61 years old. Performed at [facility], [place] [address] [place], [place] [address]      Time coordinating discharge: Over 30 minutes  SIGNED:   Phillips Climes, MD  [facility] 01/11/2022, 1:47 PM Pager   If 7PM-7AM, please contact night-coverage [email or web address]

## 2022-01-13 ENCOUNTER — Encounter: Payer: Self-pay | Admitting: Nurse Practitioner

## 2022-01-13 ENCOUNTER — Ambulatory Visit: Payer: Self-pay | Attending: Nurse Practitioner | Admitting: Nurse Practitioner

## 2022-01-13 ENCOUNTER — Telehealth: Payer: Self-pay

## 2022-01-13 VITALS — BP 137/69 | HR 79 | Temp 98.3°F | Resp 20 | Wt 183.0 lb

## 2022-01-13 DIAGNOSIS — E876 Hypokalemia: Secondary | ICD-10-CM

## 2022-01-13 DIAGNOSIS — D696 Thrombocytopenia, unspecified: Secondary | ICD-10-CM

## 2022-01-13 DIAGNOSIS — R8290 Unspecified abnormal findings in urine: Secondary | ICD-10-CM

## 2022-01-13 DIAGNOSIS — D72829 Elevated white blood cell count, unspecified: Secondary | ICD-10-CM

## 2022-01-13 DIAGNOSIS — B001 Herpesviral vesicular dermatitis: Secondary | ICD-10-CM

## 2022-01-13 DIAGNOSIS — Z7689 Persons encountering health services in other specified circumstances: Secondary | ICD-10-CM

## 2022-01-13 DIAGNOSIS — Z09 Encounter for follow-up examination after completed treatment for conditions other than malignant neoplasm: Secondary | ICD-10-CM

## 2022-01-13 MED ORDER — ACYCLOVIR 5 % EX OINT: 1.0000 | TOPICAL_OINTMENT | CUTANEOUS | 3 refills | Status: DC

## 2022-01-13 NOTE — Progress Notes (Signed)
Assessment & Plan:  Curtis [name] was seen today for hospitalization follow-up and establish care.  Diagnoses and all orders for this visit:  Hospital discharge follow-up Follow up with Urology   Encounter to establish care  Herpes labialis -     acyclovir ointment (ZOVIRAX) 5 %; Apply 1 Application topically every 3 (three) hours.  Thrombocytopenia (HCC) -     CBC with Differential; Future  Leukocytosis, unspecified type -     CBC with Differential; Future  Hypokalemia -     CMP14+EGFR; Future    Patient has been counseled on age-appropriate routine health concerns for screening and prevention. These are reviewed and up-to-date. Referrals have been placed accordingly. Immunizations are up-to-date or declined.    Subjective:   Chief Complaint  Patient presents with   Hospitalization Follow-up   Establish Care   HPI Curtis [name] 66 y.o. male presents to office today to establish care and for hospital follow up. 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     HFU 01-03-22: Septic shock (Ecoli blood cx)/urosepsis with infected right ureteral stone.  Required pressors for BP support and multiple IV abx. Underwent right ureteral stent placement. Needs to follow up with Urology Dr. Milford Cage. Completed PO abx.   Today he states he is feeling much better. Unfortunately it is too early for repeat labs as he was just discharged 2 days ago. He has been instructed to return in 2 weeks to repeat CBC and CMP. I refilled his acyclovir cream as he has numerous herpes labialis lesions today.  Blood pressure is normal today. He will see me for his future primary care needs and continue with oncology, urology and surgical follow ups.  BP Readings from Last 3 Encounters:  01/13/22 137/69  01/11/22 (!) 160/58  12/15/21 (!) 142/67       Review of Systems  Constitutional:  Negative for fever, malaise/fatigue and  weight loss.  HENT: Negative.  Negative for nosebleeds.   Eyes: Negative.  Negative for blurred vision, double vision and photophobia.  Respiratory: Negative.  Negative for cough and shortness of breath.   Cardiovascular: Negative.  Negative for chest pain, palpitations and leg swelling.  Gastrointestinal: Negative.  Negative for heartburn, nausea and vomiting.  Musculoskeletal: Negative.  Negative for myalgias.  Skin:  Positive for itching and rash.  Neurological: Negative.  Negative for dizziness, focal weakness, seizures and headaches.  Psychiatric/Behavioral: Negative.  Negative for suicidal ideas.      Past Medical History:  Diagnosis Date   Rectosigmoid cancer [facility])     Past Surgical History:  Procedure Laterality Date   BIOPSY  01/21/2021   Procedure: BIOPSY;  Surgeon: Mauri Pole, MD;  Location: [facility];  Service: Endoscopy;;   COLON RESECTION N/A 01/21/2021   Procedure: LAPAROSCOPIC ASSISTED LOOP COLOSTOMY;  Surgeon: Donnie Mesa, MD;  Location: [facility];  Service: General;  Laterality: N/A;   CYSTOSCOPY W/ URETERAL STENT PLACEMENT Right 01/03/2022   Procedure: CYSTOSCOPY WITH  URETERAL STENT PLACEMENT;  Surgeon: Remi Haggard, MD;  Location: [facility];  Service: Urology;  Laterality: Right;   FLEXIBLE SIGMOIDOSCOPY N/A 01/21/2021   Procedure: FLEXIBLE SIGMOIDOSCOPY;  Surgeon: Mauri Pole, MD;  Location: [facility];  Service: Endoscopy;  Laterality: N/A;   IR IMAGING GUIDED PORT INSERTION  03/04/2021   SUBMUCOSAL TATTOO INJECTION  01/21/2021   Procedure: SUBMUCOSAL TATTOO INJECTION;  Surgeon: Mauri Pole, MD;  Location: [facility];  Service: Endoscopy;;  Family History  Problem Relation Age of Onset   Hypertension Mother    Hypertension Father    Breast cancer Sister     Social History Reviewed with no changes to be made today.   Outpatient Medications Prior to Visit  Medication Sig Dispense Refill   acetaminophen (TYLENOL) 500 MG  tablet Take 1,000 mg by mouth every 8 (eight) hours as needed for headache or moderate pain.     ciprofloxacin (CIPRO) 500 MG tablet Take 1 tablet (500 mg total) by mouth 2 (two) times daily for 5 days. 10 tablet 0   lidocaine-prilocaine (EMLA) cream Apply 1 application topically as needed. (Patient taking differently: Apply 1 application  topically as needed (prior blood work).) 30 g 0   loperamide (IMODIUM) 2 MG capsule Take 2 mg by mouth as needed for diarrhea or loose stools. Up to 6 /day     potassium chloride SA (KLOR-CON M20) 20 MEQ tablet Please take 1 tablet twice daily x5 days then 1 tablet oral daily. 30 tablet 0   ondansetron (ZOFRAN) 8 MG tablet Take 1 tablet (8 mg total) by mouth every 8 (eight) hours as needed. (Patient not taking: Reported on 01/13/2022) 30 tablet 0   No facility-administered medications prior to visit.    Allergies  Allergen Reactions   Cefazolin Hives    Tolerated cefepime/ceftriaxone   Leucovorin Itching and Rash    Pt. had a reaction during infusion with Oxaliplatin. Complained of itching "all over". Red, raised rash noted to anterior chest, upper back, and abdominal area. See progress note 06/22/21  Medicated pt. with Benadryl 50 mg IV then Solumedrol $RemoveBeforeD'125mg'[id number]$  IV. Waited 20 minutes and chemotherapy restarted   Oxaliplatin Itching and Rash    Pt. had a reaction of itching all over and red, raised rash noted to anterior upper chest, upper back, and abdominal area. See progress note 06/22/21 Medicated pt. with Benadryl 50 mg IV then Solumedrol $RemoveBeforeD'125mg'eALcifhGtbAmJx$  IV. Waited 20 minutes and chemotherapy restarted       Objective:    BP 137/69 (BP Location: Left Arm, Patient Position: Sitting, Cuff Size: Normal)   Pulse 79   Temp 98.3 F (36.8 C) (Oral)   Resp 20   Wt 183 lb (83 kg)   SpO2 100%   BMI 26.26 kg/m  Wt Readings from Last 3 Encounters:  01/13/22 183 lb (83 kg)  01/11/22 194 lb 10.7 oz (88.3 kg)  12/02/21 186 lb 6.4 oz (84.6 kg)    Physical  Exam Vitals and nursing note reviewed.  Constitutional:      Appearance: He is well-developed.  HENT:     Head: Normocephalic and atraumatic.     Comments: Numerous herpes labialis lesions Cardiovascular:     Rate and Rhythm: Normal rate and regular rhythm.     Heart sounds: Normal heart sounds. No murmur heard.    No friction rub. No gallop.  Pulmonary:     Effort: Pulmonary effort is normal. No tachypnea or respiratory distress.     Breath sounds: Normal breath sounds. No decreased breath sounds, wheezing, rhonchi or rales.  Chest:     Chest wall: No tenderness.  Abdominal:     General: Bowel sounds are normal.     Palpations: Abdomen is soft.  Musculoskeletal:        General: Normal range of motion.     Cervical back: Normal range of motion.  Skin:    General: Skin is warm and dry.  Neurological:  Mental Status: He is alert and oriented to person, place, and time.     Coordination: Coordination normal.  Psychiatric:        Behavior: Behavior normal. Behavior is cooperative.        Thought Content: Thought content normal.        Judgment: Judgment normal.          Patient has been counseled extensively about nutrition and exercise as well as the importance of adherence with medications and regular follow-up. The patient was given clear instructions to go to ER or return to medical center if symptoms don't improve, worsen or new problems develop. The patient verbalized understanding.   Follow-up: Return for labs 2 weeks.Gildardo Pounds, FNP-BC [facility] [place], [place]   01/13/2022, 4:14 PM

## 2022-01-15 ENCOUNTER — Ambulatory Visit (HOSPITAL_COMMUNITY)
Admission: RE | Admit: 2022-01-15 | Discharge: 2022-01-15 | Disposition: A | Discharge status: Home / Self Care | Payer: Self-pay | Source: Ambulatory Visit | Attending: Nurse Practitioner | Admitting: Nurse Practitioner

## 2022-01-15 DIAGNOSIS — L24B1 Irritant contact dermatitis related to digestive stoma or fistula: Secondary | ICD-10-CM

## 2022-01-15 DIAGNOSIS — Z933 Colostomy status: Secondary | ICD-10-CM | POA: Insufficient documentation

## 2022-01-15 DIAGNOSIS — Z432 Encounter for attention to ileostomy: Secondary | ICD-10-CM

## 2022-01-15 DIAGNOSIS — Z87442 Personal history of urinary calculi: Secondary | ICD-10-CM | POA: Insufficient documentation

## 2022-01-15 DIAGNOSIS — Z9289 Personal history of other medical treatment: Secondary | ICD-10-CM

## 2022-01-15 NOTE — Progress Notes (Signed)
[facility]   Reason for visit:  LUQ colostomy (placed in previous colostomy site at [facility])  Recent hospitalization for urosepsis with infected right ureteral stone present.  HAs follow up with urology.  Today, presents with pouch leaking and stool has contaminated his clothing completely.  He states he is unsure how this happened since he left home. Pouching has been stable with 1 piece convex with barrier strips and belt.  He had vietnamese food last night and there is copious undigested fibrous solids present in pouch and being expelled.  HPI:  Rectosigmoid cancer with resection.  Initially had colostomy and required additional surgery with ileostomy in old colostomy site. Effluent is liquid and fairly high output at times.  ROS  Review of Systems  Constitutional:  Positive for activity change, fatigue and fever.       Recent urosepsis.  Temp is normal this morning.   Gastrointestinal:        High output ileostomy  Genitourinary:        Infection with right ureteral stone, treated with recent hospitalization. States he feels much better and is following up with urology.   Skin:  Positive for rash.       Irritant contact dermatitis to peristomal skin due to recent leaks  Psychiatric/Behavioral:  Positive for sleep disturbance.        Up every 20 mins to empty pouch.  Not sleeping well.  Is hopeful being back in his usual pouch he can stretch this out.   All other systems reviewed and are negative.  Vital signs:  BP 137/74 (BP Location: Right Arm)   Pulse 99   Temp 97.9 F (36.6 C) (Oral)   Resp 18   SpO2 100%  Exam:  Physical Exam Vitals reviewed.  HENT:     Mouth/Throat:     Mouth: Mucous membranes are moist.  Abdominal:     Palpations: Abdomen is soft.     Comments: LUQ ileostomy  Skin:    Findings: Rash present.     Comments: Peristomal breakdown  Neurological:     Mental Status: He is alert and oriented to person, place, and time.  Psychiatric:         Mood and Affect: Mood normal.        Behavior: Behavior normal.     Stoma type/location:  1" pink and moist  os points down and more flush on bottom hald.  Stomal assessment/size:  1" budded Peristomal assessment:  Partial thickness breakdown from this recent hospitalization where his usual pouches weren't available and he experienced leaks and some breakdown.  Treatment options for stomal/peristomal skin: skin is cleansed, all soiled clothes removed.  Stoma powder and skin prep used to protect skin.  Barrier ring at creasing in 3 and 9 o'clock position and circumferentially.  1 piece convex pouch and ostomy belt placed.  Output: liquid effluent with solid particulates.  Ostomy pouching: 1pc. Convex  (see above)  barrier strips to perimeter of pouching area for additional support.  Assisted with removing EKG leads that were still in place from hospitalization.  Education provided:  Given disposable scrubs to wear home and clothing placed in belongings bag and given to patient.  3 sets of pouches and rings provided.  ORders from [employer] and [facility].  Self pay    Impression/dx  Ileostomy with irritant dermatitis Discussion  See back in 2 weeks. Reversal anticipated for August or September unclear. Plan  Appointment made    Visit time:  50 minutes.   [name] FNP-BC

## 2022-01-18 ENCOUNTER — Other Ambulatory Visit (HOSPITAL_COMMUNITY): Payer: Self-pay | Admitting: Nurse Practitioner

## 2022-01-18 DIAGNOSIS — Z432 Encounter for attention to ileostomy: Secondary | ICD-10-CM

## 2022-01-18 DIAGNOSIS — L24B3 Irritant contact dermatitis related to fecal or urinary stoma or fistula: Secondary | ICD-10-CM

## 2022-01-19 ENCOUNTER — Other Ambulatory Visit: Payer: Self-pay | Admitting: Urology

## 2022-01-20 NOTE — Progress Notes (Signed)
We need an order to access pt.'s port for labs the day of his PAT appointment,as per pt.'s request.Thanks.

## 2022-01-22 ENCOUNTER — Other Ambulatory Visit: Payer: Self-pay | Admitting: Urology

## 2022-01-27 ENCOUNTER — Ambulatory Visit: Payer: Self-pay | Attending: Nurse Practitioner

## 2022-01-27 DIAGNOSIS — D72829 Elevated white blood cell count, unspecified: Secondary | ICD-10-CM

## 2022-01-27 DIAGNOSIS — D696 Thrombocytopenia, unspecified: Secondary | ICD-10-CM

## 2022-01-27 DIAGNOSIS — Z09 Encounter for follow-up examination after completed treatment for conditions other than malignant neoplasm: Secondary | ICD-10-CM

## 2022-01-27 DIAGNOSIS — R8290 Unspecified abnormal findings in urine: Secondary | ICD-10-CM

## 2022-01-27 DIAGNOSIS — E876 Hypokalemia: Secondary | ICD-10-CM

## 2022-01-28 ENCOUNTER — Encounter: Payer: Self-pay | Admitting: Hematology and Oncology

## 2022-01-28 LAB — CMP14+EGFR
ALT: 27 IU/L (ref 0–44)
AST: 24 IU/L (ref 0–40)
Albumin/Globulin Ratio: 1.2 (ref 1.2–2.2)
Albumin: 4.2 g/dL (ref 3.8–4.8)
Alkaline Phosphatase: 170 IU/L — ABNORMAL HIGH (ref 44–121)
BUN/Creatinine Ratio: 13 (ref 10–24)
BUN: 19 mg/dL (ref 8–27)
Bilirubin Total: 0.6 mg/dL (ref 0.0–1.2)
CO2: 17 mmol/L — ABNORMAL LOW (ref 20–29)
Calcium: 9.7 mg/dL (ref 8.6–10.2)
Chloride: 107 mmol/L — ABNORMAL HIGH (ref 96–106)
Creatinine, Ser: 1.45 mg/dL — ABNORMAL HIGH (ref 0.76–1.27)
Globulin, Total: 3.5 g/dL (ref 1.5–4.5)
Glucose: 159 mg/dL — ABNORMAL HIGH (ref 70–99)
Potassium: 4.6 mmol/L (ref 3.5–5.2)
Sodium: 138 mmol/L (ref 134–144)
Total Protein: 7.7 g/dL (ref 6.0–8.5)
eGFR: 54 mL/min/{1.73_m2} — ABNORMAL LOW (ref >59)

## 2022-01-28 LAB — URINALYSIS, COMPLETE
Bilirubin, UA: NEGATIVE
Glucose, UA: NEGATIVE
Ketones, UA: NEGATIVE
Nitrite, UA: NEGATIVE
Specific Gravity, UA: 1.017 (ref 1.005–1.030)
Urobilinogen, Ur: 0.2 mg/dL (ref 0.2–1.0)
pH, UA: 5.5 (ref 5.0–7.5)

## 2022-01-28 LAB — CBC WITH DIFFERENTIAL/PLATELET
Basophils Absolute: 0.1 10*3/uL (ref 0.0–0.2)
Basos: 2 %
EOS (ABSOLUTE): 0.3 10*3/uL (ref 0.0–0.4)
Eos: 8 %
Hematocrit: 31.9 % — ABNORMAL LOW (ref 37.5–51.0)
Hemoglobin: 10.1 g/dL — ABNORMAL LOW (ref 13.0–17.7)
Immature Grans (Abs): 0 10*3/uL (ref 0.0–0.1)
Immature Granulocytes: 0 %
Lymphocytes Absolute: 1 10*3/uL (ref 0.7–3.1)
Lymphs: 23 %
MCH: 28.5 pg (ref 26.6–33.0)
MCHC: 31.7 g/dL (ref 31.5–35.7)
MCV: 90 fL (ref 79–97)
Monocytes Absolute: 0.3 10*3/uL (ref 0.1–0.9)
Monocytes: 8 %
Neutrophils Absolute: 2.7 10*3/uL (ref 1.4–7.0)
Neutrophils: 59 %
Platelets: 293 10*3/uL (ref 150–450)
RBC: 3.54 x10E6/uL — ABNORMAL LOW (ref 4.14–5.80)
RDW: 14.9 % (ref 11.6–15.4)
WBC: 4.5 10*3/uL (ref 3.4–10.8)

## 2022-01-28 LAB — MICROSCOPIC EXAMINATION
Bacteria, UA: NONE SEEN
Casts: NONE SEEN /lpf
RBC, Urine: >30 /hpf — AB (ref 0–2)

## 2022-01-29 ENCOUNTER — Encounter (HOSPITAL_COMMUNITY): Payer: Self-pay | Admitting: Nurse Practitioner

## 2022-01-29 ENCOUNTER — Ambulatory Visit (HOSPITAL_COMMUNITY)
Admission: RE | Admit: 2022-01-29 | Discharge: 2022-01-29 | Disposition: A | Discharge status: Home / Self Care | Payer: Self-pay | Source: Ambulatory Visit | Attending: Nurse Practitioner | Admitting: Nurse Practitioner

## 2022-01-29 DIAGNOSIS — Z87442 Personal history of urinary calculi: Secondary | ICD-10-CM | POA: Insufficient documentation

## 2022-01-29 DIAGNOSIS — R198 Other specified symptoms and signs involving the digestive system and abdomen: Secondary | ICD-10-CM

## 2022-01-29 DIAGNOSIS — C19 Malignant neoplasm of rectosigmoid junction: Secondary | ICD-10-CM | POA: Insufficient documentation

## 2022-01-29 DIAGNOSIS — L24B3 Irritant contact dermatitis related to fecal or urinary stoma or fistula: Secondary | ICD-10-CM

## 2022-01-29 DIAGNOSIS — Z932 Ileostomy status: Secondary | ICD-10-CM

## 2022-01-29 NOTE — Discharge Instructions (Signed)
Continue pouching the same  See back in 2 weeks

## 2022-01-29 NOTE — Progress Notes (Signed)
[facility]   Reason for visit:  LUQ ileostomy, placed in previous colostomy site at [facility] HPI:  Rectosigmoid cancer with resection Recent hospitalization with sepsis from infected ureteral stone ROS  Review of Systems  Constitutional:        Recent hospitalization with sepsis from ureteral stone  Gastrointestinal:        LUQ ileostomy  Skin:  Positive for rash.       Dermatitis to peristomal skin  All other systems reviewed and are negative.  Vital signs:  BP 117/79 (BP Location: Right Arm)   Pulse 100   Temp 98.3 F (36.8 C) (Oral)   Resp 18   SpO2 98%  Exam:  Physical Exam Vitals reviewed.  Constitutional:      Appearance: Normal appearance.  Abdominal:     Palpations: Abdomen is soft.  Neurological:     Mental Status: He is alert and oriented to person, place, and time.  Psychiatric:        Mood and Affect: Mood normal.        Behavior: Behavior normal.        Thought Content: Thought content normal.     Comments: Reports sleeping better. Feeling much better     Stoma type/location:  LUQ stoma Stomal assessment/size:  1" pink and  moist Peristomal assessment:  dermatitis improving.   Treatment options for stomal/peristomal skin: ostomy belt, barrier ring, convex pouch Output: liquid brown stool Ostomy pouching: 1pc.convex Education provided:  Supplies are costly and this is stressful to patient.  I am providing him with 3 pouch sets, rings and barrier strips today.  He has follow up urology appointment in 2 weeks.  He states he feels much better today.  Pouch has been leaking less.     Impression/dx  Ileostomy Recent sepsis from ureteral stone Discussion  Follow up with urology, appointments are scheduled Plan  Call clinic as needed.     Visit time: 50 minutes.   [name] FNP-BC

## 2022-01-29 NOTE — Telephone Encounter (Signed)
Still no return call to schedule appt.

## 2022-01-29 NOTE — Patient Instructions (Signed)
DUE TO COVID-19 ONLY TWO VISITORS  (aged 64 and older)  ARE ALLOWED TO COME WITH YOU AND STAY IN THE WAITING ROOM ONLY DURING PRE OP AND PROCEDURE.   **NO VISITORS ARE ALLOWED IN THE SHORT STAY AREA OR RECOVERY ROOM!!**  IF YOU WILL BE ADMITTED INTO THE HOSPITAL YOU ARE ALLOWED ONLY FOUR SUPPORT PEOPLE DURING VISITATION HOURS ONLY (7 AM -8PM)   The support person(s) must pass our screening, gel in and out, and wear a mask at all times, including in the patient's room. Patients must also wear a mask when staff or their support person are in the room. Visitors GUEST BADGE MUST BE WORN VISIBLY  One adult visitor may remain with you overnight and MUST be in the room by 8 P.M.     Your procedure is scheduled on: 02/09/22   Report to [facility] Main Entrance    Report to admitting at : 9:45 AM   Call this number if you have problems the morning of surgery [phone]   Do not eat food :After Midnight.   After Midnight you may have the following liquids until : 9:00 AM DAY OF SURGERY  Water Black Coffee (sugar ok, NO MILK/CREAM OR CREAMERS)  Tea (sugar ok, NO MILK/CREAM OR CREAMERS) regular and decaf                             Plain Jell-O (NO RED)                                           Fruit ices (not with fruit pulp, NO RED)                                     Popsicles (NO RED)                                                                  Juice: apple, WHITE grape, WHITE cranberry Sports drinks like Gatorade (NO RED) Clear broth(vegetable,chicken,beef)  Oral Hygiene is also important to reduce your risk of infection.                                    Remember - BRUSH YOUR TEETH THE MORNING OF SURGERY WITH YOUR REGULAR TOOTHPASTE   Do NOT smoke after Midnight   Take these medicines the morning of surgery with A SIP OF WATER: Tylenol as needed.  DO NOT TAKE ANY ORAL DIABETIC MEDICATIONS DAY OF YOUR SURGERY  Bring CPAP mask and tubing day of surgery.                               You may not have any metal on your body including hair pins, jewelry, and body piercing             Do not wear lotions, powders, perfumes/cologne, or deodorant  Men may shave face and neck.   Do not bring valuables to the hospital. [place].   Contacts, dentures or bridgework may not be worn into surgery.   Bring small overnight bag day of surgery.   DO NOT [place]. PHARMACY WILL DISPENSE MEDICATIONS LISTED ON YOUR MEDICATION LIST TO YOU DURING YOUR ADMISSION [place]!    Patients discharged on the day of surgery will not be allowed to drive home.  Someone NEEDS to stay with you for the first 24 hours after anesthesia.   Special Instructions: Bring a copy of your healthcare power of attorney and living will documents         the day of surgery if you haven't scanned them before.              Please read over the following fact sheets you were given: IF YOU HAVE QUESTIONS ABOUT YOUR PRE-OP INSTRUCTIONS PLEASE CALL [phone]     [facility] - Preparing for Surgery Before surgery, you can play an important role.  Because skin is not sterile, your skin needs to be as free of germs as possible.  You can reduce the number of germs on your skin by washing with CHG (chlorahexidine gluconate) soap before surgery.  CHG is an antiseptic cleaner which kills germs and bonds with the skin to continue killing germs even after washing. Please DO NOT use if you have an allergy to CHG or antibacterial soaps.  If your skin becomes reddened/irritated stop using the CHG and inform your nurse when you arrive at Short Stay. Do not shave (including legs and underarms) for at least 48 hours prior to the first CHG shower.  You may shave your face/neck. Please follow these instructions carefully:  1.  Shower with CHG Soap the night before surgery and the  morning of Surgery.  2.  If you  choose to wash your hair, wash your hair first as usual with your  normal  shampoo.  3.  After you shampoo, rinse your hair and body thoroughly to remove the  shampoo.                           4.  Use CHG as you would any other liquid soap.  You can apply chg directly  to the skin and wash                       Gently with a scrungie or clean washcloth.  5.  Apply the CHG Soap to your body ONLY FROM THE NECK DOWN.   Do not use on face/ open                           Wound or open sores. Avoid contact with eyes, ears mouth and genitals (private parts).                       Wash face,  Genitals (private parts) with your normal soap.             6.  Wash thoroughly, paying special attention to the area where your surgery  will be performed.  7.  Thoroughly rinse your body with warm water from the neck down.  8.  DO NOT shower/wash with your normal soap after using and rinsing off  the CHG Soap.                9.  Pat yourself dry with a clean towel.            10.  Wear clean pajamas.            11.  Place clean sheets on your bed the night of your first shower and do not  sleep with pets. Day of Surgery : Do not apply any lotions/deodorants the morning of surgery.  Please wear clean clothes to the hospital/surgery center.  FAILURE TO FOLLOW THESE INSTRUCTIONS MAY RESULT IN THE CANCELLATION OF YOUR SURGERY PATIENT SIGNATURE_________________________________  NURSE SIGNATURE__________________________________  ________________________________________________________________________

## 2022-02-01 ENCOUNTER — Encounter (HOSPITAL_COMMUNITY)
Admission: RE | Admit: 2022-02-01 | Discharge: 2022-02-01 | Disposition: A | Discharge status: Home / Self Care | Payer: Self-pay | Source: Ambulatory Visit | Attending: Urology | Admitting: Urology

## 2022-02-01 ENCOUNTER — Encounter (HOSPITAL_COMMUNITY): Payer: Self-pay

## 2022-02-01 ENCOUNTER — Other Ambulatory Visit: Payer: Self-pay

## 2022-02-01 DIAGNOSIS — Z01812 Encounter for preprocedural laboratory examination: Secondary | ICD-10-CM | POA: Insufficient documentation

## 2022-02-01 HISTORY — DX: Personal history of urinary calculi: Z87.442

## 2022-02-01 NOTE — Progress Notes (Signed)
For Short Stay: [place] appointment date: Date of COVID positive in last 1 days: NO  Bowel Prep reminder:   For Anesthesia: PCP - Geryl Rankins: NP Cardiologist -   Chest x-ray -  EKG -  Stress Test -  ECHO -  Cardiac Cath -  Pacemaker/ICD device last checked: Pacemaker orders received: Device Rep notified:  Spinal Cord Stimulator:  Sleep Study -  CPAP -   Fasting Blood Sugar -  Checks Blood Sugar _____ times a day Date and result of last Hgb A1c-  Blood Thinner Instructions: Aspirin Instructions: Last Dose:  Activity level: Can go up a flight of stairs and activities of daily living without stopping and without chest pain and/or shortness of breath   Able to exercise without chest pain and/or shortness of breath   Unable to go up a flight of stairs without chest pain and/or shortness of breath     Anesthesia review:   Patient denies shortness of breath, fever, cough and chest pain at PAT appointment   Patient verbalized understanding of instructions that were given to them at the PAT appointment. Patient was also instructed that they will need to review over the PAT instructions again at home before surgery.

## 2022-02-05 ENCOUNTER — Encounter (HOSPITAL_COMMUNITY): Payer: Self-pay | Admitting: Nurse Practitioner

## 2022-02-08 ENCOUNTER — Encounter: Payer: Self-pay | Admitting: Hematology and Oncology

## 2022-02-08 NOTE — H&P (Signed)
Patient is a 66 year old male presented with urosepsis back on 01/03/2022. Found to have a 3 mm right distal ureteral calculus which subsequently was stented on 01/03/2022. Had approximate 1 week hospital stay with IV antibiotics but sepsis resolved and is now here for follow-up for definitive management of 3 mm right distal ureteral calculus. Creatinine initially elevated at time of admission to 2.7 but normalized to 1.6. He was discharged home on Cipro after transitioning from IV Rocephin for E. coli UTI and bacteremia. Patient has finished up his antibiotics yesterday and is feeling better but still weak. Patient having no stent discomfort at all.  KUB is taken and reviewed today. This shows no obvious bony abnormalities. Right JJ stent is in good position. No obvious stone noted alongside the stent distally.      ALLERGIES: No Allergies    MEDICATIONS: Potassium Chloride 20 meq tablet, extended release  Tylenol Extra Strength 500 mg tablet     GU PSH: None   NON-GU PSH: L Colectomy/coloproctostomy     GU PMH: None   NON-GU PMH: Malignant neoplasm of rectum    FAMILY HISTORY: No Family History    SOCIAL HISTORY: Marital Status: Divorced Ethnicity: Not Hispanic Or Latino; Race: Other Race Current Smoking Status: Patient does not smoke anymore. Has not smoked since 01/04/2022.   Tobacco Use Assessment Completed: Used Tobacco in last 30 days? Does not use smokeless tobacco. Has never drank.  Does not use drugs. Drinks 1 caffeinated drink per day.    REVIEW OF SYSTEMS:    GU Review Male:   Patient reports frequent urination and get up at night to urinate. Patient denies hard to postpone urination, burning/ pain with urination, leakage of urine, stream starts and stops, trouble starting your stream, have to strain to urinate , erection problems, and penile pain.  Gastrointestinal (Upper):   Patient denies nausea, vomiting, and indigestion/ heartburn.  Gastrointestinal (Lower):    Patient denies diarrhea and constipation.  Constitutional:   Patient reports fatigue. Patient denies fever, night sweats, and weight loss.  Skin:   Patient denies skin rash/ lesion and itching.  Eyes:   Patient denies blurred vision and double vision.  Ears/ Nose/ Throat:   Patient denies sore throat and sinus problems.  Hematologic/Lymphatic:   Patient denies swollen glands and easy bruising.  Cardiovascular:   Patient denies leg swelling and chest pains.  Respiratory:   Patient denies shortness of breath and cough.  Endocrine:   Patient denies excessive thirst.  Musculoskeletal:   Patient reports back pain. Patient denies joint pain.  Neurological:   Patient denies headaches and dizziness.  Psychologic:   Patient denies depression and anxiety.   VITAL SIGNS:      01/18/2022 08:57 AM  Weight 172 lb / 78.02 kg  Height 70 in / 177.8 cm  BP 97/63 mmHg  Pulse 86 /min  Temperature 97.5 F / 36.3 C  BMI 24.7 kg/m   GU PHYSICAL EXAMINATION:    Anus and Perineum: No hemorrhoids. No anal stenosis. No rectal fissure, no anal fissure. No edema, no dimple, no perineal tenderness, no anal tenderness.  Scrotum: No lesions. No edema. No cysts. No warts.  Epididymides: Right: no spermatocele, no masses, no cysts, no tenderness, no induration, no enlargement. Left: no spermatocele, no masses, no cysts, no tenderness, no induration, no enlargement.  Testes: No tenderness, no swelling, no enlargement left testes. No tenderness, no swelling, no enlargement right testes. Normal location left testes. Normal location right testes. No mass,  no cyst, no varicocele, no hydrocele left testes. No mass, no cyst, no varicocele, no hydrocele right testes.  Urethral Meatus: Normal size. No lesion, no wart, no discharge, no polyp. Normal location.  Penis: Circumcised, no warts, no cracks. No dorsal Peyronie's plaques, no left corporal Peyronie's plaques, no right corporal Peyronie's plaques, no scarring, no warts. No  balanitis, no meatal stenosis.  Prostate: 40 gram or 2+ size. Left lobe normal consistency, right lobe normal consistency. Symmetrical lobes. No prostate nodule. Left lobe no tenderness, right lobe no tenderness.  Seminal Vesicles: Nonpalpable.  Sphincter Tone: Normal sphincter. No rectal tenderness. No rectal mass.    MULTI-SYSTEM PHYSICAL EXAMINATION:    Constitutional: Well-nourished. No physical deformities. Normally developed. Good grooming.  Neck: Neck symmetrical, not swollen. Normal tracheal position.  Respiratory: No labored breathing, no use of accessory muscles.   Cardiovascular: Normal temperature, normal extremity pulses, no swelling, no varicosities.  Lymphatic: No enlargement of neck, axillae, groin.  Skin: No paleness, no jaundice, no cyanosis. No lesion, no ulcer, no rash.  Neurologic / Psychiatric: Oriented to time, oriented to place, oriented to person. No depression, no anxiety, no agitation.  Gastrointestinal: No mass, no tenderness, no rigidity, non obese abdomen.  Eyes: Normal conjunctivae. Normal eyelids.  Ears, Nose, Mouth, and Throat: Left ear no scars, no lesions, no masses. Right ear no scars, no lesions, no masses. Nose no scars, no lesions, no masses. Normal hearing. Normal lips.  Musculoskeletal: Normal gait and station of head and neck.     Complexity of Data:  Source Of History:  Patient  Records Review:   Previous Doctor Records, Previous Hospital Records  Urine Test Review:   Urinalysis   PROCEDURES:         KUB - 28786  A single view of the abdomen is obtained.      Patient confirmed No Neulasta OnPro Device.     ASSESSMENT:      ICD-10 Details  1 GU:   Ureteral calculus - V67.2 Acute, Complicated Injury   PLAN:           Orders         Schedule X-Rays: Today 01/14/2022 - KUB          Document Letter(s):  Created for Patient: Clinical Summary         Notes:   Discussed CT and intraoperative findings when stent was placed for 3 mm  distal stone. Recommended we proceed with cystoscopy retrograde ureteroscopy with removal of calculus. We will schedule accordingly in the near future. Risk and benefits discussed as outlined below. Patient agreeable  I have recommended retrograde pyelogram, ureteroscopic stone manipulation with laser lithotripsy. I have discussed in detail the risks, benefits and alternatives of ureteroscopic stone extraction to include but not limited to: Bleeding, infection, ureteral perforation with need for open repair, inability to place the stent necessitating the need for further procedures, possible percutaneous nephrostomy tube placement, discomfort from the stents, hematuria, urgency, frequency and refractory problems after the stent is removed. I discussed the stent is not a permanent stent and will require a followup for stent removal or stent exchange. The patient knows there is high risk for ureteral stent incrustation if this is not removed or exchanged within 3 months. Patient voices understanding of the risks and benefits of the procedure and consents to the procedure.

## 2022-02-09 ENCOUNTER — Ambulatory Visit (HOSPITAL_BASED_OUTPATIENT_CLINIC_OR_DEPARTMENT_OTHER): Payer: Self-pay | Admitting: Anesthesiology

## 2022-02-09 ENCOUNTER — Other Ambulatory Visit: Payer: Self-pay

## 2022-02-09 ENCOUNTER — Ambulatory Visit (HOSPITAL_COMMUNITY): Payer: Self-pay | Admitting: Anesthesiology

## 2022-02-09 ENCOUNTER — Ambulatory Visit (HOSPITAL_COMMUNITY)
Admission: RE | Admit: 2022-02-09 | Discharge: 2022-02-09 | Disposition: A | Discharge status: Home / Self Care | Payer: Self-pay | Source: Ambulatory Visit | Attending: Urology | Admitting: Urology

## 2022-02-09 ENCOUNTER — Ambulatory Visit (HOSPITAL_COMMUNITY): Payer: Self-pay

## 2022-02-09 ENCOUNTER — Encounter (HOSPITAL_COMMUNITY): Payer: Self-pay | Admitting: Urology

## 2022-02-09 ENCOUNTER — Encounter (HOSPITAL_COMMUNITY)
Admission: RE | Disposition: A | Discharge status: Home / Self Care | Payer: Self-pay | Source: Ambulatory Visit | Attending: Urology

## 2022-02-09 DIAGNOSIS — N201 Calculus of ureter: Secondary | ICD-10-CM | POA: Insufficient documentation

## 2022-02-09 DIAGNOSIS — N135 Crossing vessel and stricture of ureter without hydronephrosis: Secondary | ICD-10-CM | POA: Insufficient documentation

## 2022-02-09 HISTORY — PX: CYSTOSCOPY/URETEROSCOPY/HOLMIUM LASER/STENT PLACEMENT: SHX6546

## 2022-02-09 SURGERY — CYSTOSCOPY/URETEROSCOPY/HOLMIUM LASER/STENT PLACEMENT
Anesthesia: General | Site: Penis | Laterality: Right

## 2022-02-09 MED ORDER — IOHEXOL 300 MG/ML  SOLN
INTRAMUSCULAR | Status: DC | PRN
  Administered 2022-02-09: 10 mL

## 2022-02-09 MED ORDER — FENTANYL CITRATE PF 50 MCG/ML IJ SOSY: 25.0000 ug | PREFILLED_SYRINGE | INTRAMUSCULAR | Status: DC | PRN

## 2022-02-09 MED ORDER — DEXAMETHASONE SODIUM PHOSPHATE 10 MG/ML IJ SOLN
INTRAMUSCULAR | Status: AC
  Filled 2022-02-09: qty 1

## 2022-02-09 MED ORDER — EPHEDRINE 5 MG/ML INJ
INTRAVENOUS | Status: AC
  Filled 2022-02-09: qty 5

## 2022-02-09 MED ORDER — ACETAMINOPHEN 500 MG PO TABS: 1000.0000 mg | ORAL_TABLET | Freq: Once | ORAL | Status: DC

## 2022-02-09 MED ORDER — EPHEDRINE SULFATE-NACL 50-0.9 MG/10ML-% IV SOSY
PREFILLED_SYRINGE | INTRAVENOUS | Status: DC | PRN
  Administered 2022-02-09 (×2): 5 mg via INTRAVENOUS

## 2022-02-09 MED ORDER — KETOROLAC TROMETHAMINE 30 MG/ML IJ SOLN
INTRAMUSCULAR | Status: DC | PRN
  Administered 2022-02-09: 30 mg via INTRAVENOUS

## 2022-02-09 MED ORDER — MIDAZOLAM HCL 5 MG/5ML IJ SOLN
INTRAMUSCULAR | Status: DC | PRN
  Administered 2022-02-09: 2 mg via INTRAVENOUS

## 2022-02-09 MED ORDER — MIDAZOLAM HCL 2 MG/2ML IJ SOLN: 0.5000 mg | Freq: Once | INTRAMUSCULAR | Status: DC | PRN

## 2022-02-09 MED ORDER — PROMETHAZINE HCL 25 MG/ML IJ SOLN: 6.2500 mg | INTRAMUSCULAR | Status: DC | PRN

## 2022-02-09 MED ORDER — TRAMADOL HCL 50 MG PO TABS: 50.0000 mg | ORAL_TABLET | Freq: Four times a day (QID) | ORAL | 0 refills | Status: DC | PRN

## 2022-02-09 MED ORDER — OXYCODONE HCL 5 MG PO TABS: 5.0000 mg | ORAL_TABLET | Freq: Once | ORAL | Status: DC | PRN

## 2022-02-09 MED ORDER — LIDOCAINE 2% (20 MG/ML) 5 ML SYRINGE
INTRAMUSCULAR | Status: DC | PRN
  Administered 2022-02-09: 40 mg via INTRAVENOUS

## 2022-02-09 MED ORDER — ORAL CARE MOUTH RINSE
15.0000 mL | Freq: Once | OROMUCOSAL | Status: AC
Start: 2022-02-09 — End: 2022-02-09

## 2022-02-09 MED ORDER — LIDOCAINE HCL (PF) 2 % IJ SOLN
INTRAMUSCULAR | Status: AC
  Filled 2022-02-09: qty 5

## 2022-02-09 MED ORDER — MEPERIDINE HCL 50 MG/ML IJ SOLN: 6.2500 mg | INTRAMUSCULAR | Status: DC | PRN

## 2022-02-09 MED ORDER — 0.9 % SODIUM CHLORIDE (POUR BTL) OPTIME
TOPICAL | Status: DC | PRN
  Administered 2022-02-09: 1000 mL

## 2022-02-09 MED ORDER — KETOROLAC TROMETHAMINE 30 MG/ML IJ SOLN
INTRAMUSCULAR | Status: AC
  Filled 2022-02-09: qty 1

## 2022-02-09 MED ORDER — FENTANYL CITRATE (PF) 100 MCG/2ML IJ SOLN
INTRAMUSCULAR | Status: AC
  Filled 2022-02-09: qty 2

## 2022-02-09 MED ORDER — DEXAMETHASONE SODIUM PHOSPHATE 10 MG/ML IJ SOLN
INTRAMUSCULAR | Status: DC | PRN
  Administered 2022-02-09: 10 mg via INTRAVENOUS

## 2022-02-09 MED ORDER — ONDANSETRON HCL 4 MG/2ML IJ SOLN
INTRAMUSCULAR | Status: DC | PRN
  Administered 2022-02-09: 4 mg via INTRAVENOUS

## 2022-02-09 MED ORDER — FENTANYL CITRATE (PF) 100 MCG/2ML IJ SOLN
INTRAMUSCULAR | Status: DC | PRN
  Administered 2022-02-09 (×2): 25 ug via INTRAVENOUS

## 2022-02-09 MED ORDER — MIDAZOLAM HCL 2 MG/2ML IJ SOLN
INTRAMUSCULAR | Status: AC
Start: 2022-02-09 — End: ?
  Filled 2022-02-09: qty 2

## 2022-02-09 MED ORDER — CIPROFLOXACIN IN D5W 400 MG/200ML IV SOLN
400.0000 mg | Freq: Two times a day (BID) | INTRAVENOUS | Status: DC
  Administered 2022-02-09: 400 mg via INTRAVENOUS
  Filled 2022-02-09: qty 200

## 2022-02-09 MED ORDER — SODIUM CHLORIDE 0.9 % IR SOLN
Status: DC | PRN
  Administered 2022-02-09: 3000 mL

## 2022-02-09 MED ORDER — OXYCODONE HCL 5 MG/5ML PO SOLN: 5.0000 mg | Freq: Once | ORAL | Status: DC | PRN

## 2022-02-09 MED ORDER — PROPOFOL 10 MG/ML IV BOLUS
INTRAVENOUS | Status: DC | PRN
  Administered 2022-02-09: 180 mg via INTRAVENOUS

## 2022-02-09 MED ORDER — ONDANSETRON HCL 4 MG/2ML IJ SOLN
INTRAMUSCULAR | Status: AC
Start: 2022-02-09 — End: ?
  Filled 2022-02-09: qty 2

## 2022-02-09 MED ORDER — PROPOFOL 10 MG/ML IV BOLUS
INTRAVENOUS | Status: AC
  Filled 2022-02-09: qty 20

## 2022-02-09 MED ORDER — LACTATED RINGERS IV SOLN: INTRAVENOUS | Status: DC

## 2022-02-09 MED ORDER — CHLORHEXIDINE GLUCONATE 0.12 % MT SOLN
15.0000 mL | Freq: Once | OROMUCOSAL | Status: AC
  Administered 2022-02-09: 15 mL via OROMUCOSAL

## 2022-02-09 SURGICAL SUPPLY — 23 items
BAG URO CATCHER STRL LF (MISCELLANEOUS) ×2 IMPLANT
BASKET ZERO TIP NITINOL 2.4FR (BASKET) IMPLANT
BSKT STON RTRVL ZERO TP 2.4FR (BASKET)
BULB IRRIG PATHFIND (MISCELLANEOUS) ×2 IMPLANT
CATH URETL OPEN 5X70 (CATHETERS) IMPLANT
CLOTH BEACON ORANGE TIMEOUT ST (SAFETY) ×2 IMPLANT
GLOVE SURG LX 7.5 STRW (GLOVE) ×1
GLOVE SURG LX STRL 7.5 STRW (GLOVE) ×1 IMPLANT
GOWN STRL REUS W/ TWL XL LVL3 (GOWN DISPOSABLE) ×1 IMPLANT
GOWN STRL REUS W/TWL XL LVL3 (GOWN DISPOSABLE) ×2
GUIDEWIRE ANG ZIPWIRE 038X150 (WIRE) IMPLANT
GUIDEWIRE STR DUAL SENSOR (WIRE) ×2 IMPLANT
KIT TURNOVER KIT A (KITS) IMPLANT
LASER FIB FLEXIVA PULSE ID 365 (Laser) IMPLANT
MANIFOLD NEPTUNE II (INSTRUMENTS) ×2 IMPLANT
PACK CYSTO (CUSTOM PROCEDURE TRAY) ×2 IMPLANT
SHEATH NAVIGATOR HD 11/13X36 (SHEATH) IMPLANT
STENT URET 6FRX26 CONTOUR (STENTS) ×1 IMPLANT
SYR 20ML LL LF (SYRINGE) ×2 IMPLANT
TRACTIP FLEXIVA PULS ID 200XHI (Laser) IMPLANT
TRACTIP FLEXIVA PULSE ID 200 (Laser)
TUBING CONNECTING 10 (TUBING) ×2 IMPLANT
TUBING UROLOGY SET (TUBING) ×2 IMPLANT

## 2022-02-09 NOTE — Transfer of Care (Signed)
Immediate Anesthesia Transfer of Care Note  Patient: Curtis [name]  Procedure(s) Performed: CYSTOSCOPY/URETEROSCOPY/STENT EXCHANGE (Right: Penis)  Patient Location: PACU  Anesthesia Type:General  Level of Consciousness: drowsy  Airway & Oxygen Therapy: Patient Spontanous Breathing and Patient connected to face mask oxygen  Post-op Assessment: Report given to RN and Post -op Vital signs reviewed and stable  Post vital signs: Reviewed and stable  Last Vitals:  Vitals Value Taken Time  BP 137/71 02/09/22 1245  Temp    Pulse 70 02/09/22 1245  Resp 12 02/09/22 1245  SpO2 100 % 02/09/22 1245  Vitals shown include unvalidated device data.  Last Pain:  Vitals:   02/09/22 1014  TempSrc:   PainSc: 0-No pain         Complications: No notable events documented.

## 2022-02-09 NOTE — Interval H&P Note (Signed)
History and Physical Interval Note:  02/09/2022 11:02 AM  Curtis [name]  has presented today for surgery, with the diagnosis of RIGHT URETERAL CALCULUS.  The various methods of treatment have been discussed with the patient and family. After consideration of risks, benefits and other options for treatment, the patient has consented to  Procedure(s) with comments: CYSTOSCOPY/URETEROSCOPY/HOLMIUM LASER/STENT EXCHANGE (Right) - 30 MINUTES as a surgical intervention.  The patient's history has been reviewed, patient examined, no change in status, stable for surgery.  I have reviewed the patient's chart and labs.  Questions were answered to the patient's satisfaction.     Remi Haggard

## 2022-02-09 NOTE — Anesthesia Procedure Notes (Signed)
Procedure Name: LMA Insertion Date/Time: 02/09/2022 12:07 PM  Performed by: Annye Asa, MDPatient Re-evaluated:Patient Re-evaluated prior to induction Oxygen Delivery Method: Circle system utilized Preoxygenation: Pre-oxygenation with 100% oxygen LMA: LMA inserted LMA Size: 4.0 Number of attempts: 1 Placement Confirmation: positive ETCO2 and breath sounds checked- equal and bilateral Tube secured with: Tape Dental Injury: Teeth and Oropharynx as per pre-operative assessment

## 2022-02-09 NOTE — Addendum Note (Signed)
Addendum  created 02/09/22 1244 by Annye Asa, MD   Clinical Note Signed

## 2022-02-09 NOTE — Anesthesia Postprocedure Evaluation (Addendum)
Anesthesia Post Note  Patient: Curtis [name]  Procedure(s) Performed: CYSTOSCOPY/URETEROSCOPY/STENT EXCHANGE (Right: Penis)     Patient location during evaluation: PACU Anesthesia Type: General Level of consciousness: awake and alert, patient cooperative and oriented Pain management: pain level controlled Vital Signs Assessment: post-procedure vital signs reviewed and stable Respiratory status: spontaneous breathing, nonlabored ventilation and respiratory function stable Cardiovascular status: blood pressure returned to baseline and stable Postop Assessment: no apparent nausea or vomiting and adequate PO intake Anesthetic complications: no   No notable events documented.  Last Vitals:  Vitals:   02/09/22 1315 02/09/22 1330  BP: (!) 127/99   Pulse: 64 64  Resp: 16 12  Temp:  36.4 C  SpO2: 100% 100%    Last Pain:  Vitals:   02/09/22 1330  TempSrc:   PainSc: 0-No pain                 Lawernce Earll,E. Navya Timmons

## 2022-02-09 NOTE — Op Note (Signed)
Preoperative diagnosis:  1.  Distal ureteral calculus  Postoperative diagnosis: 1.  Passed ureteral calculus  Procedure(s): 1.  Cystoscopy, right retrograde pyelogram with intraoperative interpretation, right ureteroscopy, right JJ stent exchange  Surgeon: Dr. Harold Barban  Anesthesia: General  Complications: None  EBL: Minimal  Specimens: None  Disposition of specimens: Not applicable  Intraoperative findings: Tight right ureteral orifice easily dilated with passage of the ureteroscope.  No evidence of remaining stone on retrograde and ureteroscopy to the extent of the scope.  6 [place] by 26 cm Percuflex plus soft Contour stent exchanged with tether to the urethral meatus.  Indication: Patient is a 66 year old male presented with right ureteral obstruction from a 3 mm right distal ureteral calculus with associated urosepsis.  Underwent urgent placement of stent proximally 1 month ago.  Presents at this time undergo cystoscopy right ureteroscopy and possible laser lithotripsy and stone extraction.  Description of procedure:  After obtaining informed consent for the patient was taken the major cystoscopy suite placed under general anesthesia.  Placed in the dorsolithotomy position genitalia prepped and draped in usual sterile fashion.  Proper pause and timeout was performed for site of procedure.  108 French cystoscope was advanced in the bladder urethra and prostate appeared grossly normal.  There were no mucosal lesions.  Right JJ stent was identified.  Utilizing alligator grasper this was removed.  Cystoscope was advanced under direct vision back into the bladder.  Retrograde pyelogram with 5 French open tip catheter revealed no evidence of obvious filling defect along the course of the right ureter.  A sensor wire was passed up the right ureter to the renal pelvis where it was coiled.  The scope was removed.  6.4 French semirigid ureteroscope was advanced over the guidewire and  manipulated inside the right ureteral orifice.  There was some narrowing in the right distal ureter however with passage of the scope over the wire this was easily dilated.  No stone was encountered.  I advanced this up along the length of the scope and no stone was seen.  The ureteroscope was withdrawn atraumatically leaving the guidewire in place.  Because of the tight ureteral orifice I elected to replace the stent.  6 [place] by 26 cm Percuflex plus soft Contour stent was placed after back loading the wire through the cystoscope.  Wire was removed leaving a proximal coil in the renal pelvis and distal coil in the bladder.  There is good flow of urine through and around the stent noted.  Bladder was emptied and procedure was terminated.  A nylon suture was left on the distal end of the stent and left protruding to the urethral meatus to facilitate future removal in the office.  He was awakened from anesthesia and taken back to recovery room in stable condition.  No immediate complication from the procedure.

## 2022-02-09 NOTE — Anesthesia Preprocedure Evaluation (Addendum)
Anesthesia Evaluation  Patient identified by MRN, date of birth, ID band Patient awake    Reviewed: Allergy & Precautions, NPO status , Patient's Chart, lab work & pertinent test results  History of Anesthesia Complications Negative for: history of anesthetic complications  Airway Mallampati: II  TM Distance: >3 FB Neck ROM: Full    Dental  (+) Dental Advisory Given, Poor Dentition, Missing, Chipped   Pulmonary Current Smoker and Patient abstained from smoking.,    breath sounds clear to auscultation       Cardiovascular negative cardio ROS   Rhythm:Regular Rate:Normal  12/2021 ECHO: EF 45-50%, mildly decreased LVF, apical hypokinesis, mild LVH, mildly reduced RVF, no significant valvular abnormalities   Neuro/Psych negative neurological ROS     GI/Hepatic Neg liver ROS, Rectosigmoid cancer   Endo/Other  negative endocrine ROS  Renal/GU Renal InsufficiencyRenal diseaseStones: recent urosepsis     Musculoskeletal   Abdominal   Peds  Hematology negative hematology ROS (+)   Anesthesia Other Findings   Reproductive/Obstetrics                            Anesthesia Physical Anesthesia Plan  ASA: 2  Anesthesia Plan: General   Post-op Pain Management: Tylenol PO (pre-op)*   Induction: Intravenous  PONV Risk Score and Plan: 1 and Ondansetron and Dexamethasone  Airway Management Planned: LMA  Additional Equipment: None  Intra-op Plan:   Post-operative Plan:   Informed Consent: I have reviewed the patients History and Physical, chart, labs and discussed the procedure including the risks, benefits and alternatives for the proposed anesthesia with the patient or authorized representative who has indicated his/her understanding and acceptance.     Dental advisory given  Plan Discussed with: CRNA and Surgeon  Anesthesia Plan Comments:        Anesthesia Quick Evaluation

## 2022-02-10 ENCOUNTER — Encounter (HOSPITAL_COMMUNITY): Payer: Self-pay | Admitting: Urology

## 2022-02-12 ENCOUNTER — Ambulatory Visit (HOSPITAL_COMMUNITY)
Admission: RE | Admit: 2022-02-12 | Discharge: 2022-02-12 | Disposition: A | Discharge status: Home / Self Care | Payer: Self-pay | Source: Ambulatory Visit | Attending: Nurse Practitioner | Admitting: Nurse Practitioner

## 2022-02-12 DIAGNOSIS — L24B3 Irritant contact dermatitis related to fecal or urinary stoma or fistula: Secondary | ICD-10-CM | POA: Insufficient documentation

## 2022-02-12 DIAGNOSIS — Z432 Encounter for attention to ileostomy: Secondary | ICD-10-CM | POA: Insufficient documentation

## 2022-02-12 DIAGNOSIS — C19 Malignant neoplasm of rectosigmoid junction: Secondary | ICD-10-CM | POA: Insufficient documentation

## 2022-02-12 NOTE — Progress Notes (Signed)
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   Skin:  Positive for color change.       Peristomal skin pink  All other systems reviewed and are negative.  Vital signs:  BP (!) 143/76   Pulse 83   Temp 97.8 F (36.6 C)   Resp 17   SpO2 100%  Exam:  Physical Exam Vitals reviewed.  Constitutional:      Appearance: Normal appearance.  Abdominal:     Palpations: Abdomen is soft.  Skin:    Findings: Erythema present.     Comments: peristomal skin pink  Neurological:     Mental Status: He is alert.  Psychiatric:        Mood and Affect: Mood normal.     Stoma type/location:  LUQ ileostomy Stomal assessment/size:  1" pink and moist Peristomal assessment:  intact, pink Treatment options for stomal/peristomal skin: stoma powder, skin prep, barrier ring, convex pouch and belt.  Output: soft brown stool Ostomy pouching: 1pc. Convex pouch Education provided:  Ongoing issues with supplies.  Given some sample pouches today.     Impression/dx  Contact dermatitis, improved Ileostomy Discussion  See above Plan  See back as needed.     Visit time: 45 minutes.   Maple Hudson FNP-BC

## 2022-02-15 ENCOUNTER — Other Ambulatory Visit: Payer: Self-pay

## 2022-02-16 ENCOUNTER — Encounter (HOSPITAL_COMMUNITY): Payer: Self-pay | Admitting: Nurse Practitioner

## 2022-02-23 ENCOUNTER — Other Ambulatory Visit: Payer: Self-pay

## 2022-02-24 ENCOUNTER — Encounter: Payer: Self-pay | Admitting: Vascular Surgery

## 2022-02-24 NOTE — Telephone Encounter (Signed)
Call pt to schedule no answer left VM

## 2022-03-02 ENCOUNTER — Other Ambulatory Visit (HOSPITAL_BASED_OUTPATIENT_CLINIC_OR_DEPARTMENT_OTHER): Payer: Self-pay | Admitting: Hematology and Oncology

## 2022-03-02 DIAGNOSIS — C20 Malignant neoplasm of rectum: Secondary | ICD-10-CM

## 2022-03-03 ENCOUNTER — Inpatient Hospital Stay: Payer: Self-pay | Attending: Hematology and Oncology

## 2022-03-03 ENCOUNTER — Inpatient Hospital Stay (HOSPITAL_BASED_OUTPATIENT_CLINIC_OR_DEPARTMENT_OTHER): Payer: Self-pay | Admitting: Hematology and Oncology

## 2022-03-03 ENCOUNTER — Other Ambulatory Visit: Payer: Self-pay

## 2022-03-03 ENCOUNTER — Inpatient Hospital Stay: Payer: Self-pay

## 2022-03-03 VITALS — BP 148/57 | HR 57 | Temp 97.5°F | Resp 18 | Ht 70.0 in | Wt 181.8 lb

## 2022-03-03 DIAGNOSIS — Z803 Family history of malignant neoplasm of breast: Secondary | ICD-10-CM | POA: Insufficient documentation

## 2022-03-03 DIAGNOSIS — Z95828 Presence of other vascular implants and grafts: Secondary | ICD-10-CM

## 2022-03-03 DIAGNOSIS — R109 Unspecified abdominal pain: Secondary | ICD-10-CM | POA: Insufficient documentation

## 2022-03-03 DIAGNOSIS — Z87442 Personal history of urinary calculi: Secondary | ICD-10-CM | POA: Insufficient documentation

## 2022-03-03 DIAGNOSIS — R634 Abnormal weight loss: Secondary | ICD-10-CM | POA: Insufficient documentation

## 2022-03-03 DIAGNOSIS — D649 Anemia, unspecified: Secondary | ICD-10-CM | POA: Insufficient documentation

## 2022-03-03 DIAGNOSIS — C20 Malignant neoplasm of rectum: Secondary | ICD-10-CM

## 2022-03-03 DIAGNOSIS — Z79899 Other long term (current) drug therapy: Secondary | ICD-10-CM | POA: Insufficient documentation

## 2022-03-03 DIAGNOSIS — Z8249 Family history of ischemic heart disease and other diseases of the circulatory system: Secondary | ICD-10-CM | POA: Insufficient documentation

## 2022-03-03 DIAGNOSIS — N179 Acute kidney failure, unspecified: Secondary | ICD-10-CM | POA: Insufficient documentation

## 2022-03-03 DIAGNOSIS — Z881 Allergy status to other antibiotic agents status: Secondary | ICD-10-CM | POA: Insufficient documentation

## 2022-03-03 DIAGNOSIS — D696 Thrombocytopenia, unspecified: Secondary | ICD-10-CM | POA: Insufficient documentation

## 2022-03-03 DIAGNOSIS — E876 Hypokalemia: Secondary | ICD-10-CM | POA: Insufficient documentation

## 2022-03-03 DIAGNOSIS — F17210 Nicotine dependence, cigarettes, uncomplicated: Secondary | ICD-10-CM | POA: Insufficient documentation

## 2022-03-03 LAB — CMP (CANCER CENTER ONLY)
ALT: 15 U/L (ref 0–44)
AST: 19 U/L (ref 15–41)
Albumin: 4.2 g/dL (ref 3.5–5.0)
Alkaline Phosphatase: 88 U/L (ref 38–126)
Anion gap: 4 — ABNORMAL LOW (ref 5–15)
BUN: 24 mg/dL — ABNORMAL HIGH (ref 8–23)
CO2: 25 mmol/L (ref 22–32)
Calcium: 9.1 mg/dL (ref 8.9–10.3)
Chloride: 107 mmol/L (ref 98–111)
Creatinine: 1.31 mg/dL — ABNORMAL HIGH (ref 0.61–1.24)
GFR, Estimated: >60 mL/min (ref >60)
Glucose, Bld: 111 mg/dL — ABNORMAL HIGH (ref 70–99)
Potassium: 4.1 mmol/L (ref 3.5–5.1)
Sodium: 136 mmol/L (ref 135–145)
Total Bilirubin: 0.4 mg/dL (ref 0.3–1.2)
Total Protein: 7.6 g/dL (ref 6.5–8.1)

## 2022-03-03 LAB — CBC WITH DIFFERENTIAL (CANCER CENTER ONLY)
Abs Immature Granulocytes: 0 10*3/uL (ref 0.00–0.07)
Basophils Absolute: 0 10*3/uL (ref 0.0–0.1)
Basophils Relative: 1 %
Eosinophils Absolute: 0.3 10*3/uL (ref 0.0–0.5)
Eosinophils Relative: 8 %
HCT: 30.4 % — ABNORMAL LOW (ref 39.0–52.0)
Hemoglobin: 10.3 g/dL — ABNORMAL LOW (ref 13.0–17.0)
Immature Granulocytes: 0 %
Lymphocytes Relative: 20 %
Lymphs Abs: 0.7 10*3/uL (ref 0.7–4.0)
MCH: 29.9 pg (ref 26.0–34.0)
MCHC: 33.9 g/dL (ref 30.0–36.0)
MCV: 88.1 fL (ref 80.0–100.0)
Monocytes Absolute: 0.3 10*3/uL (ref 0.1–1.0)
Monocytes Relative: 8 %
Neutro Abs: 2.3 10*3/uL (ref 1.7–7.7)
Neutrophils Relative %: 63 %
Platelet Count: 226 10*3/uL (ref 150–400)
RBC: 3.45 MIL/uL — ABNORMAL LOW (ref 4.22–5.81)
RDW: 15 % (ref 11.5–15.5)
WBC Count: 3.7 10*3/uL — ABNORMAL LOW (ref 4.0–10.5)
nRBC: 0 % (ref 0.0–0.2)

## 2022-03-03 LAB — CEA (IN HOUSE-CHCC): CEA (CHCC-In House): 1.5 ng/mL (ref 0.00–5.00)

## 2022-03-03 MED ORDER — HEPARIN SOD (PORK) LOCK FLUSH 100 UNIT/ML IV SOLN
500.0000 [IU] | Freq: Once | INTRAVENOUS | Status: AC
  Administered 2022-03-03: 500 [IU]

## 2022-03-03 MED ORDER — SODIUM CHLORIDE 0.9% FLUSH
10.0000 mL | Freq: Once | INTRAVENOUS | Status: AC
  Administered 2022-03-03: 10 mL

## 2022-03-03 NOTE — Progress Notes (Signed)
[place] [phone]   [phone]) [phone]  PROGRESS NOTE  Patient Care Team: Gildardo Pounds, NP as PCP - General (Nurse Practitioner) Orson Slick, MD as Consulting Physician (Medical Oncology) Royston Bake, RN as Oncology Nurse Navigator (Oncology)  Hematological/Oncological History # Rectal Adenocarcinoma, T4N2M0. Stage IIIc, In Remission 01/16/2021: presented to the emergency department with abdominal pain. CT abdomen/pelvis showed concern for mass lesion with perforation and surrounding adenopathy. 01/19/2021: repeat CT scan showed worsening inflammatory changes around the complex rectal process 01/21/2021: flexible sigmoidoscopy performed, lesion at 12 cm from the anal verge.  biopsy showed concern for adenocarcinoma. 01/21/2021: patient underwent a laparoscopic assisted loop colostomy with Dr. Georgette Dover. 02/09/2021: establish care with Dr. Lorenso Courier  03/05/2021: Cycle 1 Day 1 of neoadjuvant FOLFOX 03/18/2021: Cycle 2 Day 1 of neoadjuvant FOLFOX 04/02/2021: Cycle 3 Day 1 of neoadjuvant FOLFOX 04/15/2021: Cycle 4 Day 1 of neoadjuvant FOLFOX 04/30/2021: Cycle 5 Day 1 of neoadjuvant FOLFOX 05/14/2021: Cycle 6 Day 1 of neoadjuvant FOLFOX 05/27/2021: Cycle 7 Day 1 of neoadjuvant FOLFOX 06/10/2021: Cycle 8 Day 1 of neoadjuvant FOLFOX delayed due to neutropenia.  06/22/2021: Cycle 8 Day 1 of neoadjuvant FOLFOX 07/13/2021: start of Chemoradiation with Xeloda 825 mg/m2 BID 08/21/2021: Last day of chemoradiation 09/09/2021: pre-surgical colonoscopy performed, no concerning abnormalities.  11/04/2021: LAR/DLI at [facility] with Dr. Hester Mates  Interval History:  Curtis [name] 66 y.o. male with medical history significant for rectal adenocarcinoma who presents for a follow up visit. The patient's last visit was on 09/10/2021. In the interim since last visit he was admitted for urosepsis in June 2023.   On exam today Curtis [name] reports reports he has recovered well from his  episode of urosepsis in June 2023.  He notes that all occurred when he woke up freezing 1 day and was continuing to shake under a blanket and sweater.  Went to work and was told to go home.  He became unresponsive and his son called 97.  He is unsure how he got to the emergency department without having an infected kidney stone which underwent urgent operation.  He lost some weight and was in the hospital for a prolonged stay.  He reports now he is back to 100%.  His weight is increasing and he is not currently in any pain.  He is eating well and his energy levels are stable.  He is very excited about his ostomy takedown on 03/17/2022.  He currently denies any nausea, vomiting, or diarrhea.  He denies any fevers, chills, night sweats, shortness of breath, chest pain, cough, neuropathy, mucositis or rash. A full 10 point ROS is listed below.  MEDICAL HISTORY:  Past Medical History:  Diagnosis Date   History of kidney stones    Rectosigmoid cancer ([place])     SURGICAL HISTORY: Past Surgical History:  Procedure Laterality Date   BIOPSY  01/21/2021   Procedure: BIOPSY;  Surgeon: Mauri Pole, MD;  Location: [place] [facility];  Service: Endoscopy;;   COLON RESECTION N/A 01/21/2021   Procedure: LAPAROSCOPIC ASSISTED LOOP COLOSTOMY;  Surgeon: Donnie Mesa, MD;  Location: [place];  Service: General;  Laterality: N/A;   COLON RESECTION SIGMOID  11/04/2021   CYSTOSCOPY W/ URETERAL STENT PLACEMENT Right 01/03/2022   Procedure: CYSTOSCOPY WITH  URETERAL STENT PLACEMENT;  Surgeon: Remi Haggard, MD;  Location: [place];  Service: Urology;  Laterality: Right;   CYSTOSCOPY/URETEROSCOPY/HOLMIUM LASER/STENT PLACEMENT Right 02/09/2022   Procedure: CYSTOSCOPY/URETEROSCOPY/STENT EXCHANGE;  Surgeon: Remi Haggard, MD;  Location: [facility];  Service: Urology;  Laterality: Right;  [place] N/A 01/21/2021   Procedure: FLEXIBLE SIGMOIDOSCOPY;  Surgeon: Mauri Pole, MD;  Location: [place]  [facility];  Service: Endoscopy;  Laterality: N/A;   IR IMAGING GUIDED PORT INSERTION  03/04/2021   SUBMUCOSAL TATTOO INJECTION  01/21/2021   Procedure: SUBMUCOSAL TATTOO INJECTION;  Surgeon: Mauri Pole, MD;  Location: [facility];  Service: Endoscopy;;    SOCIAL HISTORY: Social History   Socioeconomic History   Marital status: Divorced    Spouse name: Not on file   Number of children: Not on file   Years of education: Not on file   Highest education level: Not on file  Occupational History   Not on file  Tobacco Use   Smoking status: Some Days    Types: Cigarettes    Last attempt to quit: 01/05/2022    Years since quitting: 0.1   Smokeless tobacco: Never  Vaping Use   Vaping Use: Never used  Substance and Sexual Activity   Alcohol use: Never   Drug use: Never   Sexual activity: Never  Other Topics Concern   Not on file  Social History Narrative   Not on file   Social Determinants of Health   Financial Resource Strain: Low Risk  (02/09/2021)   Overall Financial Resource Strain (CARDIA)    Difficulty of Paying Living Expenses: Not very hard  Food Insecurity: No Food Insecurity (02/09/2021)   Hunger Vital Sign    Worried About Running Out of Food in the Last Year: Never true    Ran Out of Food in the Last Year: Never true  Transportation Needs: No Transportation Needs (02/09/2021)   PRAPARE - Hydrologist (Medical): No    Lack of Transportation (Non-Medical): No  Physical Activity: Not on file  Stress: Not on file  Social Connections: Not on file  Intimate Partner Violence: Not At Risk (02/26/2021)   Humiliation, Afraid, Rape, and Kick questionnaire    Fear of Current or Ex-Partner: No    Emotionally Abused: No    Physically Abused: No    Sexually Abused: No    FAMILY HISTORY: Family History  Problem Relation Age of Onset   Hypertension Mother    Hypertension Father    Breast cancer Sister     ALLERGIES:  is allergic to  cefazolin, leucovorin, and oxaliplatin.  MEDICATIONS:  Current Outpatient Medications  Medication Sig Dispense Refill   acetaminophen (TYLENOL) 500 MG tablet Take 1,000 mg by mouth every 8 (eight) hours as needed for headache or moderate pain.     acyclovir ointment (ZOVIRAX) 5 % Apply 1 Application topically every 3 (three) hours. (Patient not taking: Reported on 01/28/2022) 30 g 3   lidocaine-prilocaine (EMLA) cream Apply 1 application topically as needed. (Patient taking differently: Apply 1 application  topically as needed (port).) 30 g 0   loperamide (IMODIUM) 2 MG capsule Take 4 mg by mouth daily as needed for diarrhea or loose stools. Up to 6 /day     ondansetron (ZOFRAN) 8 MG tablet Take 1 tablet (8 mg total) by mouth every 8 (eight) hours as needed. (Patient not taking: Reported on 01/13/2022) 30 tablet 0   potassium chloride SA (KLOR-CON M20) 20 MEQ tablet Please take 1 tablet twice daily x5 days then 1 tablet oral daily. (Patient not taking: Reported on 03/03/2022) 30 tablet 0   traMADol (ULTRAM) 50 MG tablet Take 1 tablet (50 mg  total) by mouth every 6 (six) hours as needed. (Patient not taking: Reported on 03/03/2022) 15 tablet 0   No current facility-administered medications for this visit.    REVIEW OF SYSTEMS:   Constitutional: ( - ) fevers, ( - )  chills , ( - ) night sweats Eyes: ( - ) blurriness of vision, ( - ) double vision, ( - ) watery eyes Ears, nose, mouth, throat, and face: ( - ) mucositis, ( - ) sore throat Respiratory: ( - ) cough, ( - ) dyspnea, ( - ) wheezes Cardiovascular: ( - ) palpitation, ( - ) chest discomfort, ( - ) lower extremity swelling Gastrointestinal:  ( - ) nausea, ( - ) heartburn, ( - ) change in bowel habits Skin: ( - ) abnormal skin rashes Lymphatics: ( - ) new lymphadenopathy, ( - ) easy bruising Neurological: ( - ) numbness, ( - ) tingling, ( - ) new weaknesses Behavioral/Psych: ( - ) mood change, ( - ) new changes  All other systems were reviewed  with the patient and are negative.  PHYSICAL EXAMINATION: ECOG PERFORMANCE STATUS: 1 - Symptomatic but completely ambulatory  Vitals:   03/03/22 1041  BP: (!) 148/57  Pulse: (!) 57  Resp: 18  Temp: (!) 97.5 F (36.4 C)  SpO2: 100%    Filed Weights   03/03/22 1041  Weight: 181 lb 12.8 oz (82.5 kg)     GENERAL: Well-appearing middle-aged [name], alert, no distress and comfortable SKIN: skin color, texture, turgor are normal, no rashes or significant lesions THROAT: No mucositis. No overt bleeding or evidence of infection.  EYES: conjunctiva are pink and non-injected, sclera clear LUNGS: clear to auscultation and percussion with normal breathing effort HEART: regular rate & rhythm and no murmurs and no lower extremity edema ABDOMEN: soft, non-tender, non-distended, normal bowel sounds.  Ostomy clean dry and intact  Musculoskeletal: no cyanosis of digits and no clubbing  PSYCH: alert & oriented x 3, fluent speech NEURO: no focal motor/sensory deficits  LABORATORY DATA:  I have reviewed the data as listed    Latest Ref Rng & Units 03/03/2022   10:23 AM 01/27/2022    8:47 AM 01/11/2022    3:39 AM  CBC  WBC 4.0 - 10.5 K/uL 3.7  4.5  14.8   Hemoglobin 13.0 - 17.0 g/dL 10.3  10.1  8.6   Hematocrit 39.0 - 52.0 % 30.4  31.9  25.8   Platelets 150 - 400 K/uL 226  293  158        Latest Ref Rng & Units 03/03/2022   10:23 AM 01/27/2022    8:47 AM 01/11/2022    3:39 AM  CMP  Glucose 70 - 99 mg/dL 111  159  110   BUN 8 - 23 mg/dL '24  19  23   '$ Creatinine 0.61 - 1.24 mg/dL 1.31  1.45  1.16   Sodium 135 - 145 mmol/L 136  138  141   Potassium 3.5 - 5.1 mmol/L 4.1  4.6  3.3   Chloride 98 - 111 mmol/L 107  107  108   CO2 22 - 32 mmol/L '25  17  22   '$ Calcium 8.9 - 10.3 mg/dL 9.1  9.7  8.1   Total Protein 6.5 - 8.1 g/dL 7.6  7.7    Total Bilirubin 0.3 - 1.2 mg/dL 0.4  0.6    Alkaline Phos 38 - 126 U/L 88  170    AST 15 - 41 U/L 19  24    ALT 0 - 44 U/L 15  27      RADIOGRAPHIC  STUDIES: I have personally reviewed the radiological images as listed and agreed with the findings in the report: no evidence of metastatic diease in the chest. MRI abdomen does not show metastatic disease.   DG C-Arm 1-60 Min-No Report  Result Date: 02/09/2022 Fluoroscopy was utilized by the requesting physician.  No radiographic interpretation.    ASSESSMENT & PLAN Tavon Bianchini 66 y.o. male with medical history significant for rectal adenocarcinoma who presents for a follow up visit.   At this time the patient's findings are most consistent with localized rectal adenocarcinoma.  MRI of the pelvis confirmed this is a T4 N2 M0 stage IIIc cancer.  As such we will plan to proceed with neoadjuvant chemotherapy, followed by chemoradiation, followed by reevaluation for consideration of transabdominal resection.  The initial treatment of choice would be neoadjuvant FOLFOX chemotherapy for 8 cycles.  Once this is complete we will transition to chemoradiation with p.o. capecitabine therapy.  When these are complete we will restage for consideration of surgical resection.  # Rectal Adenocarcinoma, T4N2M0 Stage IIIc, in Remission.  --  staging with a CT scan of the chest and MRI abdomen/pelvis findings are consistent with localized disease.   --CEA modestly elevated at 17.44 on 02/09/2021 prior to start of therapy. --Given that this is adenocarcinoma of the rectum I would recommend proceeding with neoadjuvant chemotherapy, followed by chemoradiation, followed by definitive surgery. Case discussed at [facility] --patient completed 8 cycles of neoadjuvant FOLFOX --started chemoradiation therapy with Xeloda on 07/13/2021. Last treatment on 08/21/2021.  --last day of chemoradiation on 08/21/2021  Plan:   -- underwent LAR/DLI on 11/04/2021 at [facility] with Dr. Hester Mates.  Plan for possible ileostomy reversal on 03/17/2022 --plan for CT C/A/P q 6 months per NCCN recommendations. Last scan on 01/03/2022. Can plan for  next scan Dec 2023.  --continue clinic visits q 3 months with CEA q 3-6 months x 2 years, then q 6-12 months for a total of 5 years. --plan for colonoscopy x 1 year post-operatively.   --RTC in 12 weeks.   # AKI --Cr 1.31 today  --encourage hydration --repeat labs in 2 weeks.   # Hyperglyclemia-resolved --BG elevated, patient has no history of DM type II. Unclear why this occurred.  --BG 111 today, abnormal elevations occurred during chemotherapy treatment.  --off glipizide  --continue to monitor.   #Anemia- stable #Thrombocytopenia -improved --Hgb noted at 10.3, stable from prior --Plt 226 K today, stable from prior cycle. --likely secondary to active malignancy and modest iron deficiency. Some contribution from chemotherapy as well.  --continue to monitor  #Hypokalemia: --Today's potassium is 4.1 --OK to d/c oral potassium daily  #Supportive Care -- chemotherapy education complete -- port placed -- zofran '8mg'$  q8H PRN and compazine '10mg'$  PO q6H for nausea -- EMLA cream for port -- no pain medication required at this time.   Orders Placed This Encounter  Procedures   IR Removal Tun Access W/ Port W/O FL    Please schedule after 03/26/2022.    Standing Status:   Future    Standing Expiration Date:   03/04/2023    Order Specific Question:   Reason for exam:    Answer:   patient completed chemo and is in remission. Requesting port removal.    Order Specific Question:   Preferred Imaging Location?    Answer:   [facility]    All questions  were answered. The patient knows to call the clinic with any problems, questions or concerns.  I have spent a total of 30 minutes minutes of face-to-face and non-face-to-face time, preparing to see the patient, obtaining and/or reviewing separately obtained history, performing a medically appropriate examination, counseling and educating the patient, ordering medications,  communicating with other health care professionals,  documenting clinical information in the electronic health record, and care coordination.   Ledell Peoples, MD Department of Hematology/Oncology [place] at [facility] Phone: [phone] Pager: [phone] Email: [email or web address]   03/03/2022 11:22 AM  Hofheinz RD, Raynald Blend, Post S, Matzdorff A, Laechelt S, Edrick Oh, Mller L, Link H, Moehler M, Stacey Drain, Tove[place], [place], [place], Hipp M, Hartung G, Gencer D, [place], Gilman I, Hochhaus A. Chemoradiotherapy with capecitabine versus fluorouracil for locally advanced rectal cancer: a randomised, multicentre, non-inferiority, phase 3 trial. Lancet Oncol. 2012 Jun;13(6):579-88.

## 2022-03-04 ENCOUNTER — Other Ambulatory Visit: Payer: Self-pay

## 2022-03-05 ENCOUNTER — Encounter (HOSPITAL_COMMUNITY)
Admission: RE | Admit: 2022-03-05 | Discharge: 2022-03-05 | Disposition: A | Discharge status: Home / Self Care | Payer: Self-pay | Source: Ambulatory Visit | Attending: Nurse Practitioner | Admitting: Nurse Practitioner

## 2022-03-05 DIAGNOSIS — Z433 Encounter for attention to colostomy: Secondary | ICD-10-CM | POA: Insufficient documentation

## 2022-03-05 DIAGNOSIS — Z932 Ileostomy status: Secondary | ICD-10-CM

## 2022-03-05 NOTE — Progress Notes (Signed)
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   All other systems reviewed and are negative.  Vital signs:  BP 128/70 (BP Location: Right Arm)   Pulse 79   Temp 97.7 F (36.5 C) (Oral)   Resp 18   SpO2 99%  Exam:  Physical Exam Vitals reviewed.  Constitutional:      Appearance: Normal appearance.  Abdominal:     Palpations: Abdomen is soft.  Skin:    General: Skin is warm and dry.  Neurological:     Mental Status: He is alert and oriented to person, place, and time.  Psychiatric:        Mood and Affect: Mood normal.        Behavior: Behavior normal.     Stoma type/location:  LUQ ileostomy Stomal assessment/size:  pink and moist Peristomal assessment:  intact Treatment options for stomal/peristomal skin: barrier ring  1 piece convex pouch Output: soft brown stool Ostomy pouching: 1pc convex  Education provided:  Is eagerly anticipating reversal.  He is low on supplies and I am able to provide him with some sample pouches.     Impression/dx  ileostomy Discussion  reversal Plan  See back as needed.     Visit time: 35 minutes.   [name] FNP-BC

## 2022-03-05 NOTE — Discharge Instructions (Signed)
Reversal 03/17/22!! Call clinic with any issues before then

## 2022-04-21 ENCOUNTER — Ambulatory Visit (HOSPITAL_COMMUNITY)
Admission: RE | Admit: 2022-04-21 | Discharge: 2022-04-21 | Disposition: A | Discharge status: Home / Self Care | Payer: Self-pay | Source: Ambulatory Visit | Attending: Hematology and Oncology | Admitting: Hematology and Oncology

## 2022-04-21 DIAGNOSIS — Z95828 Presence of other vascular implants and grafts: Secondary | ICD-10-CM

## 2022-04-21 DIAGNOSIS — Z452 Encounter for adjustment and management of vascular access device: Secondary | ICD-10-CM | POA: Insufficient documentation

## 2022-04-21 HISTORY — PX: IR REMOVAL TUN ACCESS W/ PORT W/O FL MOD SED: IMG2290

## 2022-04-21 MED ORDER — LIDOCAINE-EPINEPHRINE 1 %-1:100000 IJ SOLN
INTRAMUSCULAR | Status: AC
  Filled 2022-04-21: qty 1

## 2022-04-21 MED ORDER — LIDOCAINE HCL 1 % IJ SOLN
INTRAMUSCULAR | Status: DC
Start: 2022-04-21 — End: 2022-04-21
  Filled 2022-04-21: qty 20

## 2022-04-21 NOTE — Procedures (Signed)
Pre Procedural Dx: Poor venous access; Completed chemotherapy Post Procedural Dx: Same  Successful removal of anterior chest wall port-a-cath.  EBL: Trace No immediate post procedural complications.   Ronny Bacon, MD Pager #: [phone]

## 2022-06-03 ENCOUNTER — Inpatient Hospital Stay (HOSPITAL_BASED_OUTPATIENT_CLINIC_OR_DEPARTMENT_OTHER): Payer: Self-pay | Admitting: Hematology and Oncology

## 2022-06-03 ENCOUNTER — Other Ambulatory Visit: Payer: Self-pay | Admitting: Hematology and Oncology

## 2022-06-03 ENCOUNTER — Other Ambulatory Visit: Payer: Self-pay

## 2022-06-03 ENCOUNTER — Telehealth: Payer: Self-pay | Admitting: Hematology and Oncology

## 2022-06-03 ENCOUNTER — Inpatient Hospital Stay: Payer: Self-pay | Attending: Hematology and Oncology

## 2022-06-03 VITALS — BP 166/76 | HR 64 | Temp 98.2°F | Resp 18 | Wt 192.9 lb

## 2022-06-03 DIAGNOSIS — Z79899 Other long term (current) drug therapy: Secondary | ICD-10-CM | POA: Insufficient documentation

## 2022-06-03 DIAGNOSIS — C20 Malignant neoplasm of rectum: Secondary | ICD-10-CM

## 2022-06-03 DIAGNOSIS — Z87442 Personal history of urinary calculi: Secondary | ICD-10-CM | POA: Insufficient documentation

## 2022-06-03 DIAGNOSIS — E876 Hypokalemia: Secondary | ICD-10-CM | POA: Insufficient documentation

## 2022-06-03 DIAGNOSIS — F17210 Nicotine dependence, cigarettes, uncomplicated: Secondary | ICD-10-CM | POA: Insufficient documentation

## 2022-06-03 DIAGNOSIS — R197 Diarrhea, unspecified: Secondary | ICD-10-CM | POA: Insufficient documentation

## 2022-06-03 DIAGNOSIS — Z8249 Family history of ischemic heart disease and other diseases of the circulatory system: Secondary | ICD-10-CM | POA: Insufficient documentation

## 2022-06-03 DIAGNOSIS — N179 Acute kidney failure, unspecified: Secondary | ICD-10-CM | POA: Insufficient documentation

## 2022-06-03 DIAGNOSIS — Z95828 Presence of other vascular implants and grafts: Secondary | ICD-10-CM

## 2022-06-03 DIAGNOSIS — R202 Paresthesia of skin: Secondary | ICD-10-CM | POA: Insufficient documentation

## 2022-06-03 DIAGNOSIS — D649 Anemia, unspecified: Secondary | ICD-10-CM | POA: Insufficient documentation

## 2022-06-03 DIAGNOSIS — Z803 Family history of malignant neoplasm of breast: Secondary | ICD-10-CM | POA: Insufficient documentation

## 2022-06-03 DIAGNOSIS — R200 Anesthesia of skin: Secondary | ICD-10-CM | POA: Insufficient documentation

## 2022-06-03 DIAGNOSIS — Z881 Allergy status to other antibiotic agents status: Secondary | ICD-10-CM | POA: Insufficient documentation

## 2022-06-03 LAB — CBC WITH DIFFERENTIAL (CANCER CENTER ONLY)
Abs Immature Granulocytes: 0 10*3/uL (ref 0.00–0.07)
Basophils Absolute: 0 10*3/uL (ref 0.0–0.1)
Basophils Relative: 1 %
Eosinophils Absolute: 0.4 10*3/uL (ref 0.0–0.5)
Eosinophils Relative: 7 %
HCT: 36.9 % — ABNORMAL LOW (ref 39.0–52.0)
Hemoglobin: 12.2 g/dL — ABNORMAL LOW (ref 13.0–17.0)
Immature Granulocytes: 0 %
Lymphocytes Relative: 19 %
Lymphs Abs: 1 10*3/uL (ref 0.7–4.0)
MCH: 29.2 pg (ref 26.0–34.0)
MCHC: 33.1 g/dL (ref 30.0–36.0)
MCV: 88.3 fL (ref 80.0–100.0)
Monocytes Absolute: 0.3 10*3/uL (ref 0.1–1.0)
Monocytes Relative: 7 %
Neutro Abs: 3.3 10*3/uL (ref 1.7–7.7)
Neutrophils Relative %: 66 %
Platelet Count: 229 10*3/uL (ref 150–400)
RBC: 4.18 MIL/uL — ABNORMAL LOW (ref 4.22–5.81)
RDW: 13.5 % (ref 11.5–15.5)
WBC Count: 5 10*3/uL (ref 4.0–10.5)
nRBC: 0 % (ref 0.0–0.2)

## 2022-06-03 LAB — CMP (CANCER CENTER ONLY)
ALT: 10 U/L (ref 0–44)
AST: 13 U/L — ABNORMAL LOW (ref 15–41)
Albumin: 4.1 g/dL (ref 3.5–5.0)
Alkaline Phosphatase: 80 U/L (ref 38–126)
Anion gap: 6 (ref 5–15)
BUN: 28 mg/dL — ABNORMAL HIGH (ref 8–23)
CO2: 24 mmol/L (ref 22–32)
Calcium: 9 mg/dL (ref 8.9–10.3)
Chloride: 108 mmol/L (ref 98–111)
Creatinine: 1.46 mg/dL — ABNORMAL HIGH (ref 0.61–1.24)
GFR, Estimated: 53 mL/min — ABNORMAL LOW (ref >60)
Glucose, Bld: 157 mg/dL — ABNORMAL HIGH (ref 70–99)
Potassium: 4.3 mmol/L (ref 3.5–5.1)
Sodium: 138 mmol/L (ref 135–145)
Total Bilirubin: 0.3 mg/dL (ref 0.3–1.2)
Total Protein: 7.1 g/dL (ref 6.5–8.1)

## 2022-06-03 NOTE — Telephone Encounter (Signed)
Per 11/9 los called and spoke to pt about appointment

## 2022-06-03 NOTE — Progress Notes (Signed)
[name] [phone]   [phone]  PROGRESS NOTE  Patient Care Team: Gildardo Pounds, NP as PCP - General (Nurse Practitioner) Orson Slick, MD as Consulting Physician (Medical Oncology) Royston Bake, RN as Oncology Nurse Navigator (Oncology)  Hematological/Oncological History # Rectal Adenocarcinoma, T4N2M0. Stage IIIc, In Remission 01/16/2021: presented to the emergency department with abdominal pain. CT abdomen/pelvis showed concern for mass lesion with perforation and surrounding adenopathy. 01/19/2021: repeat CT scan showed worsening inflammatory changes around the complex rectal process 01/21/2021: flexible sigmoidoscopy performed, lesion at 12 cm from the anal verge.  biopsy showed concern for adenocarcinoma. 01/21/2021: patient underwent a laparoscopic assisted loop colostomy with Dr. Georgette Dover. 02/09/2021: establish care with Dr. Lorenso Courier  03/05/2021: Cycle 1 Day 1 of neoadjuvant FOLFOX 03/18/2021: Cycle 2 Day 1 of neoadjuvant FOLFOX 04/02/2021: Cycle 3 Day 1 of neoadjuvant FOLFOX 04/15/2021: Cycle 4 Day 1 of neoadjuvant FOLFOX 04/30/2021: Cycle 5 Day 1 of neoadjuvant FOLFOX 05/14/2021: Cycle 6 Day 1 of neoadjuvant FOLFOX 05/27/2021: Cycle 7 Day 1 of neoadjuvant FOLFOX 06/10/2021: Cycle 8 Day 1 of neoadjuvant FOLFOX delayed due to neutropenia.  06/22/2021: Cycle 8 Day 1 of neoadjuvant FOLFOX 07/13/2021: start of Chemoradiation with Xeloda 825 mg/m2 BID 08/21/2021: Last day of chemoradiation 09/09/2021: pre-surgical colonoscopy performed, no concerning abnormalities.  11/04/2021: LAR/DLI at [facility] with Dr. Hester Mates 03/17/2022: Loop ileostomy takedown.   Interval History:  Curtis [name] 66 y.o. male with medical history significant for rectal adenocarcinoma who presents for a follow up visit. The patient's last visit was on 03/03/2022. In the interim since last visit he was admitted for urosepsis in June 2023.   On exam today Curtis [name] reports  he has been doing very well interim since her last visit.  He reports that he is a takedown procedure went quite well and the port is gone.  He is excited that he had to have his blood drawn "the old way" from his hand.  He reports that he does have some loose stools still and takes about 2 Imodium's per day.  He reports that it was rough the first few weeks but is steadily improving.  He is trying not to eat too much at one time because his appetite is so good.  His weight has increased by 11 pounds.  He notes that he is "back to 100%".  He is full of energy and is back at work.  He notes the only thing that reminds him he had chemotherapy was a little bit of numbness and tingling he has in his fingers and toes.  He currently denies any nausea, vomiting.  He denies any fevers, chills, night sweats, shortness of breath, chest pain, cough, neuropathy, mucositis or rash. A full 10 point ROS is listed below.  MEDICAL HISTORY:  Past Medical History:  Diagnosis Date   History of kidney stones    Rectosigmoid cancer ([place])     SURGICAL HISTORY: Past Surgical History:  Procedure Laterality Date   BIOPSY  01/21/2021   Procedure: BIOPSY;  Surgeon: Mauri Pole, MD;  Location: [facility];  Service: Endoscopy;;   COLON RESECTION N/A 01/21/2021   Procedure: LAPAROSCOPIC ASSISTED LOOP COLOSTOMY;  Surgeon: Donnie Mesa, MD;  Location: [place];  Service: General;  Laterality: N/A;   COLON RESECTION SIGMOID  11/04/2021   CYSTOSCOPY W/ URETERAL STENT PLACEMENT Right 01/03/2022   Procedure: CYSTOSCOPY WITH  URETERAL STENT PLACEMENT;  Surgeon: Remi Haggard, MD;  Location: [place];  Service: Urology;  Laterality: Right;   CYSTOSCOPY/URETEROSCOPY/HOLMIUM LASER/STENT PLACEMENT Right 02/09/2022   Procedure: CYSTOSCOPY/URETEROSCOPY/STENT EXCHANGE;  Surgeon: Remi Haggard, MD;  Location: [facility];  Service: Urology;  Laterality: Right;  [place] N/A 01/21/2021   Procedure:  FLEXIBLE SIGMOIDOSCOPY;  Surgeon: Mauri Pole, MD;  Location: [facility];  Service: Endoscopy;  Laterality: N/A;   IR IMAGING GUIDED PORT INSERTION  03/04/2021   IR REMOVAL TUN ACCESS W/ PORT W/O FL MOD SED  04/21/2022   SUBMUCOSAL TATTOO INJECTION  01/21/2021   Procedure: SUBMUCOSAL TATTOO INJECTION;  Surgeon: Mauri Pole, MD;  Location: [facility];  Service: Endoscopy;;    SOCIAL HISTORY: Social History   Socioeconomic History   Marital status: Divorced    Spouse name: Not on file   Number of children: Not on file   Years of education: Not on file   Highest education level: Not on file  Occupational History   Not on file  Tobacco Use   Smoking status: Some Days    Types: Cigarettes    Last attempt to quit: 01/05/2022    Years since quitting: 0.4   Smokeless tobacco: Never  Vaping Use   Vaping Use: Never used  Substance and Sexual Activity   Alcohol use: Never   Drug use: Never   Sexual activity: Never  Other Topics Concern   Not on file  Social History Narrative   Not on file   Social Determinants of Health   Financial Resource Strain: Low Risk  (02/09/2021)   Overall Financial Resource Strain (CARDIA)    Difficulty of Paying Living Expenses: Not very hard  Food Insecurity: No Food Insecurity (02/09/2021)   Hunger Vital Sign    Worried About Running Out of Food in the Last Year: Never true    Ran Out of Food in the Last Year: Never true  Transportation Needs: No Transportation Needs (02/09/2021)   PRAPARE - Hydrologist (Medical): No    Lack of Transportation (Non-Medical): No  Physical Activity: Not on file  Stress: Not on file  Social Connections: Not on file  Intimate Partner Violence: Not At Risk (02/26/2021)   Humiliation, Afraid, Rape, and Kick questionnaire    Fear of Current or Ex-Partner: No    Emotionally Abused: No    Physically Abused: No    Sexually Abused: No    FAMILY HISTORY: Family History   Problem Relation Age of Onset   Hypertension Mother    Hypertension Father    Breast cancer Sister     ALLERGIES:  is allergic to cefazolin, leucovorin, and oxaliplatin.  MEDICATIONS:  Current Outpatient Medications  Medication Sig Dispense Refill   acetaminophen (TYLENOL) 500 MG tablet Take 1,000 mg by mouth every 8 (eight) hours as needed for headache or moderate pain.     loperamide (IMODIUM) 2 MG capsule Take 4 mg by mouth daily as needed for diarrhea or loose stools. Up to 6 /day     No current facility-administered medications for this visit.    REVIEW OF SYSTEMS:   Constitutional: ( - ) fevers, ( - )  chills , ( - ) night sweats Eyes: ( - ) blurriness of vision, ( - ) double vision, ( - ) watery eyes Ears, nose, mouth, throat, and face: ( - ) mucositis, ( - ) sore throat Respiratory: ( - ) cough, ( - ) dyspnea, ( - ) wheezes Cardiovascular: ( - ) palpitation, ( - ) chest discomfort, ( - )  lower extremity swelling Gastrointestinal:  ( - ) nausea, ( - ) heartburn, ( - ) change in bowel habits Skin: ( - ) abnormal skin rashes Lymphatics: ( - ) new lymphadenopathy, ( - ) easy bruising Neurological: ( - ) numbness, ( - ) tingling, ( - ) new weaknesses Behavioral/Psych: ( - ) mood change, ( - ) new changes  All other systems were reviewed with the patient and are negative.  PHYSICAL EXAMINATION: ECOG PERFORMANCE STATUS: 1 - Symptomatic but completely ambulatory  Vitals:   06/03/22 0949  BP: (!) 166/76  Pulse: 64  Resp: 18  Temp: 98.2 F (36.8 C)  SpO2: 100%     Filed Weights   06/03/22 0949  Weight: 192 lb 14.4 oz (87.5 kg)      GENERAL: Well-appearing middle-aged [name], alert, no distress and comfortable SKIN: skin color, texture, turgor are normal, no rashes or significant lesions EYES: conjunctiva are pink and non-injected, sclera clear LUNGS: clear to auscultation and percussion with normal breathing effort HEART: regular rate & rhythm and no  murmurs and no lower extremity edema ABDOMEN: soft, non-tender, non-distended, normal bowel sounds.   Musculoskeletal: no cyanosis of digits and no clubbing  PSYCH: alert & oriented x 3, fluent speech NEURO: no focal motor/sensory deficits  LABORATORY DATA:  I have reviewed the data as listed    Latest Ref Rng & Units 06/03/2022    9:40 AM 03/03/2022   10:23 AM 01/27/2022    8:47 AM  CBC  WBC 4.0 - 10.5 K/uL 5.0  3.7  4.5   Hemoglobin 13.0 - 17.0 g/dL 12.2  10.3  10.1   Hematocrit 39.0 - 52.0 % 36.9  30.4  31.9   Platelets 150 - 400 K/uL 229  226  293        Latest Ref Rng & Units 06/03/2022    9:40 AM 03/03/2022   10:23 AM 01/27/2022    8:47 AM  CMP  Glucose 70 - 99 mg/dL 157  111  159   BUN 8 - 23 mg/dL '28  24  19   '$ Creatinine 0.61 - 1.24 mg/dL 1.46  1.31  1.45   Sodium 135 - 145 mmol/L 138  136  138   Potassium 3.5 - 5.1 mmol/L 4.3  4.1  4.6   Chloride 98 - 111 mmol/L 108  107  107   CO2 22 - 32 mmol/L '24  25  17   '$ Calcium 8.9 - 10.3 mg/dL 9.0  9.1  9.7   Total Protein 6.5 - 8.1 g/dL 7.1  7.6  7.7   Total Bilirubin 0.3 - 1.2 mg/dL 0.3  0.4  0.6   Alkaline Phos 38 - 126 U/L 80  88  170   AST 15 - 41 U/L '13  19  24   '$ ALT 0 - 44 U/L '10  15  27     '$ RADIOGRAPHIC STUDIES: I have personally reviewed the radiological images as listed and agreed with the findings in the report: no evidence of metastatic diease in the chest. MRI abdomen does not show metastatic disease.   No results found.  ASSESSMENT & PLAN Curtis [name] 66 y.o. male with medical history significant for rectal adenocarcinoma who presents for a follow up visit.   At this time the patient's findings are most consistent with localized rectal adenocarcinoma.  MRI of the pelvis confirmed this is a T4 N2 M0 stage IIIc cancer.  As such we will plan to proceed with neoadjuvant  chemotherapy, followed by chemoradiation, followed by reevaluation for consideration of transabdominal resection.  The initial treatment of choice  would be neoadjuvant FOLFOX chemotherapy for 8 cycles.  Once this is complete we will transition to chemoradiation with p.o. capecitabine therapy.  When these are complete we will restage for consideration of surgical resection.  # Rectal Adenocarcinoma, T4N2M0 Stage IIIc, in Remission.  --  staging with a CT scan of the chest and MRI abdomen/pelvis findings are consistent with localized disease.   --CEA modestly elevated at 17.44 on 02/09/2021 prior to start of therapy. --Given that this is adenocarcinoma of the rectum I would recommend proceeding with neoadjuvant chemotherapy, followed by chemoradiation, followed by definitive surgery. Case discussed at [facility] --patient completed 8 cycles of neoadjuvant FOLFOX --started chemoradiation therapy with Xeloda on 07/13/2021. Last treatment on 08/21/2021.  --last day of chemoradiation on 08/21/2021  Plan:   -- underwent LAR/DLI on 11/04/2021 at [facility] with Dr. Hester Mates.  Underwent ileostomy reversal on 03/17/2022 --plan for CT C/A/P q 6 months per NCCN recommendations. Last scan on 01/03/2022. Can plan for next scan Dec 2023.  --continue clinic visits q 3 months with CEA q 3-6 months x 2 years, then q 6-12 months for a total of 5 years. --plan for colonoscopy x 1 year post-operatively (April 2024) --RTC in 12 weeks.   # AKI --Cr 1.46 today, likely component of loose stools/diarrhea causing this. --encourage hydration --repeat labs in 2 weeks.   # Hyperglyclemia-resolved --BG elevated, patient has no history of DM type II. Unclear why this occurred.  --BG 111 today, abnormal elevations occurred during chemotherapy treatment.  --off glipizide  --continue to monitor.   #Anemia--improved #Thrombocytopenia--resolved.  --Hgb noted at 12.2, improved from prior --Plt 229 K today, stable from prior cycle. --continue to monitor  #Hypokalemia: --Today's potassium is 4.3 --OK to d/c oral potassium daily  #Supportive Care -- chemotherapy education  complete -- port removed on 04/21/2022 -- zofran '8mg'$  q8H PRN and compazine '10mg'$  PO q6H for nausea -- EMLA cream for port -- no pain medication required at this time.   No orders of the defined types were placed in this encounter.   All questions were answered. The patient knows to call the clinic with any problems, questions or concerns.  I have spent a total of 30 minutes minutes of face-to-face and non-face-to-face time, preparing to see the patient, obtaining and/or reviewing separately obtained history, performing a medically appropriate examination, counseling and educating the patient, ordering medications,  communicating with other health care professionals, documenting clinical information in the electronic health record, and care coordination.   Ledell Peoples, MD Department of Hematology/Oncology [place] [facility] Phone: [phone] Pager: [phone] Email: [email or web address]   06/03/2022 10:24 AM  Hofheinz RD, Raynald Blend, Post S, Matzdorff A, Laechelt S, Edrick Oh, Mller L, Link H, Moehler M, Stacey Drain, [place], [place], [place], Hipp M, Hartung G, Gencer D, [place], [place] I, Hochhaus A. Chemoradiotherapy with capecitabine versus fluorouracil for locally advanced rectal cancer: a randomised, multicentre, non-inferiority, phase 3 trial. Lancet Oncol. 2012 Jun;13(6):579-88.

## 2022-07-30 ENCOUNTER — Other Ambulatory Visit (HOSPITAL_COMMUNITY): Payer: Self-pay

## 2022-09-03 ENCOUNTER — Inpatient Hospital Stay (HOSPITAL_BASED_OUTPATIENT_CLINIC_OR_DEPARTMENT_OTHER): Payer: Self-pay | Admitting: Hematology and Oncology

## 2022-09-03 ENCOUNTER — Other Ambulatory Visit: Payer: Self-pay | Admitting: Hematology and Oncology

## 2022-09-03 ENCOUNTER — Telehealth: Payer: Self-pay | Admitting: Hematology and Oncology

## 2022-09-03 ENCOUNTER — Other Ambulatory Visit: Payer: Self-pay

## 2022-09-03 ENCOUNTER — Inpatient Hospital Stay: Payer: Self-pay | Attending: Hematology and Oncology

## 2022-09-03 VITALS — BP 177/82 | HR 56 | Temp 97.8°F | Resp 16 | Wt 207.2 lb

## 2022-09-03 DIAGNOSIS — C20 Malignant neoplasm of rectum: Secondary | ICD-10-CM | POA: Insufficient documentation

## 2022-09-03 DIAGNOSIS — F17210 Nicotine dependence, cigarettes, uncomplicated: Secondary | ICD-10-CM | POA: Insufficient documentation

## 2022-09-03 DIAGNOSIS — R109 Unspecified abdominal pain: Secondary | ICD-10-CM | POA: Insufficient documentation

## 2022-09-03 DIAGNOSIS — Z8249 Family history of ischemic heart disease and other diseases of the circulatory system: Secondary | ICD-10-CM | POA: Insufficient documentation

## 2022-09-03 DIAGNOSIS — E876 Hypokalemia: Secondary | ICD-10-CM | POA: Insufficient documentation

## 2022-09-03 DIAGNOSIS — R42 Dizziness and giddiness: Secondary | ICD-10-CM | POA: Insufficient documentation

## 2022-09-03 DIAGNOSIS — Z803 Family history of malignant neoplasm of breast: Secondary | ICD-10-CM | POA: Insufficient documentation

## 2022-09-03 DIAGNOSIS — R200 Anesthesia of skin: Secondary | ICD-10-CM | POA: Insufficient documentation

## 2022-09-03 DIAGNOSIS — Z87442 Personal history of urinary calculi: Secondary | ICD-10-CM | POA: Insufficient documentation

## 2022-09-03 DIAGNOSIS — Z79899 Other long term (current) drug therapy: Secondary | ICD-10-CM | POA: Insufficient documentation

## 2022-09-03 DIAGNOSIS — Z881 Allergy status to other antibiotic agents status: Secondary | ICD-10-CM | POA: Insufficient documentation

## 2022-09-03 LAB — CMP (CANCER CENTER ONLY)
ALT: 11 U/L (ref 0–44)
AST: 14 U/L — ABNORMAL LOW (ref 15–41)
Albumin: 3.9 g/dL (ref 3.5–5.0)
Alkaline Phosphatase: 77 U/L (ref 38–126)
Anion gap: 5 (ref 5–15)
BUN: 20 mg/dL (ref 8–23)
CO2: 27 mmol/L (ref 22–32)
Calcium: 9.1 mg/dL (ref 8.9–10.3)
Chloride: 107 mmol/L (ref 98–111)
Creatinine: 1.32 mg/dL — ABNORMAL HIGH (ref 0.61–1.24)
GFR, Estimated: >60 mL/min (ref >60)
Glucose, Bld: 110 mg/dL — ABNORMAL HIGH (ref 70–99)
Potassium: 3.9 mmol/L (ref 3.5–5.1)
Sodium: 139 mmol/L (ref 135–145)
Total Bilirubin: 0.4 mg/dL (ref 0.3–1.2)
Total Protein: 7.1 g/dL (ref 6.5–8.1)

## 2022-09-03 LAB — CBC WITH DIFFERENTIAL (CANCER CENTER ONLY)
Abs Immature Granulocytes: 0.01 10*3/uL (ref 0.00–0.07)
Basophils Absolute: 0 10*3/uL (ref 0.0–0.1)
Basophils Relative: 1 %
Eosinophils Absolute: 0.3 10*3/uL (ref 0.0–0.5)
Eosinophils Relative: 6 %
HCT: 39.2 % (ref 39.0–52.0)
Hemoglobin: 13.3 g/dL (ref 13.0–17.0)
Immature Granulocytes: 0 %
Lymphocytes Relative: 21 %
Lymphs Abs: 1.2 10*3/uL (ref 0.7–4.0)
MCH: 29.4 pg (ref 26.0–34.0)
MCHC: 33.9 g/dL (ref 30.0–36.0)
MCV: 86.7 fL (ref 80.0–100.0)
Monocytes Absolute: 0.4 10*3/uL (ref 0.1–1.0)
Monocytes Relative: 7 %
Neutro Abs: 3.5 10*3/uL (ref 1.7–7.7)
Neutrophils Relative %: 65 %
Platelet Count: 211 10*3/uL (ref 150–400)
RBC: 4.52 MIL/uL (ref 4.22–5.81)
RDW: 13.7 % (ref 11.5–15.5)
WBC Count: 5.4 10*3/uL (ref 4.0–10.5)
nRBC: 0 % (ref 0.0–0.2)

## 2022-09-03 LAB — MAGNESIUM: Magnesium: 1.6 mg/dL — ABNORMAL LOW (ref 1.7–2.4)

## 2022-09-03 NOTE — Telephone Encounter (Signed)
Per 09/03/22 LOS scheduled pt for Lab+ office visit pt aware and confirmed

## 2022-09-03 NOTE — Progress Notes (Signed)
[place] Telephone:([phone]) [phone]   Fax:([phone]  PROGRESS NOTE  Patient Care Team: Gildardo Pounds, NP as PCP - General (Nurse Practitioner) Orson Slick, MD as Consulting Physician (Medical Oncology) Royston Bake, RN as Oncology Nurse Navigator (Oncology)  Hematological/Oncological History # Rectal Adenocarcinoma, T4N2M0. Stage IIIc, In Remission 01/16/2021: presented to the emergency department with abdominal pain. CT abdomen/pelvis showed concern for mass lesion with perforation and surrounding adenopathy. 01/19/2021: repeat CT scan showed worsening inflammatory changes around the complex rectal process 01/21/2021: flexible sigmoidoscopy performed, lesion at 12 cm from the anal verge.  biopsy showed concern for adenocarcinoma. 01/21/2021: patient underwent a laparoscopic assisted loop colostomy with Dr. Georgette Dover. 02/09/2021: establish care with Dr. Lorenso Courier  03/05/2021: Cycle 1 Day 1 of neoadjuvant FOLFOX 03/18/2021: Cycle 2 Day 1 of neoadjuvant FOLFOX 04/02/2021: Cycle 3 Day 1 of neoadjuvant FOLFOX 04/15/2021: Cycle 4 Day 1 of neoadjuvant FOLFOX 04/30/2021: Cycle 5 Day 1 of neoadjuvant FOLFOX 05/14/2021: Cycle 6 Day 1 of neoadjuvant FOLFOX 05/27/2021: Cycle 7 Day 1 of neoadjuvant FOLFOX 06/10/2021: Cycle 8 Day 1 of neoadjuvant FOLFOX delayed due to neutropenia.  06/22/2021: Cycle 8 Day 1 of neoadjuvant FOLFOX 07/13/2021: start of Chemoradiation with Xeloda 825 mg/m2 BID 08/21/2021: Last day of chemoradiation 09/09/2021: pre-surgical colonoscopy performed, no concerning abnormalities.  11/04/2021: LAR[facility]DLI at [facility] with Dr. Hester Mates 03/17/2022: Loop ileostomy takedown.   Interval History:  Curtis [name] 67 y.o. male with medical history significant for rectal adenocarcinoma who presents for a follow up visit. The patient's last visit was on 06/03/2022. In the interim since last visit he has had no major changes in his health.  On exam today [name] reports he  is back to work "110%".  He reports that he semiretired approximately 2 years ago but still works quite a tough schedule.  He reports he will try to work shorter days in the future.  He notes his energy is good and he is eating too well.  He is up to 207 pounds, up from 181 pounds 6 months ago.  He reports that he no longer has a burning sensation where his ostomy site was.  He reports his bowels are moving "excellent".  He reports that he does have upcoming visit with surgery in March 2024.  He is longer having episodes of dizziness and he thinks his only residual symptom is occasional numbness of his foot and the tips of his fingers.  He currently denies any nausea, vomiting.  He denies any fevers, chills, night sweats, shortness of breath, chest pain, cough, neuropathy, mucositis or rash. A full 10 point ROS is listed below.  MEDICAL HISTORY:  Past Medical History:  Diagnosis Date   History of kidney stones    Rectosigmoid cancer ([place])     SURGICAL HISTORY: Past Surgical History:  Procedure Laterality Date   BIOPSY  01/21/2021   Procedure: BIOPSY;  Surgeon: Mauri Pole, MD;  Location: [place];  Service: Endoscopy;;   COLON RESECTION N/A 01/21/2021   Procedure: LAPAROSCOPIC ASSISTED LOOP COLOSTOMY;  Surgeon: Donnie Mesa, MD;  Location: [place];  Service: General;  Laterality: N/A;   COLON RESECTION SIGMOID  11/04/2021   CYSTOSCOPY W/ URETERAL STENT PLACEMENT Right 01/03/2022   Procedure: CYSTOSCOPY WITH  URETERAL STENT PLACEMENT;  Surgeon: Remi Haggard, MD;  Location: [facility];  Service: Urology;  Laterality: Right;   CYSTOSCOPY/URETEROSCOPY/HOLMIUM LASER/STENT PLACEMENT Right 02/09/2022   Procedure: CYSTOSCOPY/URETEROSCOPY/STENT EXCHANGE;  Surgeon: Remi Haggard, MD;  Location: [facility];  Service: Urology;  Laterality: Right;  [place] N/A 01/21/2021   Procedure: FLEXIBLE SIGMOIDOSCOPY;  Surgeon: Mauri Pole, MD;  Location: [facility];   Service: Endoscopy;  Laterality: N/A;   IR IMAGING GUIDED PORT INSERTION  03/04/2021   IR REMOVAL TUN ACCESS W/ PORT W/O FL MOD SED  04/21/2022   SUBMUCOSAL TATTOO INJECTION  01/21/2021   Procedure: SUBMUCOSAL TATTOO INJECTION;  Surgeon: Mauri Pole, MD;  Location: [facility];  Service: Endoscopy;;    SOCIAL HISTORY: Social History   Socioeconomic History   Marital status: Divorced    Spouse name: Not on file   Number of children: Not on file   Years of education: Not on file   Highest education level: Not on file  Occupational History   Not on file  Tobacco Use   Smoking status: Some Days    Types: Cigarettes    Last attempt to quit: 01/05/2022    Years since quitting: 0.6   Smokeless tobacco: Never  Vaping Use   Vaping Use: Never used  Substance and Sexual Activity   Alcohol use: Never   Drug use: Never   Sexual activity: Never  Other Topics Concern   Not on file  Social History Narrative   Not on file   Social Determinants of Health   Financial Resource Strain: Low Risk  (02/09/2021)   Overall Financial Resource Strain (CARDIA)    Difficulty of Paying Living Expenses: Not very hard  Food Insecurity: No Food Insecurity (02/09/2021)   Hunger Vital Sign    Worried About Running Out of Food in the Last Year: Never true    Ran Out of Food in the Last Year: Never true  Transportation Needs: No Transportation Needs (02/09/2021)   PRAPARE - Hydrologist (Medical): No    Lack of Transportation (Non-Medical): No  Physical Activity: Not on file  Stress: Not on file  Social Connections: Not on file  Intimate Partner Violence: Not At Risk (02/26/2021)   Humiliation, Afraid, Rape, and Kick questionnaire    Fear of Current or Ex-Partner: No    Emotionally Abused: No    Physically Abused: No    Sexually Abused: No    FAMILY HISTORY: Family History  Problem Relation Age of Onset   Hypertension Mother    Hypertension Father    Breast  cancer Sister     ALLERGIES:  is allergic to cefazolin, leucovorin, and oxaliplatin.  MEDICATIONS:  Current Outpatient Medications  Medication Sig Dispense Refill   acetaminophen (TYLENOL) 500 MG tablet Take 1,000 mg by mouth every 8 (eight) hours as needed for headache or moderate pain.     loperamide (IMODIUM) 2 MG capsule Take 4 mg by mouth daily as needed for diarrhea or loose stools. Up to 6 /day     No current facility-administered medications for this visit.    REVIEW OF SYSTEMS:   Constitutional: ( - ) fevers, ( - )  chills , ( - ) night sweats Eyes: ( - ) blurriness of vision, ( - ) double vision, ( - ) watery eyes Ears, nose, mouth, throat, and face: ( - ) mucositis, ( - ) sore throat Respiratory: ( - ) cough, ( - ) dyspnea, ( - ) wheezes Cardiovascular: ( - ) palpitation, ( - ) chest discomfort, ( - ) lower extremity swelling Gastrointestinal:  ( - ) nausea, ( - ) heartburn, ( - ) change in bowel habits  Skin: ( - ) abnormal skin rashes Lymphatics: ( - ) new lymphadenopathy, ( - ) easy bruising Neurological: ( - ) numbness, ( - ) tingling, ( - ) new weaknesses Behavioral/Psych: ( - ) mood change, ( - ) new changes  All other systems were reviewed with the patient and are negative.  PHYSICAL EXAMINATION: ECOG PERFORMANCE STATUS: 1 - Symptomatic but completely ambulatory  Vitals:   09/03/22 1008  BP: (!) 177/82  Pulse: (!) 56  Resp: 16  Temp: 97.8 F (36.6 C)  SpO2: 100%     Filed Weights   09/03/22 1008  Weight: 207 lb 3.2 oz (94 kg)      GENERAL: Well-appearing middle-aged [name], alert, no distress and comfortable SKIN: skin color, texture, turgor are normal, no rashes or significant lesions EYES: conjunctiva are pink and non-injected, sclera clear LUNGS: clear to auscultation and percussion with normal breathing effort HEART: regular rate & rhythm and no murmurs and no lower extremity edema ABDOMEN: soft, non-tender, non-distended, normal bowel  sounds.   Musculoskeletal: no cyanosis of digits and no clubbing  PSYCH: alert & oriented x 3, fluent speech NEURO: no focal motor/sensory deficits  LABORATORY DATA:  I have reviewed the data as listed    Latest Ref Rng & Units 09/03/2022    9:49 AM 06/03/2022    9:40 AM 03/03/2022   10:23 AM  CBC  WBC 4.0 - 10.5 K/uL 5.4  5.0  3.7   Hemoglobin 13.0 - 17.0 g/dL 13.3  12.2  10.3   Hematocrit 39.0 - 52.0 % 39.2  36.9  30.4   Platelets 150 - 400 K/uL 211  229  226        Latest Ref Rng & Units 09/03/2022    9:49 AM 06/03/2022    9:40 AM 03/03/2022   10:23 AM  CMP  Glucose 70 - 99 mg/dL 110  157  111   BUN 8 - 23 mg/dL 20  28  24   $ Creatinine 0.61 - 1.24 mg/dL 1.32  1.46  1.31   Sodium 135 - 145 mmol/L 139  138  136   Potassium 3.5 - 5.1 mmol/L 3.9  4.3  4.1   Chloride 98 - 111 mmol/L 107  108  107   CO2 22 - 32 mmol/L 27  24  25   $ Calcium 8.9 - 10.3 mg/dL 9.1  9.0  9.1   Total Protein 6.5 - 8.1 g/dL 7.1  7.1  7.6   Total Bilirubin 0.3 - 1.2 mg/dL 0.4  0.3  0.4   Alkaline Phos 38 - 126 U/L 77  80  88   AST 15 - 41 U/L 14  13  19   $ ALT 0 - 44 U/L 11  10  15     $ RADIOGRAPHIC STUDIES: I have personally reviewed the radiological images as listed and agreed with the findings in the report: no evidence of metastatic diease in the chest. MRI abdomen does not show metastatic disease.   No results found.  ASSESSMENT & PLAN Curtis [name] 67 y.o. male with medical history significant for rectal adenocarcinoma who presents for a follow up visit.   At this time the patient's findings are most consistent with localized rectal adenocarcinoma.  MRI of the pelvis confirmed this is a T4 N2 M0 stage IIIc cancer.  As such we will plan to proceed with neoadjuvant chemotherapy, followed by chemoradiation, followed by reevaluation for consideration of transabdominal resection.  The initial treatment of choice would  be neoadjuvant FOLFOX chemotherapy for 8 cycles.  Once this is complete we will transition  to chemoradiation with p.o. capecitabine therapy.  When these are complete we will restage for consideration of surgical resection.  # Rectal Adenocarcinoma, T4N2M0 Stage IIIc, in Remission.  --  staging with a CT scan of the chest and MRI abdomen/pelvis findings are consistent with localized disease.   --CEA modestly elevated at 17.44 on 02/09/2021 prior to start of therapy. --Given that this is adenocarcinoma of the rectum I would recommend proceeding with neoadjuvant chemotherapy, followed by chemoradiation, followed by definitive surgery. Case discussed at [facility] --patient completed 8 cycles of neoadjuvant FOLFOX --started chemoradiation therapy with Xeloda on 07/13/2021. Last treatment on 08/21/2021.  --last day of chemoradiation on 08/21/2021  Plan:   -- underwent LAR/DLI on 11/04/2021 at [facility] with Dr. Hester Mates.  Underwent ileostomy reversal on 03/17/2022 --plan for CT C/A/P q 6 months per NCCN recommendations. Last scan on 01/03/2022. Next CT scan is currently due.  --continue clinic visits q 3 months with CEA q 3-6 months x 2 years, then q 6-12 months for a total of 5 years. --plan for colonoscopy x 1 year post-operatively (April 2024). Notified GI department.  --RTC in 12 weeks.   # AKI --Cr 1.32 today, stable --encourage hydration  # Hyperglyclemia-resolved --BG elevated, patient has no history of DM type II. Unclear why this occurred.  --BG 110 today, abnormal elevations occurred during chemotherapy treatment.  --off glipizide  --continue to monitor.   #Anemia--resolved #Thrombocytopenia--resolved.  --Hgb noted at 13.3, improved from prior --Plt 211k --continue to monitor  #Hypokalemia: --Today's potassium is 3.9 --OK to d/c oral potassium daily  #Supportive Care -- chemotherapy education complete -- port removed on 04/21/2022 -- zofran 43m q8H PRN and compazine 128mPO q6H for nausea -- EMLA cream for port -- no pain medication required at this time.   Orders  Placed This Encounter  Procedures   CT CHEST ABDOMEN PELVIS W CONTRAST    Standing Status:   Future    Standing Expiration Date:   09/04/2023    Order Specific Question:   If indicated for the ordered procedure, I authorize the administration of contrast media per Radiology protocol    Answer:   Yes    Order Specific Question:   Does the patient have a contrast media/X-ray dye allergy?    Answer:   No    Order Specific Question:   [facility]    [facility]  Order Specific Question:   Is Oral Contrast requested for this exam?    Answer:   Yes, Per Radiology protocol    All questions were answered. The patient knows to call the clinic with any problems, questions or concerns.  I have spent a total of 30 minutes minutes of face-to-face and non-face-to-face time, preparing to see the patient, obtaining and/or reviewing separately obtained history, performing a medically appropriate examination, counseling and educating the patient, ordering medications,  communicating with other health care professionals, documenting clinical information in the electronic health record, and care coordination.   JoLedell PeoplesMD Department of Hematology/Oncology CoBeulaht [facility] Hospitalhone: [phone]: [phone]: [email or web address]   09/03/2022 11:28 AM  Hofheinz RD, WeRaynald BlendPost S, Matzdorff A, Laechelt S, HaEdrick OhMller L, Link H, Moehler M, KeStacey DrainGrBayou L'OurseKrBismarck, Hipp M, Hartung G, GeMagnoliaKiAlma  P, Burkholder I, Hochhaus A. Chemoradiotherapy with capecitabine versus fluorouracil for locally advanced rectal cancer: a randomised, multicentre, non-inferiority, phase 3 trial. Lancet Oncol. 2012 Jun;13(6):579-88.

## 2022-09-20 IMAGING — DX DG ABD PORTABLE 1V
1 series · 1 of 1 positions shown · non-contrast
Comparison: CT, 01/03/2022

CLINICAL DATA: NG tube placement.

EXAM:
PORTABLE ABDOMEN - 1 VIEW

[abdomen]
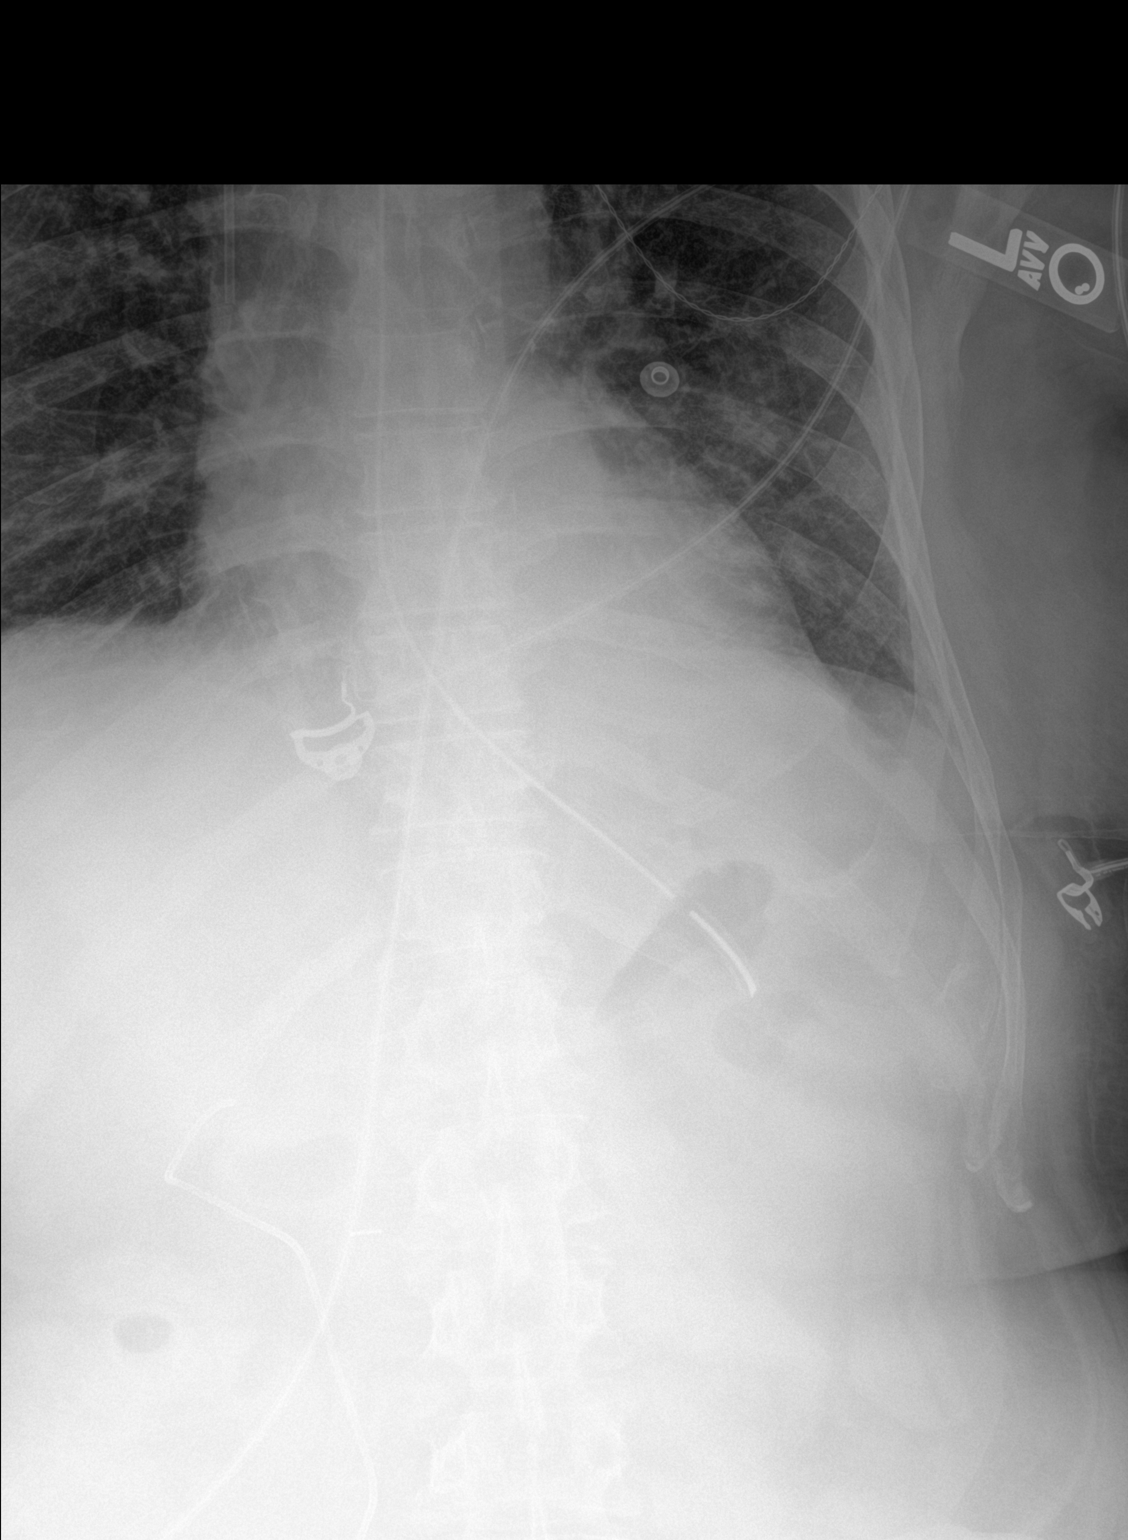

[1 of 1 positions shown; findings below may reference images not displayed]

FINDINGS: Nasogastric tube passes below the diaphragm, tip in the proximal to
mid stomach, well positioned.
IMPRESSION: Well-positioned nasal/orogastric tube.

## 2022-09-22 ENCOUNTER — Telehealth: Payer: Self-pay

## 2022-09-22 ENCOUNTER — Encounter (HOSPITAL_COMMUNITY): Payer: Self-pay

## 2022-09-22 ENCOUNTER — Ambulatory Visit (HOSPITAL_COMMUNITY)
Admission: RE | Admit: 2022-09-22 | Discharge: 2022-09-22 | Disposition: A | Discharge status: Home / Self Care | Payer: Self-pay | Source: Ambulatory Visit | Attending: Hematology and Oncology | Admitting: Hematology and Oncology

## 2022-09-22 DIAGNOSIS — C20 Malignant neoplasm of rectum: Secondary | ICD-10-CM | POA: Insufficient documentation

## 2022-09-22 IMAGING — US US ABDOMEN LIMITED
1 series · 14 of 25 positions shown · non-contrast
Comparison: CT 01/03/2022, MRI 02/16/2021

CLINICAL DATA: Elevated LFT

EXAM:
ULTRASOUND ABDOMEN LIMITED RIGHT UPPER QUADRANT

[Series 1: us abdomen limited ruq (liver/gb) · 14 of 39 slices shown]
[im 1/39]
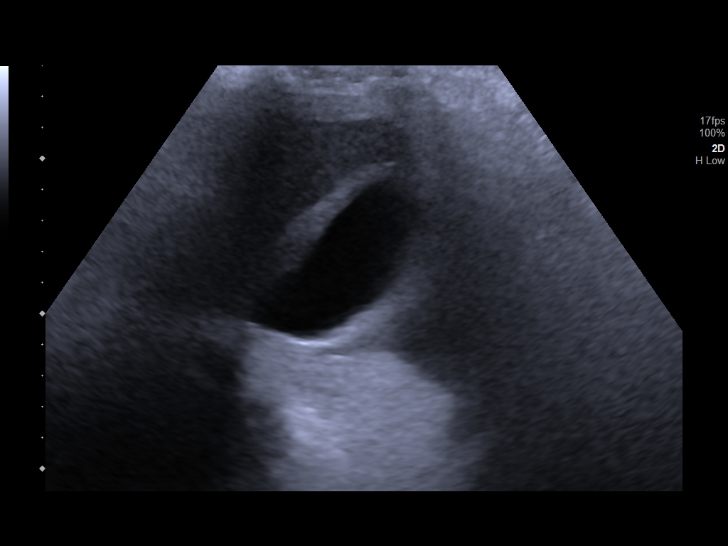
[im 4/39]
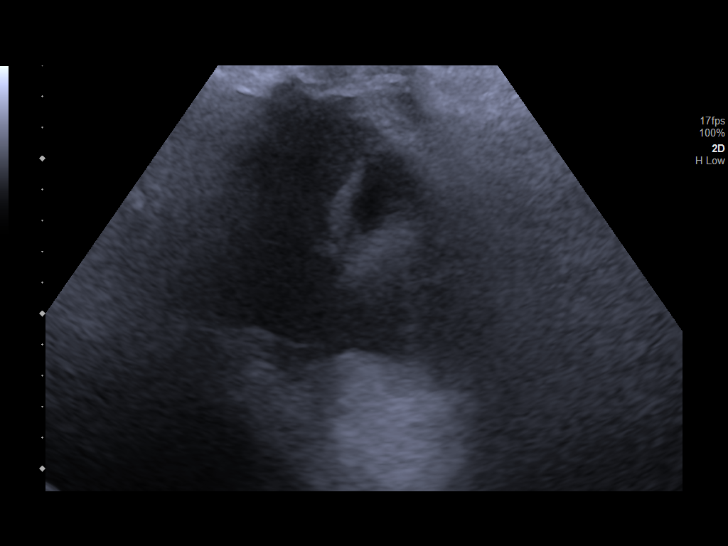
[im 7/39]
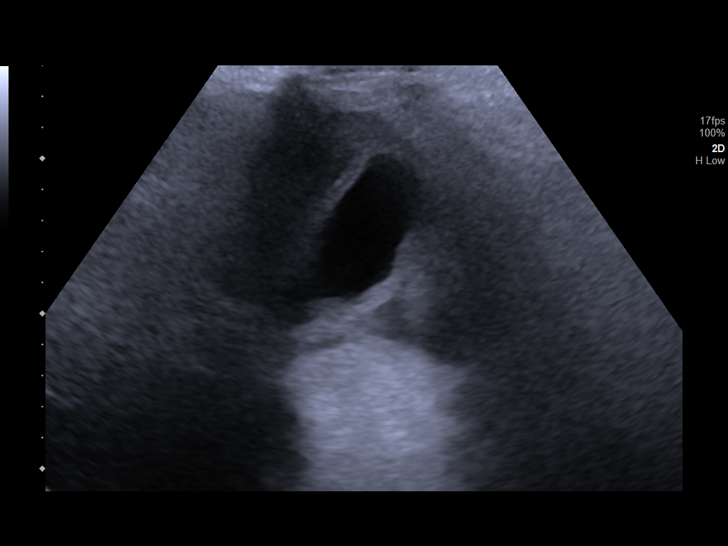
[im 10/39]
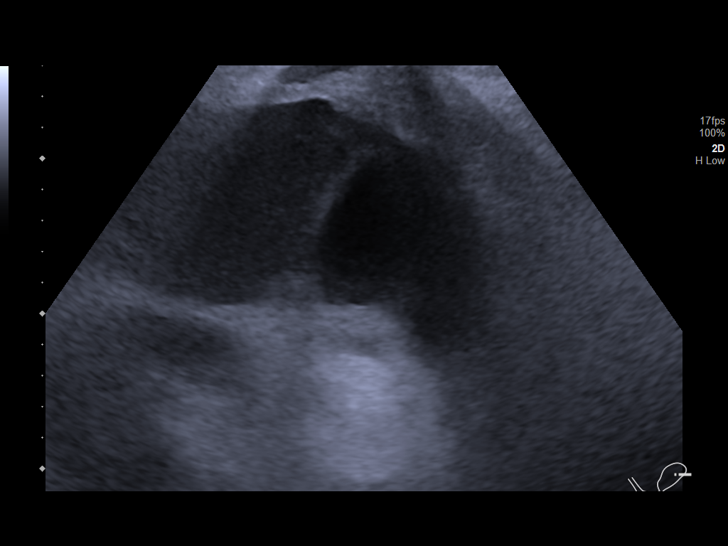
[im 13/39]
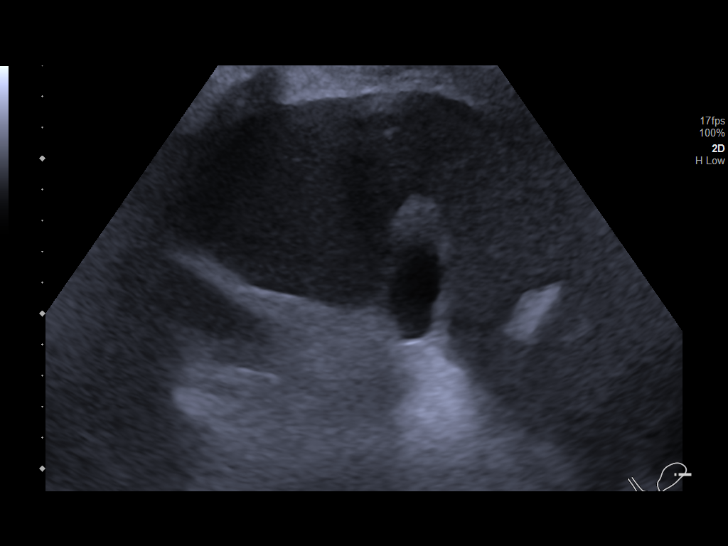
[im 15/39]
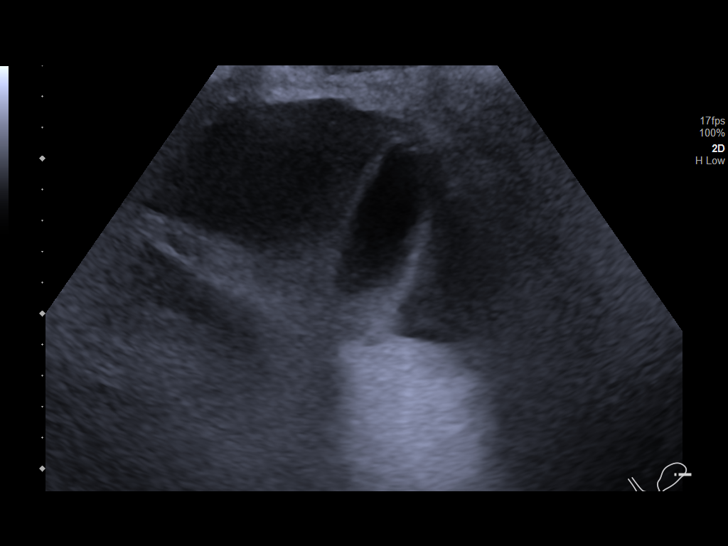
[im 18/39]
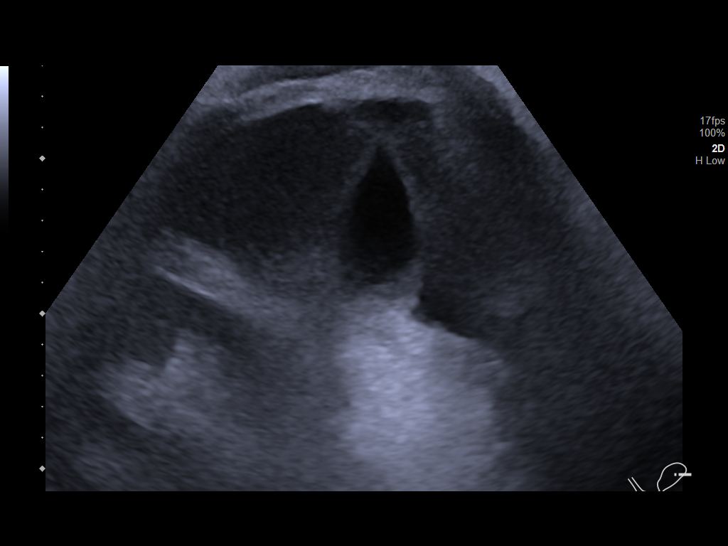
[im 21/39]
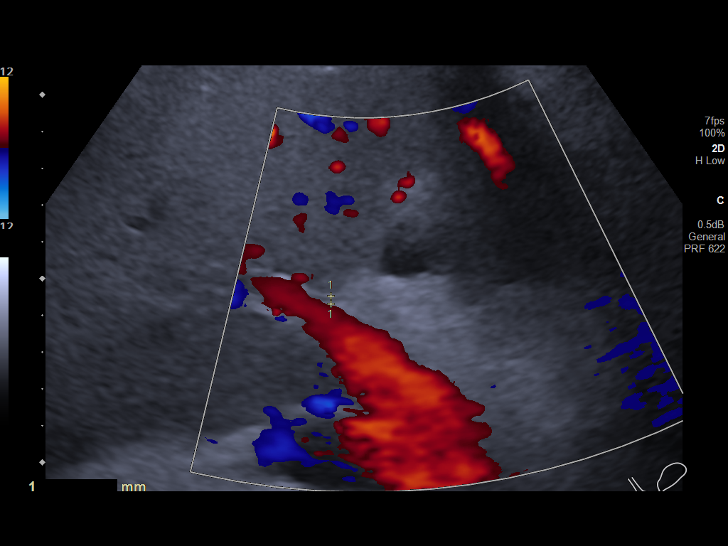
[im 24/39]
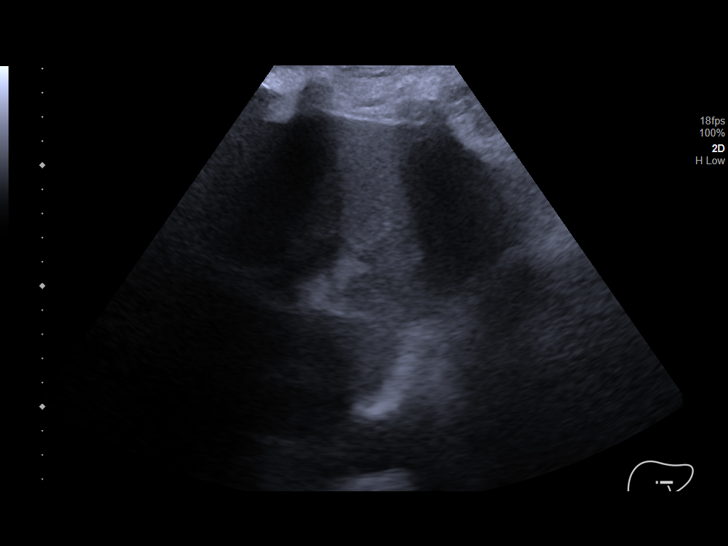
[im 26/39]
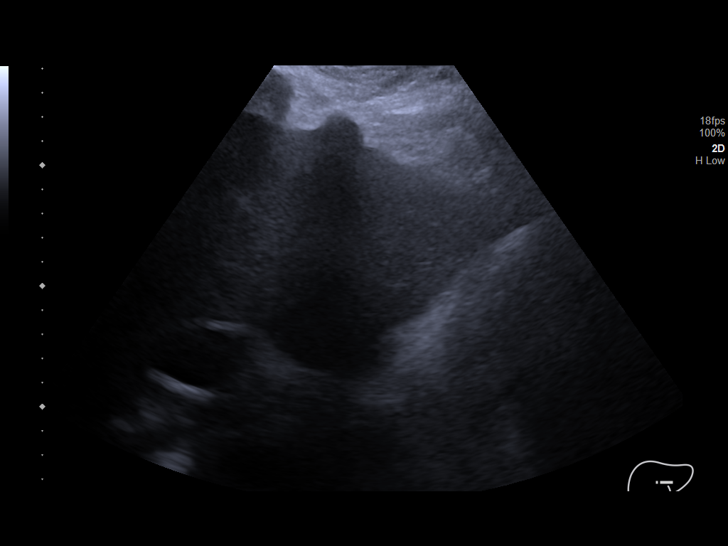
[im 29/39]
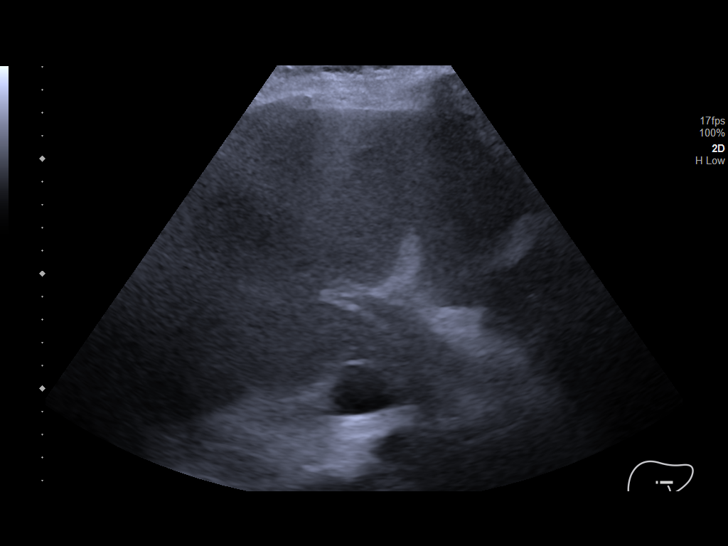
[im 32/39]
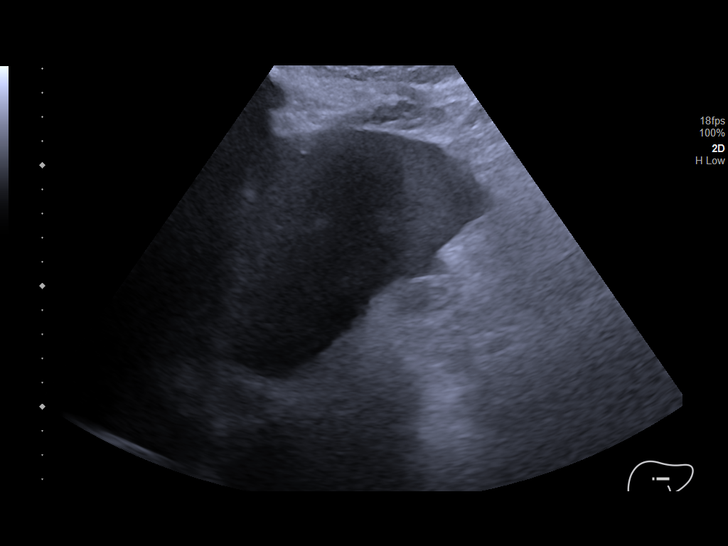
[im 35/39]
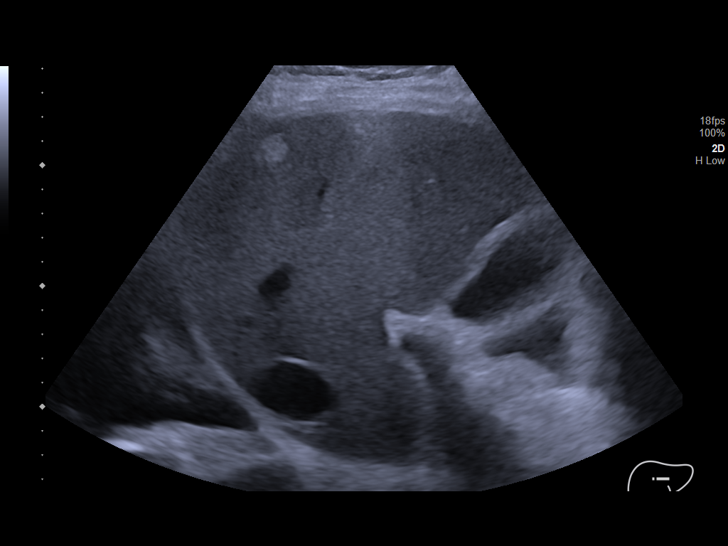
[im 39/39]
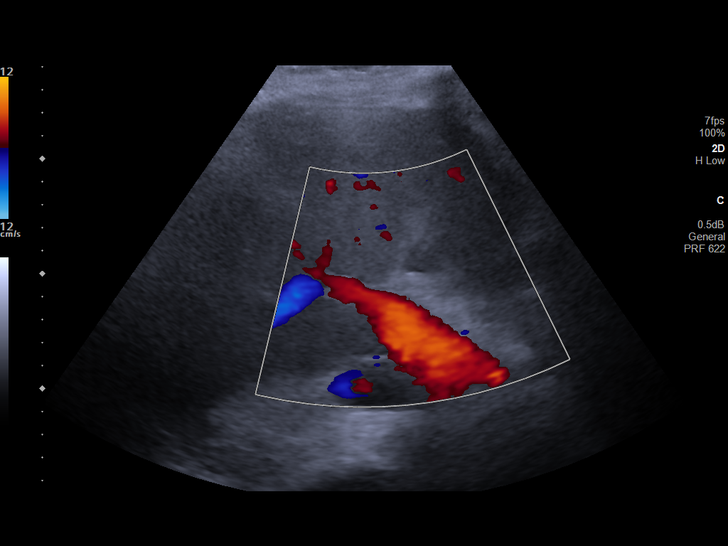

[14 of 25 positions shown; findings below may reference images not displayed]

FINDINGS: Gallbladder:

Negative for shadowing stones. Slight increased gallbladder wall
thickness at 4.9 mm. Negative sonographic Murphy.

Common bile duct:

Diameter: 2.1 mm

Liver:

Echogenic nodule at the right hepatic dome, likely corresponds to
MRI demonstrated hemangioma. Hepatic echogenicity grossly within
normal limits. There is limited visibility due to rib shadowing.
Portal vein is patent on color Doppler imaging with normal direction
of blood flow towards the liver.

Other: Incidental note made of right pleural effusion
IMPRESSION: 1. Nonspecific thickened gallbladder wall without stone disease.
Findings are nonspecific and could be secondary to liver disease,
edema forming states, or cholecystitis. If further imaging is
required, correlation with nuclear medicine hepatobiliary imaging
could be obtained
2. Probable small hemangioma at the hepatic dome. Slightly limited
assessment of liver due to rib shadowing and patient physical
condition
3. Right pleural effusion

## 2022-09-22 IMAGING — US US HEPATIC LIVER DOPPLER
1 series · 13 of 25 positions shown · non-contrast
Comparison: Limited abdominal ultrasound 01/05/2022

CLINICAL DATA: Elevated LFTs.

EXAM:
DUPLEX ULTRASOUND OF LIVER
TECHNIQUE: Color and duplex Doppler ultrasound was performed to evaluate the
hepatic in-flow and out-flow vessels.

[Series 1: us liver doppler · 13 of 37 slices shown]
[im 1/37]
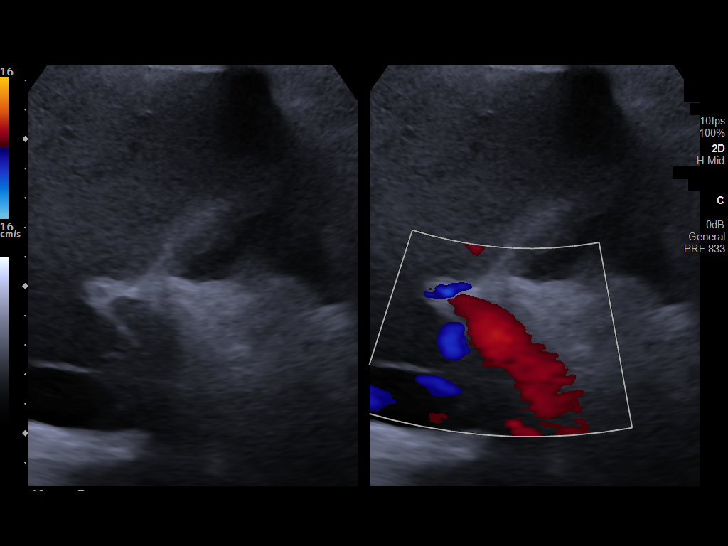
[im 4/37]
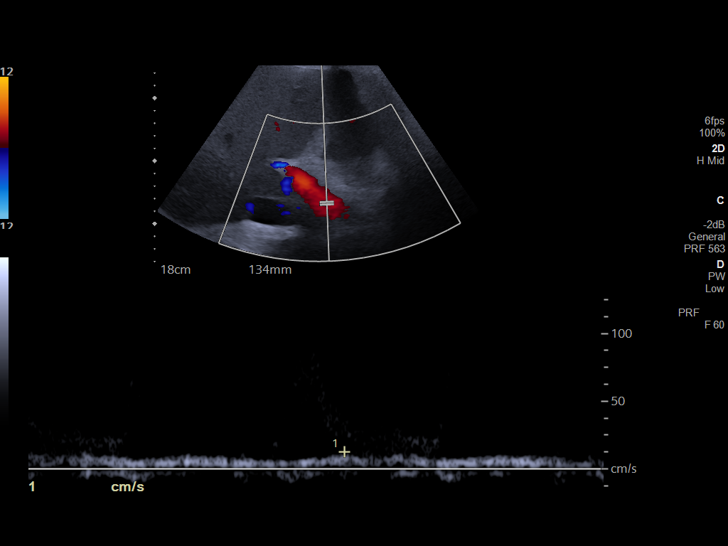
[im 7/37]
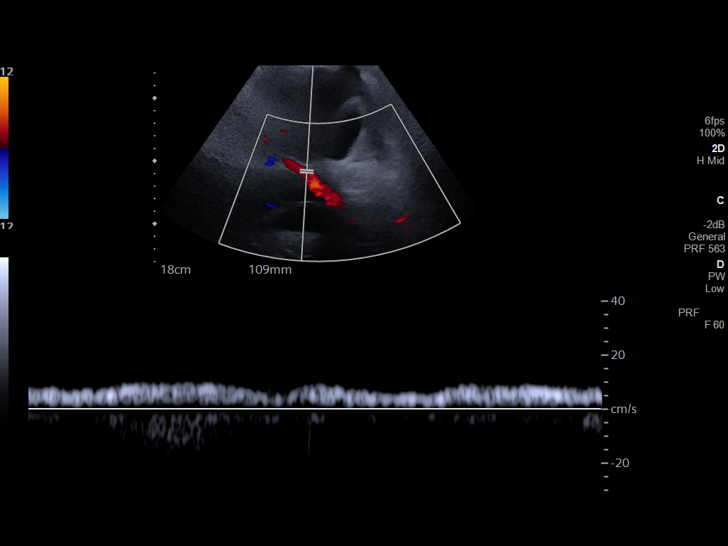
[im 10/37]
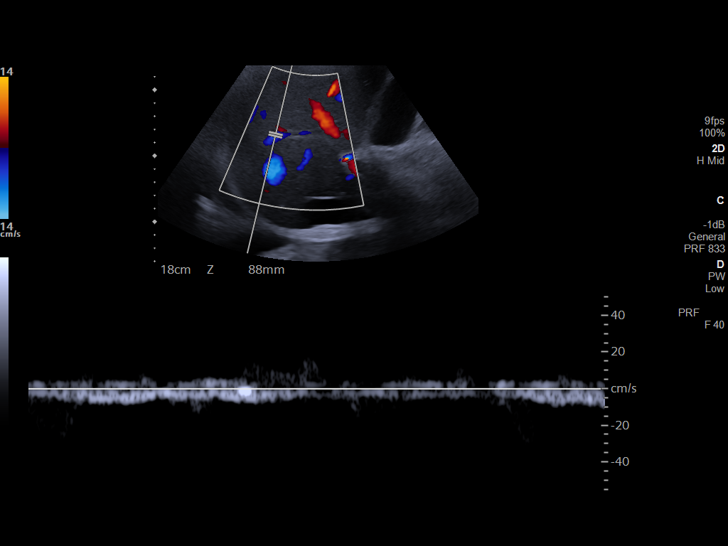
[im 13/37]
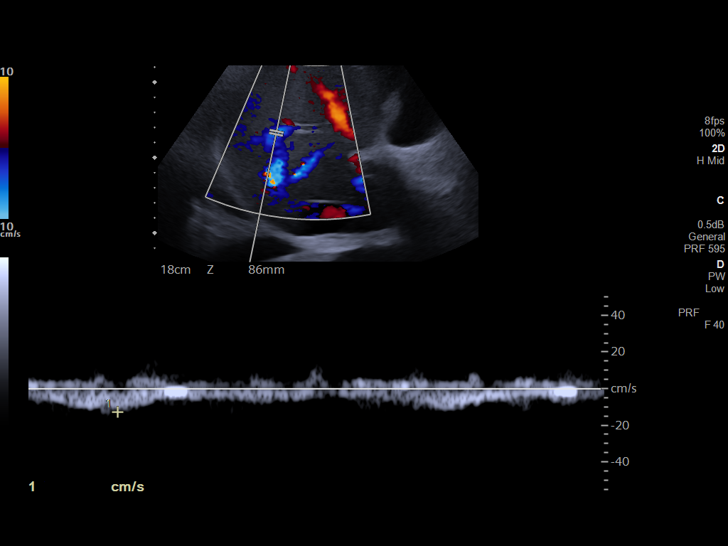
[im 16/37]
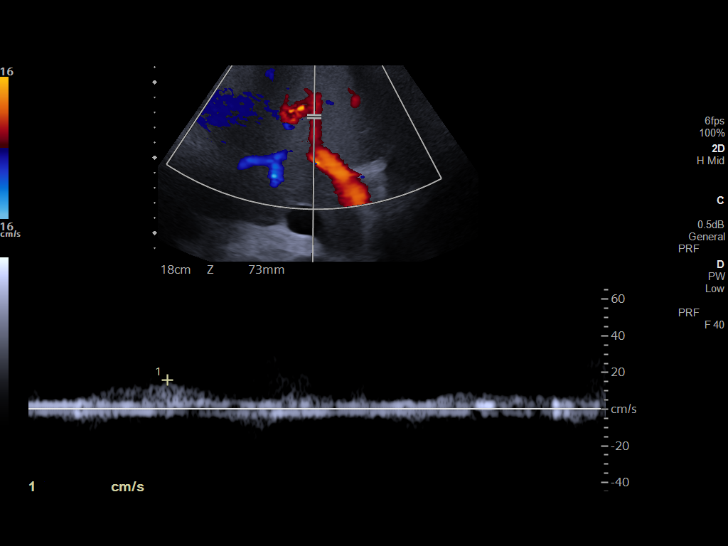
[im 19/37]
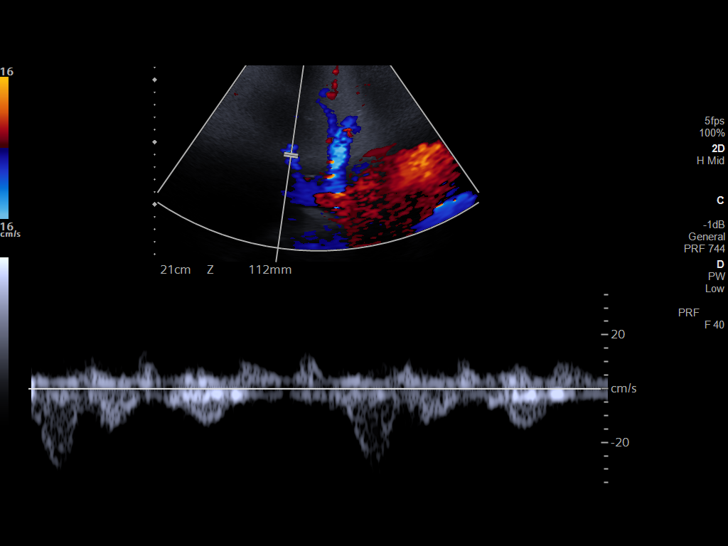
[im 22/37]
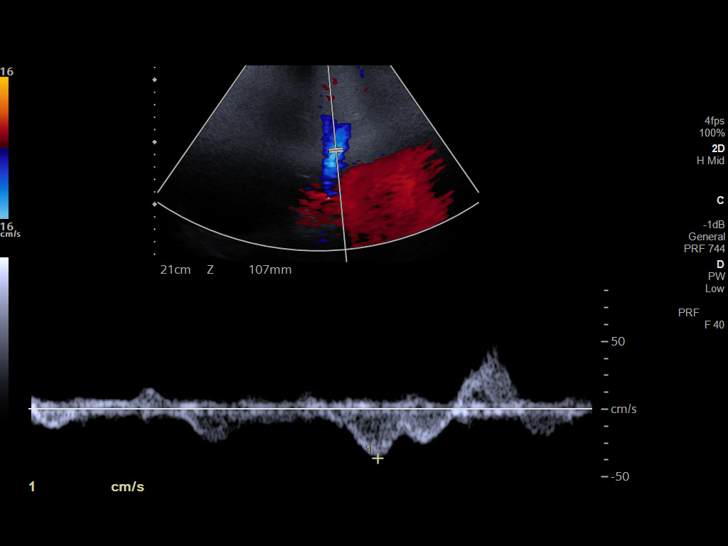
[im 25/37]
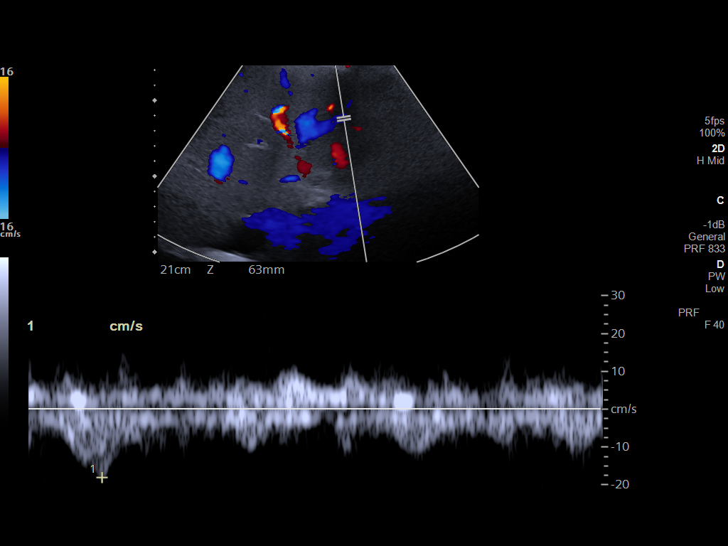
[im 28/37]
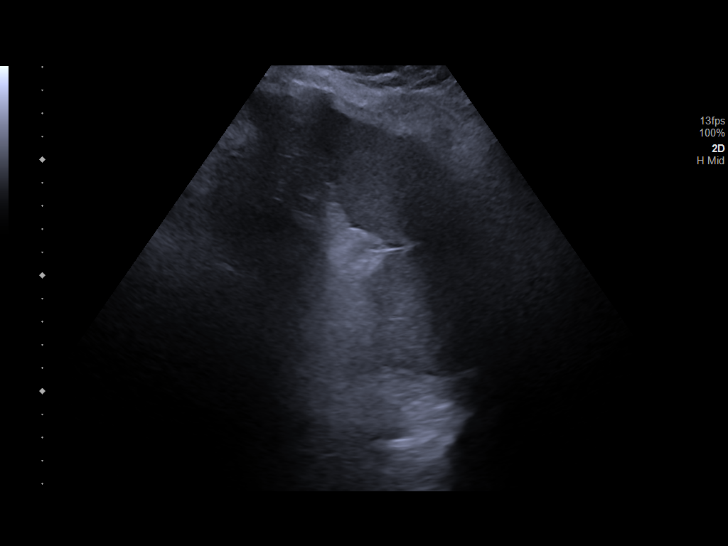
[im 31/37]
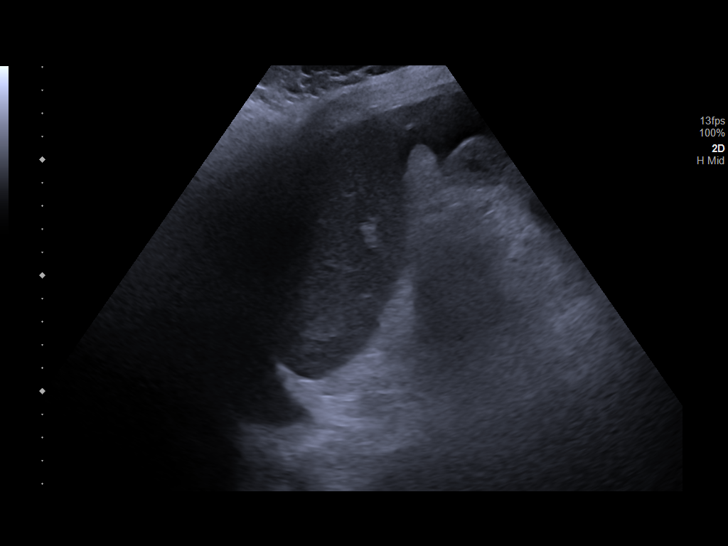
[im 34/37]
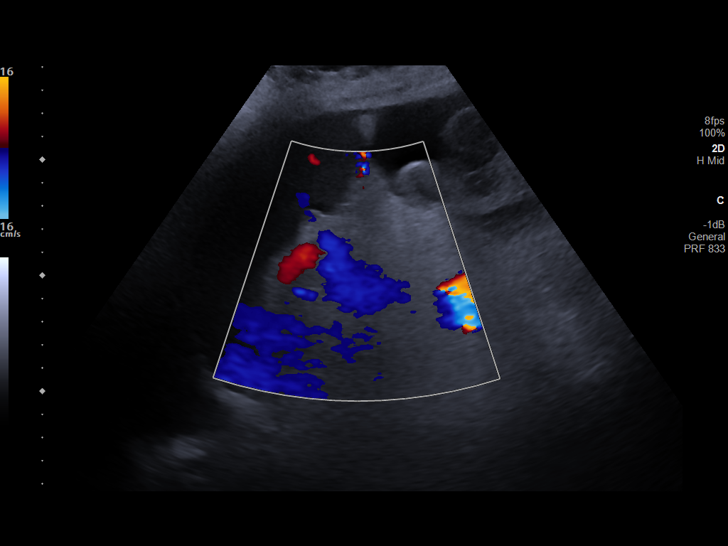
[im 37/37]
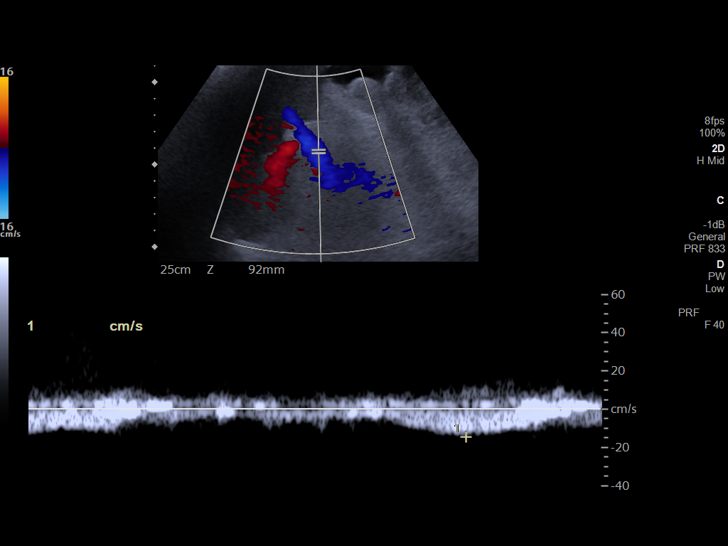

[13 of 25 positions shown; findings below may reference images not displayed]

FINDINGS: Liver: Limited evaluation of the liver parenchyma. Please refer to
the right upper quadrant ultrasound from the same date.

Main Portal Vein size: 1.2 cm

Portal Vein Velocities

Main Prox:  12.6 cm/sec

Main Mid: 13.1 cm/sec

Main Dist:  12.8 cm/sec
Right: 12.8 cm/sec
Left: 15.9 cm/sec

Hepatic Vein Velocities

Right:  27.8 cm/sec

Middle:  36.2 cm/sec

Left:  18.0 cm/sec

IVC: Patent with color Doppler flow.

Hepatic Artery Velocity:  107 cm/sec

Splenic Vein Velocity:  14.5 cm/sec

Spleen: 11.8 cm x 3.8 cm x 4.7 cm with a total volume of 110 cm^3
(411 cm^3 is upper limit normal)

Portal Vein Occlusion/Thrombus: No

Splenic Vein Occlusion/Thrombus: No

Ascites: Present

Varices: None

Normal hepatopetal flow in the portal veins. Normal hepatofugal flow
in the hepatic veins. Ascites in the left upper quadrant. Evidence
for bilateral pleural effusions.
IMPRESSION: 1. Portal venous system is patent with normal direction of flow.
2. Bilateral pleural effusions.  Small amount of ascites.

## 2022-09-22 MED ORDER — IOHEXOL 300 MG/ML  SOLN
100.0000 mL | Freq: Once | INTRAMUSCULAR | Status: AC | PRN
  Administered 2022-09-22: 100 mL via INTRAVENOUS

## 2022-09-22 MED ORDER — IOHEXOL 9 MG/ML PO SOLN
500.0000 mL | ORAL | Status: AC
  Administered 2022-09-22 (×2): 500 mL via ORAL

## 2022-09-22 MED ORDER — SODIUM CHLORIDE (PF) 0.9 % IJ SOLN
INTRAMUSCULAR | Status: AC
  Filled 2022-09-22: qty 50

## 2022-09-22 MED ORDER — IOHEXOL 9 MG/ML PO SOLN
ORAL | Status: AC
  Filled 2022-09-22: qty 500

## 2022-09-22 NOTE — Telephone Encounter (Addendum)
Advised patient of message below.   ----- Message from Orson Slick, MD sent at 09/22/2022  4:04 PM EST ----- Please let Mr. Zisk know that her CT scan looked good, no clear evidence of residual/recurrent disease. He did have a very small 4 mm pulmonary nodule, we will keep an eye on that in future scans. Not likely to be a concerning finding. We will see him back as scheduled in 3 months time.  ----- Message ----- From: Buel Ream, Rad Results In Sent: 09/22/2022   3:57 PM EST To: Orson Slick, MD

## 2022-09-23 IMAGING — DX DG ABD PORTABLE 1V
1 series · 1 of 1 positions shown · non-contrast
Comparison: Radiograph 01/03/2022

CLINICAL DATA: Fever, chills, vomiting, hypertension

EXAM:
PORTABLE ABDOMEN - 1 VIEW

[abdomen supine]
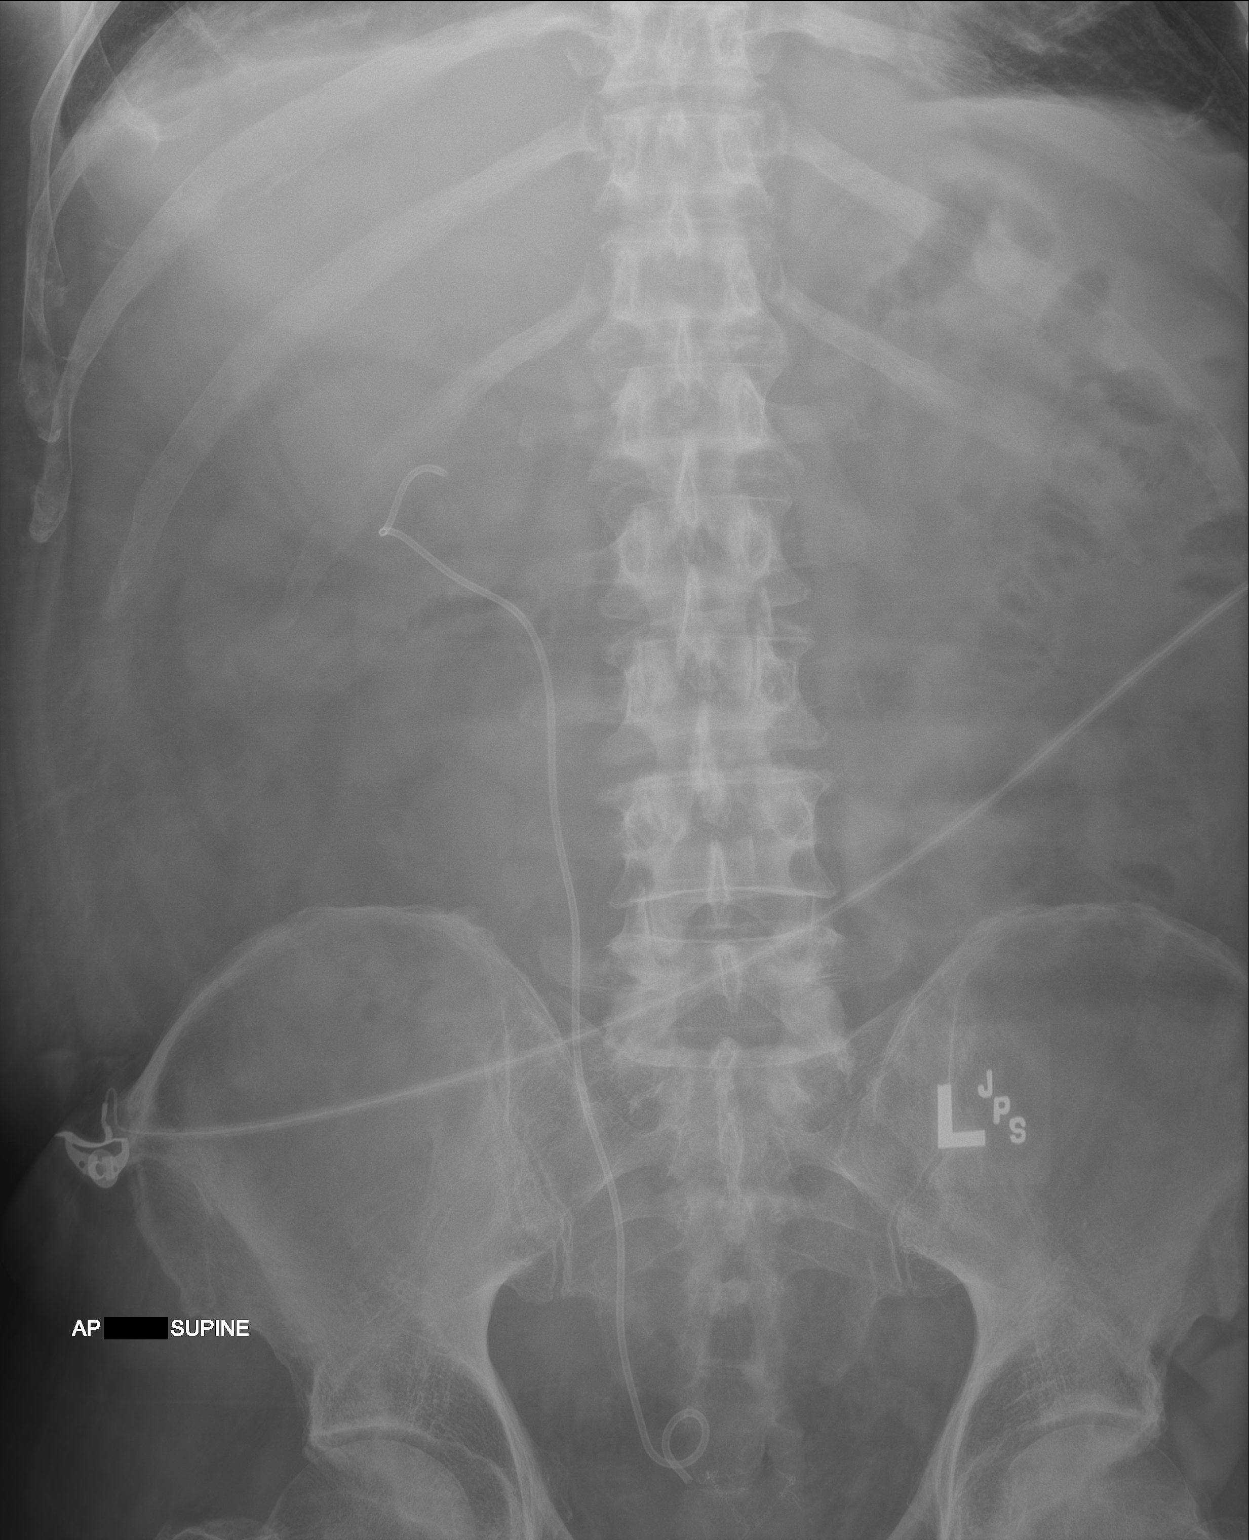

[1 of 1 positions shown; findings below may reference images not displayed]

FINDINGS: Nasogastric tube has been removed. There is a right-sided
nephroureteral stent in place with unchanged appearance. The bowel
gas pattern is nonobstructive.
IMPRESSION: No evidence of bowel obstruction.

## 2022-09-23 IMAGING — DX DG CHEST 1V PORT
1 series · 1 of 1 positions shown · non-contrast
Comparison: None Available.

CLINICAL DATA: Fever, chills, hypertension

EXAM:
PORTABLE CHEST 1 VIEW

[chest ap]
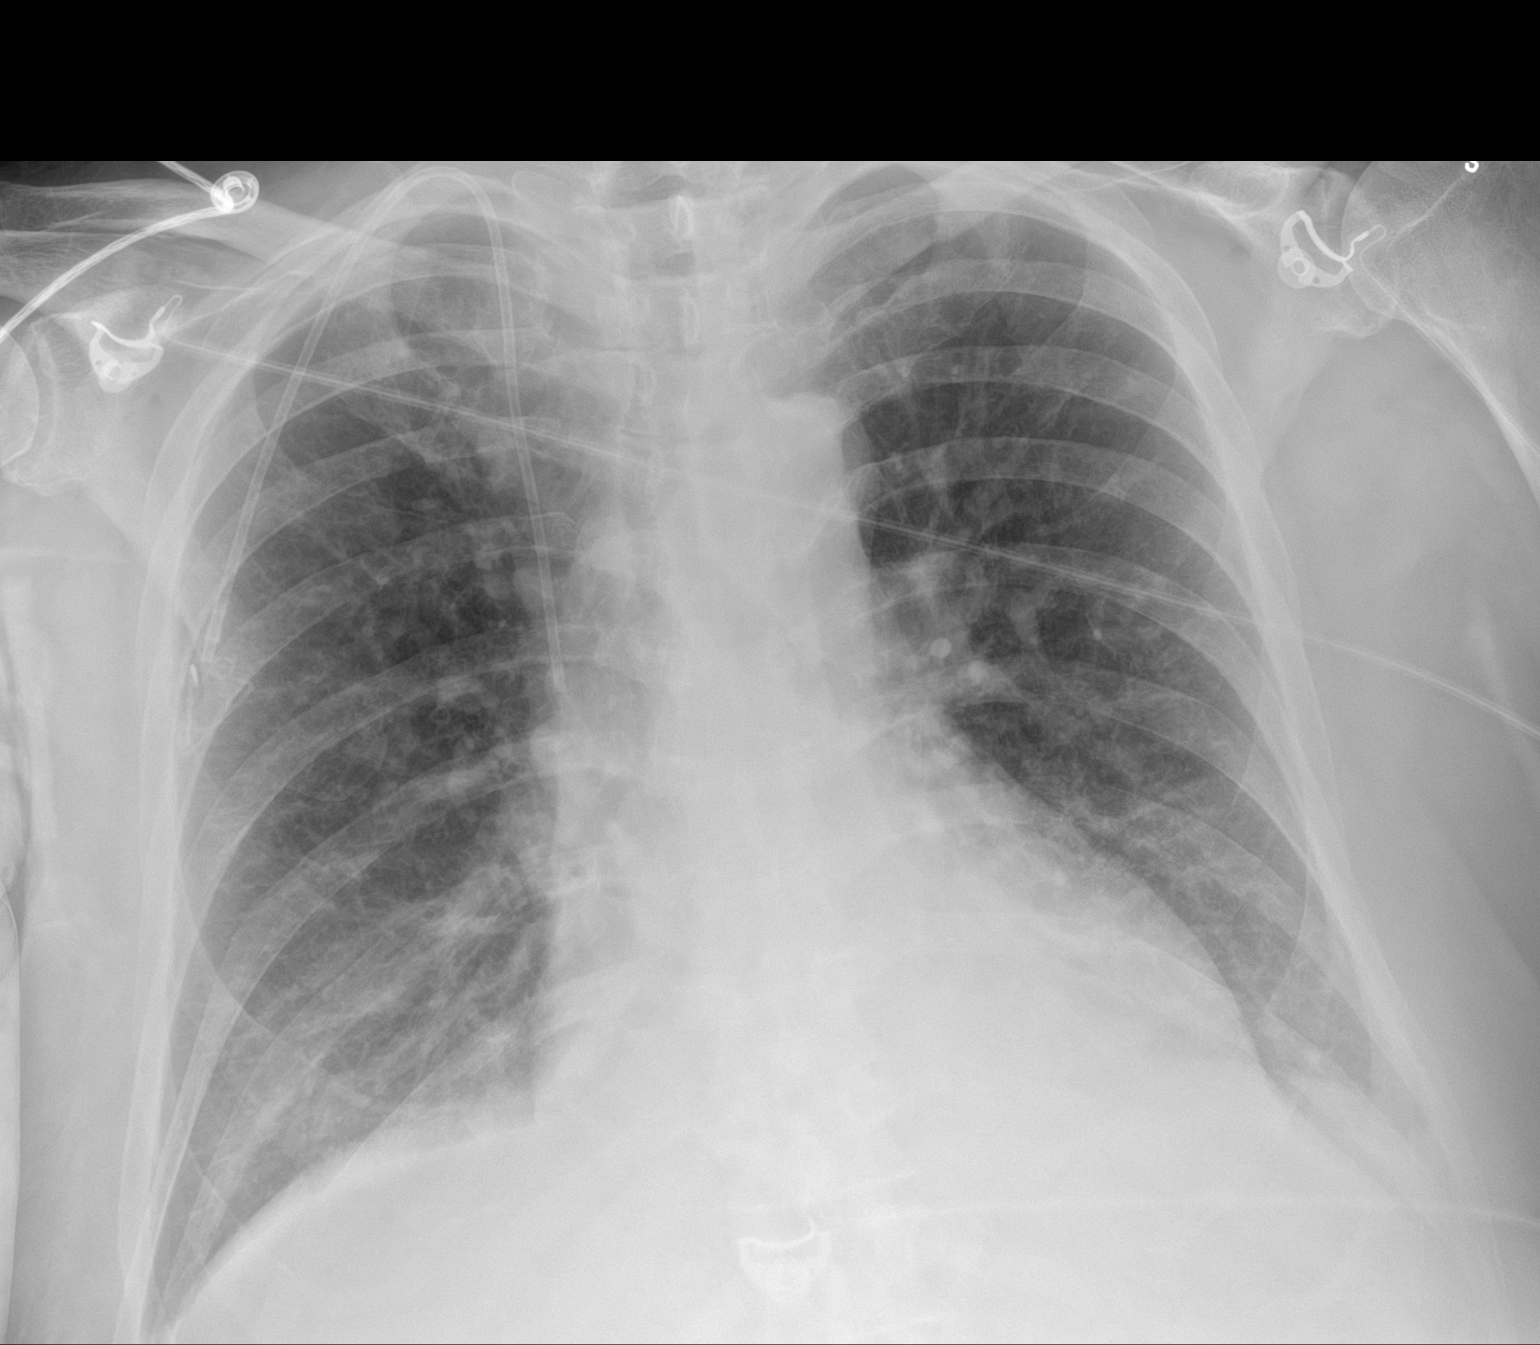

[1 of 1 positions shown; findings below may reference images not displayed]

FINDINGS: Right chest port catheter tip overlies the mid superior vena cava.
Unchanged cardiomediastinal silhouette. There are bibasilar airspace
opacities, left greater than right. Suspected trace pleural
effusions. There are mild interstitial opacities. Upper lung
predominant emphysema. No pneumothorax. No acute osseous
abnormality.
IMPRESSION: Bibasilar airspace opacities, left greater than right, could be
atelectasis or pneumonia.

Interstitial edema and trace pleural effusions.

## 2022-11-24 ENCOUNTER — Encounter: Payer: Self-pay | Admitting: Gastroenterology

## 2022-12-09 ENCOUNTER — Inpatient Hospital Stay: Payer: Medicare HMO | Attending: Hematology and Oncology

## 2022-12-09 ENCOUNTER — Encounter: Payer: Self-pay | Admitting: Hematology and Oncology

## 2022-12-09 ENCOUNTER — Inpatient Hospital Stay (HOSPITAL_BASED_OUTPATIENT_CLINIC_OR_DEPARTMENT_OTHER): Payer: Medicare HMO | Admitting: Hematology and Oncology

## 2022-12-09 ENCOUNTER — Other Ambulatory Visit: Payer: Self-pay

## 2022-12-09 ENCOUNTER — Other Ambulatory Visit: Payer: Self-pay | Admitting: Hematology and Oncology

## 2022-12-09 VITALS — BP 183/90 | HR 66 | Temp 97.9°F | Resp 13 | Wt 216.0 lb

## 2022-12-09 DIAGNOSIS — Z95828 Presence of other vascular implants and grafts: Secondary | ICD-10-CM

## 2022-12-09 DIAGNOSIS — F17210 Nicotine dependence, cigarettes, uncomplicated: Secondary | ICD-10-CM | POA: Diagnosis not present

## 2022-12-09 DIAGNOSIS — Z803 Family history of malignant neoplasm of breast: Secondary | ICD-10-CM | POA: Insufficient documentation

## 2022-12-09 DIAGNOSIS — Z87442 Personal history of urinary calculi: Secondary | ICD-10-CM | POA: Insufficient documentation

## 2022-12-09 DIAGNOSIS — Z8249 Family history of ischemic heart disease and other diseases of the circulatory system: Secondary | ICD-10-CM | POA: Insufficient documentation

## 2022-12-09 DIAGNOSIS — N179 Acute kidney failure, unspecified: Secondary | ICD-10-CM | POA: Insufficient documentation

## 2022-12-09 DIAGNOSIS — C20 Malignant neoplasm of rectum: Secondary | ICD-10-CM | POA: Diagnosis not present

## 2022-12-09 DIAGNOSIS — Z881 Allergy status to other antibiotic agents status: Secondary | ICD-10-CM | POA: Diagnosis not present

## 2022-12-09 LAB — CMP (CANCER CENTER ONLY)
ALT: 13 U/L (ref 0–44)
AST: 15 U/L (ref 15–41)
Albumin: 4.2 g/dL (ref 3.5–5.0)
Alkaline Phosphatase: 82 U/L (ref 38–126)
Anion gap: 6 (ref 5–15)
BUN: 22 mg/dL (ref 8–23)
CO2: 25 mmol/L (ref 22–32)
Calcium: 8.8 mg/dL — ABNORMAL LOW (ref 8.9–10.3)
Chloride: 108 mmol/L (ref 98–111)
Creatinine: 1.4 mg/dL — ABNORMAL HIGH (ref 0.61–1.24)
GFR, Estimated: 56 mL/min — ABNORMAL LOW (ref >60)
Glucose, Bld: 137 mg/dL — ABNORMAL HIGH (ref 70–99)
Potassium: 4 mmol/L (ref 3.5–5.1)
Sodium: 139 mmol/L (ref 135–145)
Total Bilirubin: 0.3 mg/dL (ref 0.3–1.2)
Total Protein: 7.1 g/dL (ref 6.5–8.1)

## 2022-12-09 LAB — CEA (ACCESS): CEA (CHCC): 1.56 ng/mL (ref 0.00–5.00)

## 2022-12-09 LAB — CBC WITH DIFFERENTIAL (CANCER CENTER ONLY)
Abs Immature Granulocytes: 0.02 10*3/uL (ref 0.00–0.07)
Basophils Absolute: 0.1 10*3/uL (ref 0.0–0.1)
Basophils Relative: 1 %
Eosinophils Absolute: 0.3 10*3/uL (ref 0.0–0.5)
Eosinophils Relative: 4 %
HCT: 41.3 % (ref 39.0–52.0)
Hemoglobin: 14 g/dL (ref 13.0–17.0)
Immature Granulocytes: 0 %
Lymphocytes Relative: 17 %
Lymphs Abs: 1.2 10*3/uL (ref 0.7–4.0)
MCH: 29.9 pg (ref 26.0–34.0)
MCHC: 33.9 g/dL (ref 30.0–36.0)
MCV: 88.1 fL (ref 80.0–100.0)
Monocytes Absolute: 0.4 10*3/uL (ref 0.1–1.0)
Monocytes Relative: 6 %
Neutro Abs: 5 10*3/uL (ref 1.7–7.7)
Neutrophils Relative %: 72 %
Platelet Count: 217 10*3/uL (ref 150–400)
RBC: 4.69 MIL/uL (ref 4.22–5.81)
RDW: 13.8 % (ref 11.5–15.5)
WBC Count: 6.9 10*3/uL (ref 4.0–10.5)
nRBC: 0 % (ref 0.0–0.2)

## 2022-12-09 LAB — MAGNESIUM: Magnesium: 1.7 mg/dL (ref 1.7–2.4)

## 2022-12-09 NOTE — Progress Notes (Signed)
[facility] [phone]   [phone]  PROGRESS NOTE  Patient Care Team: Pcp, No as PCP - General Jaci Standard, MD as Consulting Physician (Medical Oncology) Marily Lente, RN as Oncology Nurse Navigator (Oncology)  Hematological/Oncological History # Rectal Adenocarcinoma, T4N2M0. Stage IIIc, In Remission 01/16/2021: presented to the emergency department with abdominal pain. CT abdomen/pelvis showed concern for mass lesion with perforation and surrounding adenopathy. 01/19/2021: repeat CT scan showed worsening inflammatory changes around the complex rectal process 01/21/2021: flexible sigmoidoscopy performed, lesion at 12 cm from the anal verge.  biopsy showed concern for adenocarcinoma. 01/21/2021: patient underwent a laparoscopic assisted loop colostomy with Dr. Corliss Skains. 02/09/2021: establish care with Dr. Leonides Schanz  03/05/2021: Cycle 1 Day 1 of neoadjuvant FOLFOX 03/18/2021: Cycle 2 Day 1 of neoadjuvant FOLFOX 04/02/2021: Cycle 3 Day 1 of neoadjuvant FOLFOX 04/15/2021: Cycle 4 Day 1 of neoadjuvant FOLFOX 04/30/2021: Cycle 5 Day 1 of neoadjuvant FOLFOX 05/14/2021: Cycle 6 Day 1 of neoadjuvant FOLFOX 05/27/2021: Cycle 7 Day 1 of neoadjuvant FOLFOX 06/10/2021: Cycle 8 Day 1 of neoadjuvant FOLFOX delayed due to neutropenia.  06/22/2021: Cycle 8 Day 1 of neoadjuvant FOLFOX 07/13/2021: start of Chemoradiation with Xeloda 825 mg/m2 BID 08/21/2021: Last day of chemoradiation 09/09/2021: pre-surgical colonoscopy performed, no concerning abnormalities.  11/04/2021: LAR[facility]DLI at [facility] with Dr. Luciano Cutter 03/17/2022: Loop ileostomy takedown.   Interval History:  Prateek Wheeless 67 y.o. male with medical history significant for rectal adenocarcinoma who presents for a follow up visit. The patient's last visit was on 09/03/2022. In the interim since last visit he has had no major changes in his health.  On exam today Mr. Mcnelis reports he has been doing excellently interim  since her last visit.  He is working 4 days a week and has excellent levels of energy.  He has he enjoys doing a full day of work but thinks you start slowing down because his age is advancing.  His weight is up to 216 pounds, up from 207 pounds in February and 192 pounds in November.  He reports that he does have some issues with his bowels running in the morning and reports that he sometimes feels like he finished but then stands up and has to continue.  He reports that his bowel movements are normal, solid brown stool.  He reports that he does need to schedule his repeat colonoscopy and will be doing that shortly.  He has received the information notification from the GI office.  Otherwise he is at his baseline level of health and has no questions concerns or complaints today.Marland Kitchen  He currently denies any nausea, vomiting.  He denies any fevers, chills, night sweats, shortness of breath, chest pain, cough, neuropathy, mucositis or rash. A full 10 point ROS is listed below.  MEDICAL HISTORY:  Past Medical History:  Diagnosis Date   History of kidney stones    Rectosigmoid cancer (HCC)     SURGICAL HISTORY: Past Surgical History:  Procedure Laterality Date   BIOPSY  01/21/2021   Procedure: BIOPSY;  Surgeon: Napoleon Form, MD;  Location: [facility];  Service: Endoscopy;;   COLON RESECTION N/A 01/21/2021   Procedure: LAPAROSCOPIC ASSISTED LOOP COLOSTOMY;  Surgeon: Manus Rudd, MD;  Location: [facility] OR;  Service: General;  Laterality: N/A;   COLON RESECTION SIGMOID  11/04/2021   CYSTOSCOPY W/ URETERAL STENT PLACEMENT Right 01/03/2022   Procedure: CYSTOSCOPY WITH  URETERAL STENT PLACEMENT;  Surgeon: Belva Agee, MD;  Location: [facility] OR;  Service:  Urology;  Laterality: Right;   CYSTOSCOPY/URETEROSCOPY/HOLMIUM LASER/STENT PLACEMENT Right 02/09/2022   Procedure: CYSTOSCOPY/URETEROSCOPY/STENT EXCHANGE;  Surgeon: Belva Agee, MD;  Location: [facility];  Service: Urology;  Laterality: Right;  30  MINUTES   FLEXIBLE SIGMOIDOSCOPY N/A 01/21/2021   Procedure: FLEXIBLE SIGMOIDOSCOPY;  Surgeon: Napoleon Form, MD;  Location: [facility];  Service: Endoscopy;  Laterality: N/A;   IR IMAGING GUIDED PORT INSERTION  03/04/2021   IR REMOVAL TUN ACCESS W/ PORT W/O FL MOD SED  04/21/2022   SUBMUCOSAL TATTOO INJECTION  01/21/2021   Procedure: SUBMUCOSAL TATTOO INJECTION;  Surgeon: Napoleon Form, MD;  Location: [facility];  Service: Endoscopy;;    SOCIAL HISTORY: Social History   Socioeconomic History   Marital status: Divorced    Spouse name: Not on file   Number of children: Not on file   Years of education: Not on file   Highest education level: Not on file  Occupational History   Not on file  Tobacco Use   Smoking status: Some Days    Types: Cigarettes    Last attempt to quit: 01/05/2022    Years since quitting: 0.9   Smokeless tobacco: Never  Vaping Use   Vaping Use: Never used  Substance and Sexual Activity   Alcohol use: Never   Drug use: Never   Sexual activity: Never  Other Topics Concern   Not on file  Social History Narrative   Not on file   Social Determinants of Health   Financial Resource Strain: Low Risk  (02/09/2021)   Overall Financial Resource Strain (CARDIA)    Difficulty of Paying Living Expenses: Not very hard  Food Insecurity: No Food Insecurity (02/09/2021)   Hunger Vital Sign    Worried About Running Out of Food in the Last Year: Never true    Ran Out of Food in the Last Year: Never true  Transportation Needs: No Transportation Needs (02/09/2021)   PRAPARE - Administrator, Civil Service (Medical): No    Lack of Transportation (Non-Medical): No  Physical Activity: Not on file  Stress: Not on file  Social Connections: Not on file  Intimate Partner Violence: Not At Risk (02/26/2021)   Humiliation, Afraid, Rape, and Kick questionnaire    Fear of Current or Ex-Partner: No    Emotionally Abused: No    Physically Abused: No     Sexually Abused: No    FAMILY HISTORY: Family History  Problem Relation Age of Onset   Hypertension Mother    Hypertension Father    Breast cancer Sister     ALLERGIES:  is allergic to cefazolin, leucovorin, and oxaliplatin.  MEDICATIONS:  Current Outpatient Medications  Medication Sig Dispense Refill   acetaminophen (TYLENOL) 500 MG tablet Take 1,000 mg by mouth every 8 (eight) hours as needed for headache or moderate pain.     loperamide (IMODIUM) 2 MG capsule Take 4 mg by mouth daily as needed for diarrhea or loose stools. Up to 6 /day     No current facility-administered medications for this visit.    REVIEW OF SYSTEMS:   Constitutional: ( - ) fevers, ( - )  chills , ( - ) night sweats Eyes: ( - ) blurriness of vision, ( - ) double vision, ( - ) watery eyes Ears, nose, mouth, throat, and face: ( - ) mucositis, ( - ) sore throat Respiratory: ( - ) cough, ( - ) dyspnea, ( - ) wheezes Cardiovascular: ( - ) palpitation, ( - )  chest discomfort, ( - ) lower extremity swelling Gastrointestinal:  ( - ) nausea, ( - ) heartburn, ( - ) change in bowel habits Skin: ( - ) abnormal skin rashes Lymphatics: ( - ) new lymphadenopathy, ( - ) easy bruising Neurological: ( - ) numbness, ( - ) tingling, ( - ) new weaknesses Behavioral/Psych: ( - ) mood change, ( - ) new changes  All other systems were reviewed with the patient and are negative.  PHYSICAL EXAMINATION: ECOG PERFORMANCE STATUS: 1 - Symptomatic but completely ambulatory  There were no vitals filed for this visit.    There were no vitals filed for this visit.     GENERAL: Well-appearing middle-aged [place] male, alert, no distress and comfortable SKIN: skin color, texture, turgor are normal, no rashes or significant lesions EYES: conjunctiva are pink and non-injected, sclera clear LUNGS: clear to auscultation and percussion with normal breathing effort HEART: regular rate & rhythm and no murmurs and no lower extremity  edema ABDOMEN: soft, non-tender, non-distended, normal bowel sounds.   Musculoskeletal: no cyanosis of digits and no clubbing  PSYCH: alert & oriented x 3, fluent speech NEURO: no focal motor/sensory deficits  LABORATORY DATA:  I have reviewed the data as listed    Latest Ref Rng & Units 09/03/2022    9:49 AM 06/03/2022    9:40 AM 03/03/2022   10:23 AM  CBC  WBC 4.0 - 10.5 K/uL 5.4  5.0  3.7   Hemoglobin 13.0 - 17.0 g/dL 78.4  69.6  29.5   Hematocrit 39.0 - 52.0 % 39.2  36.9  30.4   Platelets 150 - 400 K/uL 211  229  226        Latest Ref Rng & Units 09/03/2022    9:49 AM 06/03/2022    9:40 AM 03/03/2022   10:23 AM  CMP  Glucose 70 - 99 mg/dL 284  132  440   BUN 8 - 23 mg/dL 20  28  24    Creatinine 0.61 - 1.24 mg/dL 1.02  7.25  3.66   Sodium 135 - 145 mmol/L 139  138  136   Potassium 3.5 - 5.1 mmol/L 3.9  4.3  4.1   Chloride 98 - 111 mmol/L 107  108  107   CO2 22 - 32 mmol/L 27  24  25    Calcium 8.9 - 10.3 mg/dL 9.1  9.0  9.1   Total Protein 6.5 - 8.1 g/dL 7.1  7.1  7.6   Total Bilirubin 0.3 - 1.2 mg/dL 0.4  0.3  0.4   Alkaline Phos 38 - 126 U/L 77  80  88   AST 15 - 41 U/L 14  13  19    ALT 0 - 44 U/L 11  10  15      RADIOGRAPHIC STUDIES: I have personally reviewed the radiological images as listed and agreed with the findings in the report: no evidence of metastatic diease in the chest. MRI abdomen does not show metastatic disease.   No results found.  ASSESSMENT & PLAN Torian Tomb 67 y.o. male with medical history significant for rectal adenocarcinoma who presents for a follow up visit.   At this time the patient's findings are most consistent with localized rectal adenocarcinoma.  MRI of the pelvis confirmed this is a T4 N2 M0 stage IIIc cancer.  As such we will plan to proceed with neoadjuvant chemotherapy, followed by chemoradiation, followed by reevaluation for consideration of transabdominal resection.  The initial treatment of choice would  be neoadjuvant FOLFOX  chemotherapy for 8 cycles.  Once this is complete we will transition to chemoradiation with p.o. capecitabine therapy.  When these are complete we will restage for consideration of surgical resection.  # Rectal Adenocarcinoma, T4N2M0 Stage IIIc, in Remission.  --  staging with a CT scan of the chest and MRI abdomen/pelvis findings are consistent with localized disease.   --CEA modestly elevated at 17.44 on 02/09/2021 prior to start of therapy. --Given that this is adenocarcinoma of the rectum I would recommend proceeding with neoadjuvant chemotherapy, followed by chemoradiation, followed by definitive surgery. Case discussed at [facility] --patient completed 8 cycles of neoadjuvant FOLFOX --started chemoradiation therapy with Xeloda on 07/13/2021. Last treatment on 08/21/2021.  --last day of chemoradiation on 08/21/2021  Plan:   -- underwent LAR/DLI on 11/04/2021 at [facility] with Dr. Luciano Cutter.  Underwent ileostomy reversal on 03/17/2022 --plan for CT C/A/P q 6 months per NCCN recommendations. Last scan on 01/03/2022. Next CT scan due August 2024.  --continue clinic visits q 3 months with CEA q 3-6 months x 2 years, then q 6-12 months for a total of 5 years. --plan for colonoscopy x 1 year post-operatively (April 2024). Currently due.  -- labs today show WBC 6.9, Hgb 14.0, MCV 88.1, Plt 217 --RTC in 12 weeks.   # AKI --Cr 1.40 today, stable --encourage hydration  # Hyperglyclemia-resolved --BG elevated, patient has no history of DM type II. Unclear why this occurred.  -- abnormal elevations occurred during chemotherapy treatment.  --off glipizide  --continue to monitor.   #Supportive Care -- chemotherapy education complete -- port removed on 04/21/2022 -- no pain medication required at this time.   No orders of the defined types were placed in this encounter.   All questions were answered. The patient knows to call the clinic with any problems, questions or concerns.  I have spent a total  of 30 minutes minutes of face-to-face and non-face-to-face time, preparing to see the patient, obtaining and/or reviewing separately obtained history, performing a medically appropriate examination, counseling and educating the patient, ordering medications,  communicating with other health care professionals, documenting clinical information in the electronic health record, and care coordination.   Ulysees Barns, MD Department of Hematology/Oncology [facility] at [facility] Phone: [phone] Pager: [phone] Email: [email or web address]   12/09/2022 7:01 AM  Hofheinz RD, Allyson Sabal, Post S, Matzdorff A, Laechelt S, Aggie Cosier, Mller L, Link H, Moehler M, Gwynneth Albright, [place], [place], [place], Hipp M, Hartung G, Gencer D, [place], [place] I, Hochhaus A. Chemoradiotherapy with capecitabine versus fluorouracil for locally advanced rectal cancer: a randomised, multicentre, non-inferiority, phase 3 trial. Lancet Oncol. 2012 Jun;13(6):579-88.

## 2023-02-11 ENCOUNTER — Encounter: Payer: Self-pay | Admitting: *Deleted

## 2023-03-07 ENCOUNTER — Telehealth: Payer: Self-pay

## 2023-03-07 NOTE — Telephone Encounter (Signed)
Spoke with patient informing him that his FMLA documents had been completed placed up front for pick-up as requested. Also notified him that when he picks up documents we need a signed ROI

## 2023-03-11 ENCOUNTER — Other Ambulatory Visit: Payer: Medicare HMO

## 2023-03-11 ENCOUNTER — Ambulatory Visit: Payer: Medicare HMO | Admitting: Hematology and Oncology

## 2023-03-22 ENCOUNTER — Inpatient Hospital Stay (HOSPITAL_BASED_OUTPATIENT_CLINIC_OR_DEPARTMENT_OTHER): Payer: Medicare HMO | Admitting: Hematology and Oncology

## 2023-03-22 ENCOUNTER — Other Ambulatory Visit: Payer: Self-pay | Admitting: Hematology and Oncology

## 2023-03-22 ENCOUNTER — Inpatient Hospital Stay: Payer: Medicare HMO | Attending: Hematology and Oncology

## 2023-03-22 VITALS — BP 197/88 | HR 58 | Temp 97.7°F | Resp 16 | Wt 216.9 lb

## 2023-03-22 DIAGNOSIS — Z8249 Family history of ischemic heart disease and other diseases of the circulatory system: Secondary | ICD-10-CM | POA: Insufficient documentation

## 2023-03-22 DIAGNOSIS — Z923 Personal history of irradiation: Secondary | ICD-10-CM | POA: Insufficient documentation

## 2023-03-22 DIAGNOSIS — C20 Malignant neoplasm of rectum: Secondary | ICD-10-CM

## 2023-03-22 DIAGNOSIS — Z87442 Personal history of urinary calculi: Secondary | ICD-10-CM | POA: Insufficient documentation

## 2023-03-22 DIAGNOSIS — Z881 Allergy status to other antibiotic agents status: Secondary | ICD-10-CM | POA: Insufficient documentation

## 2023-03-22 DIAGNOSIS — F17210 Nicotine dependence, cigarettes, uncomplicated: Secondary | ICD-10-CM | POA: Insufficient documentation

## 2023-03-22 DIAGNOSIS — Z803 Family history of malignant neoplasm of breast: Secondary | ICD-10-CM | POA: Diagnosis not present

## 2023-03-22 DIAGNOSIS — R109 Unspecified abdominal pain: Secondary | ICD-10-CM | POA: Insufficient documentation

## 2023-03-22 DIAGNOSIS — Z79899 Other long term (current) drug therapy: Secondary | ICD-10-CM | POA: Insufficient documentation

## 2023-03-22 DIAGNOSIS — N179 Acute kidney failure, unspecified: Secondary | ICD-10-CM | POA: Diagnosis not present

## 2023-03-22 DIAGNOSIS — R739 Hyperglycemia, unspecified: Secondary | ICD-10-CM | POA: Diagnosis not present

## 2023-03-22 LAB — CMP (CANCER CENTER ONLY)
ALT: 17 U/L (ref 0–44)
AST: 20 U/L (ref 15–41)
Albumin: 4.2 g/dL (ref 3.5–5.0)
Alkaline Phosphatase: 81 U/L (ref 38–126)
Anion gap: 4 — ABNORMAL LOW (ref 5–15)
BUN: 22 mg/dL (ref 8–23)
CO2: 28 mmol/L (ref 22–32)
Calcium: 9.4 mg/dL (ref 8.9–10.3)
Chloride: 106 mmol/L (ref 98–111)
Creatinine: 1.26 mg/dL — ABNORMAL HIGH (ref 0.61–1.24)
GFR, Estimated: >60 mL/min (ref >60)
Glucose, Bld: 141 mg/dL — ABNORMAL HIGH (ref 70–99)
Potassium: 4.2 mmol/L (ref 3.5–5.1)
Sodium: 138 mmol/L (ref 135–145)
Total Bilirubin: 0.4 mg/dL (ref 0.3–1.2)
Total Protein: 7.1 g/dL (ref 6.5–8.1)

## 2023-03-22 LAB — CBC WITH DIFFERENTIAL (CANCER CENTER ONLY)
Abs Immature Granulocytes: 0.01 10*3/uL (ref 0.00–0.07)
Basophils Absolute: 0.1 10*3/uL (ref 0.0–0.1)
Basophils Relative: 1 %
Eosinophils Absolute: 0.3 10*3/uL (ref 0.0–0.5)
Eosinophils Relative: 6 %
HCT: 42.4 % (ref 39.0–52.0)
Hemoglobin: 14.1 g/dL (ref 13.0–17.0)
Immature Granulocytes: 0 %
Lymphocytes Relative: 20 %
Lymphs Abs: 0.9 10*3/uL (ref 0.7–4.0)
MCH: 29.6 pg (ref 26.0–34.0)
MCHC: 33.3 g/dL (ref 30.0–36.0)
MCV: 88.9 fL (ref 80.0–100.0)
Monocytes Absolute: 0.3 10*3/uL (ref 0.1–1.0)
Monocytes Relative: 7 %
Neutro Abs: 2.9 10*3/uL (ref 1.7–7.7)
Neutrophils Relative %: 66 %
Platelet Count: 206 10*3/uL (ref 150–400)
RBC: 4.77 MIL/uL (ref 4.22–5.81)
RDW: 13.4 % (ref 11.5–15.5)
WBC Count: 4.4 10*3/uL (ref 4.0–10.5)
nRBC: 0 % (ref 0.0–0.2)

## 2023-03-22 LAB — CEA (ACCESS): CEA (CHCC): 1.5 ng/mL (ref 0.00–5.00)

## 2023-03-22 NOTE — Progress Notes (Signed)
[facility] [phone]   [phone]  PROGRESS NOTE  Patient Care Team: Pcp, No as PCP - General Jaci Standard, MD as Consulting Physician (Medical Oncology) Marily Lente, RN as Oncology Nurse Navigator (Oncology)  Hematological/Oncological History # Rectal Adenocarcinoma, T4N2M0. Stage IIIc, In Remission 01/16/2021: presented to the emergency department with abdominal pain. CT abdomen/pelvis showed concern for mass lesion with perforation and surrounding adenopathy. 01/19/2021: repeat CT scan showed worsening inflammatory changes around the complex rectal process 01/21/2021: flexible sigmoidoscopy performed, lesion at 12 cm from the anal verge.  biopsy showed concern for adenocarcinoma. 01/21/2021: patient underwent a laparoscopic assisted loop colostomy with Dr. Corliss Skains. 02/09/2021: establish care with Dr. Leonides Schanz  03/05/2021: Cycle 1 Day 1 of neoadjuvant FOLFOX 03/18/2021: Cycle 2 Day 1 of neoadjuvant FOLFOX 04/02/2021: Cycle 3 Day 1 of neoadjuvant FOLFOX 04/15/2021: Cycle 4 Day 1 of neoadjuvant FOLFOX 04/30/2021: Cycle 5 Day 1 of neoadjuvant FOLFOX 05/14/2021: Cycle 6 Day 1 of neoadjuvant FOLFOX 05/27/2021: Cycle 7 Day 1 of neoadjuvant FOLFOX 06/10/2021: Cycle 8 Day 1 of neoadjuvant FOLFOX delayed due to neutropenia.  06/22/2021: Cycle 8 Day 1 of neoadjuvant FOLFOX 07/13/2021: start of Chemoradiation with Xeloda 825 mg/m2 BID 08/21/2021: Last day of chemoradiation 09/09/2021: pre-surgical colonoscopy performed, no concerning abnormalities.  11/04/2021: LAR/DLI at [facility] with Dr. Luciano Cutter 03/17/2022: Loop ileostomy takedown.   Interval History:  Curtis [name] 67 y.o. male with medical history significant for rectal adenocarcinoma who presents for a follow up visit. The patient's last visit was on 12/09/2022. In the interim since last visit he has had no major changes in his health.  On exam today Curtis [name] reports he has been very well in the interim  since her last visit.  His energy levels are strong and he is able to work 4 days/week.  He notes that he does have some difficulty with stools immediately after eating and therefore he does not eat or drink much while at work.  He reports that he was very nervous today and that is why his blood pressure was so elevated.  He reports that he has not had any trouble with recent infection such as runny nose, sore throat, or cough.  He is not having any abdominal pain or any signs of bleeding in the stool.  He denies any dark stools.Marland Kitchen  He currently denies any nausea, vomiting.  He denies any fevers, chills, night sweats, shortness of breath, chest pain, cough, neuropathy, mucositis or rash. A full 10 point ROS is listed below.  MEDICAL HISTORY:  Past Medical History:  Diagnosis Date   History of kidney stones    Rectosigmoid cancer (HCC)     SURGICAL HISTORY: Past Surgical History:  Procedure Laterality Date   BIOPSY  01/21/2021   Procedure: BIOPSY;  Surgeon: Napoleon Form, MD;  Location: [facility] ENDOSCOPY;  Service: Endoscopy;;   COLON RESECTION N/A 01/21/2021   Procedure: LAPAROSCOPIC ASSISTED LOOP COLOSTOMY;  Surgeon: Manus Rudd, MD;  Location: [facility] OR;  Service: General;  Laterality: N/A;   COLON RESECTION SIGMOID  11/04/2021   CYSTOSCOPY W/ URETERAL STENT PLACEMENT Right 01/03/2022   Procedure: CYSTOSCOPY WITH  URETERAL STENT PLACEMENT;  Surgeon: Belva Agee, MD;  Location: [facility] OR;  Service: Urology;  Laterality: Right;   CYSTOSCOPY/URETEROSCOPY/HOLMIUM LASER/STENT PLACEMENT Right 02/09/2022   Procedure: CYSTOSCOPY/URETEROSCOPY/STENT EXCHANGE;  Surgeon: Belva Agee, MD;  Location: [facility] ORS;  Service: Urology;  Laterality: Right;  30 MINUTES   FLEXIBLE SIGMOIDOSCOPY N/A 01/21/2021  Procedure: FLEXIBLE SIGMOIDOSCOPY;  Surgeon: Napoleon Form, MD;  Location: [facility] ENDOSCOPY;  Service: Endoscopy;  Laterality: N/A;   IR IMAGING GUIDED PORT INSERTION  03/04/2021   IR REMOVAL TUN  ACCESS W/ PORT W/O FL MOD SED  04/21/2022   SUBMUCOSAL TATTOO INJECTION  01/21/2021   Procedure: SUBMUCOSAL TATTOO INJECTION;  Surgeon: Napoleon Form, MD;  Location: [facility] ENDOSCOPY;  Service: Endoscopy;;    SOCIAL HISTORY: Social History   Socioeconomic History   Marital status: Divorced    Spouse name: Not on file   Number of children: Not on file   Years of education: Not on file   Highest education level: Not on file  Occupational History   Not on file  Tobacco Use   Smoking status: Some Days    Current packs/day: 0.00    Types: Cigarettes    Last attempt to quit: 01/05/2022    Years since quitting: 1.2   Smokeless tobacco: Never  Vaping Use   Vaping status: Never Used  Substance and Sexual Activity   Alcohol use: Never   Drug use: Never   Sexual activity: Never  Other Topics Concern   Not on file  Social History Narrative   Not on file   Social Determinants of Health   Financial Resource Strain: Low Risk  (02/09/2021)   Overall Financial Resource Strain (CARDIA)    Difficulty of Paying Living Expenses: Not very hard  Food Insecurity: No Food Insecurity (02/09/2021)   Hunger Vital Sign    Worried About Running Out of Food in the Last Year: Never true    Ran Out of Food in the Last Year: Never true  Transportation Needs: No Transportation Needs (11/06/2021)   [facility]   PRAPARE - Transportation    In the past 12 months, has lack of transportation kept you from medical appointments or from getting medications?: No    Lack of Transportation (Non-Medical): No  Physical Activity: Not on file  Stress: Not on file  Social Connections: Not on file  Intimate Partner Violence: Not At Risk (02/26/2021)   Humiliation, Afraid, Rape, and Kick questionnaire    Fear of Current or Ex-Partner: No    Emotionally Abused: No    Physically Abused: No    Sexually Abused: No    FAMILY HISTORY: Family History   Problem Relation Age of Onset   Hypertension Mother    Hypertension Father    Breast cancer Sister     ALLERGIES:  is allergic to cefazolin, leucovorin, and oxaliplatin.  MEDICATIONS:  Current Outpatient Medications  Medication Sig Dispense Refill   acetaminophen (TYLENOL) 500 MG tablet Take 1,000 mg by mouth every 8 (eight) hours as needed for headache or moderate pain.     loperamide (IMODIUM) 2 MG capsule Take 4 mg by mouth daily as needed for diarrhea or loose stools. Up to 6 /day     No current facility-administered medications for this visit.    REVIEW OF SYSTEMS:   Constitutional: ( - ) fevers, ( - )  chills , ( - ) night sweats Eyes: ( - ) blurriness of vision, ( - ) double vision, ( - ) watery eyes Ears, nose, mouth, throat, and face: ( - ) mucositis, ( - ) sore throat Respiratory: ( - ) cough, ( - ) dyspnea, ( - ) wheezes Cardiovascular: ( - ) palpitation, ( - ) chest discomfort, ( - ) lower extremity swelling Gastrointestinal:  ( - )  nausea, ( - ) heartburn, ( - ) change in bowel habits Skin: ( - ) abnormal skin rashes Lymphatics: ( - ) new lymphadenopathy, ( - ) easy bruising Neurological: ( - ) numbness, ( - ) tingling, ( - ) new weaknesses Behavioral/Psych: ( - ) mood change, ( - ) new changes  All other systems were reviewed with the patient and are negative.  PHYSICAL EXAMINATION: ECOG PERFORMANCE STATUS: 1 - Symptomatic but completely ambulatory  Vitals:   03/22/23 1020  BP: (!) 197/88  Pulse: (!) 58  Resp: 16  Temp: 97.7 F (36.5 C)  SpO2: 98%      Filed Weights   03/22/23 1020  Weight: 216 lb 14.4 oz (98.4 kg)       GENERAL: Well-appearing middle-aged [place] male, alert, no distress and comfortable SKIN: skin color, texture, turgor are normal, no rashes or significant lesions EYES: conjunctiva are pink and non-injected, sclera clear LUNGS: clear to auscultation and percussion with normal breathing effort HEART: regular rate & rhythm and  no murmurs and no lower extremity edema ABDOMEN: soft, non-tender, non-distended, normal bowel sounds.   Musculoskeletal: no cyanosis of digits and no clubbing  PSYCH: alert & oriented x 3, fluent speech NEURO: no focal motor/sensory deficits  LABORATORY DATA:  I have reviewed the data as listed    Latest Ref Rng & Units 03/22/2023    9:45 AM 12/09/2022    9:58 AM 09/03/2022    9:49 AM  CBC  WBC 4.0 - 10.5 K/uL 4.4  6.9  5.4   Hemoglobin 13.0 - 17.0 g/dL 27.2  53.6  64.4   Hematocrit 39.0 - 52.0 % 42.4  41.3  39.2   Platelets 150 - 400 K/uL 206  217  211        Latest Ref Rng & Units 03/22/2023    9:45 AM 12/09/2022    9:58 AM 09/03/2022    9:49 AM  CMP  Glucose 70 - 99 mg/dL 034  742  595   BUN 8 - 23 mg/dL 22  22  20    Creatinine 0.61 - 1.24 mg/dL 6.38  7.56  4.33   Sodium 135 - 145 mmol/L 138  139  139   Potassium 3.5 - 5.1 mmol/L 4.2  4.0  3.9   Chloride 98 - 111 mmol/L 106  108  107   CO2 22 - 32 mmol/L 28  25  27    Calcium 8.9 - 10.3 mg/dL 9.4  8.8  9.1   Total Protein 6.5 - 8.1 g/dL 7.1  7.1  7.1   Total Bilirubin 0.3 - 1.2 mg/dL 0.4  0.3  0.4   Alkaline Phos 38 - 126 U/L 81  82  77   AST 15 - 41 U/L 20  15  14    ALT 0 - 44 U/L 17  13  11      RADIOGRAPHIC STUDIES: I have personally reviewed the radiological images as listed and agreed with the findings in the report: no evidence of metastatic diease in the chest. MRI abdomen does not show metastatic disease.   No results found.  ASSESSMENT & PLAN Caelum Rotan 67 y.o. male with medical history significant for rectal adenocarcinoma who presents for a follow up visit.   At this time the patient's findings are most consistent with localized rectal adenocarcinoma.  MRI of the pelvis confirmed this is a T4 N2 M0 stage IIIc cancer.  As such we will plan to proceed with neoadjuvant chemotherapy, followed by  chemoradiation, followed by reevaluation for consideration of transabdominal resection.  The initial treatment of  choice would be neoadjuvant FOLFOX chemotherapy for 8 cycles.  Once this is complete we will transition to chemoradiation with p.o. capecitabine therapy.  When these are complete we will restage for consideration of surgical resection.  # Rectal Adenocarcinoma, T4N2M0 Stage IIIc, in Remission.  --  staging with a CT scan of the chest and MRI abdomen/pelvis findings are consistent with localized disease.   --CEA modestly elevated at 17.44 on 02/09/2021 prior to start of therapy. --Given that this is adenocarcinoma of the rectum I would recommend proceeding with neoadjuvant chemotherapy, followed by chemoradiation, followed by definitive surgery. Case discussed at [facility] --patient completed 8 cycles of neoadjuvant FOLFOX --started chemoradiation therapy with Xeloda on 07/13/2021. Last treatment on 08/21/2021.  --last day of chemoradiation on 08/21/2021  Plan:   -- underwent LAR/DLI on 11/04/2021 at [facility] with Dr. Luciano Cutter.  Underwent ileostomy reversal on 03/17/2022 --plan for CT C/A/P q 6-12 months (x 5 years) per NCCN recommendations. Last scan on Feb 2024. Next CT scan due August 2024.  --continue clinic visits q 3 months with CEA q 3-6 months x 2 years, then q 6-12 months for a total of 5 years. --plan for colonoscopy x 1 year post-operatively (April 2024). Currently overdue.  Will reach out to gastroenterology today. -- labs today show WBC 4.4, hemoglobin 14.1, MCV 88.9, and platelets of 206.  Creatinine 1.26 with normal LFTs. --RTC in 12 weeks.   # AKI --Cr 1.26 today, stable --encourage hydration  # Hyperglyclemia-resolved --BG elevated, patient has no history of DM type II. Unclear why this occurred.  -- abnormal elevations occurred during chemotherapy treatment.  --off glipizide  --continue to monitor.   #Supportive Care -- chemotherapy education complete -- port removed on 04/21/2022 -- no pain medication required at this time.   Orders Placed This Encounter  Procedures   CT  CHEST ABDOMEN PELVIS W CONTRAST    Standing Status:   Future    Standing Expiration Date:   03/21/2024    Order Specific Question:   If indicated for the ordered procedure, I authorize the administration of contrast media per Radiology protocol    Answer:   Yes    Order Specific Question:   Does the patient have a contrast media/X-ray dye allergy?    Answer:   No    Order Specific Question:   Preferred imaging location?    Answer:   [facility]    Order Specific Question:   If indicated for the ordered procedure, I authorize the administration of oral contrast media per Radiology protocol    Answer:   Yes    All questions were answered. The patient knows to call the clinic with any problems, questions or concerns.  I have spent a total of 30 minutes minutes of face-to-face and non-face-to-face time, preparing to see the patient, obtaining and/or reviewing separately obtained history, performing a medically appropriate examination, counseling and educating the patient, ordering medications,  communicating with other health care professionals, documenting clinical information in the electronic health record, and care coordination.   Ulysees Barns, MD Department of Hematology/Oncology [facility] at [facility] Phone: [phone] Pager: [phone] Email: [email or web address]   03/22/2023 3:07 PM  Hofheinz RD, Allyson Sabal, Post S, Matzdorff A, Laechelt S, Aggie Cosier, Mller L, Link H, Moehler M, Gwynneth Albright, [place], Velva, Hipp M,  Chryl Heck, Gencer D, Kienle P, Burkholder I, Hochhaus A. Chemoradiotherapy with capecitabine versus fluorouracil for locally advanced rectal cancer: a randomised, multicentre, non-inferiority, phase 3 trial. Lancet Oncol. 2012 Jun;13(6):579-88.

## 2023-03-22 NOTE — Progress Notes (Deleted)
[facility] [phone]   [phone]  PROGRESS NOTE  Patient Care Team: Pcp, No as PCP - General Jaci Standard, MD as Consulting Physician (Medical Oncology) Marily Lente, RN as Oncology Nurse Navigator (Oncology)  Hematological/Oncological History # Rectal Adenocarcinoma, T4N2M0. Stage IIIc, In Remission 01/16/2021: presented to the emergency department with abdominal pain. CT abdomen/pelvis showed concern for mass lesion with perforation and surrounding adenopathy. 01/19/2021: repeat CT scan showed worsening inflammatory changes around the complex rectal process 01/21/2021: flexible sigmoidoscopy performed, lesion at 12 cm from the anal verge.  biopsy showed concern for adenocarcinoma. 01/21/2021: patient underwent a laparoscopic assisted loop colostomy with Dr. Corliss Skains. 02/09/2021: establish care with Dr. Leonides Schanz  03/05/2021: Cycle 1 Day 1 of neoadjuvant FOLFOX 03/18/2021: Cycle 2 Day 1 of neoadjuvant FOLFOX 04/02/2021: Cycle 3 Day 1 of neoadjuvant FOLFOX 04/15/2021: Cycle 4 Day 1 of neoadjuvant FOLFOX 04/30/2021: Cycle 5 Day 1 of neoadjuvant FOLFOX 05/14/2021: Cycle 6 Day 1 of neoadjuvant FOLFOX 05/27/2021: Cycle 7 Day 1 of neoadjuvant FOLFOX 06/10/2021: Cycle 8 Day 1 of neoadjuvant FOLFOX delayed due to neutropenia.  06/22/2021: Cycle 8 Day 1 of neoadjuvant FOLFOX 07/13/2021: start of Chemoradiation with Xeloda 825 mg/m2 BID 08/21/2021: Last day of chemoradiation 09/09/2021: pre-surgical colonoscopy performed, no concerning abnormalities.  11/04/2021: LAR/DLI at [facility] with Dr. Luciano Cutter 03/17/2022: Loop ileostomy takedown.   Interval History:  Curtis [name] 67 y.o. male with medical history significant for rectal adenocarcinoma who presents for a follow up visit. The patient's last visit was on 09/03/2022. In the interim since last visit he has had no major changes in his health.  On exam today Curtis [name] reports he has been doing excellently interim  since her last visit.  He is working 4 days a week and has excellent levels of energy.  He has he enjoys doing a full day of work but thinks you start slowing down because his age is advancing.  His weight is up to 216 pounds, up from 207 pounds in February and 192 pounds in November.  He reports that he does have some issues with his bowels running in the morning and reports that he sometimes feels like he finished but then stands up and has to continue.  He reports that his bowel movements are normal, solid brown stool.  He reports that he does need to schedule his repeat colonoscopy and will be doing that shortly.  He has received the information notification from the GI office.  Otherwise he is at his baseline level of health and has no questions concerns or complaints today.Marland Kitchen  He currently denies any nausea, vomiting.  He denies any fevers, chills, night sweats, shortness of breath, chest pain, cough, neuropathy, mucositis or rash. A full 10 point ROS is listed below.  MEDICAL HISTORY:  Past Medical History:  Diagnosis Date   History of kidney stones    Rectosigmoid cancer (HCC)     SURGICAL HISTORY: Past Surgical History:  Procedure Laterality Date   BIOPSY  01/21/2021   Procedure: BIOPSY;  Surgeon: Napoleon Form, MD;  Location: [facility];  Service: Endoscopy;;   COLON RESECTION N/A 01/21/2021   Procedure: LAPAROSCOPIC ASSISTED LOOP COLOSTOMY;  Surgeon: Manus Rudd, MD;  Location: [facility] OR;  Service: General;  Laterality: N/A;   COLON RESECTION SIGMOID  11/04/2021   CYSTOSCOPY W/ URETERAL STENT PLACEMENT Right 01/03/2022   Procedure: CYSTOSCOPY WITH  URETERAL STENT PLACEMENT;  Surgeon: Belva Agee, MD;  Location: [facility] OR;  Service:  Urology;  Laterality: Right;   CYSTOSCOPY/URETEROSCOPY/HOLMIUM LASER/STENT PLACEMENT Right 02/09/2022   Procedure: CYSTOSCOPY/URETEROSCOPY/STENT EXCHANGE;  Surgeon: Belva Agee, MD;  Location: [facility];  Service: Urology;  Laterality: Right;  30  MINUTES   FLEXIBLE SIGMOIDOSCOPY N/A 01/21/2021   Procedure: FLEXIBLE SIGMOIDOSCOPY;  Surgeon: Napoleon Form, MD;  Location: [facility];  Service: Endoscopy;  Laterality: N/A;   IR IMAGING GUIDED PORT INSERTION  03/04/2021   IR REMOVAL TUN ACCESS W/ PORT W/O FL MOD SED  04/21/2022   SUBMUCOSAL TATTOO INJECTION  01/21/2021   Procedure: SUBMUCOSAL TATTOO INJECTION;  Surgeon: Napoleon Form, MD;  Location: [facility];  Service: Endoscopy;;    SOCIAL HISTORY: Social History   Socioeconomic History   Marital status: Divorced    Spouse name: Not on file   Number of children: Not on file   Years of education: Not on file   Highest education level: Not on file  Occupational History   Not on file  Tobacco Use   Smoking status: Some Days    Current packs/day: 0.00    Types: Cigarettes    Last attempt to quit: 01/05/2022    Years since quitting: 1.2   Smokeless tobacco: Never  Vaping Use   Vaping status: Never Used  Substance and Sexual Activity   Alcohol use: Never   Drug use: Never   Sexual activity: Never  Other Topics Concern   Not on file  Social History Narrative   Not on file   Social Determinants of Health   Financial Resource Strain: Low Risk  (02/09/2021)   Overall Financial Resource Strain (CARDIA)    Difficulty of Paying Living Expenses: Not very hard  Food Insecurity: No Food Insecurity (02/09/2021)   Hunger Vital Sign    Worried About Running Out of Food in the Last Year: Never true    Ran Out of Food in the Last Year: Never true  Transportation Needs: No Transportation Needs (11/06/2021)   Received from [facility]   PRAPARE - Transportation    In the past 12 months, has lack of transportation kept you from medical appointments or from getting medications?: No    Lack of Transportation (Non-Medical): No  Physical Activity: Not on file  Stress: Not on file  Social Connections: Not on file  Intimate  Partner Violence: Not At Risk (02/26/2021)   Humiliation, Afraid, Rape, and Kick questionnaire    Fear of Current or Ex-Partner: No    Emotionally Abused: No    Physically Abused: No    Sexually Abused: No    FAMILY HISTORY: Family History  Problem Relation Age of Onset   Hypertension Mother    Hypertension Father    Breast cancer Sister     ALLERGIES:  is allergic to cefazolin, leucovorin, and oxaliplatin.  MEDICATIONS:  Current Outpatient Medications  Medication Sig Dispense Refill   acetaminophen (TYLENOL) 500 MG tablet Take 1,000 mg by mouth every 8 (eight) hours as needed for headache or moderate pain.     loperamide (IMODIUM) 2 MG capsule Take 4 mg by mouth daily as needed for diarrhea or loose stools. Up to 6 /day     No current facility-administered medications for this visit.    REVIEW OF SYSTEMS:   Constitutional: ( - ) fevers, ( - )  chills , ( - ) night sweats Eyes: ( - ) blurriness of vision, ( - ) double vision, ( - ) watery eyes Ears, nose, mouth, throat,  and face: ( - ) mucositis, ( - ) sore throat Respiratory: ( - ) cough, ( - ) dyspnea, ( - ) wheezes Cardiovascular: ( - ) palpitation, ( - ) chest discomfort, ( - ) lower extremity swelling Gastrointestinal:  ( - ) nausea, ( - ) heartburn, ( - ) change in bowel habits Skin: ( - ) abnormal skin rashes Lymphatics: ( - ) new lymphadenopathy, ( - ) easy bruising Neurological: ( - ) numbness, ( - ) tingling, ( - ) new weaknesses Behavioral/Psych: ( - ) mood change, ( - ) new changes  All other systems were reviewed with the patient and are negative.  PHYSICAL EXAMINATION: ECOG PERFORMANCE STATUS: 1 - Symptomatic but completely ambulatory  There were no vitals filed for this visit.    There were no vitals filed for this visit.     GENERAL: Well-appearing middle-aged [place] male, alert, no distress and comfortable SKIN: skin color, texture, turgor are normal, no rashes or significant lesions EYES:  conjunctiva are pink and non-injected, sclera clear LUNGS: clear to auscultation and percussion with normal breathing effort HEART: regular rate & rhythm and no murmurs and no lower extremity edema ABDOMEN: soft, non-tender, non-distended, normal bowel sounds.   Musculoskeletal: no cyanosis of digits and no clubbing  PSYCH: alert & oriented x 3, fluent speech NEURO: no focal motor/sensory deficits  LABORATORY DATA:  I have reviewed the data as listed    Latest Ref Rng & Units 12/09/2022    9:58 AM 09/03/2022    9:49 AM 06/03/2022    9:40 AM  CBC  WBC 4.0 - 10.5 K/uL 6.9  5.4  5.0   Hemoglobin 13.0 - 17.0 g/dL 78.2  95.6  21.3   Hematocrit 39.0 - 52.0 % 41.3  39.2  36.9   Platelets 150 - 400 K/uL 217  211  229        Latest Ref Rng & Units 12/09/2022    9:58 AM 09/03/2022    9:49 AM 06/03/2022    9:40 AM  CMP  Glucose 70 - 99 mg/dL 086  578  469   BUN 8 - 23 mg/dL 22  20  28    Creatinine 0.61 - 1.24 mg/dL 6.29  5.28  4.13   Sodium 135 - 145 mmol/L 139  139  138   Potassium 3.5 - 5.1 mmol/L 4.0  3.9  4.3   Chloride 98 - 111 mmol/L 108  107  108   CO2 22 - 32 mmol/L 25  27  24    Calcium 8.9 - 10.3 mg/dL 8.8  9.1  9.0   Total Protein 6.5 - 8.1 g/dL 7.1  7.1  7.1   Total Bilirubin 0.3 - 1.2 mg/dL 0.3  0.4  0.3   Alkaline Phos 38 - 126 U/L 82  77  80   AST 15 - 41 U/L 15  14  13    ALT 0 - 44 U/L 13  11  10      RADIOGRAPHIC STUDIES: I have personally reviewed the radiological images as listed and agreed with the findings in the report: no evidence of metastatic diease in the chest. MRI abdomen does not show metastatic disease.   No results found.  ASSESSMENT & PLAN Curtis [name] 67 y.o. male with medical history significant for rectal adenocarcinoma who presents for a follow up visit.   At this time the patient's findings are most consistent with localized rectal adenocarcinoma.  MRI of the pelvis confirmed this is a T4  N2 M0 stage IIIc cancer.  As such we will plan to proceed with  neoadjuvant chemotherapy, followed by chemoradiation, followed by reevaluation for consideration of transabdominal resection.  The initial treatment of choice would be neoadjuvant FOLFOX chemotherapy for 8 cycles.  Once this is complete we will transition to chemoradiation with p.o. capecitabine therapy.  When these are complete we will restage for consideration of surgical resection.  # Rectal Adenocarcinoma, T4N2M0 Stage IIIc, in Remission.  --  staging with a CT scan of the chest and MRI abdomen/pelvis findings are consistent with localized disease.   --CEA modestly elevated at 17.44 on 02/09/2021 prior to start of therapy. --Given that this is adenocarcinoma of the rectum I would recommend proceeding with neoadjuvant chemotherapy, followed by chemoradiation, followed by definitive surgery. Case discussed at [facility] --patient completed 8 cycles of neoadjuvant FOLFOX --started chemoradiation therapy with Xeloda on 07/13/2021. Last treatment on 08/21/2021.  --last day of chemoradiation on 08/21/2021  Plan:   -- underwent LAR/DLI on 11/04/2021 at [facility] with Dr. Luciano Cutter.  Underwent ileostomy reversal on 03/17/2022 --plan for CT C/A/P q 6 months per NCCN recommendations. Last scan on 01/03/2022. Next CT scan due August 2024.  --continue clinic visits q 3 months with CEA q 3-6 months x 2 years, then q 6-12 months for a total of 5 years. --plan for colonoscopy x 1 year post-operatively (April 2024). Currently due.  -- labs today show WBC 6.9, Hgb 14.0, MCV 88.1, Plt 217 --RTC in 12 weeks.   # AKI --Cr 1.40 today, stable --encourage hydration  # Hyperglyclemia-resolved --BG elevated, patient has no history of DM type II. Unclear why this occurred.  -- abnormal elevations occurred during chemotherapy treatment.  --off glipizide  --continue to monitor.   #Supportive Care -- chemotherapy education complete -- port removed on 04/21/2022 -- no pain medication required at this time.   No orders  of the defined types were placed in this encounter.   All questions were answered. The patient knows to call the clinic with any problems, questions or concerns.  I have spent a total of 30 minutes minutes of face-to-face and non-face-to-face time, preparing to see the patient, obtaining and/or reviewing separately obtained history, performing a medically appropriate examination, counseling and educating the patient, ordering medications,  communicating with other health care professionals, documenting clinical information in the electronic health record, and care coordination.   Ulysees Barns, MD Department of Hematology/Oncology [facility] at [facility] Phone: [phone] Pager: [phone] Email: [email or web address]   03/22/2023 7:38 AM  Hofheinz RD, Allyson Sabal, Post S, Matzdorff A, Laechelt S, Aggie Cosier, Mller L, Link H, Moehler M, Gwynneth Albright, [place], [place], [place], Hipp M, Hartung G, Gencer D, [place], Rouse I, Hochhaus A. Chemoradiotherapy with capecitabine versus fluorouracil for locally advanced rectal cancer: a randomised, multicentre, non-inferiority, phase 3 trial. Lancet Oncol. 2012 Jun;13(6):579-88.

## 2023-03-29 ENCOUNTER — Encounter: Payer: Self-pay | Admitting: Hematology and Oncology

## 2023-04-01 ENCOUNTER — Ambulatory Visit (HOSPITAL_COMMUNITY)
Admission: RE | Admit: 2023-04-01 | Discharge: 2023-04-01 | Disposition: A | Discharge status: Home / Self Care | Payer: Medicare HMO | Source: Ambulatory Visit | Attending: Hematology and Oncology | Admitting: Hematology and Oncology

## 2023-04-01 DIAGNOSIS — C20 Malignant neoplasm of rectum: Secondary | ICD-10-CM | POA: Diagnosis not present

## 2023-04-01 DIAGNOSIS — I700 Atherosclerosis of aorta: Secondary | ICD-10-CM | POA: Diagnosis not present

## 2023-04-01 MED ORDER — IOHEXOL 300 MG/ML  SOLN
100.0000 mL | Freq: Once | INTRAMUSCULAR | Status: AC | PRN
  Administered 2023-04-01: 100 mL via INTRAVENOUS

## 2023-04-01 MED ORDER — SODIUM CHLORIDE (PF) 0.9 % IJ SOLN
INTRAMUSCULAR | Status: AC
  Filled 2023-04-01: qty 50

## 2023-04-04 ENCOUNTER — Telehealth: Payer: Self-pay | Admitting: *Deleted

## 2023-04-04 NOTE — Telephone Encounter (Signed)
-----   Message from Ulysees Barns IV sent at 04/04/2023 11:24 AM EDT ----- Please let Mr. Riede know that his CT scan showed no evidence of recurrent diease. He remains in remission. We'll see him back in 3 months to re-evaluate. ----- Message ----- From: Interface, Rad Results In Sent: 04/04/2023  11:23 AM EDT To: Jaci Standard, MD

## 2023-04-04 NOTE — Telephone Encounter (Signed)
TCT patient regarding recent scan results. Spoke with him. Advised that his CT scan showed no evidence of recurrent diease. He remains in remission. We'll see him back in 3 months to re-evaluate. Pt very happy with these results. He understands to come back in 3 months.

## 2023-06-08 ENCOUNTER — Inpatient Hospital Stay: Payer: Medicare HMO | Attending: Hematology and Oncology

## 2023-06-08 ENCOUNTER — Other Ambulatory Visit: Payer: Self-pay | Admitting: Hematology and Oncology

## 2023-06-08 ENCOUNTER — Inpatient Hospital Stay: Payer: Medicare HMO | Admitting: Hematology and Oncology

## 2023-06-08 VITALS — BP 195/93 | HR 68 | Temp 97.7°F | Resp 13

## 2023-06-08 DIAGNOSIS — Z79899 Other long term (current) drug therapy: Secondary | ICD-10-CM | POA: Insufficient documentation

## 2023-06-08 DIAGNOSIS — Z8249 Family history of ischemic heart disease and other diseases of the circulatory system: Secondary | ICD-10-CM | POA: Insufficient documentation

## 2023-06-08 DIAGNOSIS — Z803 Family history of malignant neoplasm of breast: Secondary | ICD-10-CM | POA: Diagnosis not present

## 2023-06-08 DIAGNOSIS — Z95828 Presence of other vascular implants and grafts: Secondary | ICD-10-CM

## 2023-06-08 DIAGNOSIS — F17210 Nicotine dependence, cigarettes, uncomplicated: Secondary | ICD-10-CM | POA: Diagnosis not present

## 2023-06-08 DIAGNOSIS — Z881 Allergy status to other antibiotic agents status: Secondary | ICD-10-CM | POA: Insufficient documentation

## 2023-06-08 DIAGNOSIS — Z87442 Personal history of urinary calculi: Secondary | ICD-10-CM | POA: Insufficient documentation

## 2023-06-08 DIAGNOSIS — C20 Malignant neoplasm of rectum: Secondary | ICD-10-CM | POA: Insufficient documentation

## 2023-06-08 DIAGNOSIS — N179 Acute kidney failure, unspecified: Secondary | ICD-10-CM | POA: Diagnosis not present

## 2023-06-08 DIAGNOSIS — Z923 Personal history of irradiation: Secondary | ICD-10-CM | POA: Diagnosis not present

## 2023-06-08 LAB — CBC WITH DIFFERENTIAL (CANCER CENTER ONLY)
Abs Immature Granulocytes: 0.01 10*3/uL (ref 0.00–0.07)
Basophils Absolute: 0 10*3/uL (ref 0.0–0.1)
Basophils Relative: 1 %
Eosinophils Absolute: 0.2 10*3/uL (ref 0.0–0.5)
Eosinophils Relative: 4 %
HCT: 42.9 % (ref 39.0–52.0)
Hemoglobin: 14.6 g/dL (ref 13.0–17.0)
Immature Granulocytes: 0 %
Lymphocytes Relative: 18 %
Lymphs Abs: 1.1 10*3/uL (ref 0.7–4.0)
MCH: 29.8 pg (ref 26.0–34.0)
MCHC: 34 g/dL (ref 30.0–36.0)
MCV: 87.6 fL (ref 80.0–100.0)
Monocytes Absolute: 0.4 10*3/uL (ref 0.1–1.0)
Monocytes Relative: 7 %
Neutro Abs: 4.2 10*3/uL (ref 1.7–7.7)
Neutrophils Relative %: 70 %
Platelet Count: 193 10*3/uL (ref 150–400)
RBC: 4.9 MIL/uL (ref 4.22–5.81)
RDW: 13 % (ref 11.5–15.5)
WBC Count: 5.9 10*3/uL (ref 4.0–10.5)
nRBC: 0 % (ref 0.0–0.2)

## 2023-06-08 LAB — CMP (CANCER CENTER ONLY)
ALT: 23 U/L (ref 0–44)
AST: 19 U/L (ref 15–41)
Albumin: 4.2 g/dL (ref 3.5–5.0)
Alkaline Phosphatase: 100 U/L (ref 38–126)
Anion gap: 4 — ABNORMAL LOW (ref 5–15)
BUN: 22 mg/dL (ref 8–23)
CO2: 27 mmol/L (ref 22–32)
Calcium: 9.6 mg/dL (ref 8.9–10.3)
Chloride: 105 mmol/L (ref 98–111)
Creatinine: 1.27 mg/dL — ABNORMAL HIGH (ref 0.61–1.24)
GFR, Estimated: >60 mL/min (ref >60)
Glucose, Bld: 238 mg/dL — ABNORMAL HIGH (ref 70–99)
Potassium: 3.9 mmol/L (ref 3.5–5.1)
Sodium: 136 mmol/L (ref 135–145)
Total Bilirubin: 0.4 mg/dL (ref <1.2)
Total Protein: 7.3 g/dL (ref 6.5–8.1)

## 2023-06-08 MED ORDER — AMLODIPINE BESYLATE 5 MG PO TABS: 5.0000 mg | ORAL_TABLET | Freq: Every day | ORAL | 1 refills | Status: DC

## 2023-06-08 NOTE — Progress Notes (Signed)
[facility] [phone]   [phone]  PROGRESS NOTE  Patient Care Team: Pcp, No as PCP - General Curtis Standard, MD as Consulting Physician (Medical Oncology) Curtis Lente, RN (Inactive) as Oncology Nurse Navigator (Oncology)  Hematological/Oncological History # Rectal Adenocarcinoma, T4N2M0. Stage IIIc, In Remission 01/16/2021: presented to the emergency department with abdominal pain. CT abdomen/pelvis showed concern for mass lesion with perforation and surrounding adenopathy. 01/19/2021: repeat CT scan showed worsening inflammatory changes around the complex rectal process 01/21/2021: flexible sigmoidoscopy performed, lesion at 12 cm from the anal verge.  biopsy showed concern for adenocarcinoma. 01/21/2021: patient underwent a laparoscopic assisted loop colostomy with Curtis [name]. 02/09/2021: establish care with Curtis [name]  03/05/2021: Cycle 1 Day 1 of neoadjuvant FOLFOX 03/18/2021: Cycle 2 Day 1 of neoadjuvant FOLFOX 04/02/2021: Cycle 3 Day 1 of neoadjuvant FOLFOX 04/15/2021: Cycle 4 Day 1 of neoadjuvant FOLFOX 04/30/2021: Cycle 5 Day 1 of neoadjuvant FOLFOX 05/14/2021: Cycle 6 Day 1 of neoadjuvant FOLFOX 05/27/2021: Cycle 7 Day 1 of neoadjuvant FOLFOX 06/10/2021: Cycle 8 Day 1 of neoadjuvant FOLFOX delayed due to neutropenia.  06/22/2021: Cycle 8 Day 1 of neoadjuvant FOLFOX 07/13/2021: start of Chemoradiation with Xeloda 825 mg/m2 BID 08/21/2021: Last day of chemoradiation 09/09/2021: pre-surgical colonoscopy performed, no concerning abnormalities.  11/04/2021: LAR/DLI at [facility] with Dr. Luciano [name] 03/17/2022: Loop ileostomy takedown.   Interval History:  Curtis [name] 67 y.o. male with medical history significant for rectal adenocarcinoma who presents for a follow up visit. The patient's last visit was on 03/22/2023. In the interim since last visit he has had no major changes in his health.  On exam today [name] reports he has been well overall  and interim since her last visit.  He reports "everything is normal".  He notes his energy levels are good and he is using the bathroom well.  He reports he works 4 days a week approximately 8 to 8.5 hours/day.  This is full-time work.  He reports that his appetite is strong and he is eating quite well.  He is not having any diarrhea but does take Imodium occasionally to control the frequency of his stools.  He notes he is drinking plenty of water and juice and is not having any dark bowel movements.  He reports he is looking forward to cooking a 26 pound [place] for Thanksgiving this year.  He denies any fevers, chills, night sweats, shortness of breath, chest pain, cough, neuropathy, mucositis or rash. A full 10 point ROS is listed below.  MEDICAL HISTORY:  Past Medical History:  Diagnosis Date   History of kidney stones    Rectosigmoid cancer (HCC)     SURGICAL HISTORY: Past Surgical History:  Procedure Laterality Date   BIOPSY  01/21/2021   Procedure: BIOPSY;  Surgeon: Curtis Form, MD;  Location: [facility];  Service: Endoscopy;;   COLON RESECTION N/A 01/21/2021   Procedure: LAPAROSCOPIC ASSISTED LOOP COLOSTOMY;  Surgeon: Curtis Rudd, MD;  Location: [facility] OR;  Service: General;  Laterality: N/A;   COLON RESECTION SIGMOID  11/04/2021   CYSTOSCOPY W/ URETERAL STENT PLACEMENT Right 01/03/2022   Procedure: CYSTOSCOPY WITH  URETERAL STENT PLACEMENT;  Surgeon: Curtis Agee, MD;  Location: [facility] OR;  Service: Urology;  Laterality: Right;   CYSTOSCOPY/URETEROSCOPY/HOLMIUM LASER/STENT PLACEMENT Right 02/09/2022   Procedure: CYSTOSCOPY/URETEROSCOPY/STENT EXCHANGE;  Surgeon: Curtis Agee, MD;  Location: [facility] ORS;  Service: Urology;  Laterality: Right;  30 MINUTES   FLEXIBLE SIGMOIDOSCOPY N/A 01/21/2021  Procedure: FLEXIBLE SIGMOIDOSCOPY;  Surgeon: Curtis Form, MD;  Location: [facility];  Service: Endoscopy;  Laterality: N/A;   IR IMAGING GUIDED PORT INSERTION  03/04/2021   IR  REMOVAL TUN ACCESS W/ PORT W/O FL MOD SED  04/21/2022   SUBMUCOSAL TATTOO INJECTION  01/21/2021   Procedure: SUBMUCOSAL TATTOO INJECTION;  Surgeon: Curtis Form, MD;  Location: [facility];  Service: Endoscopy;;    SOCIAL HISTORY: Social History   Socioeconomic History   Marital status: Divorced    Spouse name: Not on file   Number of children: Not on file   Years of education: Not on file   Highest education level: Not on file  Occupational History   Not on file  Tobacco Use   Smoking status: Some Days    Current packs/day: 0.00    Types: Cigarettes    Last attempt to quit: 01/05/2022    Years since quitting: 1.4   Smokeless tobacco: Never  Vaping Use   Vaping status: Never Used  Substance and Sexual Activity   Alcohol use: Never   Drug use: Never   Sexual activity: Never  Other Topics Concern   Not on file  Social History Narrative   Not on file   Social Determinants of Health   Financial Resource Strain: Low Risk  (02/09/2021)   Overall Financial Resource Strain (CARDIA)    Difficulty of Paying Living Expenses: Not very hard  Food Insecurity: No Food Insecurity (02/09/2021)   Hunger Vital Sign    Worried About Running Out of Food in the Last Year: Never true    Ran Out of Food in the Last Year: Never true  Transportation Needs: No Transportation Needs (11/06/2021)   Received [facility], [facility]   PRAPARE - Transportation    In the past 12 months, has lack of transportation kept you from medical appointments or from getting medications?: No    Lack of Transportation (Non-Medical): No  Physical Activity: Not on file  Stress: Not on file  Social Connections: Not on file  Intimate Partner Violence: Not At Risk (02/26/2021)   Humiliation, Afraid, Rape, and Kick questionnaire    Fear of Current or Ex-Partner: No    Emotionally Abused: No    Physically Abused: No    Sexually Abused: No    FAMILY HISTORY: Family  History  Problem Relation Age of Onset   Hypertension Mother    Hypertension Father    Breast cancer Sister     ALLERGIES:  is allergic to cefazolin, leucovorin, and oxaliplatin.  MEDICATIONS:  Current Outpatient Medications  Medication Sig Dispense Refill   amLODipine (NORVASC) 5 MG tablet Take 1 tablet (5 mg total) by mouth daily. 90 tablet 1   acetaminophen (TYLENOL) 500 MG tablet Take 1,000 mg by mouth every 8 (eight) hours as needed for headache or moderate pain.     loperamide (IMODIUM) 2 MG capsule Take 4 mg by mouth daily as needed for diarrhea or loose stools. Up to 6 /day     No current facility-administered medications for this visit.    REVIEW OF SYSTEMS:   Constitutional: ( - ) fevers, ( - )  chills , ( - ) night sweats Eyes: ( - ) blurriness of vision, ( - ) double vision, ( - ) watery eyes Ears, nose, mouth, throat, and face: ( - ) mucositis, ( - ) sore throat Respiratory: ( - ) cough, ( - ) dyspnea, ( - ) wheezes  Cardiovascular: ( - ) palpitation, ( - ) chest discomfort, ( - ) lower extremity swelling Gastrointestinal:  ( - ) nausea, ( - ) heartburn, ( - ) change in bowel habits Skin: ( - ) abnormal skin rashes Lymphatics: ( - ) new lymphadenopathy, ( - ) easy bruising Neurological: ( - ) numbness, ( - ) tingling, ( - ) new weaknesses Behavioral/Psych: ( - ) mood change, ( - ) new changes  All other systems were reviewed with the patient and are negative.  PHYSICAL EXAMINATION: ECOG PERFORMANCE STATUS: 1 - Symptomatic but completely ambulatory  Vitals:   06/08/23 1006  BP: (!) 195/93  Pulse: 68  Resp: 13  Temp: 97.7 F (36.5 C)  SpO2: 98%       There were no vitals filed for this visit.      GENERAL: Well-appearing middle-aged [place] male, alert, no distress and comfortable SKIN: skin color, texture, turgor are normal, no rashes or significant lesions EYES: conjunctiva are pink and non-injected, sclera clear LUNGS: clear to auscultation and  percussion with normal breathing effort HEART: regular rate & rhythm and no murmurs and no lower extremity edema ABDOMEN: soft, non-tender, non-distended, normal bowel sounds.   Musculoskeletal: no cyanosis of digits and no clubbing  PSYCH: alert & oriented x 3, fluent speech NEURO: no focal motor/sensory deficits  LABORATORY DATA:  I have reviewed the data as listed    Latest Ref Rng & Units 06/08/2023    9:49 AM 03/22/2023    9:45 AM 12/09/2022    9:58 AM  CBC  WBC 4.0 - 10.5 K/uL 5.9  4.4  6.9   Hemoglobin 13.0 - 17.0 g/dL 16.1  09.6  04.5   Hematocrit 39.0 - 52.0 % 42.9  42.4  41.3   Platelets 150 - 400 K/uL 193  206  217        Latest Ref Rng & Units 06/08/2023    9:49 AM 03/22/2023    9:45 AM 12/09/2022    9:58 AM  CMP  Glucose 70 - 99 mg/dL 409  811  914   BUN 8 - 23 mg/dL 22  22  22    Creatinine 0.61 - 1.24 mg/dL 7.82  9.56  2.13   Sodium 135 - 145 mmol/L 136  138  139   Potassium 3.5 - 5.1 mmol/L 3.9  4.2  4.0   Chloride 98 - 111 mmol/L 105  106  108   CO2 22 - 32 mmol/L 27  28  25    Calcium 8.9 - 10.3 mg/dL 9.6  9.4  8.8   Total Protein 6.5 - 8.1 g/dL 7.3  7.1  7.1   Total Bilirubin <1.2 mg/dL 0.4  0.4  0.3   Alkaline Phos 38 - 126 U/L 100  81  82   AST 15 - 41 U/L 19  20  15    ALT 0 - 44 U/L 23  17  13      RADIOGRAPHIC STUDIES: I have personally reviewed the radiological images as listed and agreed with the findings in the report: no evidence of metastatic diease in the chest. MRI abdomen does not show metastatic disease.   No results found.  ASSESSMENT & PLAN Sahil Macmillan 67 y.o. male with medical history significant for rectal adenocarcinoma who presents for a follow up visit.   At this time the patient's findings are most consistent with localized rectal adenocarcinoma.  MRI of the pelvis confirmed this is a T4 N2 M0 stage IIIc cancer.  As such we will plan to proceed with neoadjuvant chemotherapy, followed by chemoradiation, followed by reevaluation for  consideration of transabdominal resection.  The initial treatment of choice would be neoadjuvant FOLFOX chemotherapy for 8 cycles.  Once this is complete we will transition to chemoradiation with p.o. capecitabine therapy.  When these are complete we will restage for consideration of surgical resection.  # Rectal Adenocarcinoma, T4N2M0 Stage IIIc, in Remission.  --  staging with a CT scan of the chest and MRI abdomen/pelvis findings are consistent with localized disease.   --CEA modestly elevated at 17.44 on 02/09/2021 prior to start of therapy. --Given that this is adenocarcinoma of the rectum I would recommend proceeding with neoadjuvant chemotherapy, followed by chemoradiation, followed by definitive surgery. Case discussed at [facility] --patient completed 8 cycles of neoadjuvant FOLFOX --started chemoradiation therapy with Xeloda on 07/13/2021. Last treatment on 08/21/2021.  --last day of chemoradiation on 08/21/2021  Plan:   -- underwent LAR/DLI on 11/04/20[facility] at [facility] with Dr. Luciano [name].  Underwent ileostomy reversal on 03/17/2022 --plan for CT C/A/P q 6-12 months (x 5 years) per NCCN recommendations. Last scan on Sept 2024. Next CT scan due March 2025 --continue clinic visits q 3 months with CEA q 3-6 months x 2 years, then q 6-12 months for a total of 5 years. --plan for colonoscopy x 1 year post-operatively (April 2024). Currently overdue.  Contacted GI who noted they would schedule.  -- labs today show WBC 5.9, hemoglobin 14.6, MCV 87.6, platelets 193 with normal LFTs. --RTC in 12 weeks.   # AKI --Cr 1.27 today, stable --encourage hydration  # Hyperglyclemia-resolved --BG elevated, patient has no history of DM type II. Unclear why this occurred.  -- abnormal elevations occurred during chemotherapy treatment.  --off glipizide  --continue to monitor.   #Supportive Care -- chemotherapy education complete -- port removed on 04/21/2022 -- no pain medication required at this time.    Orders Placed This Encounter  Procedures   CT CHEST ABDOMEN PELVIS W CONTRAST    Standing Status:   Future    Standing Expiration Date:   06/07/2024    Order Specific Question:   If indicated for the ordered procedure, I authorize the administration of contrast media per Radiology protocol    Answer:   Yes    Order Specific Question:   Does the patient have a contrast media/X-ray dye allergy?    Answer:   No    Order Specific Question:   Preferred imaging location?    Answer:   [facility]    Order Specific Question:   If indicated for the ordered procedure, I authorize the administration of oral contrast media per Radiology protocol    Answer:   Yes    All questions were answered. The patient knows to call the clinic with any problems, questions or concerns.  I have spent a total of 30 minutes minutes of face-to-face and non-face-to-face time, preparing to see the patient, obtaining and/or reviewing separately obtained history, performing a medically appropriate examination, counseling and educating the patient, ordering medications,  communicating with other health care professionals, documenting clinical information in the electronic health record, and care coordination.   Ulysees Barns, MD Department of Hematology/Oncology [facility] at [facility] Phone: [phone] Pager: [phone] Email: [email or web address]   06/08/2023 11:17 AM  Hofheinz RD, Allyson Sabal, Post S, Matzdorff A, Laechelt S, Aggie Cosier, Mller L, Link H, Moehler M, Dorena Bodo [place],  Ernesta Amble, Hipp M, Hartung G, Gencer D, Kienle P, Anegam I, Hochhaus A. Chemoradiotherapy with capecitabine versus fluorouracil for locally advanced rectal cancer: a randomised, multicentre, non-inferiority, phase 3 trial. Lancet Oncol. 2012 Jun;13(6):579-88.

## 2023-08-08 ENCOUNTER — Encounter: Payer: Self-pay | Admitting: Hematology and Oncology

## 2023-08-31 ENCOUNTER — Encounter: Payer: Self-pay | Admitting: Hematology and Oncology

## 2023-09-06 ENCOUNTER — Telehealth: Payer: Self-pay

## 2023-09-06 ENCOUNTER — Other Ambulatory Visit: Payer: Self-pay | Admitting: Hematology and Oncology

## 2023-09-06 DIAGNOSIS — C20 Malignant neoplasm of rectum: Secondary | ICD-10-CM

## 2023-09-06 NOTE — Progress Notes (Unsigned)
[facility] [phone]   [phone]  PROGRESS NOTE  Patient Care Team: Pcp, No as PCP - General Jaci Standard, MD as Consulting Physician (Medical Oncology) Marily Lente, RN (Inactive) as Oncology Nurse Navigator (Oncology)  Hematological/Oncological History # Rectal Adenocarcinoma, T4N2M0. Stage IIIc, In Remission 01/16/2021: presented to the emergency department with abdominal pain. CT abdomen/pelvis showed concern for mass lesion with perforation and surrounding adenopathy. 01/19/2021: repeat CT scan showed worsening inflammatory changes around the complex rectal process 01/21/2021: flexible sigmoidoscopy performed, lesion at 12 cm from the anal verge.  biopsy showed concern for adenocarcinoma. 01/21/2021: patient underwent a laparoscopic assisted loop colostomy with Dr. Corliss Skains. 02/09/2021: establish care with Dr. Leonides Schanz  03/05/2021: Cycle 1 Day 1 of neoadjuvant FOLFOX 03/18/2021: Cycle 2 Day 1 of neoadjuvant FOLFOX 04/02/2021: Cycle 3 Day 1 of neoadjuvant FOLFOX 04/15/2021: Cycle 4 Day 1 of neoadjuvant FOLFOX 04/30/2021: Cycle 5 Day 1 of neoadjuvant FOLFOX 05/14/2021: Cycle 6 Day 1 of neoadjuvant FOLFOX 05/27/2021: Cycle 7 Day 1 of neoadjuvant FOLFOX 06/10/2021: Cycle 8 Day 1 of neoadjuvant FOLFOX delayed due to neutropenia.  06/22/2021: Cycle 8 Day 1 of neoadjuvant FOLFOX 07/13/2021: start of Chemoradiation with Xeloda 825 mg/m2 BID 08/21/2021: Last day of chemoradiation 09/09/2021: pre-surgical colonoscopy performed, no concerning abnormalities.  11/04/2021: LAR/DLI at [facility] with Dr. Luciano Cutter 03/17/2022: Loop ileostomy takedown.   Interval History:  Curtis [name] 68 y.o. male with medical history significant for rectal adenocarcinoma who presents for a follow up visit. The patient's last visit was on 06/08/2023. In the interim since last visit he has had no major changes in his health.  On exam today Curtis [name] reports he has had no major  changes in his health in the interim since our last visit.  He is still having some issues with bowel movements.  He reports that there is a good deal of urgency and when he goes it tends to be about 3 or 4 times in a row.  He reports when he goes to work he takes Imodium 2 pills which has been helpful.  He also tends to avoid eating at work and prefers to eat at night.  He thinks this has been bad for his weight gain.  He notes that overall he feels great with strong energy.  He notes he is not interested in retiring and would like to continue working.  He is working 4 days/week.  Reports that his bowel movements are normal in color with no dark stools or blood.  Overall he feels well with no questions concerns or complaints, other than the urgency.  He denies any fevers, chills, night sweats, shortness of breath, chest pain, cough, neuropathy, mucositis or rash. A full 10 point ROS is listed below.  MEDICAL HISTORY:  Past Medical History:  Diagnosis Date   History of kidney stones    Rectosigmoid cancer (HCC)     SURGICAL HISTORY: Past Surgical History:  Procedure Laterality Date   BIOPSY  01/21/2021   Procedure: BIOPSY;  Surgeon: Napoleon Form, MD;  Location: [facility];  Service: Endoscopy;;   COLON RESECTION N/A 01/21/2021   Procedure: LAPAROSCOPIC ASSISTED LOOP COLOSTOMY;  Surgeon: Manus Rudd, MD;  Location: [facility] OR;  Service: General;  Laterality: N/A;   COLON RESECTION SIGMOID  11/04/2021   CYSTOSCOPY W/ URETERAL STENT PLACEMENT Right 01/03/2022   Procedure: CYSTOSCOPY WITH  URETERAL STENT PLACEMENT;  Surgeon: Belva Agee, MD;  Location: [facility] OR;  Service: Urology;  Laterality: Right;  CYSTOSCOPY/URETEROSCOPY/HOLMIUM LASER/STENT PLACEMENT Right 02/09/2022   Procedure: CYSTOSCOPY/URETEROSCOPY/STENT EXCHANGE;  Surgeon: Belva Agee, MD;  Location: [facility] ORS;  Service: Urology;  Laterality: Right;  30 MINUTES   FLEXIBLE SIGMOIDOSCOPY N/A 01/21/2021   Procedure: FLEXIBLE  SIGMOIDOSCOPY;  Surgeon: Napoleon Form, MD;  Location: [facility];  Service: Endoscopy;  Laterality: N/A;   IR IMAGING GUIDED PORT INSERTION  03/04/2021   IR REMOVAL TUN ACCESS W/ PORT W/O FL MOD SED  04/21/2022   SUBMUCOSAL TATTOO INJECTION  01/21/2021   Procedure: SUBMUCOSAL TATTOO INJECTION;  Surgeon: Napoleon Form, MD;  Location: [facility];  Service: Endoscopy;;    SOCIAL HISTORY: Social History   Socioeconomic History   Marital status: Divorced    Spouse name: Not on file   Number of children: Not on file   Years of education: Not on file   Highest education level: Not on file  Occupational History   Not on file  Tobacco Use   Smoking status: Some Days    Current packs/day: 0.00    Types: Cigarettes    Last attempt to quit: 01/05/2022    Years since quitting: 1.6   Smokeless tobacco: Never  Vaping Use   Vaping status: Never Used  Substance and Sexual Activity   Alcohol use: Never   Drug use: Never   Sexual activity: Never  Other Topics Concern   Not on file  Social History Narrative   Not on file   Social Drivers of Health   Financial Resource Strain: Low Risk  (02/09/2021)   Overall Financial Resource Strain (CARDIA)    Difficulty of Paying Living Expenses: Not very hard  Food Insecurity: No Food Insecurity (02/09/2021)   Hunger Vital Sign    Worried About Running Out of Food in the Last Year: Never true    Ran Out of Food in the Last Year: Never true  Transportation Needs: No Transportation Needs (11/06/2021)   Received from [facility], [facility]   PRAPARE - Transportation    In the past 12 months, has lack of transportation kept you from medical appointments or from getting medications?: No    Lack of Transportation (Non-Medical): No  Physical Activity: Not on file  Stress: Not on file  Social Connections: Not on file  Intimate Partner Violence: Not At Risk (02/26/2021)   Humiliation, Afraid, Rape, and Kick  questionnaire    Fear of Current or Ex-Partner: No    Emotionally Abused: No    Physically Abused: No    Sexually Abused: No    FAMILY HISTORY: Family History  Problem Relation Age of Onset   Hypertension Mother    Hypertension Father    Breast cancer Sister     ALLERGIES:  is allergic to cefazolin, leucovorin, and oxaliplatin.  MEDICATIONS:  Current Outpatient Medications  Medication Sig Dispense Refill   acetaminophen (TYLENOL) 500 MG tablet Take 1,000 mg by mouth every 8 (eight) hours as needed for headache or moderate pain.     amLODipine (NORVASC) 5 MG tablet Take 1 tablet (5 mg total) by mouth daily. 90 tablet 1   loperamide (IMODIUM) 2 MG capsule Take 4 mg by mouth daily as needed for diarrhea or loose stools. Up to 6 /day     No current facility-administered medications for this visit.    REVIEW OF SYSTEMS:   Constitutional: ( - ) fevers, ( - )  chills , ( - ) night sweats Eyes: ( - ) blurriness of vision, ( - )  double vision, ( - ) watery eyes Ears, nose, mouth, throat, and face: ( - ) mucositis, ( - ) sore throat Respiratory: ( - ) cough, ( - ) dyspnea, ( - ) wheezes Cardiovascular: ( - ) palpitation, ( - ) chest discomfort, ( - ) lower extremity swelling Gastrointestinal:  ( - ) nausea, ( - ) heartburn, ( - ) change in bowel habits Skin: ( - ) abnormal skin rashes Lymphatics: ( - ) new lymphadenopathy, ( - ) easy bruising Neurological: ( - ) numbness, ( - ) tingling, ( - ) new weaknesses Behavioral/Psych: ( - ) mood change, ( - ) new changes  All other systems were reviewed with the patient and are negative.  PHYSICAL EXAMINATION: ECOG PERFORMANCE STATUS: 1 - Symptomatic but completely ambulatory  Vitals:   09/07/23 0927  BP: (!) 156/82  Pulse: 71  Resp: 14  Temp: 97.8 F (36.6 C)  SpO2: 100%        Filed Weights   09/07/23 0927  Weight: 214 lb 4.8 oz (97.2 kg)   GENERAL: Well-appearing middle-aged [place] male, alert, no distress and  comfortable SKIN: skin color, texture, turgor are normal, no rashes or significant lesions EYES: conjunctiva are pink and non-injected, sclera clear LUNGS: clear to auscultation and percussion with normal breathing effort HEART: regular rate & rhythm and no murmurs and no lower extremity edema ABDOMEN: soft, non-tender, non-distended, normal bowel sounds.   Musculoskeletal: no cyanosis of digits and no clubbing  PSYCH: alert & oriented x 3, fluent speech NEURO: no focal motor/sensory deficits  LABORATORY DATA:  I have reviewed the data as listed    Latest Ref Rng & Units 09/07/2023    8:56 AM 06/08/2023    9:49 AM 03/22/2023    9:45 AM  CBC  WBC 4.0 - 10.5 K/uL 6.2  5.9  4.4   Hemoglobin 13.0 - 17.0 g/dL 82.9  56.2  13.0   Hematocrit 39.0 - 52.0 % 43.0  42.9  42.4   Platelets 150 - 400 K/uL 247  193  206        Latest Ref Rng & Units 09/07/2023    8:56 AM 06/08/2023    9:49 AM 03/22/2023    9:45 AM  CMP  Glucose 70 - 99 mg/dL 865  784  696   BUN 8 - 23 mg/dL 17  22  22    Creatinine 0.61 - 1.24 mg/dL 2.95  2.84  1.32   Sodium 135 - 145 mmol/L 136  136  138   Potassium 3.5 - 5.1 mmol/L 3.9  3.9  4.2   Chloride 98 - 111 mmol/L 102  105  106   CO2 22 - 32 mmol/L 27  27  28    Calcium 8.9 - 10.3 mg/dL 9.4  9.6  9.4   Total Protein 6.5 - 8.1 g/dL 7.5  7.3  7.1   Total Bilirubin 0.0 - 1.2 mg/dL 0.4  0.4  0.4   Alkaline Phos 38 - 126 U/L 101  100  81   AST 15 - 41 U/L 13  19  20    ALT 0 - 44 U/L 17  23  17      RADIOGRAPHIC STUDIES: I have personally reviewed the radiological images as listed and agreed with the findings in the report: no evidence of metastatic diease in the chest. MRI abdomen does not show metastatic disease.   No results found.  ASSESSMENT & PLAN Curtis [name] 68 y.o. male with medical history significant  for rectal adenocarcinoma who presents for a follow up visit.   At this time the patient's findings are most consistent with localized rectal adenocarcinoma.   MRI of the pelvis confirmed this is a T4 N2 M0 stage IIIc cancer.  As such we will plan to proceed with neoadjuvant chemotherapy, followed by chemoradiation, followed by reevaluation for consideration of transabdominal resection.  The initial treatment of choice would be neoadjuvant FOLFOX chemotherapy for 8 cycles.  Once this is complete we will transition to chemoradiation with p.o. capecitabine therapy.  When these are complete we will restage for consideration of surgical resection.  # Rectal Adenocarcinoma, T4N2M0 Stage IIIc, in Remission.  --  staging with a CT scan of the chest and MRI abdomen/pelvis findings are consistent with localized disease.   --CEA modestly elevated at 17.44 on 02/09/2021 prior to start of therapy. --Given that this is adenocarcinoma of the rectum I would recommend proceeding with neoadjuvant chemotherapy, followed by chemoradiation, followed by definitive surgery. Case discussed at [facility] --patient completed 8 cycles of neoadjuvant FOLFOX --started chemoradiation therapy with Xeloda on 07/13/2021. Last treatment on 08/21/2021.  --last day of chemoradiation on 08/21/2021  Plan:   -- underwent LAR/DLI on 11/04/2021 at [facility] with Dr. Luciano Cutter.  Underwent ileostomy reversal on 03/17/2022 --plan for CT C/A/P q 6-12 months (x 5 years) per NCCN recommendations. Last scan on Sept 2024. Next CT scan due March 2025 --continue clinic visits q 3 months with CEA q 3-6 months x 2 years, then q 6-12 months for a total of 5 years. --plan for colonoscopy x 1 year post-operatively (April 2024). Currently overdue.  Contacted GI who noted they would schedule.   -- labs today show WBC 6.2, hemoglobin 14.5, MCV 89, platelets 247 with normal LFTs. --RTC in 12 weeks.   # AKI --Cr 1.29 today, stable --encourage hydration  # Hyperglyclemia-resolved --BG elevated, patient has no history of DM type II. Unclear why this occurred.  -- abnormal elevations occurred during chemotherapy  treatment.  --off glipizide  --continue to monitor.   #Supportive Care -- chemotherapy education complete -- port removed on 04/21/2022 -- no pain medication required at this time.   No orders of the defined types were placed in this encounter.   All questions were answered. The patient knows to call the clinic with any problems, questions or concerns.  I have spent a total of 30 minutes minutes of face-to-face and non-face-to-face time, preparing to see the patient, obtaining and/or reviewing separately obtained history, performing a medically appropriate examination, counseling and educating the patient, ordering medications,  communicating with other health care professionals, documenting clinical information in the electronic health record, and care coordination.   Ulysees Barns, MD Department of Hematology/Oncology [facility] at [facility] Phone: [phone] Pager: [phone] Email: [email or web address]   09/07/2023 9:40 AM  Hofheinz RD, Allyson Sabal, Post S, Matzdorff A, Laechelt S, Aggie Cosier, Mller L, Link H, Moehler M, Gwynneth Albright, [place], Seibert, [place], Hipp M, Hartung G, Gencer D, [place], [place] I, Hochhaus A. Chemoradiotherapy with capecitabine versus fluorouracil for locally advanced rectal cancer: a randomised, multicentre, non-inferiority, phase 3 trial. Lancet Oncol. 2012 Jun;13(6):579-88.

## 2023-09-06 NOTE — Telephone Encounter (Signed)
Received message from Dr Jeanie Sewer at the [facility]. The patient is past due his colonoscopy. He wants to get this scheduled with Dr Lavon Paganini. Curtis [name] the patient. No answer. Left a message on the voicemail of my call and my name. Asked he call us back. Patient will need a pre-visit and to be scheduled for a direct colonoscopy in the [facility].

## 2023-09-07 ENCOUNTER — Inpatient Hospital Stay: Payer: Medicare Other | Admitting: Hematology and Oncology

## 2023-09-07 ENCOUNTER — Inpatient Hospital Stay: Payer: Medicare Other | Attending: Hematology and Oncology

## 2023-09-07 VITALS — BP 156/82 | HR 71 | Temp 97.8°F | Resp 14 | Wt 214.3 lb

## 2023-09-07 DIAGNOSIS — Z923 Personal history of irradiation: Secondary | ICD-10-CM | POA: Diagnosis not present

## 2023-09-07 DIAGNOSIS — C20 Malignant neoplasm of rectum: Secondary | ICD-10-CM

## 2023-09-07 DIAGNOSIS — D701 Agranulocytosis secondary to cancer chemotherapy: Secondary | ICD-10-CM

## 2023-09-07 DIAGNOSIS — Z87442 Personal history of urinary calculi: Secondary | ICD-10-CM | POA: Insufficient documentation

## 2023-09-07 DIAGNOSIS — R635 Abnormal weight gain: Secondary | ICD-10-CM | POA: Insufficient documentation

## 2023-09-07 DIAGNOSIS — N179 Acute kidney failure, unspecified: Secondary | ICD-10-CM | POA: Diagnosis not present

## 2023-09-07 DIAGNOSIS — F17210 Nicotine dependence, cigarettes, uncomplicated: Secondary | ICD-10-CM | POA: Insufficient documentation

## 2023-09-07 DIAGNOSIS — Z881 Allergy status to other antibiotic agents status: Secondary | ICD-10-CM | POA: Insufficient documentation

## 2023-09-07 DIAGNOSIS — Z95828 Presence of other vascular implants and grafts: Secondary | ICD-10-CM

## 2023-09-07 DIAGNOSIS — Z79899 Other long term (current) drug therapy: Secondary | ICD-10-CM | POA: Insufficient documentation

## 2023-09-07 DIAGNOSIS — Z8249 Family history of ischemic heart disease and other diseases of the circulatory system: Secondary | ICD-10-CM | POA: Insufficient documentation

## 2023-09-07 DIAGNOSIS — Z803 Family history of malignant neoplasm of breast: Secondary | ICD-10-CM | POA: Insufficient documentation

## 2023-09-07 DIAGNOSIS — T451X5A Adverse effect of antineoplastic and immunosuppressive drugs, initial encounter: Secondary | ICD-10-CM | POA: Diagnosis not present

## 2023-09-07 LAB — CMP (CANCER CENTER ONLY)
ALT: 17 U/L (ref 0–44)
AST: 13 U/L — ABNORMAL LOW (ref 15–41)
Albumin: 4.4 g/dL (ref 3.5–5.0)
Alkaline Phosphatase: 101 U/L (ref 38–126)
Anion gap: 7 (ref 5–15)
BUN: 17 mg/dL (ref 8–23)
CO2: 27 mmol/L (ref 22–32)
Calcium: 9.4 mg/dL (ref 8.9–10.3)
Chloride: 102 mmol/L (ref 98–111)
Creatinine: 1.29 mg/dL — ABNORMAL HIGH (ref 0.61–1.24)
GFR, Estimated: >60 mL/min (ref >60)
Glucose, Bld: 248 mg/dL — ABNORMAL HIGH (ref 70–99)
Potassium: 3.9 mmol/L (ref 3.5–5.1)
Sodium: 136 mmol/L (ref 135–145)
Total Bilirubin: 0.4 mg/dL (ref 0.0–1.2)
Total Protein: 7.5 g/dL (ref 6.5–8.1)

## 2023-09-07 LAB — CEA (ACCESS): CEA (CHCC): 1.64 ng/mL (ref 0.00–5.00)

## 2023-09-07 LAB — CBC WITH DIFFERENTIAL (CANCER CENTER ONLY)
Abs Immature Granulocytes: 0.01 10*3/uL (ref 0.00–0.07)
Basophils Absolute: 0.1 10*3/uL (ref 0.0–0.1)
Basophils Relative: 1 %
Eosinophils Absolute: 0.2 10*3/uL (ref 0.0–0.5)
Eosinophils Relative: 4 %
HCT: 43 % (ref 39.0–52.0)
Hemoglobin: 14.5 g/dL (ref 13.0–17.0)
Immature Granulocytes: 0 %
Lymphocytes Relative: 17 %
Lymphs Abs: 1 10*3/uL (ref 0.7–4.0)
MCH: 30 pg (ref 26.0–34.0)
MCHC: 33.7 g/dL (ref 30.0–36.0)
MCV: 89 fL (ref 80.0–100.0)
Monocytes Absolute: 0.4 10*3/uL (ref 0.1–1.0)
Monocytes Relative: 6 %
Neutro Abs: 4.5 10*3/uL (ref 1.7–7.7)
Neutrophils Relative %: 72 %
Platelet Count: 247 10*3/uL (ref 150–400)
RBC: 4.83 MIL/uL (ref 4.22–5.81)
RDW: 13.1 % (ref 11.5–15.5)
WBC Count: 6.2 10*3/uL (ref 4.0–10.5)
nRBC: 0 % (ref 0.0–0.2)

## 2023-09-08 NOTE — Telephone Encounter (Signed)
Called patient, phone number goes straight to voicemail. Left voicemail for patient to give our office a call back to discuss scheduling colonoscopy.

## 2023-09-08 NOTE — Telephone Encounter (Signed)
Patient has been scheduled for 10/10/23.

## 2023-09-09 ENCOUNTER — Encounter: Payer: Self-pay | Admitting: Hematology and Oncology

## 2023-09-13 ENCOUNTER — Ambulatory Visit (AMBULATORY_SURGERY_CENTER): Payer: Medicare Other | Admitting: *Deleted

## 2023-09-13 VITALS — Ht 69.0 in | Wt 210.0 lb

## 2023-09-13 DIAGNOSIS — Z860100 Personal history of colon polyps, unspecified: Secondary | ICD-10-CM

## 2023-09-13 DIAGNOSIS — C20 Malignant neoplasm of rectum: Secondary | ICD-10-CM

## 2023-09-13 MED ORDER — SUFLAVE 178.7 G PO SOLR: 1.0000 | Freq: Once | ORAL | 0 refills | Status: AC

## 2023-09-13 NOTE — Progress Notes (Signed)
Pt's name and DOB verified at the beginning of the pre-visit wit 2 identifiers  Pt denies any difficulty with ambulating,sitting, laying down or rolling side to side  Pt has no issues with ambulation   Pt has no issues moving head neck or swallowing  No egg or soy allergy known to patient   No issues known to pt with past sedation with any surgeries or procedures  Pt denies having issues being intubated  No FH of Malignant Hyperthermia  Pt is not on diet pills or shots  Pt is not on home 02   Pt is not on blood thinners   Pt denies issues with constipation   Pt is not on dialysis  Pt denise any abnormal heart rhythms   Pt denies any upcoming cardiac testing  Patient's chart reviewed by Cathlyn Parsons CNRA prior to pre-visit and patient appropriate for the [facility].  Pre-visit completed and red dot placed by patient's name on their procedure day (on provider's schedule).    Chart not reviewed by CRNA prior to [facility]  Visit by phone  Pt states weight is 210 lb  IInstructions reviewed. Pt given [facility], [facility] main # and MD on call # prior to instructions.  Pt states understanding of instructions. Instructed to review again prior to procedure. Pt states they will.   Informed pt that they will receive a text or  call from [facility] regarding there prep med.

## 2023-09-27 ENCOUNTER — Ambulatory Visit (HOSPITAL_COMMUNITY)
Admission: RE | Admit: 2023-09-27 | Discharge: 2023-09-27 | Disposition: A | Discharge status: Home / Self Care | Payer: Medicare Other | Source: Ambulatory Visit | Attending: Hematology and Oncology | Admitting: Hematology and Oncology

## 2023-09-27 DIAGNOSIS — C20 Malignant neoplasm of rectum: Secondary | ICD-10-CM | POA: Diagnosis present

## 2023-09-27 MED ORDER — IOHEXOL 300 MG/ML  SOLN
100.0000 mL | Freq: Once | INTRAMUSCULAR | Status: AC | PRN
  Administered 2023-09-27: 100 mL via INTRAVENOUS

## 2023-09-27 MED ORDER — IOHEXOL 300 MG/ML  SOLN
30.0000 mL | Freq: Once | INTRAMUSCULAR | Status: AC | PRN
  Administered 2023-09-27: 30 mL via ORAL

## 2023-09-29 ENCOUNTER — Telehealth: Payer: Self-pay | Admitting: *Deleted

## 2023-09-29 NOTE — Telephone Encounter (Signed)
 TCT patient regarding recent scan results.  Spoke to him. Advised that his CT scan showed no evidence of recurrent cancer. We'll see him back as scheduled in May 2025. Pt very pleased with the good news. He is aware of his appts in May 2025

## 2023-09-29 NOTE — Telephone Encounter (Signed)
-----   Message from Ulysees Barns IV sent at 09/29/2023  7:21 AM EST ----- Please let Mr. Sweigert know his CT scan showed no evidence of recurrent cancer. We'll see him back as scheduled in May 2025. ----- Message ----- From: Leory Plowman, Rad Results In Sent: 09/27/2023   6:03 PM EST To: Jaci Standard, MD

## 2023-09-30 ENCOUNTER — Encounter: Payer: Self-pay | Admitting: Gastroenterology

## 2023-10-10 ENCOUNTER — Ambulatory Visit: Payer: Medicare Other | Admitting: Gastroenterology

## 2023-10-10 ENCOUNTER — Encounter: Payer: Self-pay | Admitting: Gastroenterology

## 2023-10-10 VITALS — BP 140/76 | HR 62 | Temp 98.1°F | Resp 17 | Ht 69.0 in | Wt 210.0 lb

## 2023-10-10 DIAGNOSIS — C20 Malignant neoplasm of rectum: Secondary | ICD-10-CM

## 2023-10-10 DIAGNOSIS — Z85048 Personal history of other malignant neoplasm of rectum, rectosigmoid junction, and anus: Secondary | ICD-10-CM | POA: Diagnosis not present

## 2023-10-10 DIAGNOSIS — Z1211 Encounter for screening for malignant neoplasm of colon: Secondary | ICD-10-CM

## 2023-10-10 DIAGNOSIS — K621 Rectal polyp: Secondary | ICD-10-CM | POA: Diagnosis not present

## 2023-10-10 DIAGNOSIS — Z860100 Personal history of colon polyps, unspecified: Secondary | ICD-10-CM

## 2023-10-10 DIAGNOSIS — Z860101 Personal history of adenomatous and serrated colon polyps: Secondary | ICD-10-CM

## 2023-10-10 DIAGNOSIS — Z980 Intestinal bypass and anastomosis status: Secondary | ICD-10-CM

## 2023-10-10 DIAGNOSIS — D128 Benign neoplasm of rectum: Secondary | ICD-10-CM

## 2023-10-10 MED ORDER — SODIUM CHLORIDE 0.9 % IV SOLN: 500.0000 mL | Freq: Once | INTRAVENOUS | Status: AC

## 2023-10-10 NOTE — Progress Notes (Unsigned)
 To pacu, VSS. Report to [email or web address]

## 2023-10-10 NOTE — Progress Notes (Unsigned)
 Pt's states no medical or surgical changes since previsit or office visit.

## 2023-10-10 NOTE — Op Note (Signed)
 [facility] Patient Name: Korbyn Chopin Procedure Date: 10/10/2023 3:07 PM MRN: [id number] Endoscopist: Napoleon Form , MD, [id number] Age: 68 Referring MD:  Date of Birth: 1955/11/12 Gender: Male Account #: [id number] Procedure:                Colonoscopy Indications:              High risk colon cancer surveillance: Personal                            history of colonic polyps, High risk colon cancer                            surveillance: Personal history of colon cancer Medicines:                Monitored Anesthesia Care Procedure:                Pre-Anesthesia Assessment:                           - Prior to the procedure, a History and Physical                            was performed, and patient medications and                            allergies were reviewed. The patient's tolerance of                            previous anesthesia was also reviewed. The risks                            and benefits of the procedure and the sedation                            options and risks were discussed with the patient.                            All questions were answered, and informed consent                            was obtained. Prior Anticoagulants: The patient has                            taken no anticoagulant or antiplatelet agents. ASA                            Grade Assessment: III - A patient with severe                            systemic disease. After reviewing the risks and                            benefits, the patient was deemed in satisfactory  condition to undergo the procedure.                           After obtaining informed consent, the colonoscope                            was passed under direct vision. Throughout the                            procedure, the patient's blood pressure, pulse, and                            oxygen saturations were monitored continuously. The                            Olympus  Scope SN: [id number] was introduced through                            the anus and advanced to the the cecum, identified                            by appendiceal orifice and ileocecal valve. The                            colonoscopy was performed without difficulty. The                            patient tolerated the procedure well. The quality                            of the bowel preparation was good. The ileocecal                            valve, appendiceal orifice, and rectum were                            photographed. Scope In: 3:22:27 PM Scope Out: 3:31:23 PM Scope Withdrawal Time: 0 hours 7 minutes 13 seconds  Total Procedure Duration: 0 hours 8 minutes 56 seconds  Findings:                 The perianal and digital rectal examinations were                            normal.                           There was evidence of a prior functional end-to-end                            low-anterior anastomosis in the rectum. This was                            patent and was characterized by healthy appearing  mucosa. The anastomosis was traversed.                           Two sessile polyps were found in the rectum. The                            polyps were 1 to 2 mm in size. These polyps were                            removed with a cold biopsy forceps. Resection and                            retrieval were complete.                           The exam was otherwise without abnormality. Complications:            No immediate complications. Estimated Blood Loss:     Estimated blood loss was minimal. Impression:               - Patent functional end-to-end low-anterior                            anastomosis, characterized by healthy appearing                            mucosa.                           - Two 1 to 2 mm polyps in the rectum, removed with                            a cold biopsy forceps. Resected and retrieved.                           -  The examination was otherwise normal. Recommendation:           - Patient has a contact number available for                            emergencies. The signs and symptoms of potential                            delayed complications were discussed with the                            patient. Return to normal activities tomorrow.                            Written discharge instructions were provided to the                            patient.                           - Resume previous diet.                           -  Continue present medications.                           - Await pathology results.                           - Repeat colonoscopy in 3 years for surveillance                            based on pathology results. Napoleon Form, MD 10/10/2023 3:39:56 PM This report has been signed electronically.

## 2023-10-10 NOTE — Progress Notes (Unsigned)
 [facility] History and Physical   Primary Care Physician:  Pcp, No   Reason for Procedure:  History of adenomatous colon polyps, personal h/o colorectal cancer  Plan:    Surveillance colonoscopy with possible interventions as needed     HPI: Curtis [name] is a very pleasant 68 y.o. male here for surveillance colonoscopy.   The risks and benefits as well as alternatives of endoscopic procedure(s) have been discussed and reviewed. All questions answered. The patient agrees to proceed.    Past Medical History:  Diagnosis Date   Chronic kidney disease    History of kidney stones    Rectosigmoid cancer [facility])     Past Surgical History:  Procedure Laterality Date   BIOPSY  01/21/2021   Procedure: BIOPSY;  Surgeon: Napoleon Form, MD;  Location: [facility];  Service: Endoscopy;;   COLON RESECTION N/A 01/21/2021   Procedure: LAPAROSCOPIC ASSISTED LOOP COLOSTOMY;  Surgeon: Manus Rudd, MD;  Location: [facility] OR;  Service: General;  Laterality: N/A;   COLON RESECTION SIGMOID  11/04/2021   COLONOSCOPY     CYSTOSCOPY W/ URETERAL STENT PLACEMENT Right 01/03/2022   Procedure: CYSTOSCOPY WITH  URETERAL STENT PLACEMENT;  Surgeon: Belva Agee, MD;  Location: [facility] OR;  Service: Urology;  Laterality: Right;   CYSTOSCOPY/URETEROSCOPY/HOLMIUM LASER/STENT PLACEMENT Right 02/09/2022   Procedure: CYSTOSCOPY/URETEROSCOPY/STENT EXCHANGE;  Surgeon: Belva Agee, MD;  Location: [facility] ORS;  Service: Urology;  Laterality: Right;  30 MINUTES   FLEXIBLE SIGMOIDOSCOPY N/A 01/21/2021   Procedure: FLEXIBLE SIGMOIDOSCOPY;  Surgeon: Napoleon Form, MD;  [facility];  Service: Endoscopy;  Laterality: N/A;   IR IMAGING GUIDED PORT INSERTION  03/04/2021   IR REMOVAL TUN ACCESS W/ PORT W/O FL MOD SED  04/21/2022   SUBMUCOSAL TATTOO INJECTION  01/21/2021   Procedure: SUBMUCOSAL TATTOO INJECTION;  Surgeon: Napoleon Form, MD;  [facility];  Service: Endoscopy;;     Prior to Admission medications   Medication Sig Start Date End Date Taking? Authorizing Provider  acetaminophen (TYLENOL) 500 MG tablet Take 1,000 mg by mouth every 8 (eight) hours as needed for headache or moderate pain.   Yes [provider]  amLODipine (NORVASC) 5 MG tablet Take 1 tablet (5 mg total) by mouth daily. 06/08/23  Yes Jaci Standard, MD  loperamide (IMODIUM) 2 MG capsule Take 4 mg by mouth daily as needed for diarrhea or loose stools. Up to 6 /day   Yes [provider]    Current Outpatient Medications  Medication Sig Dispense Refill   acetaminophen (TYLENOL) 500 MG tablet Take 1,000 mg by mouth every 8 (eight) hours as needed for headache or moderate pain.     amLODipine (NORVASC) 5 MG tablet Take 1 tablet (5 mg total) by mouth daily. 90 tablet 1   loperamide (IMODIUM) 2 MG capsule Take 4 mg by mouth daily as needed for diarrhea or loose stools. Up to 6 /day     Current Facility-Administered Medications  Medication Dose Route Frequency Provider Last Rate Last Admin   0.9 %  sodium chloride infusion  500 mL Intravenous Once Napoleon Form, MD        Allergies as of 10/10/2023 - Review Complete 10/10/2023  Allergen Reaction Noted   Cefazolin Hives 01/07/2022   Leucovorin Itching and Rash 06/22/2021   Oxaliplatin Itching and Rash 06/22/2021    Family History  Problem Relation Age of Onset   Hypertension Mother    Hypertension Father    Breast cancer  Sister    Colon cancer Neg Hx    Colon polyps Neg Hx    Esophageal cancer Neg Hx    Rectal cancer Neg Hx    Stomach cancer Neg Hx     Social History   Socioeconomic History   Marital status: Divorced    Spouse name: Not on file   Number of children: Not on file   Years of education: Not on file   Highest education level: Not on file  Occupational History   Not on file  Tobacco Use   Smoking status: Some Days    Current packs/day: 0.00    Types: Cigarettes    Last attempt to  quit: 01/05/2022    Years since quitting: 1.7   Smokeless tobacco: Never  Vaping Use   Vaping status: Never Used  Substance and Sexual Activity   Alcohol use: Never   Drug use: Never   Sexual activity: Not Currently  Other Topics Concern   Not on file  Social History Narrative   Not on file   Social Drivers of Health   Financial Resource Strain: Low Risk  (02/09/2021)   Overall Financial Resource Strain (CARDIA)    Difficulty of Paying Living Expenses: Not very hard  Food Insecurity: No Food Insecurity (02/09/2021)   Hunger Vital Sign    Worried About Running Out of Food in the Last Year: Never true    Ran Out of Food in the Last Year: Never true  Transportation Needs: No Transportation Needs (11/06/2021)   Received [facility], [facility]   PRAPARE - Transportation    In the past 12 months, has lack of transportation kept you from medical appointments or from getting medications?: No    Lack of Transportation (Non-Medical): No  Physical Activity: Not on file  Stress: Not on file  Social Connections: Not on file  Intimate Partner Violence: Not At Risk (02/26/2021)   Humiliation, Afraid, Rape, and Kick questionnaire    Fear of Current or Ex-Partner: No    Emotionally Abused: No    Physically Abused: No    Sexually Abused: No    Review of Systems:  All other review of systems negative except as mentioned in the HPI.  Physical Exam: Vital signs in last 24 hours: BP 125/77   Pulse 87   Temp 98.1 F (36.7 C) (Temporal)   Ht 5\' 9"  (1.753 m)   Wt 210 lb (95.3 kg)   SpO2 96%   BMI 31.01 kg/m  General:   Alert, NAD Lungs:  Clear .   Heart:  Regular rate and rhythm Abdomen:  Soft, nontender and nondistended. Neuro/Psych:  Alert and cooperative. Normal mood and affect. A and O x 3  Reviewed labs, radiology imaging, old records and pertinent past GI work up  Patient is appropriate for planned procedure(s) and anesthesia in an  ambulatory setting   K. Scherry Ran , MD [phone]

## 2023-10-10 NOTE — Patient Instructions (Signed)
 Please read handouts provided. Continue present medications. Resume previous diet. Await pathology results. Repeat colonoscopy in 3 years for screening.   YOU HAD AN ENDOSCOPIC PROCEDURE TODAY AT THE [facility]:   Refer to the procedure report that was given to you for any specific questions about what was found during the examination.  If the procedure report does not answer your questions, please call your gastroenterologist to clarify.  If you requested that your care partner not be given the details of your procedure findings, then the procedure report has been included in a sealed envelope for you to review at your convenience later.  YOU SHOULD EXPECT: Some feelings of bloating in the abdomen. Passage of more gas than usual.  Walking can help get rid of the air that was put into your GI tract during the procedure and reduce the bloating. If you had a lower endoscopy (such as a colonoscopy or flexible sigmoidoscopy) you may notice spotting of blood in your stool or on the toilet paper. If you underwent a bowel prep for your procedure, you may not have a normal bowel movement for a few days.  Please Note:  You might notice some irritation and congestion in your nose or some drainage.  This is from the oxygen used during your procedure.  There is no need for concern and it should clear up in a day or so.  SYMPTOMS TO REPORT IMMEDIATELY:  Following lower endoscopy (colonoscopy or flexible sigmoidoscopy):  Excessive amounts of blood in the stool  Significant tenderness or worsening of abdominal pains  Swelling of the abdomen that is new, acute  Fever of 100F or higher.  For urgent or emergent issues, a gastroenterologist can be reached at any hour by calling [phone]. Do not use MyChart messaging for urgent concerns.    DIET:  We do recommend a small meal at first, but then you may proceed to your regular diet.  Drink plenty of fluids but you should avoid alcoholic  beverages for 24 hours.  ACTIVITY:  You should plan to take it easy for the rest of today and you should NOT DRIVE or use heavy machinery until tomorrow (because of the sedation medicines used during the test).    FOLLOW UP: Our staff will call the number listed on your records the next business day following your procedure.  We will call around 7:15- 8:00 am to check on you and address any questions or concerns that you may have regarding the information given to you following your procedure. If we do not reach you, we will leave a message.     If any biopsies were taken you will be contacted by phone or by letter within the next 1-3 weeks.  Please call us at [phone] if you have not heard about the biopsies in 3 weeks.    SIGNATURES/CONFIDENTIALITY: You and/or your care partner have signed paperwork which will be entered into your electronic medical record.  These signatures attest to the fact that that the information above on your After Visit Summary has been reviewed and is understood.  Full responsibility of the confidentiality of this discharge information lies with you and/or your care-partner.

## 2023-10-10 NOTE — Progress Notes (Unsigned)
 Called to room to assist during endoscopic procedure.  Patient ID and intended procedure confirmed with present staff. Received instructions for my participation in the procedure from the performing physician.

## 2023-10-11 ENCOUNTER — Telehealth: Payer: Self-pay

## 2023-10-11 NOTE — Telephone Encounter (Signed)
  Follow up Call-     10/10/2023    2:48 PM 09/09/2021   10:29 AM  Call back number  Post procedure Call Back phone  # [phone]  Permission to leave phone message Yes Yes     Patient questions:  Do you have a fever, pain , or abdominal swelling? No. Pain Score  0 *  Have you tolerated food without any problems? Yes.    Have you been able to return to your normal activities? Yes.    Do you have any questions about your discharge instructions: Diet   No. Medications  No. Follow up visit  No.  Do you have questions or concerns about your Care? No.  Actions: * If pain score is 4 or above: No action needed, pain <4.

## 2023-10-12 ENCOUNTER — Encounter: Payer: Self-pay | Admitting: Gastroenterology

## 2023-10-13 LAB — SURGICAL PATHOLOGY

## 2023-12-05 ENCOUNTER — Encounter: Payer: Self-pay | Admitting: Gastroenterology

## 2023-12-08 ENCOUNTER — Other Ambulatory Visit: Payer: Self-pay | Admitting: Hematology and Oncology

## 2023-12-08 ENCOUNTER — Telehealth: Payer: Self-pay | Admitting: Hematology and Oncology

## 2023-12-11 ENCOUNTER — Other Ambulatory Visit: Payer: Self-pay | Admitting: Hematology and Oncology

## 2023-12-11 DIAGNOSIS — C20 Malignant neoplasm of rectum: Secondary | ICD-10-CM

## 2023-12-11 NOTE — Progress Notes (Signed)
 [facility] Telephone:([phone]   [phone]  PROGRESS NOTE  Patient Care Team: Pcp, No as PCP - General [name], MD as Consulting Physician (Medical Oncology) [name], RN (Inactive) as Oncology Nurse Navigator (Oncology)  Hematological/Oncological History # Rectal Adenocarcinoma, T4N2M0. Stage IIIc, In Remission 01/16/2021: presented to the emergency department with abdominal pain. CT abdomen/pelvis showed concern for mass lesion with perforation and surrounding adenopathy. 01/19/2021: repeat CT scan showed worsening inflammatory changes around the complex rectal process 01/21/2021: flexible sigmoidoscopy performed, lesion at 12 cm from the anal verge.  biopsy showed concern for adenocarcinoma. 01/21/2021: patient underwent a laparoscopic assisted loop colostomy with Dr. Eli Grizzle. 02/09/2021: establish care with Dr. Rosaline Coma  03/05/2021: Cycle 1 Day 1 of neoadjuvant FOLFOX 03/18/2021: Cycle 2 Day 1 of neoadjuvant FOLFOX 04/02/2021: Cycle 3 Day 1 of neoadjuvant FOLFOX 04/15/2021: Cycle 4 Day 1 of neoadjuvant FOLFOX 04/30/2021: Cycle 5 Day 1 of neoadjuvant FOLFOX 05/14/2021: Cycle 6 Day 1 of neoadjuvant FOLFOX 05/27/2021: Cycle 7 Day 1 of neoadjuvant FOLFOX 06/10/2021: Cycle 8 Day 1 of neoadjuvant FOLFOX delayed due to neutropenia.  06/22/2021: Cycle 8 Day 1 of neoadjuvant FOLFOX 07/13/2021: start of Chemoradiation with Xeloda  825 mg/m2 BID 08/21/2021: Last day of chemoradiation 09/09/2021: pre-surgical colonoscopy performed, no concerning abnormalities.  11/04/2021: LAR/DLI at [facility] with Dr. Lenore Rafter 03/17/2022: Loop ileostomy takedown.   Interval History:  Curtis [name] 68 y.o. male with medical history significant for rectal adenocarcinoma who presents for a follow up visit. The patient's last visit was on 09/07/2023. In the interim since last visit he has had no major changes in his health.  On exam today [name] reports he has been well overall  in the interim since her last visit.  He reports his colonoscopy went well and was delighted to hear the results of his CT scan.  He reports his energy levels have been quite strong ranking them about a 9 out of 10.  He has been working about 4 days/week.  His appetite remains strong though he does predominantly only eat at home but due to urgent bowel movements he gets after eating at work.  He reports that he has not seen any bleeding anywhere or dark stools.  He is not having any urinary symptoms either.  Overall he feels well and has no questions concerns or complaints today.  He is not currently having any pain anywhere.  He denies any fevers, chills, sweats, nausea, vomiting or contstipation.  A full 10 point ROS is otherwise negative.  MEDICAL HISTORY:  Past Medical History:  Diagnosis Date   Chronic kidney disease    History of kidney stones    Rectosigmoid cancer (HCC)     SURGICAL HISTORY: Past Surgical History:  Procedure Laterality Date   BIOPSY  01/21/2021   Procedure: BIOPSY;  Surgeon: Sergio Dandy, MD;  [facility]: [facility];  Service: Endoscopy;;   COLON RESECTION N/A 01/21/2021   Procedure: LAPAROSCOPIC ASSISTED LOOP COLOSTOMY;  Surgeon: Dareen Ebbing, MD;  Location: [facility] OR;  Service: General;  Laterality: N/A;   COLON RESECTION SIGMOID  11/04/2021   COLONOSCOPY     CYSTOSCOPY W/ URETERAL STENT PLACEMENT Right 01/03/2022   Procedure: CYSTOSCOPY WITH  URETERAL STENT PLACEMENT;  Surgeon: Sherlyn Ditto, MD;  Location: [facility] OR;  Service: Urology;  Laterality: Right;   CYSTOSCOPY/URETEROSCOPY/HOLMIUM LASER/STENT PLACEMENT Right 02/09/2022   Procedure: CYSTOSCOPY/URETEROSCOPY/STENT EXCHANGE;  Surgeon: Sherlyn Ditto, MD;  Location: [facility]S;  Service: Urology;  Laterality: Right;  30 MINUTES   FLEXIBLE SIGMOIDOSCOPY N/A 01/21/2021   Procedure: FLEXIBLE SIGMOIDOSCOPY;  Surgeon: Sergio Dandy, MD;  [facility]: [facility];  Service: Endoscopy;  Laterality: N/A;   IR  IMAGING GUIDED PORT INSERTION  03/04/2021   IR REMOVAL TUN ACCESS W/ PORT W/O FL MOD SED  04/21/2022   SUBMUCOSAL TATTOO INJECTION  01/21/2021   Procedure: SUBMUCOSAL TATTOO INJECTION;  Surgeon: Sergio Dandy, MD;  Location: [facility];  Service: Endoscopy;;    SOCIAL HISTORY: Social History   Socioeconomic History   Marital status: Divorced    Spouse name: Not on file   Number of children: Not on file   Years of education: Not on file   Highest education level: Not on file  Occupational History   Not on file  Tobacco Use   Smoking status: Some Days    Current packs/day: 0.00    Types: Cigarettes    Last attempt to quit: 01/05/2022    Years since quitting: 1.9   Smokeless tobacco: Never  Vaping Use   Vaping status: Never Used  Substance and Sexual Activity   Alcohol use: Never   Drug use: Never   Sexual activity: Not Currently  Other Topics Concern   Not on file  Social History Narrative   Not on file   Social Drivers of Health   Financial Resource Strain: Low Risk  (02/09/2021)   Overall Financial Resource Strain (CARDIA)    Difficulty of Paying Living Expenses: Not very hard  Food Insecurity: No Food Insecurity (02/09/2021)   Hunger Vital Sign    Worried About Running Out of Food in the Last Year: Never true    Ran Out of Food in the Last Year: Never true  Transportation Needs: No Transportation Needs (11/06/2021)   [facility], [facility]   PRAPARE - Transportation    In the past 12 months, has lack of transportation kept you from medical appointments or from getting medications?: No    Lack of Transportation (Non-Medical): No  Physical Activity: Not on file  Stress: Not on file  Social Connections: Not on file  Intimate Partner Violence: Not At Risk (02/26/2021)   Humiliation, Afraid, Rape, and Kick questionnaire    Fear of Current or Ex-Partner: No    Emotionally Abused: No    Physically Abused: No     Sexually Abused: No    FAMILY HISTORY: Family History  Problem Relation Age of Onset   Hypertension Mother    Hypertension Father    Breast cancer Sister    Colon cancer Neg Hx    Colon polyps Neg Hx    Esophageal cancer Neg Hx    Rectal cancer Neg Hx    Stomach cancer Neg Hx     ALLERGIES:  is allergic to cefazolin , leucovorin , and oxaliplatin .  MEDICATIONS:  Current Outpatient Medications  Medication Sig Dispense Refill   acetaminophen  (TYLENOL ) 500 MG tablet Take 1,000 mg by mouth every 8 (eight) hours as needed for headache or moderate pain.     amLODipine  (NORVASC ) 5 MG tablet Take 1 tablet by mouth once daily 90 tablet 0   loperamide (IMODIUM) 2 MG capsule Take 4 mg by mouth daily as needed for diarrhea or loose stools. Up to 6 /day     Current Facility-Administered Medications  Medication Dose Route Frequency Provider Last Rate Last Admin   0.9 %  sodium chloride  infusion  500 mL Intravenous Once Nandigam, Kavitha V, MD  REVIEW OF SYSTEMS:   Constitutional: ( - ) fevers, ( - )  chills , ( - ) night sweats Eyes: ( - ) blurriness of vision, ( - ) double vision, ( - ) watery eyes Ears, nose, mouth, throat, and face: ( - ) mucositis, ( - ) sore throat Respiratory: ( - ) cough, ( - ) dyspnea, ( - ) wheezes Cardiovascular: ( - ) palpitation, ( - ) chest discomfort, ( - ) lower extremity swelling Gastrointestinal:  ( - ) nausea, ( - ) heartburn, ( - ) change in bowel habits Skin: ( - ) abnormal skin rashes Lymphatics: ( - ) new lymphadenopathy, ( - ) easy bruising Neurological: ( - ) numbness, ( - ) tingling, ( - ) new weaknesses Behavioral/Psych: ( - ) mood change, ( - ) new changes  All other systems were reviewed with the patient and are negative.  PHYSICAL EXAMINATION: ECOG PERFORMANCE STATUS: 1 - Symptomatic but completely ambulatory  Vitals:   12/12/23 1140  BP: (!) 145/75  Pulse: 64  Resp: 13  Temp: 97.9 F (36.6 C)  SpO2: 98%         Filed  Weights   12/12/23 1140  Weight: 208 lb 9.6 oz (94.6 kg)    GENERAL: Well-appearing middle-aged [place] male, alert, no distress and comfortable SKIN: skin color, texture, turgor are normal, no rashes or significant lesions EYES: conjunctiva are pink and non-injected, sclera clear LUNGS: clear to auscultation and percussion with normal breathing effort HEART: regular rate & rhythm and no murmurs and no lower extremity edema ABDOMEN: soft, non-tender, non-distended, normal bowel sounds.   Musculoskeletal: no cyanosis of digits and no clubbing  PSYCH: alert & oriented x 3, fluent speech NEURO: no focal motor/sensory deficits  LABORATORY DATA:  I have reviewed the data as listed    Latest Ref Rng & Units 12/12/2023   11:15 AM 09/07/2023    8:56 AM 06/08/2023    9:49 AM  CBC  WBC 4.0 - 10.5 K/uL 5.7  6.2  5.9   Hemoglobin 13.0 - 17.0 g/dL 16.1  09.6  04.5   Hematocrit 39.0 - 52.0 % 40.0  43.0  42.9   Platelets 150 - 400 K/uL 215  247  193        Latest Ref Rng & Units 12/12/2023   11:15 AM 09/07/2023    8:56 AM 06/08/2023    9:49 AM  CMP  Glucose 70 - 99 mg/dL 409  811  914   BUN 8 - 23 mg/dL 17  17  22    Creatinine 0.61 - 1.24 mg/dL 7.82  9.56  2.13   Sodium 135 - 145 mmol/L 138  136  136   Potassium 3.5 - 5.1 mmol/L 3.7  3.9  3.9   Chloride 98 - 111 mmol/L 107  102  105   CO2 22 - 32 mmol/L 27  27  27    Calcium  8.9 - 10.3 mg/dL 9.0  9.4  9.6   Total Protein 6.5 - 8.1 g/dL 7.3  7.5  7.3   Total Bilirubin 0.0 - 1.2 mg/dL 0.5  0.4  0.4   Alkaline Phos 38 - 126 U/L 79  101  100   AST 15 - 41 U/L 14  13  19    ALT 0 - 44 U/L 16  17  23      RADIOGRAPHIC STUDIES: I have personally reviewed the radiological images as listed and agreed with the findings in the report: no evidence  of metastatic diease in the chest. MRI abdomen does not show metastatic disease.   No results found.  ASSESSMENT & PLAN Curtis [name] 68 y.o. male with medical history significant for rectal  adenocarcinoma who presents for a follow up visit.   At this time the patient's findings are most consistent with localized rectal adenocarcinoma.  MRI of the pelvis confirmed this is a T4 N2 M0 stage IIIc cancer.  As such we will plan to proceed with neoadjuvant chemotherapy, followed by chemoradiation, followed by reevaluation for consideration of transabdominal resection.  The initial treatment of choice would be neoadjuvant FOLFOX chemotherapy for 8 cycles.  Once this is complete we will transition to chemoradiation with p.o. capecitabine  therapy.  When these are complete we will restage for consideration of surgical resection.  # Rectal Adenocarcinoma, T4N2M0 Stage IIIc, in Remission.  --  staging with a CT scan of the chest and MRI abdomen/pelvis findings are consistent with localized disease.   --CEA modestly elevated at 17.44 on 02/09/2021 prior to start of therapy. --Given that this is adenocarcinoma of the rectum I would recommend proceeding with neoadjuvant chemotherapy, followed by chemoradiation, followed by definitive surgery. Case discussed at [facility] --patient completed 8 cycles of neoadjuvant FOLFOX --started chemoradiation therapy with Xeloda  on 07/13/2021. Last treatment on 08/21/2021.  --last day of chemoradiation on 08/21/2021  Plan:   -- underwent LAR/DLI on 11/04/2021 at [facility] with Dr. Lenore Rafter.  Underwent ileostomy reversal on 03/17/2022 --plan for CT C/A/P q 6-12 months (x 5 years) per NCCN recommendations. Last scan in March 2025. Next CT scan due Sept 2025 --continue clinic visits q 3 months with CEA q 3-6 months x 2 years, then q 6-12 months for a total of 5 years. --post-operative colonoscopy on 10/10/2023. Repeat recommended in 2028.  -- labs today show WBC 5.7, Hgb 13.8, Mcv 85.5, Plt 215 with normal LFTs. --RTC in 12 weeks.   # AKI --Cr 1.27 today, stable --encourage hydration  # Hyperglyclemia-resolved --BG elevated, patient has no history of DM type II.  Unclear why this occurred.  -- abnormal elevations occurred during chemotherapy treatment.  --off glipizide   --continue to monitor.   #Supportive Care -- chemotherapy education complete -- port removed on 04/21/2022 -- no pain medication required at this time.   No orders of the defined types were placed in this encounter.   All questions were answered. The patient knows to call the clinic with any problems, questions or concerns.  I have spent a total of 30 minutes minutes of face-to-face and non-face-to-face time, preparing to see the patient, obtaining and/or reviewing separately obtained history, performing a medically appropriate examination, counseling and educating the patient, ordering medications,  communicating with other health care professionals, documenting clinical information in the electronic health record, and care coordination.   Rogerio Clay, MD Department of Hematology/Oncology [facility] Phone: [phone] Pager: [phone] Email: [email or web address]   12/12/2023 2:46 PM  Hofheinz RD, Rutherford Cowing, Post S, Matzdorff A, Laechelt S, Hartmann JT, Mller L, Link H, Moehler M, Kettner E, Fritz E, Hieber U, Lindemann HW, [place], Kremers S, Constantin C, Hipp M, Hartung G, Gencer D, Kienle P, Sprague I, Hochhaus A. Chemoradiotherapy with capecitabine  versus fluorouracil  for locally advanced rectal cancer: a randomised, multicentre, non-inferiority, phase 3 trial. Lancet Oncol. 2012 Jun;13(6):579-88.

## 2023-12-12 ENCOUNTER — Inpatient Hospital Stay: Attending: Hematology and Oncology

## 2023-12-12 ENCOUNTER — Inpatient Hospital Stay: Admitting: Hematology and Oncology

## 2023-12-12 VITALS — BP 145/75 | HR 64 | Temp 97.9°F | Resp 13 | Wt 208.6 lb

## 2023-12-12 DIAGNOSIS — Z881 Allergy status to other antibiotic agents status: Secondary | ICD-10-CM | POA: Diagnosis not present

## 2023-12-12 DIAGNOSIS — Z79899 Other long term (current) drug therapy: Secondary | ICD-10-CM | POA: Diagnosis not present

## 2023-12-12 DIAGNOSIS — F17210 Nicotine dependence, cigarettes, uncomplicated: Secondary | ICD-10-CM | POA: Insufficient documentation

## 2023-12-12 DIAGNOSIS — Z87442 Personal history of urinary calculi: Secondary | ICD-10-CM | POA: Diagnosis not present

## 2023-12-12 DIAGNOSIS — Z923 Personal history of irradiation: Secondary | ICD-10-CM | POA: Insufficient documentation

## 2023-12-12 DIAGNOSIS — Z8249 Family history of ischemic heart disease and other diseases of the circulatory system: Secondary | ICD-10-CM | POA: Diagnosis not present

## 2023-12-12 DIAGNOSIS — N179 Acute kidney failure, unspecified: Secondary | ICD-10-CM | POA: Insufficient documentation

## 2023-12-12 DIAGNOSIS — C20 Malignant neoplasm of rectum: Secondary | ICD-10-CM

## 2023-12-12 DIAGNOSIS — Z803 Family history of malignant neoplasm of breast: Secondary | ICD-10-CM | POA: Insufficient documentation

## 2023-12-12 DIAGNOSIS — R109 Unspecified abdominal pain: Secondary | ICD-10-CM | POA: Insufficient documentation

## 2023-12-12 DIAGNOSIS — Z95828 Presence of other vascular implants and grafts: Secondary | ICD-10-CM

## 2023-12-12 LAB — CBC WITH DIFFERENTIAL (CANCER CENTER ONLY)
Abs Immature Granulocytes: 0.01 10*3/uL (ref 0.00–0.07)
Basophils Absolute: 0.1 10*3/uL (ref 0.0–0.1)
Basophils Relative: 1 %
Eosinophils Absolute: 0.3 10*3/uL (ref 0.0–0.5)
Eosinophils Relative: 6 %
HCT: 40 % (ref 39.0–52.0)
Hemoglobin: 13.8 g/dL (ref 13.0–17.0)
Immature Granulocytes: 0 %
Lymphocytes Relative: 18 %
Lymphs Abs: 1 10*3/uL (ref 0.7–4.0)
MCH: 29.5 pg (ref 26.0–34.0)
MCHC: 34.5 g/dL (ref 30.0–36.0)
MCV: 85.5 fL (ref 80.0–100.0)
Monocytes Absolute: 0.3 10*3/uL (ref 0.1–1.0)
Monocytes Relative: 6 %
Neutro Abs: 4 10*3/uL (ref 1.7–7.7)
Neutrophils Relative %: 69 %
Platelet Count: 215 10*3/uL (ref 150–400)
RBC: 4.68 MIL/uL (ref 4.22–5.81)
RDW: 13 % (ref 11.5–15.5)
WBC Count: 5.7 10*3/uL (ref 4.0–10.5)
nRBC: 0 % (ref 0.0–0.2)

## 2023-12-12 LAB — CMP (CANCER CENTER ONLY)
ALT: 16 U/L (ref 0–44)
AST: 14 U/L — ABNORMAL LOW (ref 15–41)
Albumin: 4.3 g/dL (ref 3.5–5.0)
Alkaline Phosphatase: 79 U/L (ref 38–126)
Anion gap: 4 — ABNORMAL LOW (ref 5–15)
BUN: 17 mg/dL (ref 8–23)
CO2: 27 mmol/L (ref 22–32)
Calcium: 9 mg/dL (ref 8.9–10.3)
Chloride: 107 mmol/L (ref 98–111)
Creatinine: 1.27 mg/dL — ABNORMAL HIGH (ref 0.61–1.24)
GFR, Estimated: >60 mL/min (ref >60)
Glucose, Bld: 183 mg/dL — ABNORMAL HIGH (ref 70–99)
Potassium: 3.7 mmol/L (ref 3.5–5.1)
Sodium: 138 mmol/L (ref 135–145)
Total Bilirubin: 0.5 mg/dL (ref 0.0–1.2)
Total Protein: 7.3 g/dL (ref 6.5–8.1)

## 2023-12-14 ENCOUNTER — Ambulatory Visit: Payer: Medicare Other | Admitting: Hematology and Oncology

## 2023-12-14 ENCOUNTER — Other Ambulatory Visit: Payer: Medicare Other

## 2024-01-30 ENCOUNTER — Encounter: Payer: Self-pay | Admitting: *Deleted

## 2024-02-03 ENCOUNTER — Telehealth: Payer: Self-pay

## 2024-02-03 NOTE — Telephone Encounter (Signed)
 Notified Patient of completion of FMLA forms. Patient states that he will take forms to Employer. Copy of forms placed for pick-up as requested. No other needs or concerns noted at this time.

## 2024-03-07 ENCOUNTER — Other Ambulatory Visit: Payer: Self-pay | Admitting: Hematology and Oncology

## 2024-03-13 ENCOUNTER — Other Ambulatory Visit: Payer: Self-pay | Admitting: Hematology and Oncology

## 2024-03-13 DIAGNOSIS — C20 Malignant neoplasm of rectum: Secondary | ICD-10-CM

## 2024-03-13 NOTE — Progress Notes (Unsigned)
 [facility] [phone]   [phone]  PROGRESS NOTE  Patient Care Team: Pcp, No as PCP - General Federico Curtis ONEIDA MADISON, MD as Consulting Physician (Medical Oncology) Detra Darice LABOR, RN (Inactive) as Oncology Nurse Navigator (Oncology)  Hematological/Oncological History # Rectal Adenocarcinoma, T4N2M0. Stage IIIc, In Remission 01/16/2021: presented to the emergency department with abdominal pain. CT abdomen/pelvis showed concern for mass lesion with perforation and surrounding adenopathy. 01/19/2021: repeat CT scan showed worsening inflammatory changes around the complex rectal process 01/21/2021: flexible sigmoidoscopy performed, lesion at 12 cm from the anal verge.  biopsy showed concern for adenocarcinoma. 01/21/2021: patient underwent a laparoscopic assisted loop colostomy with Dr. Belinda. 02/09/2021: establish care with Dr. Federico  03/05/2021: Cycle 1 Day 1 of neoadjuvant FOLFOX 03/18/2021: Cycle 2 Day 1 of neoadjuvant FOLFOX 04/02/2021: Cycle 3 Day 1 of neoadjuvant FOLFOX 04/15/2021: Cycle 4 Day 1 of neoadjuvant FOLFOX 04/30/2021: Cycle 5 Day 1 of neoadjuvant FOLFOX 05/14/2021: Cycle 6 Day 1 of neoadjuvant FOLFOX 05/27/2021: Cycle 7 Day 1 of neoadjuvant FOLFOX 06/10/2021: Cycle 8 Day 1 of neoadjuvant FOLFOX delayed due to neutropenia.  06/22/2021: Cycle 8 Day 1 of neoadjuvant FOLFOX 07/13/2021: start of Chemoradiation with Xeloda  825 mg/m2 BID 08/21/2021: Last day of chemoradiation 09/09/2021: pre-surgical colonoscopy performed, no concerning abnormalities.  11/04/2021: LAR/DLI at [facility] with Dr. Florie 03/17/2022: Loop ileostomy takedown.   Interval History:  Curtis [name] 68 y.o. [name] with medical history significant for rectal adenocarcinoma who presents for a follow up visit. The patient's last visit was on 12/12/2023. In the interim since last visit he has had no major changes in his health.  On exam today Curtis [name] reports he has been well overall  since her last visit.  He feels like the blood pressure cuff was too tight this morning and he thinks that might be what is causing his high blood pressure.  He reports his energy levels are good and his appetite is strong.  He reports that unfortunately there have been some issues with his Social Security where he was overpaid and they are currently holding payments.  He notes that he is working about 4 days a week and is not sure how to make his financial situation improve.  He reports that his weight has been dropping slightly and he is down to 197 pounds, down 11 pounds from our last visit.  He reports that he eats all day on his days off because he does not mind the gastric distress but when he works he is very limited about his intake.  He is not having any nausea or vomiting, just diarrhea when he eats the wrong foods.  Reports he send no overt signs of bleeding, bruising, or dark stools.  He reports he does take Imodium to help with his diarrhea.  He would like to delay his CT scan until November noting he is concerned about his financial situation.  He otherwise denies any fevers, chills, sweats.  Full 10 point ROS is otherwise negative.  MEDICAL HISTORY:  Past Medical History:  Diagnosis Date   Chronic [name] disease    History of [name] stones    Rectosigmoid cancer (HCC)     SURGICAL HISTORY: Past Surgical History:  Procedure Laterality Date   BIOPSY  01/21/2021   Procedure: BIOPSY;  Surgeon: Shila Gustav GAILS, MD;  Location: [facility] ENDOSCOPY;  Service: Endoscopy;;   COLON RESECTION N/A 01/21/2021   Procedure: LAPAROSCOPIC ASSISTED LOOP COLOSTOMY;  Surgeon: Belinda Cough, MD;  Location[facility]  OR;  Service: General;  Laterality: N/A;   COLON RESECTION SIGMOID  11/04/2021   COLONOSCOPY     CYSTOSCOPY W/ URETERAL STENT PLACEMENT Right 01/03/2022   Procedure: CYSTOSCOPY WITH  URETERAL STENT PLACEMENT;  Surgeon: Rosalind Zachary NOVAK, MD;  Location: [facility] OR;  Service: Urology;  Laterality: Right;    CYSTOSCOPY/URETEROSCOPY/HOLMIUM LASER/STENT PLACEMENT Right 02/09/2022   Procedure: CYSTOSCOPY/URETEROSCOPY/STENT EXCHANGE;  Surgeon: Rosalind Zachary NOVAK, MD;  Location: [facility] ORS;  Service: Urology;  Laterality: Right;  30 MINUTES   FLEXIBLE SIGMOIDOSCOPY N/A 01/21/2021   Procedure: FLEXIBLE SIGMOIDOSCOPY;  Surgeon: Shila Gustav GAILS, MD;  Location: [facility] ENDOSCOPY;  Service: Endoscopy;  Laterality: N/A;   IR IMAGING GUIDED PORT INSERTION  03/04/2021   IR REMOVAL TUN ACCESS W/ PORT W/O FL MOD SED  04/21/2022   SUBMUCOSAL TATTOO INJECTION  01/21/2021   Procedure: SUBMUCOSAL TATTOO INJECTION;  Surgeon: Shila Gustav GAILS, MD;  Location: [facility] ENDOSCOPY;  Service: Endoscopy;;    SOCIAL HISTORY: Social History   Socioeconomic History   Marital status: Divorced    Spouse name: Not on file   Number of children: Not on file   Years of education: Not on file   Highest education level: Not on file  Occupational History   Not on file  Tobacco Use   Smoking status: Some Days    Current packs/day: 0.00    Types: Cigarettes    Last attempt to quit: 01/05/2022    Years since quitting: 2.1   Smokeless tobacco: Never  Vaping Use   Vaping status: Never Used  Substance and Sexual Activity   Alcohol use: Never   Drug use: Never   Sexual activity: Not Currently  Other Topics Concern   Not on file  Social History Narrative   Not on file   Social Drivers of Health   Financial Resource Strain: Low Risk  (02/09/2021)   Overall Financial Resource Strain (CARDIA)    Difficulty of Paying Living Expenses: Not very hard  Food Insecurity: No Food Insecurity (02/09/2021)   Hunger Vital Sign    Worried About Running Out of Food in the Last Year: Never true    Ran Out of Food in the Last Year: Never true  Transportation Needs: No Transportation Needs (11/06/2021)   Received from [facility]   PRAPARE - Transportation    In the past 12 months, has lack of transportation kept you from  medical appointments or from getting medications?: No    Lack of Transportation (Non-Medical): No  Physical Activity: Not on file  Stress: Not on file  Social Connections: Not on file  Intimate Partner Violence: Not At Risk (02/26/2021)   Humiliation, Afraid, Rape, and Kick questionnaire    Fear of Current or Ex-Partner: No    Emotionally Abused: No    Physically Abused: No    Sexually Abused: No    FAMILY HISTORY: Family History  Problem Relation Age of Onset   Hypertension Mother    Hypertension Father    Breast cancer Sister    Colon cancer Neg Hx    Colon polyps Neg Hx    Esophageal cancer Neg Hx    Rectal cancer Neg Hx    Stomach cancer Neg Hx     ALLERGIES:  is allergic to cefazolin , leucovorin , and oxaliplatin .  MEDICATIONS:  Current Outpatient Medications  Medication Sig Dispense Refill   acetaminophen  (TYLENOL ) 500 MG tablet Take 1,000 mg by mouth every 8 (eight) hours as needed for headache or moderate pain.  amLODipine  (NORVASC ) 5 MG tablet Take 1 tablet by mouth once daily 90 tablet 0   loperamide (IMODIUM) 2 MG capsule Take 4 mg by mouth daily as needed for diarrhea or loose stools. Up to 6 /day     Current Facility-Administered Medications  Medication Dose Route Frequency Provider Last Rate Last Admin   0.9 %  sodium chloride  infusion  500 mL Intravenous Once Nandigam, Kavitha V, MD        REVIEW OF SYSTEMS:   Constitutional: ( - ) fevers, ( - )  chills , ( - ) night sweats Eyes: ( - ) blurriness of vision, ( - ) double vision, ( - ) watery eyes Ears, nose, mouth, throat, and face: ( - ) mucositis, ( - ) sore throat Respiratory: ( - ) cough, ( - ) dyspnea, ( - ) wheezes Cardiovascular: ( - ) palpitation, ( - ) chest discomfort, ( - ) lower extremity swelling Gastrointestinal:  ( - ) nausea, ( - ) heartburn, ( - ) change in bowel habits Skin: ( - ) abnormal skin rashes Lymphatics: ( - ) new lymphadenopathy, ( - ) easy bruising Neurological: ( - )  numbness, ( - ) tingling, ( - ) new weaknesses Behavioral/Psych: ( - ) mood change, ( - ) new changes  All other systems were reviewed with the patient and are negative.  PHYSICAL EXAMINATION: ECOG PERFORMANCE STATUS: 1 - Symptomatic but completely ambulatory  Vitals:   03/14/24 1032  BP: (!) 170/99  Pulse: 78  Resp: 14  Temp: 97.7 F (36.5 C)  SpO2: 100%   Filed Weights   03/14/24 1032  Weight: 197 lb 1.6 oz (89.4 kg)    GENERAL: Well-appearing middle-aged Curtis [name], alert, no distress and comfortable SKIN: skin color, texture, turgor are normal, no rashes or significant lesions EYES: conjunctiva are pink and non-injected, sclera clear LUNGS: clear to auscultation and percussion with normal breathing effort HEART: regular rate & rhythm and no murmurs and no lower extremity edema ABDOMEN: soft, non-tender, non-distended, normal bowel sounds.   Musculoskeletal: no cyanosis of digits and no clubbing  PSYCH: alert & oriented x 3, fluent speech NEURO: no focal motor/sensory deficits  LABORATORY DATA:  I have reviewed the data as listed    Latest Ref Rng & Units 03/14/2024   10:09 AM 12/12/2023   11:15 AM 09/07/2023    8:56 AM  CBC  WBC 4.0 - 10.5 K/uL 5.4  5.7  6.2   Hemoglobin 13.0 - 17.0 g/dL 86.7  86.1  85.4   Hematocrit 39.0 - 52.0 % 37.0  40.0  43.0   Platelets 150 - 400 K/uL 203  215  247        Latest Ref Rng & Units 03/14/2024   10:09 AM 12/12/2023   11:15 AM 09/07/2023    8:56 AM  CMP  Glucose 70 - 99 mg/dL 634  816  751   BUN 8 - 23 mg/dL 14  17  17    Creatinine 0.61 - 1.24 mg/dL 8.90  8.72  8.70   Sodium 135 - 145 mmol/L 135  138  136   Potassium 3.5 - 5.1 mmol/L 3.7  3.7  3.9   Chloride 98 - 111 mmol/L 105  107  102   CO2 22 - 32 mmol/L 23  27  27    Calcium  8.9 - 10.3 mg/dL 9.0  9.0  9.4   Total Protein 6.5 - 8.1 g/dL 6.9  7.3  7.5   Total  Bilirubin 0.0 - 1.2 mg/dL 0.3  0.5  0.4   Alkaline Phos 38 - 126 U/L 111  79  101   AST 15 - 41 U/L 12  14  13     ALT 0 - 44 U/L 11  16  17      RADIOGRAPHIC STUDIES: I have personally reviewed the radiological images as listed and agreed with the findings in the report: no evidence of metastatic diease in the chest. MRI abdomen does not show metastatic disease.   No results found.  ASSESSMENT & PLAN Curtis [name] 68 y.o. [name] with medical history significant for rectal adenocarcinoma who presents for a follow up visit.   At this time the patient's findings are most consistent with localized rectal adenocarcinoma.  MRI of the pelvis confirmed this is a T4 N2 M0 stage IIIc cancer.  As such we will plan to proceed with neoadjuvant chemotherapy, followed by chemoradiation, followed by reevaluation for consideration of transabdominal resection.  The initial treatment of choice would be neoadjuvant FOLFOX chemotherapy for 8 cycles.  Once this is complete we will transition to chemoradiation with p.o. capecitabine  therapy.  When these are complete we will restage for consideration of surgical resection.  # Rectal Adenocarcinoma, T4N2M0 Stage IIIc, in Remission.  --  staging with a CT scan of the chest and MRI abdomen/pelvis findings are consistent with localized disease.   --CEA modestly elevated at 17.44 on 02/09/2021 prior to start of therapy. --Given that this is adenocarcinoma of the rectum I would recommend proceeding with neoadjuvant chemotherapy, followed by chemoradiation, followed by definitive surgery. Case discussed at [facility] --patient completed 8 cycles of neoadjuvant FOLFOX --started chemoradiation therapy with Xeloda  on 07/13/2021. Last treatment on 08/21/2021.  --last day of chemoradiation on 08/21/2021  Plan:   -- underwent LAR/DLI on 11/04/2021 at [facility] with Dr. Florie.  Underwent ileostomy reversal on 03/17/2022 --plan for CT C/A/P q 6-12 months (x 5 years) per NCCN recommendations. Last scan in March 2025. Next CT scan due Sept 2025 --continue clinic visits q 3 months with CEA q 3-6  months x 2 years, then q 6-12 months for a total of 5 years. --post-operative colonoscopy on 10/10/2023. Repeat recommended in 2028.  -- labs today show WBC 5.4, hemoglobin 13.2, MCV 84.5, platelets 203 --RTC in 12 weeks.   # AKI --Cr 1.09 today, stable --encourage hydration  # Hyperglyclemia-resolved --BG elevated, patient has no history of DM type II. Unclear why this occurred.  --Patient had spike in blood glucose up to 365 -- abnormal elevations occurred during chemotherapy treatment.  --off glipizide   --continue to monitor.   #Supportive Care -- chemotherapy education complete -- port removed on 04/21/2022 -- no pain medication required at this time.   No orders of the defined types were placed in this encounter.   All questions were answered. The patient knows to call the clinic with any problems, questions or concerns.  I have spent a total of 30 minutes minutes of face-to-face and non-face-to-face time, preparing to see the patient, obtaining and/or reviewing separately obtained history, performing a medically appropriate examination, counseling and educating the patient, ordering medications,  communicating with other health care professionals, documenting clinical information in the electronic health record, and care coordination.   Curtis IVAR Kidney, MD Department of Hematology/Oncology [facility] Phone: [phone] Pager: [phone] Email: [email or web address]   03/14/2024 6:51 PM  Hofheinz RD, Roney FALCON, Post S, Matzdorff A, Laechelt S, Hartmann JT, Mller L, Link  H, Moehler M, Kettner E, Fritz E, Hieber U, Lindemann HW, Grunewald M, Kremers S, Constantin C, Hipp M, Hartung G, Gencer D, Kienle P, Norway I, Hochhaus A. Chemoradiotherapy with capecitabine  versus fluorouracil  for locally advanced rectal cancer: a randomised, multicentre, non-inferiority, phase 3 trial. Lancet Oncol. 2012 Jun;13(6):579-88.

## 2024-03-14 ENCOUNTER — Inpatient Hospital Stay (HOSPITAL_BASED_OUTPATIENT_CLINIC_OR_DEPARTMENT_OTHER): Admitting: Hematology and Oncology

## 2024-03-14 ENCOUNTER — Inpatient Hospital Stay: Attending: Hematology and Oncology

## 2024-03-14 VITALS — BP 170/99 | HR 78 | Temp 97.7°F | Resp 14 | Wt 197.1 lb

## 2024-03-14 DIAGNOSIS — Z881 Allergy status to other antibiotic agents status: Secondary | ICD-10-CM | POA: Diagnosis not present

## 2024-03-14 DIAGNOSIS — R030 Elevated blood-pressure reading, without diagnosis of hypertension: Secondary | ICD-10-CM | POA: Insufficient documentation

## 2024-03-14 DIAGNOSIS — F17210 Nicotine dependence, cigarettes, uncomplicated: Secondary | ICD-10-CM | POA: Insufficient documentation

## 2024-03-14 DIAGNOSIS — N179 Acute kidney failure, unspecified: Secondary | ICD-10-CM | POA: Insufficient documentation

## 2024-03-14 DIAGNOSIS — Z8249 Family history of ischemic heart disease and other diseases of the circulatory system: Secondary | ICD-10-CM | POA: Insufficient documentation

## 2024-03-14 DIAGNOSIS — Z803 Family history of malignant neoplasm of breast: Secondary | ICD-10-CM | POA: Insufficient documentation

## 2024-03-14 DIAGNOSIS — Z87442 Personal history of urinary calculi: Secondary | ICD-10-CM | POA: Insufficient documentation

## 2024-03-14 DIAGNOSIS — C20 Malignant neoplasm of rectum: Secondary | ICD-10-CM | POA: Insufficient documentation

## 2024-03-14 DIAGNOSIS — Z79899 Other long term (current) drug therapy: Secondary | ICD-10-CM | POA: Insufficient documentation

## 2024-03-14 DIAGNOSIS — Z923 Personal history of irradiation: Secondary | ICD-10-CM | POA: Insufficient documentation

## 2024-03-14 DIAGNOSIS — Z95828 Presence of other vascular implants and grafts: Secondary | ICD-10-CM

## 2024-03-14 DIAGNOSIS — R197 Diarrhea, unspecified: Secondary | ICD-10-CM | POA: Insufficient documentation

## 2024-03-14 LAB — CBC WITH DIFFERENTIAL (CANCER CENTER ONLY)
Abs Immature Granulocytes: 0.01 K/uL (ref 0.00–0.07)
Basophils Absolute: 0 K/uL (ref 0.0–0.1)
Basophils Relative: 1 %
Eosinophils Absolute: 0.3 K/uL (ref 0.0–0.5)
Eosinophils Relative: 6 %
HCT: 37 % — ABNORMAL LOW (ref 39.0–52.0)
Hemoglobin: 13.2 g/dL (ref 13.0–17.0)
Immature Granulocytes: 0 %
Lymphocytes Relative: 20 %
Lymphs Abs: 1.1 K/uL (ref 0.7–4.0)
MCH: 30.1 pg (ref 26.0–34.0)
MCHC: 35.7 g/dL (ref 30.0–36.0)
MCV: 84.5 fL (ref 80.0–100.0)
Monocytes Absolute: 0.3 K/uL (ref 0.1–1.0)
Monocytes Relative: 5 %
Neutro Abs: 3.7 K/uL (ref 1.7–7.7)
Neutrophils Relative %: 68 %
Platelet Count: 203 K/uL (ref 150–400)
RBC: 4.38 MIL/uL (ref 4.22–5.81)
RDW: 12.6 % (ref 11.5–15.5)
WBC Count: 5.4 K/uL (ref 4.0–10.5)
nRBC: 0 % (ref 0.0–0.2)

## 2024-03-14 LAB — CMP (CANCER CENTER ONLY)
ALT: 11 U/L (ref 0–44)
AST: 12 U/L — ABNORMAL LOW (ref 15–41)
Albumin: 4.2 g/dL (ref 3.5–5.0)
Alkaline Phosphatase: 111 U/L (ref 38–126)
Anion gap: 7 (ref 5–15)
BUN: 14 mg/dL (ref 8–23)
CO2: 23 mmol/L (ref 22–32)
Calcium: 9 mg/dL (ref 8.9–10.3)
Chloride: 105 mmol/L (ref 98–111)
Creatinine: 1.09 mg/dL (ref 0.61–1.24)
GFR, Estimated: >60 mL/min (ref >60)
Glucose, Bld: 365 mg/dL — ABNORMAL HIGH (ref 70–99)
Potassium: 3.7 mmol/L (ref 3.5–5.1)
Sodium: 135 mmol/L (ref 135–145)
Total Bilirubin: 0.3 mg/dL (ref 0.0–1.2)
Total Protein: 6.9 g/dL (ref 6.5–8.1)

## 2024-03-14 LAB — MAGNESIUM: Magnesium: 1.6 mg/dL — ABNORMAL LOW (ref 1.7–2.4)

## 2024-03-14 LAB — RETIC PANEL
Immature Retic Fract: 10.5 % (ref 2.3–15.9)
RBC.: 4.43 MIL/uL (ref 4.22–5.81)
Retic Count, Absolute: 71.8 K/uL (ref 19.0–186.0)
Retic Ct Pct: 1.6 % (ref 0.4–3.1)
Reticulocyte Hemoglobin: 33.6 pg (ref >27.9)

## 2024-03-14 LAB — IRON AND IRON BINDING CAPACITY (CC-WL,HP ONLY)
Iron: 43 ug/dL — ABNORMAL LOW (ref 45–182)
Saturation Ratios: 15 % — ABNORMAL LOW (ref 17.9–39.5)
TIBC: 288 ug/dL (ref 250–450)
UIBC: 245 ug/dL (ref 117–376)

## 2024-03-14 LAB — FERRITIN: Ferritin: 225 ng/mL (ref 24–336)

## 2024-03-15 ENCOUNTER — Telehealth: Payer: Self-pay | Admitting: *Deleted

## 2024-03-15 ENCOUNTER — Other Ambulatory Visit: Payer: Self-pay | Admitting: *Deleted

## 2024-03-15 DIAGNOSIS — C20 Malignant neoplasm of rectum: Secondary | ICD-10-CM

## 2024-03-15 NOTE — Telephone Encounter (Signed)
 TCT patient regarding recent lab results.  Spoke to him. Advised that his creatinine has improved to normal, down to 1.09. Unfortunately his blood sugars are quite elevated, currently above 300., at 365. Asked pt to check with his PCP for follow up. Pt states he does not have a primary care provider. Advised that we can do a referral for a PCP. He is agreeable to that and also will come in next week for repeat CMP. Pt to call with time/date for labs next week. Referral for PCP has been made.

## 2024-03-19 ENCOUNTER — Other Ambulatory Visit: Payer: Self-pay | Admitting: *Deleted

## 2024-03-19 DIAGNOSIS — C20 Malignant neoplasm of rectum: Secondary | ICD-10-CM

## 2024-03-20 ENCOUNTER — Inpatient Hospital Stay

## 2024-03-20 ENCOUNTER — Other Ambulatory Visit: Payer: Self-pay | Admitting: Hematology and Oncology

## 2024-03-20 DIAGNOSIS — R739 Hyperglycemia, unspecified: Secondary | ICD-10-CM

## 2024-03-20 DIAGNOSIS — C20 Malignant neoplasm of rectum: Secondary | ICD-10-CM | POA: Diagnosis not present

## 2024-03-20 LAB — CMP (CANCER CENTER ONLY)
ALT: 10 U/L (ref 0–44)
AST: 14 U/L — ABNORMAL LOW (ref 15–41)
Albumin: 4.1 g/dL (ref 3.5–5.0)
Alkaline Phosphatase: 79 U/L (ref 38–126)
Anion gap: 5 (ref 5–15)
BUN: 17 mg/dL (ref 8–23)
CO2: 26 mmol/L (ref 22–32)
Calcium: 8.7 mg/dL — ABNORMAL LOW (ref 8.9–10.3)
Chloride: 107 mmol/L (ref 98–111)
Creatinine: 1.25 mg/dL — ABNORMAL HIGH (ref 0.61–1.24)
GFR, Estimated: >60 mL/min (ref >60)
Glucose, Bld: 138 mg/dL — ABNORMAL HIGH (ref 70–99)
Potassium: 3.6 mmol/L (ref 3.5–5.1)
Sodium: 138 mmol/L (ref 135–145)
Total Bilirubin: 0.4 mg/dL (ref 0.0–1.2)
Total Protein: 6.6 g/dL (ref 6.5–8.1)

## 2024-03-20 LAB — HEMOGLOBIN A1C
Hgb A1c MFr Bld: 13 % — ABNORMAL HIGH (ref 4.8–5.6)
Mean Plasma Glucose: 326.4 mg/dL

## 2024-03-21 ENCOUNTER — Ambulatory Visit: Payer: Self-pay | Admitting: *Deleted

## 2024-03-21 NOTE — Telephone Encounter (Signed)
 TCT patient regarding recent lab results.  Spoke with him. Advised  that his sugar levels have improved but we also checked his Hgb A1c which was 13.0. Advised that he needs to get established with a PCP. Pt states he has not insurance for this until November 2025. Advised on dietary restrictions and will check with Dr. Federico regarding follow up in the meantime, until he can establish with a PCP. Pt voiced understanding.  Dr. Federico made aware.

## 2024-03-21 NOTE — Telephone Encounter (Signed)
-----   Message from Norleen ONEIDA Kidney IV sent at 03/21/2024  1:26 PM EDT ----- Please let Mr. Ta know that his sugar levels have improved but we also checked his Hgb A1c which was 13.0. He needs to check in with his PCP to evaluate this further. Also please fax this result to  his PCP.  ----- Message ----- From: Rebecka, [facility] [place] Sent: 03/20/2024  10:38 AM EDT To: Norleen ONEIDA Kidney MADISON, MD

## 2024-03-23 ENCOUNTER — Other Ambulatory Visit: Payer: Self-pay | Admitting: *Deleted

## 2024-03-23 DIAGNOSIS — C20 Malignant neoplasm of rectum: Secondary | ICD-10-CM

## 2024-03-23 DIAGNOSIS — R739 Hyperglycemia, unspecified: Secondary | ICD-10-CM

## 2024-03-27 ENCOUNTER — Telehealth: Payer: Self-pay

## 2024-03-27 NOTE — Telephone Encounter (Signed)
 [facility] Clinical Social Work  Clinical Social Work was referred by medical provider for need for community resources (PCP/Diabetes Management).  Clinical Social Worker attempted to contact patient by phone to offer support and assess for needs. Patient is currently uninsured and will be until approx. November.   Interventions: CSW sent message to [facility] team to see if patient is eligible for services. CSW asked patient's nurse to place an order for [facility] in case patient is eligible for services.  CSW left VM with purpose of call.       Follow Up Plan:  CSW will make a second attempt to reach patient.     Lizbeth Sprague, LCSW  Clinical Social Worker [facility]

## 2024-03-29 ENCOUNTER — Other Ambulatory Visit: Payer: Self-pay | Admitting: *Deleted

## 2024-03-29 ENCOUNTER — Telehealth: Payer: Self-pay

## 2024-03-29 NOTE — Telephone Encounter (Signed)
 [facility] Clinical Social Work  Clinical Social Work was referred by medical provider for need for community resources.  Clinical Social Worker attempted to contact patient by phone x2 to offer support and assess for needs. CSW received information that patient is not eligible for [facility] services. CSW inquired reason for ineligibly to determine if services would be possible in the future. Patient's nurse will still put order in just in case.   Interventions: CSW left VM requesting call back. Dur to urgent request, CSW also sent email on file. No DPR notice to talk to additional contacts listed in chart.  CSW will await a response.   Lizbeth Sprague, LCSW  Clinical Social Worker [facility]

## 2024-06-07 ENCOUNTER — Other Ambulatory Visit: Payer: Self-pay | Admitting: Hematology and Oncology

## 2024-06-08 ENCOUNTER — Encounter: Payer: Self-pay | Admitting: Hematology and Oncology

## 2024-06-18 ENCOUNTER — Ambulatory Visit (HOSPITAL_COMMUNITY)
Admission: RE | Admit: 2024-06-18 | Discharge: 2024-06-18 | Disposition: A | Discharge status: Home / Self Care | Source: Ambulatory Visit | Attending: Hematology and Oncology | Admitting: Hematology and Oncology

## 2024-06-18 DIAGNOSIS — C20 Malignant neoplasm of rectum: Secondary | ICD-10-CM | POA: Insufficient documentation

## 2024-06-18 MED ORDER — IOHEXOL 300 MG/ML  SOLN
100.0000 mL | Freq: Once | INTRAMUSCULAR | Status: AC | PRN
  Administered 2024-06-18: 100 mL via INTRAVENOUS

## 2024-06-24 ENCOUNTER — Other Ambulatory Visit: Payer: Self-pay | Admitting: Hematology and Oncology

## 2024-06-24 DIAGNOSIS — C20 Malignant neoplasm of rectum: Secondary | ICD-10-CM

## 2024-06-25 ENCOUNTER — Telehealth: Payer: Self-pay | Admitting: *Deleted

## 2024-06-25 ENCOUNTER — Inpatient Hospital Stay (HOSPITAL_BASED_OUTPATIENT_CLINIC_OR_DEPARTMENT_OTHER): Admitting: Hematology and Oncology

## 2024-06-25 ENCOUNTER — Inpatient Hospital Stay

## 2024-06-25 DIAGNOSIS — C20 Malignant neoplasm of rectum: Secondary | ICD-10-CM

## 2024-06-25 DIAGNOSIS — R739 Hyperglycemia, unspecified: Secondary | ICD-10-CM

## 2024-06-25 NOTE — Telephone Encounter (Signed)
 TCT pt as he had not shown up for his appts today. Spoke to him. He states he forgot due to his work schedule over the holiday. Advised not to worry-we will get him re-scheduled to the next available. He voiced understanding.  Dr. Federico and scheduling made aware.

## 2024-07-04 ENCOUNTER — Telehealth: Payer: Self-pay

## 2024-07-04 NOTE — Telephone Encounter (Signed)
 Left VM for patient to call [id number] to set up new pt appt.

## 2024-07-09 ENCOUNTER — Encounter: Payer: Self-pay | Admitting: Hematology and Oncology

## 2024-08-05 NOTE — Progress Notes (Unsigned)
 " [facility] [phone]   [phone]  PROGRESS NOTE  Patient Care Team: Pcp, No as PCP - General [name], MD as Consulting Physician (Medical Oncology) Detra Darice LABOR, RN (Inactive) as Oncology Nurse Navigator (Oncology)  Hematological/Oncological History # Rectal Adenocarcinoma, T4N2M0. Stage IIIc, In Remission 01/16/2021: presented to the emergency department with abdominal pain. CT abdomen/pelvis showed concern for mass lesion with perforation and surrounding adenopathy. 01/19/2021: repeat CT scan showed worsening inflammatory changes around the complex rectal process 01/21/2021: flexible sigmoidoscopy performed, lesion at 12 cm from the anal verge.  biopsy showed concern for adenocarcinoma. 01/21/2021: patient underwent a laparoscopic assisted loop colostomy with Dr. Belinda. 02/09/2021: establish care with Dr. Federico  03/05/2021: Cycle 1 Day 1 of neoadjuvant FOLFOX 03/18/2021: Cycle 2 Day 1 of neoadjuvant FOLFOX 04/02/2021: Cycle 3 Day 1 of neoadjuvant FOLFOX 04/15/2021: Cycle 4 Day 1 of neoadjuvant FOLFOX 04/30/2021: Cycle 5 Day 1 of neoadjuvant FOLFOX 05/14/2021: Cycle 6 Day 1 of neoadjuvant FOLFOX 05/27/2021: Cycle 7 Day 1 of neoadjuvant FOLFOX 06/10/2021: Cycle 8 Day 1 of neoadjuvant FOLFOX delayed due to neutropenia.  06/22/2021: Cycle 8 Day 1 of neoadjuvant FOLFOX 07/13/2021: start of Chemoradiation with Xeloda  825 mg/m2 BID 08/21/2021: Last day of chemoradiation 09/09/2021: pre-surgical colonoscopy performed, no concerning abnormalities.  11/04/2021: LAR/DLI at [facility] with Dr. Florie 03/17/2022: Loop ileostomy takedown.   Interval History:  Curtis [name] 69 y.o. male with medical history significant for rectal adenocarcinoma who presents for a follow up visit. The patient's last visit was on 03/14/2024. In the interim since last visit he has had no major changes in his health.  On exam today Curtis [name] reports he has been doing quite  well and has been very busy at work.  He reports that his [employer] location will be moving and he will coming back to about 3 days/week.  He reports that overall he is feeling strong with good energy levels and good appetite.  He has had no trouble with nausea, vomiting, or diarrhea.  He is able to manage his diet and has good control of his bowels.  Denies any dark stools or blood in the stool.  He reports no runny nose, sore throat, cough.  Denies any fevers, chills, sweats.  He has not received a flu shot and prefers to wear a mask.  Overall he reports he feels well and has no questions concerns or complaints today.  Full 10 point ROS is otherwise negative.  MEDICAL HISTORY:  Past Medical History:  Diagnosis Date   Chronic kidney disease    History of kidney stones    Rectosigmoid cancer (HCC)     SURGICAL HISTORY: Past Surgical History:  Procedure Laterality Date   BIOPSY  01/21/2021   Procedure: BIOPSY;  Surgeon: Shila Gustav GAILS, MD;  Location: [facility] ENDOSCOPY;  Service: Endoscopy;;   COLON RESECTION N/A 01/21/2021   Procedure: LAPAROSCOPIC ASSISTED LOOP COLOSTOMY;  Surgeon: Belinda Cough, MD;  Location: [facility] OR;  Service: General;  Laterality: N/A;   COLON RESECTION SIGMOID  11/04/2021   COLONOSCOPY     CYSTOSCOPY W/ URETERAL STENT PLACEMENT Right 01/03/2022   Procedure: CYSTOSCOPY WITH  URETERAL STENT PLACEMENT;  Surgeon: Rosalind Zachary NOVAK, MD;  Location: [facility] OR;  Service: Urology;  Laterality: Right;   CYSTOSCOPY/URETEROSCOPY/HOLMIUM LASER/STENT PLACEMENT Right 02/09/2022   Procedure: CYSTOSCOPY/URETEROSCOPY/STENT EXCHANGE;  Surgeon: Rosalind Zachary NOVAK, MD;  Location: [facility] ORS;  Service: Urology;  Laterality: Right;  30 MINUTES   FLEXIBLE SIGMOIDOSCOPY N/A  01/21/2021   Procedure: FLEXIBLE SIGMOIDOSCOPY;  Surgeon: Shila Gustav GAILS, MD;  Location: [facility] ENDOSCOPY;  Service: Endoscopy;  Laterality: N/A;   IR IMAGING GUIDED PORT INSERTION  03/04/2021   IR REMOVAL TUN ACCESS W/ PORT W/O FL  MOD SED  04/21/2022   SUBMUCOSAL TATTOO INJECTION  01/21/2021   Procedure: SUBMUCOSAL TATTOO INJECTION;  Surgeon: Shila Gustav GAILS, MD;  Location: [facility] ENDOSCOPY;  Service: Endoscopy;;    SOCIAL HISTORY: Social History   Socioeconomic History   Marital status: Divorced    Spouse name: Not on file   Number of children: Not on file   Years of education: Not on file   Highest education level: Not on file  Occupational History   Not on file  Tobacco Use   Smoking status: Some Days    Current packs/day: 0.00    Types: Cigarettes    Last attempt to quit: 01/05/2022    Years since quitting: 2.5   Smokeless tobacco: Never  Vaping Use   Vaping status: Never Used  Substance and Sexual Activity   Alcohol use: Never   Drug use: Never   Sexual activity: Not Currently  Other Topics Concern   Not on file  Social History Narrative   Not on file   Social Drivers of Health   Tobacco Use: High Risk (10/12/2023)   Patient History    Smoking Tobacco Use: Some Days    Smokeless Tobacco Use: Never    Passive Exposure: Not on file  Financial Resource Strain: Not on file  Food Insecurity: Not on file  Transportation Needs: No Transportation Needs (11/06/2021)   Received from [facility] - [facility]    In the past 12 months, has lack of transportation kept you from medical appointments or from getting medications?: No    Lack of Transportation (Non-Medical): No  Physical Activity: Not on file  Stress: Not on file  Social Connections: Not on file  Intimate Partner Violence: Not on file  Depression (PHQ2-9): Low Risk (08/06/2024)   Depression (PHQ2-9)    PHQ-2 Score: 0  Alcohol Screen: Not on file  Housing: Not on file  Utilities: Not on file  Health Literacy: Not on file    FAMILY HISTORY: Family History  Problem Relation Age of Onset   Hypertension Mother    Hypertension Father    Breast cancer Sister    Colon cancer Neg Hx    Colon polyps Neg  Hx    Esophageal cancer Neg Hx    Rectal cancer Neg Hx    Stomach cancer Neg Hx     ALLERGIES:  is allergic to cefazolin , leucovorin , and oxaliplatin .  MEDICATIONS:  Current Outpatient Medications  Medication Sig Dispense Refill   acetaminophen  (TYLENOL ) 500 MG tablet Take 1,000 mg by mouth every 8 (eight) hours as needed for headache or moderate pain.     amLODipine  (NORVASC ) 5 MG tablet Take 1 tablet by mouth once daily 90 tablet 0   loperamide (IMODIUM) 2 MG capsule Take 4 mg by mouth daily as needed for diarrhea or loose stools. Up to 6 /day     Current Facility-Administered Medications  Medication Dose Route Frequency Provider Last Rate Last Admin   0.9 %  sodium chloride  infusion  500 mL Intravenous Once Nandigam, Kavitha V, MD        REVIEW OF SYSTEMS:   Constitutional: ( - ) fevers, ( - )  chills , ( - ) night sweats Eyes: ( - )  blurriness of vision, ( - ) double vision, ( - ) watery eyes Ears, nose, mouth, throat, and face: ( - ) mucositis, ( - ) sore throat Respiratory: ( - ) cough, ( - ) dyspnea, ( - ) wheezes Cardiovascular: ( - ) palpitation, ( - ) chest discomfort, ( - ) lower extremity swelling Gastrointestinal:  ( - ) nausea, ( - ) heartburn, ( - ) change in bowel habits Skin: ( - ) abnormal skin rashes Lymphatics: ( - ) new lymphadenopathy, ( - ) easy bruising Neurological: ( - ) numbness, ( - ) tingling, ( - ) new weaknesses Behavioral/Psych: ( - ) mood change, ( - ) new changes  All other systems were reviewed with the patient and are negative.  PHYSICAL EXAMINATION: ECOG PERFORMANCE STATUS: 1 - Symptomatic but completely ambulatory  Vitals:   08/06/24 0858 08/06/24 0903  BP: (!) 146/83 (!) 144/76  Pulse: 73   Resp: 14   Temp: 97.9 F (36.6 C)   SpO2: 100%     Filed Weights   08/06/24 0858  Weight: 205 lb 3.2 oz (93.1 kg)     GENERAL: Well-appearing middle-aged Iranian male, alert, no distress and comfortable SKIN: skin color, texture, turgor  are normal, no rashes or significant lesions EYES: conjunctiva are pink and non-injected, sclera clear LUNGS: clear to auscultation and percussion with normal breathing effort HEART: regular rate & rhythm and no murmurs and no lower extremity edema ABDOMEN: soft, non-tender, non-distended, normal bowel sounds.   Musculoskeletal: no cyanosis of digits and no clubbing  PSYCH: alert & oriented x 3, fluent speech NEURO: no focal motor/sensory deficits  LABORATORY DATA:  I have reviewed the data as listed    Latest Ref Rng & Units 08/06/2024    8:34 AM 03/14/2024   10:09 AM 12/12/2023   11:15 AM  CBC  WBC 4.0 - 10.5 K/uL 4.9  5.4  5.7   Hemoglobin 13.0 - 17.0 g/dL 85.5  86.7  86.1   Hematocrit 39.0 - 52.0 % 41.7  37.0  40.0   Platelets 150 - 400 K/uL 206  203  215        Latest Ref Rng & Units 08/06/2024    8:34 AM 03/20/2024    9:58 AM 03/14/2024   10:09 AM  CMP  Glucose 70 - 99 mg/dL 695  861  634   BUN 8 - 23 mg/dL 12  17  14    Creatinine 0.61 - 1.24 mg/dL 8.83  8.74  8.90   Sodium 135 - 145 mmol/L 138  138  135   Potassium 3.5 - 5.1 mmol/L 4.0  3.6  3.7   Chloride 98 - 111 mmol/L 103  107  105   CO2 22 - 32 mmol/L 24  26  23    Calcium  8.9 - 10.3 mg/dL 9.3  8.7  9.0   Total Protein 6.5 - 8.1 g/dL 7.5  6.6  6.9   Total Bilirubin 0.0 - 1.2 mg/dL 0.3  0.4  0.3   Alkaline Phos 38 - 126 U/L 109  79  111   AST 15 - 41 U/L 19  14  12    ALT 0 - 44 U/L 19  10  11      RADIOGRAPHIC STUDIES: I have personally reviewed the radiological images as listed and agreed with the findings in the report: no evidence of metastatic diease in the chest. MRI abdomen does not show metastatic disease.   No results found.  ASSESSMENT & PLAN  Curtis [name] 69 y.o. male with medical history significant for rectal adenocarcinoma who presents for a follow up visit.   At this time the patient's findings are most consistent with localized rectal adenocarcinoma.  MRI of the pelvis confirmed this is a T4 N2 M0  stage IIIc cancer.  As such we will plan to proceed with neoadjuvant chemotherapy, followed by chemoradiation, followed by reevaluation for consideration of transabdominal resection.  The initial treatment of choice would be neoadjuvant FOLFOX chemotherapy for 8 cycles.  Once this is complete we will transition to chemoradiation with p.o. capecitabine  therapy.  When these are complete we will restage for consideration of surgical resection.  # Rectal Adenocarcinoma, T4N2M0 Stage IIIc, in Remission.  --  staging with a CT scan of the chest and MRI abdomen/pelvis findings are consistent with localized disease.   --CEA modestly elevated at 17.44 on 02/09/2021 prior to start of therapy. --Given that this is adenocarcinoma of the rectum I would recommend proceeding with neoadjuvant chemotherapy, followed by chemoradiation, followed by definitive surgery. Case discussed at [facility] --patient completed 8 cycles of neoadjuvant FOLFOX --started chemoradiation therapy with Xeloda  on 07/13/2021. Last treatment on 08/21/2021.  --last day of chemoradiation on 08/21/2021  Plan:   -- underwent LAR/DLI on 11/04/2021 at [facility] with Dr. Florie.  Underwent ileostomy reversal on 03/17/2022 --plan for CT C/A/P q 6-12 months (x 5 years) per NCCN recommendations. Last scan in Nov 2025. Next CT scan due May 2026 --continue clinic visits q 3 months with CEA q 3-6 months x 2 years, then q 6-12 months for a total of 5 years. --post-operative colonoscopy on 10/10/2023. Repeat recommended in 2028.  -- labs today show WBC 4.9, Hgb 14.4, MCV 86.7, Plt 206  --RTC 6 months with CT scan in May 2026  # AKI --Cr 1.16 today, stable --encourage hydration  # Hyperglyclemia --BG elevated during treatment, patient has no history of DM type II.  --Glucose 304 today.  -- recommend patient establish with PCP for management of high blood sugars.  --continue to monitor.   #Supportive Care -- chemotherapy education complete -- port  removed on 04/21/2022 -- no pain medication required at this time.   Orders Placed This Encounter  Procedures   Ambulatory referral to Internal Medicine    Referral Priority:   Routine    Referral Type:   Consultation    Referral Reason:   Specialty Services Required    Requested Specialty:   Internal Medicine    Number of Visits Requested:   1    All questions were answered. The patient knows to call the clinic with any problems, questions or concerns.  I have spent a total of 30 minutes minutes of face-to-face and non-face-to-face time, preparing to see the patient, obtaining and/or reviewing separately obtained history, performing a medically appropriate examination, counseling and educating the patient, ordering medications,  communicating with other health care professionals, documenting clinical information in the electronic health record, and care coordination.   Norleen IVAR Kidney, MD Department of Hematology/Oncology [facility] Phone: [phone] Pager: [phone] Email: [email or web address]   08/06/2024 2:26 PM  Hofheinz RD, Roney FALCON, Post S, Matzdorff A, Laechelt S, Hartmann JT, Mller L, Link H, Moehler M, Kettner E, Fritz E, Hieber U, Lindemann HW, Rio, Kremers S, Constantin C, Hipp M, Hartung G, Gencer D, Kienle P, [place] I, Hochhaus A. Chemoradiotherapy with capecitabine  versus fluorouracil  for locally advanced rectal cancer: a randomised, multicentre, non-inferiority, phase 3 trial.  Lancet Oncol. 2012 Jun;13(6):579-88.  "

## 2024-08-06 ENCOUNTER — Inpatient Hospital Stay: Attending: Hematology and Oncology

## 2024-08-06 ENCOUNTER — Encounter: Payer: Self-pay | Admitting: Hematology and Oncology

## 2024-08-06 ENCOUNTER — Inpatient Hospital Stay: Admitting: Hematology and Oncology

## 2024-08-06 VITALS — BP 144/76 | HR 73 | Temp 97.9°F | Resp 14 | Wt 205.2 lb

## 2024-08-06 DIAGNOSIS — Z923 Personal history of irradiation: Secondary | ICD-10-CM | POA: Insufficient documentation

## 2024-08-06 DIAGNOSIS — Z933 Colostomy status: Secondary | ICD-10-CM | POA: Diagnosis not present

## 2024-08-06 DIAGNOSIS — Z803 Family history of malignant neoplasm of breast: Secondary | ICD-10-CM | POA: Diagnosis not present

## 2024-08-06 DIAGNOSIS — R739 Hyperglycemia, unspecified: Secondary | ICD-10-CM

## 2024-08-06 DIAGNOSIS — Z0000 Encounter for general adult medical examination without abnormal findings: Secondary | ICD-10-CM | POA: Diagnosis not present

## 2024-08-06 DIAGNOSIS — Z8249 Family history of ischemic heart disease and other diseases of the circulatory system: Secondary | ICD-10-CM | POA: Insufficient documentation

## 2024-08-06 DIAGNOSIS — C20 Malignant neoplasm of rectum: Secondary | ICD-10-CM | POA: Insufficient documentation

## 2024-08-06 DIAGNOSIS — F17210 Nicotine dependence, cigarettes, uncomplicated: Secondary | ICD-10-CM | POA: Diagnosis not present

## 2024-08-06 DIAGNOSIS — Z9221 Personal history of antineoplastic chemotherapy: Secondary | ICD-10-CM | POA: Diagnosis not present

## 2024-08-06 DIAGNOSIS — Z95828 Presence of other vascular implants and grafts: Secondary | ICD-10-CM

## 2024-08-06 DIAGNOSIS — Z79899 Other long term (current) drug therapy: Secondary | ICD-10-CM | POA: Insufficient documentation

## 2024-08-06 DIAGNOSIS — Z881 Allergy status to other antibiotic agents status: Secondary | ICD-10-CM | POA: Diagnosis not present

## 2024-08-06 DIAGNOSIS — Z87442 Personal history of urinary calculi: Secondary | ICD-10-CM | POA: Insufficient documentation

## 2024-08-06 LAB — CMP (CANCER CENTER ONLY)
ALT: 19 U/L (ref 0–44)
AST: 19 U/L (ref 15–41)
Albumin: 4.4 g/dL (ref 3.5–5.0)
Alkaline Phosphatase: 109 U/L (ref 38–126)
Anion gap: 10 (ref 5–15)
BUN: 12 mg/dL (ref 8–23)
CO2: 24 mmol/L (ref 22–32)
Calcium: 9.3 mg/dL (ref 8.9–10.3)
Chloride: 103 mmol/L (ref 98–111)
Creatinine: 1.16 mg/dL (ref 0.61–1.24)
GFR, Estimated: >60 mL/min
Glucose, Bld: 304 mg/dL — ABNORMAL HIGH (ref 70–99)
Potassium: 4 mmol/L (ref 3.5–5.1)
Sodium: 138 mmol/L (ref 135–145)
Total Bilirubin: 0.3 mg/dL (ref 0.0–1.2)
Total Protein: 7.5 g/dL (ref 6.5–8.1)

## 2024-08-06 LAB — CBC WITH DIFFERENTIAL (CANCER CENTER ONLY)
Abs Immature Granulocytes: 0.01 K/uL (ref 0.00–0.07)
Basophils Absolute: 0 K/uL (ref 0.0–0.1)
Basophils Relative: 1 %
Eosinophils Absolute: 0.3 K/uL (ref 0.0–0.5)
Eosinophils Relative: 6 %
HCT: 41.7 % (ref 39.0–52.0)
Hemoglobin: 14.4 g/dL (ref 13.0–17.0)
Immature Granulocytes: 0 %
Lymphocytes Relative: 23 %
Lymphs Abs: 1.1 K/uL (ref 0.7–4.0)
MCH: 29.9 pg (ref 26.0–34.0)
MCHC: 34.5 g/dL (ref 30.0–36.0)
MCV: 86.7 fL (ref 80.0–100.0)
Monocytes Absolute: 0.3 K/uL (ref 0.1–1.0)
Monocytes Relative: 7 %
Neutro Abs: 3.1 K/uL (ref 1.7–7.7)
Neutrophils Relative %: 63 %
Platelet Count: 206 K/uL (ref 150–400)
RBC: 4.81 MIL/uL (ref 4.22–5.81)
RDW: 12.9 % (ref 11.5–15.5)
WBC Count: 4.9 K/uL (ref 4.0–10.5)
nRBC: 0 % (ref 0.0–0.2)

## 2024-08-06 LAB — CEA (ACCESS): CEA (CHCC): 3.08 ng/mL (ref 0.00–5.00)

## 2024-08-09 ENCOUNTER — Telehealth: Payer: Self-pay | Admitting: Hematology and Oncology

## 2024-08-09 NOTE — Telephone Encounter (Signed)
 I spoke to pt regarding scheduling appt. Pt has been made aware of new appt date and time.

## 2025-02-04 ENCOUNTER — Inpatient Hospital Stay: Admitting: Hematology and Oncology

## 2025-02-04 ENCOUNTER — Inpatient Hospital Stay
